# Patient Record
Sex: Female | Born: 1990 | Race: Black or African American | Hispanic: No | Marital: Married | Smoking: Never smoker
Health system: Southern US, Community
[De-identification: ages and names within clinical notes are randomized; demographics above are authoritative.]

## PROBLEM LIST (undated history)

## (undated) DIAGNOSIS — K5900 Constipation, unspecified: Secondary | ICD-10-CM

## (undated) DIAGNOSIS — G5603 Carpal tunnel syndrome, bilateral upper limbs: Secondary | ICD-10-CM

## (undated) DIAGNOSIS — G43019 Migraine without aura, intractable, without status migrainosus: Secondary | ICD-10-CM

## (undated) DIAGNOSIS — E559 Vitamin D deficiency, unspecified: Secondary | ICD-10-CM

## (undated) DIAGNOSIS — I10 Essential (primary) hypertension: Secondary | ICD-10-CM

## (undated) DIAGNOSIS — E282 Polycystic ovarian syndrome: Secondary | ICD-10-CM

## (undated) DIAGNOSIS — F419 Anxiety disorder, unspecified: Secondary | ICD-10-CM

## (undated) DIAGNOSIS — R079 Chest pain, unspecified: Secondary | ICD-10-CM

## (undated) DIAGNOSIS — R87629 Unspecified abnormal cytological findings in specimens from vagina: Secondary | ICD-10-CM

## (undated) DIAGNOSIS — K760 Fatty (change of) liver, not elsewhere classified: Secondary | ICD-10-CM

## (undated) DIAGNOSIS — R600 Localized edema: Secondary | ICD-10-CM

## (undated) DIAGNOSIS — F32A Depression, unspecified: Secondary | ICD-10-CM

## (undated) DIAGNOSIS — R7303 Prediabetes: Secondary | ICD-10-CM

## (undated) DIAGNOSIS — L309 Dermatitis, unspecified: Secondary | ICD-10-CM

## (undated) DIAGNOSIS — L509 Urticaria, unspecified: Secondary | ICD-10-CM

## (undated) DIAGNOSIS — M2550 Pain in unspecified joint: Secondary | ICD-10-CM

## (undated) DIAGNOSIS — K219 Gastro-esophageal reflux disease without esophagitis: Secondary | ICD-10-CM

## (undated) DIAGNOSIS — G4730 Sleep apnea, unspecified: Secondary | ICD-10-CM

## (undated) DIAGNOSIS — R002 Palpitations: Secondary | ICD-10-CM

## (undated) DIAGNOSIS — R0600 Dyspnea, unspecified: Secondary | ICD-10-CM

## (undated) DIAGNOSIS — F1010 Alcohol abuse, uncomplicated: Secondary | ICD-10-CM

## (undated) DIAGNOSIS — F329 Major depressive disorder, single episode, unspecified: Secondary | ICD-10-CM

## (undated) DIAGNOSIS — R632 Polyphagia: Secondary | ICD-10-CM

## (undated) DIAGNOSIS — J45909 Unspecified asthma, uncomplicated: Secondary | ICD-10-CM

## (undated) DIAGNOSIS — M549 Dorsalgia, unspecified: Secondary | ICD-10-CM

## (undated) HISTORY — DX: Polycystic ovarian syndrome: E28.2

## (undated) HISTORY — DX: Polyphagia: R63.2

## (undated) HISTORY — DX: Localized edema: R60.0

## (undated) HISTORY — DX: Alcohol abuse, uncomplicated: F10.10

## (undated) HISTORY — DX: Gastro-esophageal reflux disease without esophagitis: K21.9

## (undated) HISTORY — DX: Dyspnea, unspecified: R06.00

## (undated) HISTORY — DX: Dorsalgia, unspecified: M54.9

## (undated) HISTORY — DX: Constipation, unspecified: K59.00

## (undated) HISTORY — DX: Palpitations: R00.2

## (undated) HISTORY — DX: Sleep apnea, unspecified: G47.30

## (undated) HISTORY — DX: Vitamin D deficiency, unspecified: E55.9

## (undated) HISTORY — DX: Chest pain, unspecified: R07.9

## (undated) HISTORY — DX: Essential (primary) hypertension: I10

## (undated) HISTORY — DX: Pain in unspecified joint: M25.50

## (undated) HISTORY — DX: Unspecified asthma, uncomplicated: J45.909

## (undated) HISTORY — DX: Major depressive disorder, single episode, unspecified: F32.9

## (undated) HISTORY — DX: Migraine without aura, intractable, without status migrainosus: G43.019

## (undated) HISTORY — DX: Urticaria, unspecified: L50.9

## (undated) HISTORY — DX: Fatty (change of) liver, not elsewhere classified: K76.0

## (undated) HISTORY — DX: Prediabetes: R73.03

## (undated) HISTORY — DX: Morbid (severe) obesity due to excess calories: E66.01

## (undated) HISTORY — PX: ADENOIDECTOMY: SUR15

## (undated) HISTORY — DX: Unspecified abnormal cytological findings in specimens from vagina: R87.629

## (undated) HISTORY — DX: Carpal tunnel syndrome, bilateral upper limbs: G56.03

## (undated) HISTORY — DX: Dermatitis, unspecified: L30.9

## (undated) HISTORY — DX: Depression, unspecified: F32.A

## (undated) HISTORY — DX: Anxiety disorder, unspecified: F41.9

---
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
## 2012-11-25 ENCOUNTER — Ambulatory Visit (INDEPENDENT_AMBULATORY_CARE_PROVIDER_SITE_OTHER): Payer: BC Managed Care – PPO | Admitting: Physician Assistant

## 2012-11-25 VITALS — BP 102/80 | HR 90 | Temp 98.2°F | Resp 20 | Ht 65.0 in | Wt >= 6400 oz

## 2012-11-25 DIAGNOSIS — E282 Polycystic ovarian syndrome: Secondary | ICD-10-CM

## 2012-11-25 DIAGNOSIS — N912 Amenorrhea, unspecified: Secondary | ICD-10-CM

## 2012-11-25 DIAGNOSIS — R5383 Other fatigue: Secondary | ICD-10-CM

## 2012-11-25 DIAGNOSIS — G5603 Carpal tunnel syndrome, bilateral upper limbs: Secondary | ICD-10-CM | POA: Insufficient documentation

## 2012-11-25 DIAGNOSIS — M549 Dorsalgia, unspecified: Secondary | ICD-10-CM

## 2012-11-25 DIAGNOSIS — R5381 Other malaise: Secondary | ICD-10-CM

## 2012-11-25 LAB — POCT URINALYSIS DIPSTICK
Nitrite, UA: NEGATIVE
Protein, UA: NEGATIVE
Urobilinogen, UA: 0.2
pH, UA: 6.5

## 2012-11-25 LAB — POCT CBC
Granulocyte percent: 38.4 %G (ref 37–80)
MID (cbc): 0.5 (ref 0–0.9)
MPV: 8.7 fL (ref 0–99.8)
POC Granulocyte: 2.6 (ref 2–6.9)
POC LYMPH PERCENT: 53.6 %L — AB (ref 10–50)
Platelet Count, POC: 249 10*3/uL (ref 142–424)
RBC: 5.65 M/uL — AB (ref 4.04–5.48)
RDW, POC: 14.9 %

## 2012-11-25 LAB — POCT URINE PREGNANCY: Preg Test, Ur: NEGATIVE

## 2012-11-25 LAB — POCT UA - MICROSCOPIC ONLY
Casts, Ur, LPF, POC: NEGATIVE
Yeast, UA: NEGATIVE

## 2012-11-25 NOTE — Patient Instructions (Signed)
Please use ibuprofen 800 mg (you can take 4 of the 200 mg tablets to achieve this dose) up to 3 times daily.  Take them with food to prevent upset stomach.

## 2012-11-25 NOTE — Progress Notes (Signed)
Subjective:    Patient ID: Brandy Saunders, female    DOB: 05-17-1991, 22 y.o.   MRN: [id number]  HPI This 22 y.o. female presents for evaluation of back pain that began yesterday when she was unloading groceries.  The pain came on suddenly, causing her to cry out in pain.  Improved with rest, but then recurred while watching TV.  Took some ibuprofen, which "knocked me out."  Today it's just been achey, but she has also noticed extreme fatigue and SOB.  Mild HA. SOB noted "when I'm relaxing."  Awoke from a nap this afternoon and "felt my heart racing."  Some mild breast tenderness 3 weeks ago.  Intermittent nausea for several months, triggered by smells or tastes. None today.  Has increased her H2O intake and noticed an increase in urinary frequency.  Typically has irregular periods when not taking birth control pills. Previously took metformin for PCOS but didn't tolerate it.   Past Medical History  Diagnosis Date  . Morbidly obese   . Asthma   . Carpal tunnel syndrome, bilateral     Past Surgical History  Procedure Laterality Date  . Tonsilectomy, adenoidectomy, bilateral myringotomy and tubes  1994    Prior to Admission medications   Medication Sig Start Date End Date Taking? Authorizing Provider  ibuprofen (ADVIL,MOTRIN) 400 MG tablet Take 400 mg by mouth every 6 (six) hours as needed for pain.   Yes Historical Provider, MD    No Known Allergies  History   Social History  . Marital Status: Single    Spouse Name: n/a    Number of Children: 0  . Years of Education: 16   Occupational History  . sales    Social History Main Topics  . Smoking status: Former Games developer  . Smokeless tobacco: Never Used  . Alcohol Use: 1.8 oz/week    3 Glasses of wine per week  . Drug Use: Yes    Special: Marijuana     Comment: once every 6 months  . Sexually Active: Yes -- Female partner(s)    Birth Control/ Protection: Condom     Comment: inconsistent condom use   Other Topics  Concern  . Not on file   Social History Narrative   Lives with a roommate.  Parents live in [place], [place].    Family History  Problem Relation Age of Onset  . Hypertension Mother   . Hyperlipidemia Mother   . Stroke Father   . Diabetes Maternal Uncle   . Heart disease Maternal Grandmother       Review of Systems As above.      Objective:   Physical Exam Blood pressure 102/80, pulse 90, temperature 98.2 F (36.8 C), temperature source Oral, resp. rate 20, height 5\' 5"  (1.651 m), weight 401 lb 9.6 oz (182.165 kg), last menstrual period 08/29/2012, SpO2 100.00%. Body mass index is 66.83 kg/(m^2). Well-developed, well nourished morbidly obese BF who is awake, alert and oriented, in NAD. Very talkative, without dyspnea. HEENT: [place]/AT, PERRL, EOMI.  Sclera and conjunctiva are clear.  EAC are patent, TMs are normal in appearance. Nasal mucosa is pink and moist. OP is clear. Neck: supple, non-tender, no lymphadenopathy, thyromegaly. Heart: RRR, no murmur Lungs: normal effort, CTA Back: non-tender, FROM as allowable by body habitus. Extremities: no cyanosis, clubbing or edema. Skin: warm and dry without rash. Psychologic: good mood and appropriate affect, normal speech and behavior.   Results for orders placed in visit on 11/25/12  GLUCOSE, POCT (MANUAL RESULT  ENTRY)      Result Value Range   POC Glucose 69 (*) 70 - 99 mg/dl  POCT CBC      Result Value Range   WBC 6.8  4.6 - 10.2 K/uL   Lymph, poc 3.6 (*) 0.6 - 3.4   POC LYMPH PERCENT 53.6 (*) 10 - 50 %L   MID (cbc) 0.5  0 - 0.9   POC MID % 8.0  0 - 12 %M   POC Granulocyte 2.6  2 - 6.9   Granulocyte percent 38.4  37 - 80 %G   RBC 5.65 (*) 4.04 - 5.48 M/uL   Hemoglobin 144 (*) 12.2 - 16.2 g/dL   HCT, POC 45.4  09.8 - 47.9 %   MCV 82.2  80 - 97 fL   MCH, POC 25.5 (*) 27 - 31.2 pg   MCHC 31.0 (*) 31.8 - 35.4 g/dL   RDW, POC 11.9     Platelet Count, POC 249  142 - 424 K/uL   MPV 8.7  0 - 99.8 fL  POCT URINE PREGNANCY       Result Value Range   Preg Test, Ur Negative    POCT URINALYSIS DIPSTICK      Result Value Range   Color, UA yellow     Clarity, UA clear     Glucose, UA neg     Bilirubin, UA neg     Ketones, UA neg     Spec Grav, UA 1.020     Blood, UA neg     pH, UA 6.5     Protein, UA neg     Urobilinogen, UA 0.2     Nitrite, UA neg     Leukocytes, UA Trace    POCT UA - MICROSCOPIC ONLY      Result Value Range   WBC, Ur, HPF, POC 0-3     RBC, urine, microscopic neg     Bacteria, U Microscopic trace     Mucus, UA trace     Epithelial cells, urine per micros 1-4     Crystals, Ur, HPF, POC neg     Casts, Ur, LPF, POC neg     Yeast, UA neg     Venipuncture unsuccessful x 6.  Labs above obtained by finger stick.  Planned TSH and CMET cancelled due to inadequate sample.    Assessment & Plan:  Acute back pain - Plan: OTC ibuprofen, up to 800 mg TID with meals.  Call if this is inadequate.  Fatigue - Plan: POCT CBC, POCT urinalysis dipstick, POCT UA - Microscopic Only, CANCELED: TSH, CANCELED: Comprehensive metabolic panel; suspect an early viral illness that will progress to reveal additional symptoms over the next 2-3 days.  Amenorrhea/PCOS (polycystic ovarian syndrome) - Plan: POCT urine pregnancy, POCT glucose (manual entry).  Follow-up with GYN as planned.

## 2013-07-03 ENCOUNTER — Other Ambulatory Visit: Payer: Self-pay

## 2013-10-21 ENCOUNTER — Encounter (HOSPITAL_COMMUNITY): Payer: Self-pay | Admitting: Emergency Medicine

## 2013-10-21 ENCOUNTER — Emergency Department (HOSPITAL_COMMUNITY)
Admission: EM | Admit: 2013-10-21 | Discharge: 2013-10-21 | Disposition: A | Discharge status: Home / Self Care | Payer: BC Managed Care – PPO | Attending: Emergency Medicine | Admitting: Emergency Medicine

## 2013-10-21 DIAGNOSIS — Z79899 Other long term (current) drug therapy: Secondary | ICD-10-CM | POA: Insufficient documentation

## 2013-10-21 DIAGNOSIS — R110 Nausea: Secondary | ICD-10-CM | POA: Insufficient documentation

## 2013-10-21 DIAGNOSIS — R209 Unspecified disturbances of skin sensation: Secondary | ICD-10-CM | POA: Insufficient documentation

## 2013-10-21 DIAGNOSIS — R197 Diarrhea, unspecified: Secondary | ICD-10-CM | POA: Insufficient documentation

## 2013-10-21 DIAGNOSIS — K5900 Constipation, unspecified: Secondary | ICD-10-CM | POA: Insufficient documentation

## 2013-10-21 DIAGNOSIS — G5600 Carpal tunnel syndrome, unspecified upper limb: Secondary | ICD-10-CM

## 2013-10-21 DIAGNOSIS — J029 Acute pharyngitis, unspecified: Secondary | ICD-10-CM

## 2013-10-21 DIAGNOSIS — Z87891 Personal history of nicotine dependence: Secondary | ICD-10-CM | POA: Insufficient documentation

## 2013-10-21 DIAGNOSIS — J45909 Unspecified asthma, uncomplicated: Secondary | ICD-10-CM | POA: Insufficient documentation

## 2013-10-21 MED ORDER — HYDROCODONE-ACETAMINOPHEN 5-325 MG PO TABS: 1.0000 | ORAL_TABLET | ORAL | Status: DC | PRN

## 2013-10-21 MED ORDER — PENICILLIN G BENZATHINE 1200000 UNIT/2ML IM SUSP
1.2000 10*6.[IU] | Freq: Once | INTRAMUSCULAR | Status: AC
Start: 2013-10-21 — End: 2013-10-21
  Administered 2013-10-21: 1.2 10*6.[IU] via INTRAMUSCULAR
  Filled 2013-10-21: qty 2

## 2013-10-21 MED ORDER — PREDNISONE 20 MG PO TABS: 40.0000 mg | ORAL_TABLET | Freq: Every day | ORAL | Status: DC

## 2013-10-21 NOTE — ED Notes (Signed)
Pt c/o sore throat since yesterday. Pt states she also has R hand pain due to her carpal tunnel syndrome. Pt states she has also had nausea and dizziness.

## 2013-10-21 NOTE — ED Provider Notes (Signed)
CSN: [id number]     Arrival date & time 10/21/13  1827 History  This chart was scribed for non-physician practitioner Montine Circle PA-C working with Ephraim Hamburger, MD by Adriana Reams, ED Scribe. This patient was seen in room [facility] and the patient's care was started at [facility].  First MD Initiated Contact with Patient 10/21/13 1846     No chief complaint on file.   The history is provided by the patient. No language interpreter was used.   HPI Comments: Brandy Saunders is a 23 y.o. female who presents to the Emergency Department complaining of a few days of sudden onset, gradually worsening sore throat. She reports associated pain with swallowing, subjective fever, chills, nausea, constipation, HA, sinus pressure, and nasal congestion. She has tried Dayquil and Nyquil with little relief. She denies difficulty breathing, difficulty swallowing, coughing, vomiting or any other symptoms.  She also complains of chronic, diagnosed carpel tunnel syndrome in both of her hands that was exacerbated this morning. She states she has an appointment with her MD later this week. She reports numbness and pain shooting up her arm. She has previously tried a 5 day course of Prednisone (prescribed by her PCP) with mild relief. She has tried a sleep splint which she reports makes her pain worse.   She denies a hx of DM.   Past Medical History  Diagnosis Date  . Morbidly obese   . Asthma   . Carpal tunnel syndrome, bilateral    Past Surgical History  Procedure Laterality Date  . Tonsilectomy, adenoidectomy, bilateral myringotomy and tubes  1994   Family History  Problem Relation Age of Onset  . Hypertension Mother   . Hyperlipidemia Mother   . Stroke Father   . Diabetes Maternal Uncle   . Heart disease Maternal Grandmother    History  Substance Use Topics  . Smoking status: Former Research scientist (life sciences)  . Smokeless tobacco: Never Used  . Alcohol Use: 1.8 oz/week    3 Glasses of wine per week    OB History   Grav Para Term Preterm Abortions TAB SAB Ect Mult Living                 Review of Systems  Constitutional: Positive for chills.  HENT: Positive for congestion, sinus pressure and sore throat. Negative for trouble swallowing.   Respiratory: Negative for cough and shortness of breath.   Gastrointestinal: Positive for nausea and diarrhea. Negative for vomiting.  Neurological: Positive for numbness and headaches. Negative for weakness.    Allergies  Review of patient's allergies indicates no known allergies.  Home Medications   Current Outpatient Rx  Name  Route  Sig  Dispense  Refill  . ibuprofen (ADVIL,MOTRIN) 400 MG tablet   Oral   Take 400 mg by mouth every 6 (six) hours as needed for pain.         . Norethindrone Acetate-Ethinyl Estradiol (JUNEL 1.5/30) 1.5-30 MG-MCG tablet   Oral   Take 1 tablet by mouth daily.          There were no vitals taken for this visit.  Physical Exam  Nursing note and vitals reviewed. Constitutional: She is oriented to person, place, and time. She appears well-developed and well-nourished. No distress.  HENT:  Head: Normocephalic and atraumatic.  oropharynx mildly erythematous. No tonsillar exudates. No abscess. Uvula midline. Airway intact.   Eyes: Pupils are equal, round, and reactive to light.  Neck: Normal range of motion.  Cardiovascular: Normal rate, regular  rhythm, normal heart sounds and intact distal pulses.   Pulmonary/Chest: Effort normal and breath sounds normal. No respiratory distress. She has no wheezes. She has no rales. She exhibits no tenderness.  Abdominal: Normal appearance. She exhibits no distension.  Musculoskeletal: Normal range of motion.  Positive Phalen and Tinel signs characteristic of carpel tunnel. No bony abnormality or deformity. ROM, strength and sensation intact.   Neurological: She is alert and oriented to person, place, and time. No cranial nerve deficit.  Skin: Skin is warm and dry. No  rash noted.  Psychiatric: She has a normal mood and affect. Her behavior is normal.    ED Course  Procedures (including critical care time) COORDINATION OF CARE: 6:52 PM Discussed treatment plan which includes Prednisone and Penicillin with pt at bedside and pt agreed to plan. Advised pt to try ice massage. Advised pt to follow up with hand specialist, referral given. Will discharge home with Vicodin.    Labs Review Labs Reviewed - No data to display Imaging Review No results found.  EKG Interpretation   None       MDM   Final diagnoses:  Sore throat  Carpal tunnel syndrome   Patient with sore throat. Possibly strep. Treated in ED. No abscess. Discharged home.  I personally performed the services described in this documentation, which was scribed in my presence. The recorded information has been reviewed and is accurate.     Montine Circle, PA-C 10/21/13 [phone]

## 2013-10-21 NOTE — Discharge Instructions (Signed)
Carpal Tunnel Syndrome The carpal tunnel is a narrow area located on the palm side of your wrist. The tunnel is formed by the wrist bones and ligaments. Nerves, blood vessels, and tendons pass through the carpal tunnel. Repeated wrist motion or certain diseases may cause swelling within the tunnel. This swelling pinches the main nerve in the wrist (median nerve) and causes the painful hand and arm condition called carpal tunnel syndrome. CAUSES   Repeated wrist motions.  Wrist injuries.  Certain diseases like arthritis, diabetes, alcoholism, hyperthyroidism, and kidney failure.  Obesity.  Pregnancy. SYMPTOMS   A "pins and needles" feeling in your fingers or hand.  Tingling or numbness in your fingers or hand.  An aching feeling in your entire arm.  Wrist pain that goes up your arm to your shoulder.  Pain that goes down into your palm or fingers.  A weak feeling in your hands. DIAGNOSIS  Your caregiver will take your history and perform a physical exam. An electromyography test may be needed. This test measures electrical signals sent out by the muscles. The electrical signals are usually slowed by carpal tunnel syndrome. You may also need X-rays. TREATMENT  Carpal tunnel syndrome may clear up by itself. Your caregiver may recommend a wrist splint or medicine such as a nonsteroidal anti-inflammatory medicine. Cortisone injections may help. Sometimes, surgery may be needed to free the pinched nerve.  HOME CARE INSTRUCTIONS   Take all medicine as directed by your caregiver. Only take over-the-counter or prescription medicines for pain, discomfort, or fever as directed by your caregiver.  If you were given a splint to keep your wrist from bending, wear it as directed. It is important to wear the splint at night. Wear the splint for as long as you have pain or numbness in your hand, arm, or wrist. This may take 1 to 2 months.  Rest your wrist from any activity that may be causing your  pain. If your symptoms are work-related, you may need to talk to your employer about changing to a job that does not require using your wrist.  Put ice on your wrist after long periods of wrist activity.  Put ice in a plastic bag.  Place a towel between your skin and the bag.  Leave the ice on for 15-20 minutes, 03-04 times a day.  Keep all follow-up visits as directed by your caregiver. This includes any orthopedic referrals, physical therapy, and rehabilitation. Any delay in getting necessary care could result in a delay or failure of your condition to heal. SEEK IMMEDIATE MEDICAL CARE IF:   You have new, unexplained symptoms.  Your symptoms get worse and are not helped or controlled with medicines. MAKE SURE YOU:   Understand these instructions.  Will watch your condition.  Will get help right away if you are not doing well or get worse. Document Released: 08/11/2000 Document Revised: 11/06/2011 Document Reviewed: 06/30/2011 [facility] Patient Information 2014 [place], [place]. Sore Throat A sore throat is pain, burning, irritation, or scratchiness of the throat. There is often pain or tenderness when swallowing or talking. A sore throat may be accompanied by other symptoms, such as coughing, sneezing, fever, and swollen neck glands. A sore throat is often the first sign of another sickness, such as a cold, flu, strep throat, or mononucleosis (commonly known as mono). Most sore throats go away without medical treatment. CAUSES  The most common causes of a sore throat include:  A viral infection, such as a cold, flu, or mono.  A bacterial infection, such as strep throat, tonsillitis, or whooping cough.  Seasonal allergies.  Dryness in the air.  Irritants, such as smoke or pollution.  Gastroesophageal reflux disease (GERD). HOME CARE INSTRUCTIONS   Only take over-the-counter medicines as directed by your caregiver.  Drink enough fluids to keep your urine clear or pale  yellow.  Rest as needed.  Try using throat sprays, lozenges, or sucking on hard candy to ease any pain (if older than 4 years or as directed).  Sip warm liquids, such as broth, herbal tea, or warm water with honey to relieve pain temporarily. You may also eat or drink cold or frozen liquids such as frozen ice pops.  Gargle with salt water (mix 1 tsp salt with 8 oz of water).  Do not smoke and avoid secondhand smoke.  Put a cool-mist humidifier in your bedroom at night to moisten the air. You can also turn on a hot shower and sit in the bathroom with the door closed for 5 10 minutes. SEEK IMMEDIATE MEDICAL CARE IF:  You have difficulty breathing.  You are unable to swallow fluids, soft foods, or your saliva.  You have increased swelling in the throat.  Your sore throat does not get better in 7 days.  You have nausea and vomiting.  You have a fever or persistent symptoms for more than 2 3 days.  You have a fever and your symptoms suddenly get worse. MAKE SURE YOU:   Understand these instructions.  Will watch your condition.  Will get help right away if you are not doing well or get worse. Document Released: 09/21/2004 Document Revised: 07/31/2012 Document Reviewed: 04/21/2012 [facility] Patient Information 2014 [place], [place].

## 2013-10-22 NOTE — ED Provider Notes (Signed)
Medical screening examination/treatment/procedure(s) were performed by non-physician practitioner and as supervising physician I was immediately available for consultation/collaboration.  EKG Interpretation   None         Xzander Gilham T Magaret Justo, MD 10/22/13 0004 

## 2014-07-30 ENCOUNTER — Ambulatory Visit (INDEPENDENT_AMBULATORY_CARE_PROVIDER_SITE_OTHER): Payer: BC Managed Care – PPO | Admitting: Medical

## 2014-07-30 ENCOUNTER — Encounter: Payer: Self-pay | Admitting: Medical

## 2014-07-30 VITALS — BP 150/95 | HR 102 | Temp 98.1°F | Ht 65.5 in | Wt >= 6400 oz

## 2014-07-30 DIAGNOSIS — R197 Diarrhea, unspecified: Secondary | ICD-10-CM

## 2014-07-30 DIAGNOSIS — R1084 Generalized abdominal pain: Secondary | ICD-10-CM

## 2014-07-30 DIAGNOSIS — R030 Elevated blood-pressure reading, without diagnosis of hypertension: Secondary | ICD-10-CM

## 2014-07-30 DIAGNOSIS — N910 Primary amenorrhea: Secondary | ICD-10-CM

## 2014-07-30 DIAGNOSIS — N926 Irregular menstruation, unspecified: Secondary | ICD-10-CM

## 2014-07-30 DIAGNOSIS — K589 Irritable bowel syndrome without diarrhea: Secondary | ICD-10-CM

## 2014-07-30 DIAGNOSIS — IMO0001 Reserved for inherently not codable concepts without codable children: Secondary | ICD-10-CM

## 2014-07-30 DIAGNOSIS — J45909 Unspecified asthma, uncomplicated: Secondary | ICD-10-CM

## 2014-07-30 LAB — CBC WITH DIFFERENTIAL/PLATELET
BASOS ABS: 0 10*3/uL (ref 0.0–0.1)
Basophils Relative: 0.4 % (ref 0.0–3.0)
EOS ABS: 0.1 10*3/uL (ref 0.0–0.7)
Eosinophils Relative: 1.7 % (ref 0.0–5.0)
HCT: 38.5 % (ref 36.0–46.0)
Hemoglobin: 12.5 g/dL (ref 12.0–15.0)
LYMPHS ABS: 2.2 10*3/uL (ref 0.7–4.0)
LYMPHS PCT: 45.1 % (ref 12.0–46.0)
MCHC: 32.4 g/dL (ref 30.0–36.0)
MCV: 74.9 fl — ABNORMAL LOW (ref 78.0–100.0)
Monocytes Absolute: 0.2 10*3/uL (ref 0.1–1.0)
Monocytes Relative: 5 % (ref 3.0–12.0)
NEUTROS PCT: 47.8 % (ref 43.0–77.0)
Neutro Abs: 2.3 10*3/uL (ref 1.4–7.7)
Platelets: 276 10*3/uL (ref 150.0–400.0)
RBC: 5.14 Mil/uL — ABNORMAL HIGH (ref 3.87–5.11)
RDW: 15.4 % (ref 11.5–15.5)
WBC: 4.9 10*3/uL (ref 4.0–10.5)

## 2014-07-30 LAB — H. PYLORI ANTIBODY, IGG: H PYLORI IGG: NEGATIVE

## 2014-07-30 LAB — POCT URINE PREGNANCY: PREG TEST UR: NEGATIVE

## 2014-07-30 NOTE — Patient Instructions (Signed)
Get a wrist cuff otc and check bp daily. Give [place] call in 1 wk with those values. If any cardiac or neuro signs/symptoms then ED eval.  For your abdomen pain and varying loose/constipated stools will advise. Metamucil 1 round tablespoon in 8 oz water 3 times a day.  Get labs today.  Follow up in 2 wks bp check and bring in your wrist monitor.

## 2014-07-30 NOTE — Assessment & Plan Note (Signed)
Pt arm is so large that the large cuff did not fit around her arm. I checked her bp with large cuff around forearm. I got 150/90. Difficult to check. Get a wrist cuff otc and check bp daily. Give [place] call in 1 wk with those values. If any cardiac or neuro signs/symptoms then ED eval.  Follow up in 2 wks bp check and bring in your wrist monitor.

## 2014-07-30 NOTE — Assessment & Plan Note (Signed)
1st visit here Document today work up and consider plan weight loss in future.

## 2014-07-30 NOTE — Progress Notes (Signed)
Pre visit review using our clinic review tool, if applicable. No additional management support is needed unless otherwise documented below in the visit note. 

## 2014-07-30 NOTE — Assessment & Plan Note (Signed)
Stable for years.

## 2014-07-30 NOTE — Progress Notes (Signed)
Subjective:    Patient ID: Brandy Saunders, female    DOB: 1991/03/13, 23 y.o.   MRN: [id number]  HPI   I have reviewed pt PMH, PSH, FH, Social History and Surgical History  Customer care call center, cup coffee a day. NO exercise, single. Bachelors of science.  Pt here for first time. Pt states her bp are controlled with other providers. Pt has some occasional HA and thinks this is related to her computer work. No chest pain or other neurologic type symptoms other than rare occasional faint ha.  Asthma controlled last problem with that was since childhood.  Pt just describes mild abdominal cramps on and off. Loose stools about 1/2 hour after eating.(thentother times  mild constipated) This has been going on since September. Hx of mild lactose intolerance. Some nausea with this occasionally  but usually never vomited. Did vomit one time last week. Occasional heartburn. None presently. LMP- June. Always irregular. Her pregnancy test was negative.  Past Medical History  Diagnosis Date  . Morbidly obese   . Asthma   . Carpal tunnel syndrome, bilateral     History   Social History  . Marital Status: Single    Spouse Name: n/a    Number of Children: 0  . Years of Education: 16   Occupational History  . sales    Social History Main Topics  . Smoking status: Former Research scientist (life sciences)  . Smokeless tobacco: Never Used  . Alcohol Use: 1.8 oz/week    3 Glasses of wine per week  . Drug Use: Yes    Special: Marijuana     Comment: once every 6 months  . Sexual Activity:    Partners: Male    Birth Control/ Protection: Condom     Comment: inconsistent condom use   Other Topics Concern  . Not on file   Social History Narrative   Lives with a roommate.  Parents live in [place], [place].    Past Surgical History  Procedure Laterality Date  . Tonsilectomy, adenoidectomy, bilateral myringotomy and tubes  1994    Family History  Problem Relation Age of Onset  . Hypertension Mother   .  Hyperlipidemia Mother   . Stroke Father   . Diabetes Maternal Uncle   . Heart disease Maternal Grandmother     No Known Allergies  Current Outpatient Prescriptions on File Prior to Visit  Medication Sig Dispense Refill  . ibuprofen (ADVIL,MOTRIN) 400 MG tablet Take 400 mg by mouth every 6 (six) hours as needed for pain.     No current facility-administered medications on file prior to visit.    BP 150/95 mmHg  Pulse 102  Temp(Src) 98.1 F (36.7 C) (Oral)  Ht 5' 5.5" (1.664 m)  Wt 439 lb (199.129 kg)  BMI 71.92 kg/m2  SpO2 100%  LMP 01/28/2014        Review of Systems See hipi    Objective:   Physical Exam   General Appearance- Not in acute distress.  HEENT Eyes- Scleraeral/Conjuntiva-bilat- Not Yellow. Mouth & Throat- Normal.  Chest and Lung Exam Auscultation: Breath sounds:-Normal. Adventitious sounds:- No Adventitious sounds.  Cardiovascular Auscultation:Rythm - Regular. Heart Sounds -Normal heart sounds.  Abdomen Inspection:-Inspection Normal.  Palpation/Perucssion: Palpation and Percussion of the abdomen reveal- Non Tender, No Rebound tenderness, No rigidity(Guarding) and No Palpable abdominal masses.  Liver:-Normal.  Spleen:- Normal.    Neurologic Cranial Nerve exam:- CN III-XII intact(No nystagmus), symmetric smile. Drift Test:- No drift. Romberg Exam:- Negative.  Finger to Nose:-  Normal/Intact Strength:- 5/5 equal and symmetric strength both upper and lower extremities.         Assessment & Plan:

## 2014-07-30 NOTE — Assessment & Plan Note (Signed)
For your abdomen pain and varying loose/constipated stools will advise. Metamucil 1 round tablespoon in 8 oz water 3 times a day.  Get labs today.  Expand work up  If labs negative and pt symptoms persist.

## 2014-07-30 NOTE — Assessment & Plan Note (Signed)
Pregnancy neg. Likely pcos related.

## 2014-08-01 LAB — COMPREHENSIVE METABOLIC PANEL
ALT: 52 U/L — AB (ref 0–35)
AST: 38 U/L — ABNORMAL HIGH (ref 0–37)
Albumin: 3.9 g/dL (ref 3.5–5.2)
Alkaline Phosphatase: 60 U/L (ref 39–117)
BILIRUBIN TOTAL: 0.6 mg/dL (ref 0.2–1.2)
BUN: 10 mg/dL (ref 6–23)
CO2: 25 mEq/L (ref 19–32)
Calcium: 8.5 mg/dL (ref 8.4–10.5)
Chloride: 103 mEq/L (ref 96–112)
Creatinine, Ser: 0.6 mg/dL (ref 0.4–1.2)
GFR: 148.89 mL/min (ref >60.00)
Glucose, Bld: 93 mg/dL (ref 70–99)
Potassium: 4.3 mEq/L (ref 3.5–5.1)
Sodium: 139 mEq/L (ref 135–145)
TOTAL PROTEIN: 7.3 g/dL (ref 6.0–8.3)

## 2014-08-13 ENCOUNTER — Ambulatory Visit: Payer: BC Managed Care – PPO | Admitting: Medical

## 2014-08-14 LAB — POC HEMOCCULT BLD/STL (HOME/3-CARD/SCREEN)
FECAL OCCULT BLD: NEGATIVE
FECAL OCCULT BLD: NEGATIVE
Fecal Occult Blood, POC: NEGATIVE

## 2014-08-14 NOTE — Addendum Note (Signed)
Addended by: Peggyann Shoals on: 08/14/2014 03:11 PM   Modules accepted: Orders

## 2014-08-17 LAB — OVA AND PARASITE EXAMINATION: OP: NONE SEEN

## 2014-08-25 LAB — CLOSTRIDIUM DIFFICILE CULTURE-FECAL

## 2014-08-27 ENCOUNTER — Telehealth: Payer: Self-pay | Admitting: Medical

## 2014-08-27 ENCOUNTER — Encounter: Payer: Self-pay | Admitting: Physician Assistant

## 2014-08-27 DIAGNOSIS — R195 Other fecal abnormalities: Secondary | ICD-10-CM

## 2014-08-27 DIAGNOSIS — R109 Unspecified abdominal pain: Secondary | ICD-10-CM

## 2014-08-27 NOTE — Telephone Encounter (Signed)
error:[id number] ° °

## 2014-08-27 NOTE — Telephone Encounter (Signed)
Referal to GI for abdominal cramps. Loose stools and yeast on stool studies.

## 2014-09-04 ENCOUNTER — Encounter: Payer: Self-pay | Admitting: Physician Assistant

## 2014-09-04 ENCOUNTER — Ambulatory Visit (INDEPENDENT_AMBULATORY_CARE_PROVIDER_SITE_OTHER): Payer: BLUE CROSS/BLUE SHIELD | Admitting: Physician Assistant

## 2014-09-04 ENCOUNTER — Other Ambulatory Visit (INDEPENDENT_AMBULATORY_CARE_PROVIDER_SITE_OTHER): Payer: BLUE CROSS/BLUE SHIELD

## 2014-09-04 VITALS — BP 130/70 | HR 80 | Ht 65.5 in | Wt >= 6400 oz

## 2014-09-04 DIAGNOSIS — R945 Abnormal results of liver function studies: Secondary | ICD-10-CM

## 2014-09-04 DIAGNOSIS — K219 Gastro-esophageal reflux disease without esophagitis: Secondary | ICD-10-CM

## 2014-09-04 DIAGNOSIS — K589 Irritable bowel syndrome without diarrhea: Secondary | ICD-10-CM

## 2014-09-04 DIAGNOSIS — R197 Diarrhea, unspecified: Secondary | ICD-10-CM

## 2014-09-04 DIAGNOSIS — R7989 Other specified abnormal findings of blood chemistry: Secondary | ICD-10-CM

## 2014-09-04 LAB — CBC WITH DIFFERENTIAL/PLATELET
Basophils Absolute: 0 K/uL (ref 0.0–0.1)
Basophils Relative: 0.8 % (ref 0.0–3.0)
Eosinophils Absolute: 0.3 K/uL (ref 0.0–0.7)
Eosinophils Relative: 6.4 % — ABNORMAL HIGH (ref 0.0–5.0)
HCT: 39.3 % (ref 36.0–46.0)
Hemoglobin: 12.9 g/dL (ref 12.0–15.0)
Lymphocytes Relative: 48.9 % — ABNORMAL HIGH (ref 12.0–46.0)
Lymphs Abs: 2.3 K/uL (ref 0.7–4.0)
MCHC: 32.8 g/dL (ref 30.0–36.0)
MCV: 75 fl — ABNORMAL LOW (ref 78.0–100.0)
Monocytes Absolute: 0.2 K/uL (ref 0.1–1.0)
Monocytes Relative: 4.8 % (ref 3.0–12.0)
Neutro Abs: 1.9 K/uL (ref 1.4–7.7)
Neutrophils Relative %: 39.1 % — ABNORMAL LOW (ref 43.0–77.0)
Platelets: 271 K/uL (ref 150.0–400.0)
RBC: 5.24 Mil/uL — ABNORMAL HIGH (ref 3.87–5.11)
RDW: 15.8 % — ABNORMAL HIGH (ref 11.5–15.5)
WBC: 4.8 K/uL (ref 4.0–10.5)

## 2014-09-04 LAB — COMPREHENSIVE METABOLIC PANEL WITH GFR
ALT: 50 U/L — ABNORMAL HIGH (ref 0–35)
AST: 34 U/L (ref 0–37)
Albumin: 3.9 g/dL (ref 3.5–5.2)
Alkaline Phosphatase: 61 U/L (ref 39–117)
BUN: 12 mg/dL (ref 6–23)
CO2: 29 meq/L (ref 19–32)
Calcium: 8.7 mg/dL (ref 8.4–10.5)
Chloride: 105 meq/L (ref 96–112)
Creatinine, Ser: 0.7 mg/dL (ref 0.4–1.2)
GFR: 136.22 mL/min
Glucose, Bld: 95 mg/dL (ref 70–99)
Potassium: 4.5 meq/L (ref 3.5–5.1)
Sodium: 139 meq/L (ref 135–145)
Total Bilirubin: 0.4 mg/dL (ref 0.2–1.2)
Total Protein: 7.7 g/dL (ref 6.0–8.3)

## 2014-09-04 LAB — AMYLASE: AMYLASE: 25 U/L — AB (ref 27–131)

## 2014-09-04 LAB — LIPASE: Lipase: 34 U/L (ref 11.0–59.0)

## 2014-09-04 MED ORDER — PANTOPRAZOLE SODIUM 40 MG PO TBEC: DELAYED_RELEASE_TABLET | ORAL | Status: DC

## 2014-09-04 MED ORDER — DICYCLOMINE HCL 20 MG PO TABS: 20.0000 mg | ORAL_TABLET | Freq: Three times a day (TID) | ORAL | Status: DC

## 2014-09-04 NOTE — Progress Notes (Signed)
Patient ID: [name], female   DOB: July 17, 1991, 24 y.o.   MRN: [id number]    HPI:    Brandy Saunders is a 24 year old female referred for evaluation by Brandy Pai, PA-C due to diarrhea and GERD.  Patient states that since September 2015 she has been having postprandial cramping and the need to have a bowel movement. She reports that she will eat, developed lower abdominal cramping, and have a bowel movement, sometimes formed and sometimes loose. She is unable to identify any specific foods that intensify her symptoms and she has not been able to identify anything that alleviates her symptoms. Her lower abdominal pain is described as crampy in nature and about a 5 on a scale of 1-10 in intensity. Her pain is better when she has a bowel movement. She typically has days of formed stools alternating with days of loose stools. She states she is puzzled by her new symptoms because she has not changed her eating habits. She reports that on Thanksgiving she had her regular Thanksgiving dinner and later that night he had pizza and wine and subsequently developed cramping and diarrhea. She reports that her stools have been oily. She has had no bright red blood per rectum or melena. Her appetite is good and there is been no weight loss. There is no known family history of colon cancer, colon polyps, or inflammatory bowel disease.  She also reports that for the past several months she has been getting heartburn for 5 times per day. Her heartburn seems to be worse if she has tomatoes, tomato sauce, pizza, or citrus products. She has been waking up at night because she feels mouthfuls of regurgitation coming up and feels like she is choking. She has a bitter taste in her mouth every morning. She has tried using baking soda and water with little relief of her symptoms. She has also tried toms with little relief. She admits that she has an Affinity for chocolate, fried food, and ice cream and acknowledges  that these all make her heartburn worse. Dietary review reveals that the patient typically has something from [place] for breakfast, chicken tenders and fries for lunch, and her suppe's vary from day to day. She has no dysphagia, epigastric pain, nausea, or vomiting. She was recently evaluated at her primary care physician's office and noted to have elevated transaminases as well.   Past Medical History  Diagnosis Date  . Morbidly obese   . Asthma   . Carpal tunnel syndrome, bilateral     Past Surgical History  Procedure Laterality Date  . Tonsilectomy, adenoidectomy, bilateral myringotomy and tubes  1994   Family History  Problem Relation Age of Onset  . Hypertension Mother   . Hyperlipidemia Mother   . Stroke Father   . Diabetes Maternal Uncle   . Heart disease Maternal Grandmother   . Colon cancer Neg Hx    History  Substance Use Topics  . Smoking status: Former Smoker    Types: Cigarettes  . Smokeless tobacco: Never Used  . Alcohol Use: 1.8 oz/week    3 Glasses of wine per week   Current Outpatient Prescriptions  Medication Sig Dispense Refill  . ibuprofen (ADVIL,MOTRIN) 400 MG tablet Take 400 mg by mouth every 6 (six) hours as needed for pain.    . Melatonin 3 MG CAPS Take 3 mg by mouth at bedtime as needed.    Marland Kitchen PE-DM-APAP & Doxylamin-DM-APAP (LIQUID) MISC Take by mouth. PRN currently for cold. Also doing  the Nyquil liquid.    Marland Kitchen dicyclomine (BENTYL) 20 MG tablet Take 1 tablet (20 mg total) by mouth 3 (three) times daily before meals. 90 tablet 1  . pantoprazole (PROTONIX) 40 MG tablet Take 1 tab 30 min before breakfast 30 tablet 3   No current facility-administered medications for this visit.   No Known Allergies   Review of Systems: Gen: Denies any fever, chills, sweats, anorexia, fatigue, weakness, malaise, weight loss, and sleep disorder CV: Denies chest pain, angina, palpitations, syncope, orthopnea, PND, peripheral edema, and claudication. Resp: Has some  dyspnea with exertion. GI: Denies vomiting blood, jaundice, and fecal incontinence.   Denies dysphagia or odynophagia. GU : Denies urinary burning, blood in urine, urinary frequency, urinary hesitancy, nocturnal urination, and urinary incontinence. MS: Denies joint pain, limitation of movement, and swelling, stiffness, low back pain, extremity pain. Denies muscle weakness, cramps, atrophy.  Derm: Denies rash, itching, dry skin, hives, moles, warts, or unhealing ulcers.  Psych: Denies depression, anxiety, memory loss, suicidal ideation, hallucinations, paranoia, and confusion. Heme: Denies bruising, bleeding, and enlarged lymph nodes. Neuro:  Denies any headaches, dizziness, paresthesias. Endo:  Denies any problems with DM, thyroid, adrenal function   LAB RESULTS: CBC on 07/30/2014 had a white blood cell count 4.9, hemoglobin 12.5, hematocrit 38.5, platelets 276,000. MCV 74.9. MS 3 profile on 07/30/2014 showed an alkaline phosphatase of 60, albumin 3.9, AST 38, ALT 52, total protein 7.3, total bili 0.6. Stool for fecal occult blood on 08/12/2014 was negative 3.    Physical Exam: BP 130/70 mmHg  Pulse 80  Ht 5' 5.5" (1.664 m)  Wt 433 lb 3.2 oz (196.498 kg)  BMI 70.97 kg/m2 Constitutional: Pleasant,well-developed, obese, AA female in no acute distress. HEENT: Normocephalic and atraumatic. Conjunctivae are normal. No scleral icterus. Neck supple. No thyrpmegaly Cardiovascular: Normal rate, regular rhythm.  Pulmonary/chest: Effort normal and breath sounds normal. No wheezing, rales or rhonchi. Abdominal: Soft, nondistended, nontender. Bowel sounds active throughout. There are no masses palpable. No hepatomegaly. Extremities: no edema Lymphadenopathy: No cervical adenopathy noted. Neurological: Alert and oriented to person place and time. Skin: Skin is warm and dry. No rashes noted. Psychiatric: Normal mood and affect. Behavior is normal.  ASSESSMENT AND PLAN: #1 GERD. An antireflux  regimen has been reviewed. She will be given a trial of pantoprazole 40 mg by mouth every morning 30 minutes prior to breakfast.  #2. IBS/diarrhea. Her symptoms of postprandial cramping followed by defecation with relief of her discomfort with defecation are suggestive of IBS. He spent a considerable amount of time reviewing a high-fiber low-fat diet. She will try Benefiber 1 heaping tablespoon daily along with Bentyl 20 mg 1 by mouth 3 times a day when necessary cramping. Repeat CBC, comprehensive metabolic panel, amylase, lipase, celiac panel, GI pathogen panel and fecal fat will be obtained. If her blood work is nonrevealing and she has no relief of her discomfort with dietary changes and use of antispasmodics, she will be considered for colonoscopy.  #3. Elevated AST and ALT. These are likely due to fatty liver. The patient does admit to having a glass of wine daily but states she can discontinue this practice at any time. An ultrasound of the abdomen will be obtained to evaluate for fatty liver.  Patient will follow up in 6 weeks, sooner if needed.    Sharilynn Cassity, Vita Barley PA-C 09/04/2014, 11:27 AM

## 2014-09-04 NOTE — Patient Instructions (Addendum)
Please go to the basement level to have your labs drawn and stool study. We sent prescriptions to [facility]. 1. Pantoprazole sodium 40 mg. 2. Bentyl ( Dicyclomine) 20 mg.  You have been scheduled for an abdominal ultrasound at [facility] (1st floor of hospital) on Tuesday 09-08-2014 at 8:00 . Please arrive at 7:45 am  prior to your appointment for registration. Make certain not to have anything to eat or drink 6 hours prior to your appointment. Should you need to reschedule your appointment, please contact radiology at [phone]. This test typically takes about 30 minutes to perform.  We have given  You information on Anti-reflux, High fiber diet, Low fat diet.  Get Benefiber at [facility] or [facility].  Take 1 heaping tablesppon in water or juice daily.  Call us end of January to see if Lori's schedule is in yet.  If it is make an appointment to see Cecille Rubin week of 2-15 or week of 2-22.

## 2014-09-04 NOTE — Progress Notes (Signed)
I agree with the above note, plan 

## 2014-09-08 ENCOUNTER — Other Ambulatory Visit: Payer: Self-pay | Admitting: Physician Assistant

## 2014-09-08 ENCOUNTER — Other Ambulatory Visit: Payer: Self-pay | Admitting: *Deleted

## 2014-09-08 ENCOUNTER — Other Ambulatory Visit: Payer: BLUE CROSS/BLUE SHIELD

## 2014-09-08 ENCOUNTER — Ambulatory Visit (HOSPITAL_COMMUNITY)
Admission: RE | Admit: 2014-09-08 | Discharge: 2014-09-08 | Disposition: A | Discharge status: Home / Self Care | Payer: BLUE CROSS/BLUE SHIELD | Source: Ambulatory Visit | Attending: Physician Assistant | Admitting: Physician Assistant

## 2014-09-08 DIAGNOSIS — R945 Abnormal results of liver function studies: Secondary | ICD-10-CM | POA: Insufficient documentation

## 2014-09-08 DIAGNOSIS — K760 Fatty (change of) liver, not elsewhere classified: Secondary | ICD-10-CM

## 2014-09-08 DIAGNOSIS — R7989 Other specified abnormal findings of blood chemistry: Secondary | ICD-10-CM

## 2014-09-08 DIAGNOSIS — R197 Diarrhea, unspecified: Secondary | ICD-10-CM

## 2014-09-08 DIAGNOSIS — K219 Gastro-esophageal reflux disease without esophagitis: Secondary | ICD-10-CM

## 2014-09-08 DIAGNOSIS — K589 Irritable bowel syndrome without diarrhea: Secondary | ICD-10-CM

## 2014-09-10 LAB — FECAL LACTOFERRIN, QUANT: Lactoferrin: NEGATIVE

## 2014-09-11 LAB — GASTROINTESTINAL PATHOGEN PANEL PCR
C. DIFFICILE TOX A/B, PCR: NEGATIVE
CRYPTOSPORIDIUM, PCR: NEGATIVE
Campylobacter, PCR: NEGATIVE
E COLI 0157, PCR: NEGATIVE
E coli (ETEC) LT/ST PCR: NEGATIVE
E coli (STEC) stx1/stx2, PCR: NEGATIVE
GIARDIA LAMBLIA, PCR: NEGATIVE
NOROVIRUS, PCR: NEGATIVE
Rotavirus A, PCR: NEGATIVE
Salmonella, PCR: NEGATIVE
Shigella, PCR: NEGATIVE

## 2014-09-17 ENCOUNTER — Encounter: Payer: BLUE CROSS/BLUE SHIELD | Attending: Physician Assistant | Admitting: Dietician

## 2014-09-17 ENCOUNTER — Encounter: Payer: Self-pay | Admitting: Dietician

## 2014-09-17 VITALS — Ht 66.0 in | Wt >= 6400 oz

## 2014-09-17 DIAGNOSIS — Z6841 Body Mass Index (BMI) 40.0 and over, adult: Secondary | ICD-10-CM | POA: Diagnosis not present

## 2014-09-17 DIAGNOSIS — E282 Polycystic ovarian syndrome: Secondary | ICD-10-CM

## 2014-09-17 DIAGNOSIS — K589 Irritable bowel syndrome without diarrhea: Secondary | ICD-10-CM | POA: Insufficient documentation

## 2014-09-17 DIAGNOSIS — K760 Fatty (change of) liver, not elsewhere classified: Secondary | ICD-10-CM | POA: Diagnosis not present

## 2014-09-17 LAB — FECAL FAT, QUANTITATIVE

## 2014-09-17 NOTE — Progress Notes (Signed)
Medical Nutrition Therapy:  Appt start time: 0900 end time:  1000.   Assessment:  Primary concerns today: Patient wants to see if dietary habits are contributing to her stomach problems.  Her focus is not weight control but health. Fatty liver per MD.  Morbidly obese and hx of PCOS.  Patient was put on Metformin for PCOS but did not like the side effects.  Weight was more steady until new job a year ago and sits a lot.  Patient has noted weight gain.  There is a gym on work site and also has a Building services engineer and also another Building services engineer but does not use.  Knows she should but motivation is difficult.  Boyfriend has diabetes and is somewhat supportive, is very accepting of her size but does not understand always the need for her to change her eating habits.  Patient states that she is an emotional eater.  There are always a lot of snacks at the office.  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  Patient is here alone.    Patient states that she is lactose intolerant.  Has changed to [place] but uses other dairy despite the side effects.  Her heartburn and diarrhea have improved and patient did not take the prescribed meds due to never making time to get to the pharmacy.  She has decreased the fat in the diet as well which has had a positive outcome on her GI symptoms.    Preferred Learning Style:   No preference indicated   Learning Readiness:   Contemplating  Ready  Change in progress   MEDICATIONS: see list   DIETARY INTAKE:  Eats a lot of dairy.  Increased dairy causes diarrhea, gas,-  Patient states that she is lactose intolerant.  Patient has changed to Almond milk and continues to eat other dairy items despite symptoms.  24-hr recall: up until 1 month ago B ( 10 or 11AM):  [place] or pasta or fried chicken at [employer] Snk ( AM):   L ( PM):  Snk ( PM): fruit or other things at work D ( PM): [place], or fast food or other  take out Snk ( PM): popcorn or chips or fruit or granola when she gets home. Beverages: soda, juice, wine (prior to the New Year and none since) Patient has been drinking more water  In the past month has started preparing more food at home and trying to eat more healthfully.  Usual physical activity: none  Estimated energy needs: 1600 calories 180 g carbohydrates 100 g protein 53 g fat  Progress Towards Goal(s):  In progress.   Nutritional Diagnosis:  NB-1.1 Food and nutrition-related knowledge deficit As related to balance of carbohydrates, protein, and fat.  As evidenced by diet hx.    Intervention:  Nutrition Counseling.  Discussed being more mindful of eating habits, effects of nutrition on liver, PCOS and health.  Suggested a more whole foods/low glycemic diet.  Continue the Almond milk.  -gave non dairy options for cooking.   Consider exercise.  Walking 15 minutes 3-4 x per week and go from there. Rethink your drinks.  Great job with the changes made! Think about how you feel when snacking. (am I hungry?)  Teaching Method Utilized:  Visual Auditory Hands on  Handouts given during visit include:  Yellow carbohydrate card  Label reading  Reference for Mindless Eating book  Barriers to learning/adherence to lifestyle change: motivation/support.  Demonstrated degree of understanding via:  Teach Back   Monitoring/Evaluation:  Dietary intake, exercise, label reading, and body weight in 1 month(s). \

## 2014-09-17 NOTE — Patient Instructions (Signed)
Continue the Almond milk.   Consider exercise.  Walking 15 minutes 3-4 x per week and go from there. Rethink your drinks.  Great job with the changes made! Think about how you feel when snacking. (am I hungry?)

## 2014-09-22 ENCOUNTER — Other Ambulatory Visit: Payer: Self-pay

## 2014-09-22 DIAGNOSIS — R197 Diarrhea, unspecified: Secondary | ICD-10-CM

## 2014-09-23 ENCOUNTER — Telehealth: Payer: Self-pay | Admitting: *Deleted

## 2014-09-23 ENCOUNTER — Encounter: Payer: Self-pay | Admitting: *Deleted

## 2014-09-23 NOTE — Telephone Encounter (Signed)
Pre- Visit Call:   Reviewed allergies, medications, and health history with patient and updated as needed.   Patient would like to discuss how to get her menstrual cycle more regular.   No other questions of concerns at this time.    Pap: would like tomorrow Flu- declined Td- 07/30/2008  Preferred Pharmacy: [id number] - [place], [place]

## 2014-09-24 ENCOUNTER — Encounter: Payer: Self-pay | Admitting: Family Medicine

## 2014-09-24 ENCOUNTER — Other Ambulatory Visit (HOSPITAL_COMMUNITY)
Admission: RE | Admit: 2014-09-24 | Discharge: 2014-09-24 | Disposition: A | Discharge status: Home / Self Care | Payer: BLUE CROSS/BLUE SHIELD | Source: Ambulatory Visit | Attending: Family Medicine | Admitting: Family Medicine

## 2014-09-24 ENCOUNTER — Ambulatory Visit (INDEPENDENT_AMBULATORY_CARE_PROVIDER_SITE_OTHER): Payer: BLUE CROSS/BLUE SHIELD | Admitting: Family Medicine

## 2014-09-24 VITALS — HR 91 | Temp 98.4°F | Ht 66.0 in | Wt >= 6400 oz

## 2014-09-24 DIAGNOSIS — Z01419 Encounter for gynecological examination (general) (routine) without abnormal findings: Secondary | ICD-10-CM | POA: Diagnosis not present

## 2014-09-24 DIAGNOSIS — Z1151 Encounter for screening for human papillomavirus (HPV): Secondary | ICD-10-CM | POA: Insufficient documentation

## 2014-09-24 DIAGNOSIS — E282 Polycystic ovarian syndrome: Secondary | ICD-10-CM

## 2014-09-24 DIAGNOSIS — Z0000 Encounter for general adult medical examination without abnormal findings: Secondary | ICD-10-CM

## 2014-09-24 DIAGNOSIS — Z124 Encounter for screening for malignant neoplasm of cervix: Secondary | ICD-10-CM

## 2014-09-24 MED ORDER — NORETHIN ACE-ETH ESTRAD-FE 1-20 MG-MCG PO TABS: 1.0000 | ORAL_TABLET | Freq: Every day | ORAL | Status: DC

## 2014-09-24 NOTE — Assessment & Plan Note (Signed)
Pt did not tolerate metformin Will restart bcp and refer back to endo

## 2014-09-24 NOTE — Patient Instructions (Addendum)
Preventive Care for Adults A healthy lifestyle and preventive care can promote health and wellness. Preventive health guidelines for women include the following key practices.  A routine yearly physical is a good way to check with your health care provider about your health and preventive screening. It is a chance to share any concerns and updates on your health and to receive a thorough exam.  Visit your dentist for a routine exam and preventive care every 6 months. Brush your teeth twice a day and floss once a day. Good oral hygiene prevents tooth decay and gum disease.  The frequency of eye exams is based on your age, health, family medical history, use of contact lenses, and other factors. Follow your health care provider's recommendations for frequency of eye exams.  Eat a healthy diet. Foods like vegetables, fruits, whole grains, low-fat dairy products, and lean protein foods contain the nutrients you need without too many calories. Decrease your intake of foods high in solid fats, added sugars, and salt. Eat the right amount of calories for you.Get information about a proper diet from your health care provider, if necessary.  Regular physical exercise is one of the most important things you can do for your health. Most adults should get at least 150 minutes of moderate-intensity exercise (any activity that increases your heart rate and causes you to sweat) each week. In addition, most adults need muscle-strengthening exercises on 2 or more days a week.  Maintain a healthy weight. The body mass index (BMI) is a screening tool to identify possible weight problems. It provides an estimate of body fat based on height and weight. Your health care provider can find your BMI and can help you achieve or maintain a healthy weight.For adults 20 years and older:  A BMI below 18.5 is considered underweight.  A BMI of 18.5 to 24.9 is normal.  A BMI of 25 to 29.9 is considered overweight.  A BMI of  30 and above is considered obese.  Maintain normal blood lipids and cholesterol levels by exercising and minimizing your intake of saturated fat. Eat a balanced diet with plenty of fruit and vegetables. Blood tests for lipids and cholesterol should begin at age 76 and be repeated every 5 years. If your lipid or cholesterol levels are high, you are over 50, or you are at high risk for heart disease, you may need your cholesterol levels checked more frequently.Ongoing high lipid and cholesterol levels should be treated with medicines if diet and exercise are not working.  If you smoke, find out from your health care provider how to quit. If you do not use tobacco, do not start.  Lung cancer screening is recommended for adults aged 22-80 years who are at high risk for developing lung cancer because of a history of smoking. A yearly low-dose CT scan of the lungs is recommended for people who have at least a 30-pack-year history of smoking and are a current smoker or have quit within the past 15 years. A pack year of smoking is smoking an average of 1 pack of cigarettes a day for 1 year (for example: 1 pack a day for 30 years or 2 packs a day for 15 years). Yearly screening should continue until the smoker has stopped smoking for at least 15 years. Yearly screening should be stopped for people who develop a health problem that would prevent them from having lung cancer treatment.  If you are pregnant, do not drink alcohol. If you are breastfeeding,  be very cautious about drinking alcohol. If you are not pregnant and choose to drink alcohol, do not have more than 1 drink per day. One drink is considered to be 12 ounces (355 mL) of beer, 5 ounces (148 mL) of wine, or 1.5 ounces (44 mL) of liquor.  Avoid use of street drugs. Do not share needles with anyone. Ask for help if you need support or instructions about stopping the use of drugs.  High blood pressure causes heart disease and increases the risk of  stroke. Your blood pressure should be checked at least every 1 to 2 years. Ongoing high blood pressure should be treated with medicines if weight loss and exercise do not work.  If you are 75-52 years old, ask your health care provider if you should take aspirin to prevent strokes.  Diabetes screening involves taking a blood sample to check your fasting blood sugar level. This should be done once every 3 years, after age 15, if you are within normal weight and without risk factors for diabetes. Testing should be considered at a younger age or be carried out more frequently if you are overweight and have at least 1 risk factor for diabetes.  Breast cancer screening is essential preventive care for women. You should practice "breast self-awareness." This means understanding the normal appearance and feel of your breasts and may include breast self-examination. Any changes detected, no matter how small, should be reported to a health care provider. Women in their 58s and 30s should have a clinical breast exam (CBE) by a health care provider as part of a regular health exam every 1 to 3 years. After age 16, women should have a CBE every year. Starting at age 53, women should consider having a mammogram (breast X-ray test) every year. Women who have a family history of breast cancer should talk to their health care provider about genetic screening. Women at a high risk of breast cancer should talk to their health care providers about having an MRI and a mammogram every year.  Breast cancer gene (BRCA)-related cancer risk assessment is recommended for women who have family members with BRCA-related cancers. BRCA-related cancers include breast, ovarian, tubal, and peritoneal cancers. Having family members with these cancers may be associated with an increased risk for harmful changes (mutations) in the breast cancer genes BRCA1 and BRCA2. Results of the assessment will determine the need for genetic counseling and  BRCA1 and BRCA2 testing.  Routine pelvic exams to screen for cancer are no longer recommended for nonpregnant women who are considered low risk for cancer of the pelvic organs (ovaries, uterus, and vagina) and who do not have symptoms. Ask your health care provider if a screening pelvic exam is right for you.  If you have had past treatment for cervical cancer or a condition that could lead to cancer, you need Pap tests and screening for cancer for at least 20 years after your treatment. If Pap tests have been discontinued, your risk factors (such as having a new sexual partner) need to be reassessed to determine if screening should be resumed. Some women have medical problems that increase the chance of getting cervical cancer. In these cases, your health care provider may recommend more frequent screening and Pap tests.  The HPV test is an additional test that may be used for cervical cancer screening. The HPV test looks for the virus that can cause the cell changes on the cervix. The cells collected during the Pap test can be  tested for HPV. The HPV test could be used to screen women aged 30 years and older, and should be used in women of any age who have unclear Pap test results. After the age of 30, women should have HPV testing at the same frequency as a Pap test.  Colorectal cancer can be detected and often prevented. Most routine colorectal cancer screening begins at the age of 50 years and continues through age 75 years. However, your health care provider may recommend screening at an earlier age if you have risk factors for colon cancer. On a yearly basis, your health care provider may provide home test kits to check for hidden blood in the stool. Use of a small camera at the end of a tube, to directly examine the colon (sigmoidoscopy or colonoscopy), can detect the earliest forms of colorectal cancer. Talk to your health care provider about this at age 50, when routine screening begins. Direct  exam of the colon should be repeated every 5-10 years through age 75 years, unless early forms of pre-cancerous polyps or small growths are found.  People who are at an increased risk for hepatitis B should be screened for this virus. You are considered at high risk for hepatitis B if:  You were born in a country where hepatitis B occurs often. Talk with your health care provider about which countries are considered high risk.  Your parents were born in a high-risk country and you have not received a shot to protect against hepatitis B (hepatitis B vaccine).  You have HIV or AIDS.  You use needles to inject street drugs.  You live with, or have sex with, someone who has hepatitis B.  You get hemodialysis treatment.  You take certain medicines for conditions like cancer, organ transplantation, and autoimmune conditions.  Hepatitis C blood testing is recommended for all people born from 1945 through 1965 and any individual with known risks for hepatitis C.  Practice safe sex. Use condoms and avoid high-risk sexual practices to reduce the spread of sexually transmitted infections (STIs). STIs include gonorrhea, chlamydia, syphilis, trichomonas, herpes, HPV, and human immunodeficiency virus (HIV). Herpes, HIV, and HPV are viral illnesses that have no cure. They can result in disability, cancer, and death.  You should be screened for sexually transmitted illnesses (STIs) including gonorrhea and chlamydia if:  You are sexually active and are younger than 24 years.  You are older than 24 years and your health care provider tells you that you are at risk for this type of infection.  Your sexual activity has changed since you were last screened and you are at an increased risk for chlamydia or gonorrhea. Ask your health care provider if you are at risk.  If you are at risk of being infected with HIV, it is recommended that you take a prescription medicine daily to prevent HIV infection. This is  called preexposure prophylaxis (PrEP). You are considered at risk if:  You are a heterosexual woman, are sexually active, and are at increased risk for HIV infection.  You take drugs by injection.  You are sexually active with a partner who has HIV.  Talk with your health care provider about whether you are at high risk of being infected with HIV. If you choose to begin PrEP, you should first be tested for HIV. You should then be tested every 3 months for as long as you are taking PrEP.  Osteoporosis is a disease in which the bones lose minerals and strength   with aging. This can result in serious bone fractures or breaks. The risk of osteoporosis can be identified using a bone density scan. Women ages 65 years and over and women at risk for fractures or osteoporosis should discuss screening with their health care providers. Ask your health care provider whether you should take a calcium supplement or vitamin D to reduce the rate of osteoporosis.  Menopause can be associated with physical symptoms and risks. Hormone replacement therapy is available to decrease symptoms and risks. You should talk to your health care provider about whether hormone replacement therapy is right for you.  Use sunscreen. Apply sunscreen liberally and repeatedly throughout the day. You should seek shade when your shadow is shorter than you. Protect yourself by wearing long sleeves, pants, a wide-brimmed hat, and sunglasses year round, whenever you are outdoors.  Once a month, do a whole body skin exam, using a mirror to look at the skin on your back. Tell your health care provider of new moles, moles that have irregular borders, moles that are larger than a pencil eraser, or moles that have changed in shape or color.  Stay current with required vaccines (immunizations).  Influenza vaccine. All adults should be immunized every year.  Tetanus, diphtheria, and acellular pertussis (Td, Tdap) vaccine. Pregnant women should  receive 1 dose of Tdap vaccine during each pregnancy. The dose should be obtained regardless of the length of time since the last dose. Immunization is preferred during the 27th-36th week of gestation. An adult who has not previously received Tdap or who does not know her vaccine status should receive 1 dose of Tdap. This initial dose should be followed by tetanus and diphtheria toxoids (Td) booster doses every 10 years. Adults with an unknown or incomplete history of completing a 3-dose immunization series with Td-containing vaccines should begin or complete a primary immunization series including a Tdap dose. Adults should receive a Td booster every 10 years.  Varicella vaccine. An adult without evidence of immunity to varicella should receive 2 doses or a second dose if she has previously received 1 dose. Pregnant females who do not have evidence of immunity should receive the first dose after pregnancy. This first dose should be obtained before leaving the health care facility. The second dose should be obtained 4-8 weeks after the first dose.  Human papillomavirus (HPV) vaccine. Females aged 13-26 years who have not received the vaccine previously should obtain the 3-dose series. The vaccine is not recommended for use in pregnant females. However, pregnancy testing is not needed before receiving a dose. If a female is found to be pregnant after receiving a dose, no treatment is needed. In that case, the remaining doses should be delayed until after the pregnancy. Immunization is recommended for any person with an immunocompromised condition through the age of 26 years if she did not get any or all doses earlier. During the 3-dose series, the second dose should be obtained 4-8 weeks after the first dose. The third dose should be obtained 24 weeks after the first dose and 16 weeks after the second dose.  Zoster vaccine. One dose is recommended for adults aged 60 years or older unless certain conditions are  present.  Measles, mumps, and rubella (MMR) vaccine. Adults born before 1957 generally are considered immune to measles and mumps. Adults born in 1957 or later should have 1 or more doses of MMR vaccine unless there is a contraindication to the vaccine or there is laboratory evidence of immunity to   each of the three diseases. A routine second dose of MMR vaccine should be obtained at least 28 days after the first dose for students attending postsecondary schools, health care workers, or international travelers. People who received inactivated measles vaccine or an unknown type of measles vaccine during 1963-1967 should receive 2 doses of MMR vaccine. People who received inactivated mumps vaccine or an unknown type of mumps vaccine before 1979 and are at high risk for mumps infection should consider immunization with 2 doses of MMR vaccine. For females of childbearing age, rubella immunity should be determined. If there is no evidence of immunity, females who are not pregnant should be vaccinated. If there is no evidence of immunity, females who are pregnant should delay immunization until after pregnancy. Unvaccinated health care workers born before 1957 who lack laboratory evidence of measles, mumps, or rubella immunity or laboratory confirmation of disease should consider measles and mumps immunization with 2 doses of MMR vaccine or rubella immunization with 1 dose of MMR vaccine.  Pneumococcal 13-valent conjugate (PCV13) vaccine. When indicated, a person who is uncertain of her immunization history and has no record of immunization should receive the PCV13 vaccine. An adult aged 19 years or older who has certain medical conditions and has not been previously immunized should receive 1 dose of PCV13 vaccine. This PCV13 should be followed with a dose of pneumococcal polysaccharide (PPSV23) vaccine. The PPSV23 vaccine dose should be obtained at least 8 weeks after the dose of PCV13 vaccine. An adult aged 19  years or older who has certain medical conditions and previously received 1 or more doses of PPSV23 vaccine should receive 1 dose of PCV13. The PCV13 vaccine dose should be obtained 1 or more years after the last PPSV23 vaccine dose.  Pneumococcal polysaccharide (PPSV23) vaccine. When PCV13 is also indicated, PCV13 should be obtained first. All adults aged 65 years and older should be immunized. An adult younger than age 65 years who has certain medical conditions should be immunized. Any person who resides in a nursing home or long-term care facility should be immunized. An adult smoker should be immunized. People with an immunocompromised condition and certain other conditions should receive both PCV13 and PPSV23 vaccines. People with human immunodeficiency virus (HIV) infection should be immunized as soon as possible after diagnosis. Immunization during chemotherapy or radiation therapy should be avoided. Routine use of PPSV23 vaccine is not recommended for American Indians, [place] Natives, or people younger than 65 years unless there are medical conditions that require PPSV23 vaccine. When indicated, people who have unknown immunization and have no record of immunization should receive PPSV23 vaccine. One-time revaccination 5 years after the first dose of PPSV23 is recommended for people aged 19-64 years who have chronic kidney failure, nephrotic syndrome, asplenia, or immunocompromised conditions. People who received 1-2 doses of PPSV23 before age 65 years should receive another dose of PPSV23 vaccine at age 65 years or later if at least 5 years have passed since the previous dose. Doses of PPSV23 are not needed for people immunized with PPSV23 at or after age 65 years.  Meningococcal vaccine. Adults with asplenia or persistent complement component deficiencies should receive 2 doses of quadrivalent meningococcal conjugate (MenACWY-D) vaccine. The doses should be obtained at least 2 months apart.  Microbiologists working with certain meningococcal bacteria, military recruits, people at risk during an outbreak, and people who travel to or live in countries with a high rate of meningitis should be immunized. A first-year college student up through age   21 years who is living in a residence hall should receive a dose if she did not receive a dose on or after her 16th birthday. Adults who have certain high-risk conditions should receive one or more doses of vaccine.  Hepatitis A vaccine. Adults who wish to be protected from this disease, have certain high-risk conditions, work with hepatitis A-infected animals, work in hepatitis A research labs, or travel to or work in countries with a high rate of hepatitis A should be immunized. Adults who were previously unvaccinated and who anticipate close contact with an international adoptee during the first 60 days after arrival in the [place] from a country with a high rate of hepatitis A should be immunized.  Hepatitis B vaccine. Adults who wish to be protected from this disease, have certain high-risk conditions, may be exposed to blood or other infectious body fluids, are household contacts or sex partners of hepatitis B positive people, are clients or workers in certain care facilities, or travel to or work in countries with a high rate of hepatitis B should be immunized.  Haemophilus influenzae type b (Hib) vaccine. A previously unvaccinated person with asplenia or sickle cell disease or having a scheduled splenectomy should receive 1 dose of Hib vaccine. Regardless of previous immunization, a recipient of a hematopoietic stem cell transplant should receive a 3-dose series 6-12 months after her successful transplant. Hib vaccine is not recommended for adults with HIV infection. Preventive Services / Frequency Ages 64 to 68 years  Blood pressure check.** / Every 1 to 2 years.  Lipid and cholesterol check.** / Every 5 years beginning at age  22.  Clinical breast exam.** / Every 3 years for women in their 88s and 53s.  BRCA-related cancer risk assessment.** / For women who have family members with a BRCA-related cancer (breast, ovarian, tubal, or peritoneal cancers).  Pap test.** / Every 2 years from ages 90 through 51. Every 3 years starting at age 21 through age 56 or 3 with a history of 3 consecutive normal Pap tests.  HPV screening.** / Every 3 years from ages 24 through ages 1 to 46 with a history of 3 consecutive normal Pap tests.  Hepatitis C blood test.** / For any individual with known risks for hepatitis C.  Skin self-exam. / Monthly.  Influenza vaccine. / Every year.  Tetanus, diphtheria, and acellular pertussis (Tdap, Td) vaccine.** / Consult your health care provider. Pregnant women should receive 1 dose of Tdap vaccine during each pregnancy. 1 dose of Td every 10 years.  Varicella vaccine.** / Consult your health care provider. Pregnant females who do not have evidence of immunity should receive the first dose after pregnancy.  HPV vaccine. / 3 doses over 6 months, if 72 and younger. The vaccine is not recommended for use in pregnant females. However, pregnancy testing is not needed before receiving a dose.  Measles, mumps, rubella (MMR) vaccine.** / You need at least 1 dose of MMR if you were born in 1957 or later. You may also need a 2nd dose. For females of childbearing age, rubella immunity should be determined. If there is no evidence of immunity, females who are not pregnant should be vaccinated. If there is no evidence of immunity, females who are pregnant should delay immunization until after pregnancy.  Pneumococcal 13-valent conjugate (PCV13) vaccine.** / Consult your health care provider.  Pneumococcal polysaccharide (PPSV23) vaccine.** / 1 to 2 doses if you smoke cigarettes or if you have certain conditions.  Meningococcal vaccine.** /  1 dose if you are age 19 to 21 years and a first-year college  student living in a residence hall, or have one of several medical conditions, you need to get vaccinated against meningococcal disease. You may also need additional booster doses.  Hepatitis A vaccine.** / Consult your health care provider.  Hepatitis B vaccine.** / Consult your health care provider.  Haemophilus influenzae type b (Hib) vaccine.** / Consult your health care provider. Ages 40 to 64 years  Blood pressure check.** / Every 1 to 2 years.  Lipid and cholesterol check.** / Every 5 years beginning at age 20 years.  Lung cancer screening. / Every year if you are aged 55-80 years and have a 30-pack-year history of smoking and currently smoke or have quit within the past 15 years. Yearly screening is stopped once you have quit smoking for at least 15 years or develop a health problem that would prevent you from having lung cancer treatment.  Clinical breast exam.** / Every year after age 40 years.  BRCA-related cancer risk assessment.** / For women who have family members with a BRCA-related cancer (breast, ovarian, tubal, or peritoneal cancers).  Mammogram.** / Every year beginning at age 40 years and continuing for as long as you are in good health. Consult with your health care provider.  Pap test.** / Every 3 years starting at age 30 years through age 65 or 70 years with a history of 3 consecutive normal Pap tests.  HPV screening.** / Every 3 years from ages 30 years through ages 65 to 70 years with a history of 3 consecutive normal Pap tests.  Fecal occult blood test (FOBT) of stool. / Every year beginning at age 50 years and continuing until age 75 years. You may not need to do this test if you get a colonoscopy every 10 years.  Flexible sigmoidoscopy or colonoscopy.** / Every 5 years for a flexible sigmoidoscopy or every 10 years for a colonoscopy beginning at age 50 years and continuing until age 75 years.  Hepatitis C blood test.** / For all people born from 1945 through  1965 and any individual with known risks for hepatitis C.  Skin self-exam. / Monthly.  Influenza vaccine. / Every year.  Tetanus, diphtheria, and acellular pertussis (Tdap/Td) vaccine.** / Consult your health care provider. Pregnant women should receive 1 dose of Tdap vaccine during each pregnancy. 1 dose of Td every 10 years.  Varicella vaccine.** / Consult your health care provider. Pregnant females who do not have evidence of immunity should receive the first dose after pregnancy.  Zoster vaccine.** / 1 dose for adults aged 60 years or older.  Measles, mumps, rubella (MMR) vaccine.** / You need at least 1 dose of MMR if you were born in 1957 or later. You may also need a 2nd dose. For females of childbearing age, rubella immunity should be determined. If there is no evidence of immunity, females who are not pregnant should be vaccinated. If there is no evidence of immunity, females who are pregnant should delay immunization until after pregnancy.  Pneumococcal 13-valent conjugate (PCV13) vaccine.** / Consult your health care provider.  Pneumococcal polysaccharide (PPSV23) vaccine.** / 1 to 2 doses if you smoke cigarettes or if you have certain conditions.  Meningococcal vaccine.** / Consult your health care provider.  Hepatitis A vaccine.** / Consult your health care provider.  Hepatitis B vaccine.** / Consult your health care provider.  Haemophilus influenzae type b (Hib) vaccine.** / Consult your health care provider. Ages 65   years and over  Blood pressure check.** / Every 1 to 2 years.  Lipid and cholesterol check.** / Every 5 years beginning at age 28 years.  Lung cancer screening. / Every year if you are aged 55-80 years and have a 30-pack-year history of smoking and currently smoke or have quit within the past 15 years. Yearly screening is stopped once you have quit smoking for at least 15 years or develop a health problem that would prevent you from having lung cancer  treatment.  Clinical breast exam.** / Every year after age 70 years.  BRCA-related cancer risk assessment.** / For women who have family members with a BRCA-related cancer (breast, ovarian, tubal, or peritoneal cancers).  Mammogram.** / Every year beginning at age 57 years and continuing for as long as you are in good health. Consult with your health care provider.  Pap test.** / Every 3 years starting at age 37 years through age 64 or 10 years with 3 consecutive normal Pap tests. Testing can be stopped between 65 and 70 years with 3 consecutive normal Pap tests and no abnormal Pap or HPV tests in the past 10 years.  HPV screening.** / Every 3 years from ages 33 years through ages 80 or 33 years with a history of 3 consecutive normal Pap tests. Testing can be stopped between 65 and 70 years with 3 consecutive normal Pap tests and no abnormal Pap or HPV tests in the past 10 years.  Fecal occult blood test (FOBT) of stool. / Every year beginning at age 70 years and continuing until age 5 years. You may not need to do this test if you get a colonoscopy every 10 years.  Flexible sigmoidoscopy or colonoscopy.** / Every 5 years for a flexible sigmoidoscopy or every 10 years for a colonoscopy beginning at age 86 years and continuing until age 54 years.  Hepatitis C blood test.** / For all people born from [birth year] through 1966 and any individual with known risks for hepatitis C.  Osteoporosis screening.** / A one-time screening for women ages 66 years and over and women at risk for fractures or osteoporosis.  Skin self-exam. / Monthly.  Influenza vaccine. / Every year.  Tetanus, diphtheria, and acellular pertussis (Tdap/Td) vaccine.** / 1 dose of Td every 10 years.  Varicella vaccine.** / Consult your health care provider.  Zoster vaccine.** / 1 dose for adults aged 41 years or older.  Pneumococcal 13-valent conjugate (PCV13) vaccine.** / Consult your health care provider.  Pneumococcal  polysaccharide (PPSV23) vaccine.** / 1 dose for all adults aged 19 years and older.  Meningococcal vaccine.** / Consult your health care provider.  Hepatitis A vaccine.** / Consult your health care provider.  Hepatitis B vaccine.** / Consult your health care provider.  Haemophilus influenzae type b (Hib) vaccine.** / Consult your health care provider. ** Family history and personal history of risk and conditions may change your health care provider's recommendations. Document Released: 10/10/2001 Document Revised: 12/29/2013 Document Reviewed: 01/09/2011 [facility] Patient Information 2015 [place], [place]. This information is not intended to replace advice given to you by your health care provider. Make sure you discuss any questions you have with your health care provider.   Polycystic Ovarian Syndrome Polycystic ovarian syndrome (PCOS) is a common hormonal disorder among women of reproductive age. Most women with PCOS grow many small cysts on their ovaries. PCOS can cause problems with your periods and make it difficult to get pregnant. It can also cause an increased risk of miscarriage with  pregnancy. If left untreated, PCOS can lead to serious health problems, such as diabetes and heart disease. CAUSES The cause of PCOS is not fully understood, but genetics may be a factor. SIGNS AND SYMPTOMS   Infrequent or no menstrual periods.   Inability to get pregnant (infertility) because of not ovulating.   Increased growth of hair on the face, chest, stomach, back, thumbs, thighs, or toes.   Acne, oily skin, or dandruff.   Pelvic pain.   Weight gain or obesity, usually carrying extra weight around the waist.   Type 2 diabetes.   High cholesterol.   High blood pressure.   Female-pattern baldness or thinning hair.   Patches of thickened and dark brown or black skin on the neck, arms, breasts, or thighs.   Tiny excess flaps of skin (skin tags) in the armpits or neck area.    Excessive snoring and having breathing stop at times while asleep (sleep apnea).   Deepening of the voice.   Gestational diabetes when pregnant.  DIAGNOSIS  There is no single test to diagnose PCOS.   Your health care provider will:   Take a medical history.   Perform a pelvic exam.   Have ultrasonography done.   Check your female and female hormone levels.   Measure glucose or sugar levels in the blood.   Do other blood tests.   If you are producing too many female hormones, your health care provider will make sure it is from PCOS. At the physical exam, your health care provider will want to evaluate the areas of increased hair growth. Try to allow natural hair growth for a few days before the visit.   During a pelvic exam, the ovaries may be enlarged or swollen because of the increased number of small cysts. This can be seen more easily by using vaginal ultrasonography or screening to examine the ovaries and lining of the uterus (endometrium) for cysts. The uterine lining may become thicker if you have not been having a regular period.  TREATMENT  Because there is no cure for PCOS, it needs to be managed to prevent problems. Treatments are based on your symptoms. Treatment is also based on whether you want to have a baby or whether you need contraception.  Treatment may include:   Progesterone hormone to start a menstrual period.   Birth control pills to make you have regular menstrual periods.   Medicines to make you ovulate, if you want to get pregnant.   Medicines to control your insulin.   Medicine to control your blood pressure.   Medicine and diet to control your high cholesterol and triglycerides in your blood.  Medicine to reduce excessive hair growth.  Surgery, making small holes in the ovary, to decrease the amount of female hormone production. This is done through a long, lighted tube (laparoscope) placed into the pelvis through a tiny incision  in the lower abdomen.  HOME CARE INSTRUCTIONS  Only take over-the-counter or prescription medicine as directed by your health care provider.  Pay attention to the foods you eat and your activity levels. This can help reduce the effects of PCOS.  Keep your weight under control.  Eat foods that are low in carbohydrate and high in fiber.  Exercise regularly. SEEK MEDICAL CARE IF:  Your symptoms do not get better with medicine.  You have new symptoms. Document Released: 12/08/2004 Document Revised: 06/04/2013 Document Reviewed: 01/30/2013 [facility] Patient Information 2015 [place], [place]. This information is not intended to replace advice  given to you by your health care provider. Make sure you discuss any questions you have with your health care provider.  """"""""""""""""""""""""

## 2014-09-24 NOTE — Progress Notes (Signed)
Pre visit review using our clinic review tool, if applicable. No additional management support is needed unless otherwise documented below in the visit note. 

## 2014-09-24 NOTE — Progress Notes (Signed)
Subjective:     Brandy Saunders is a 24 y.o. female and is here for a comprehensive physical exam. The patient reports problems - pcos, irregular periods and stomach issues ( she is seeing GI).  Marland Kitchen  History   Social History  . Marital Status: Single    Spouse Name: n/a    Number of Children: 0  . Years of Education: 16   Occupational History  . sales time Forensic scientist    Social History Main Topics  . Smoking status: Former Smoker    Types: Cigarettes  . Smokeless tobacco: Never Used  . Alcohol Use: 1.8 oz/week    3 Glasses of wine per week  . Drug Use: Yes    Special: Marijuana     Comment: once every 6 months  . Sexual Activity:    Partners: Male    Birth Control/ Protection: Condom     Comment: inconsistent condom use   Other Topics Concern  . Not on file   Social History Narrative   Lives with a roommate.  Parents live in [place], [place].   Health Maintenance  Topic Date Due  . PAP SMEAR  04/01/2008  . INFLUENZA VACCINE  05/13/2015 (Originally 03/28/2014)  . TETANUS/TDAP  07/30/2018    The following portions of the patient's history were reviewed and updated as appropriate:  She  has a past medical history of Morbidly obese; Asthma; Carpal tunnel syndrome, bilateral; and PCOS (polycystic ovarian syndrome). She  does not have any pertinent problems on file. She  has past surgical history that includes Tonsilectomy, adenoidectomy, bilateral myringotomy and tubes (1994). Her family history includes Diabetes in her maternal uncle; Heart disease in her maternal grandmother; Hyperlipidemia in her mother; Hypertension in her mother; Stroke in her father. There is no history of Colon cancer. She  reports that she has quit smoking. Her smoking use included Cigarettes. She has never used smokeless tobacco. She reports that she drinks about 1.8 oz of alcohol per week. She reports that she uses illicit drugs (Marijuana). She has a current medication list which includes the following  prescription(s): melatonin and norethindrone-ethinyl estradiol. Current Outpatient Prescriptions on File Prior to Visit  Medication Sig Dispense Refill  . Melatonin 3 MG CAPS Take 3 mg by mouth at bedtime as needed.     No current facility-administered medications on file prior to visit.   She is allergic to metformin and related..  Review of Systems Review of Systems  Constitutional: Negative for activity change, appetite change and fatigue.  HENT: Negative for hearing loss, congestion, tinnitus and ear discharge.  dentist q67m Eyes: Negative for visual disturbance (see optho q1y -- vision corrected to 20/20 with glasses).  Respiratory: Negative for cough, chest tightness and shortness of breath.   Cardiovascular: Negative for chest pain, palpitations and leg swelling.  Gastrointestinal: Negative for abdominal pain, diarrhea, constipation and abdominal distention.  Genitourinary: Negative for urgency, frequency, decreased urine volume and difficulty urinating.  Musculoskeletal: Negative for back pain, arthralgias and gait problem.  Skin: Negative for color change, pallor and rash.  Neurological: Negative for dizziness, light-headedness, numbness and headaches.  Hematological: Negative for adenopathy. Does not bruise/bleed easily.  Psychiatric/Behavioral: Negative for suicidal ideas, confusion, sleep disturbance, self-injury, dysphoric mood, decreased concentration and agitation.       Objective:    Pulse 91  Temp(Src) 98.4 F (36.9 C) (Oral)  Ht 5\' 6"  (1.676 m)  Wt 432 lb (195.954 kg)  BMI 69.76 kg/m2  SpO2 98%  LMP  General appearance: alert, cooperative, appears stated age and morbidly obese Head: Normocephalic, without obvious abnormality, atraumatic Eyes: conjunctivae/corneas clear. PERRL, EOM's intact. Fundi benign. Ears: normal TM's and external ear canals both ears Nose: Nares normal. Septum midline. Mucosa normal. No drainage or sinus tenderness. Throat: lips,  mucosa, and tongue normal; teeth and gums normal Neck: no adenopathy, no carotid bruit, no JVD, supple, symmetrical, trachea midline and thyroid not enlarged, symmetric, no tenderness/mass/nodules Back: symmetric, no curvature. ROM normal. No CVA tenderness. Lungs: clear to auscultation bilaterally Breasts: normal appearance, no masses or tenderness Heart: regular rate and rhythm, S1, S2 normal, no murmur, click, rub or gallop Abdomen: soft, non-tender; bowel sounds normal; no masses,  no organomegaly Pelvic: cervix normal in appearance, external genitalia normal, no adnexal masses or tenderness, no cervical motion tenderness, rectovaginal septum normal, uterus normal size, shape, and consistency, vagina normal without discharge and pap done Extremities: extremities normal, atraumatic, no cyanosis or edema Pulses: 2+ and symmetric Skin: Skin color, texture, turgor normal. No rashes or lesions Lymph nodes: Cervical, supraclavicular, and axillary nodes normal. Neurologic: Alert and oriented X 3, normal strength and tone. Normal symmetric reflexes. Normal coordination and gait Psych-- no depression, no anxiety        Assessment:    Healthy female exam.      Plan:     ghm utd Check labs See After Visit Summary for Counseling Recommendations    1. Preventative health care   - Basic metabolic panel - CBC with Differential/Platelet - Hepatic function panel - Lipid panel - POCT urinalysis dipstick - TSH  2. PCOS (polycystic ovarian syndrome)   - Ambulatory referral to Endocrinology - norethindrone-ethinyl estradiol (JUNEL FE 1/20) 1-20 MG-MCG tablet; Take 1 tablet by mouth daily.  Dispense: 1 Package; Refill: 11  3. Morbid obesity Pt seeing nutritionist

## 2014-09-28 LAB — COMPREHENSIVE METABOLIC PANEL
ALT: 51 U/L — ABNORMAL HIGH (ref 0–35)
AST: 37 U/L (ref 0–37)
Albumin: 3.8 g/dL (ref 3.5–5.2)
Alkaline Phosphatase: 59 U/L (ref 39–117)
BILIRUBIN TOTAL: 0.5 mg/dL (ref 0.2–1.2)
BUN: 10 mg/dL (ref 6–23)
CALCIUM: 9 mg/dL (ref 8.4–10.5)
CO2: 26 mEq/L (ref 19–32)
Chloride: 102 mEq/L (ref 96–112)
Creat: 0.67 mg/dL (ref 0.50–1.10)
GLUCOSE: 100 mg/dL — AB (ref 70–99)
POTASSIUM: 4.5 meq/L (ref 3.5–5.3)
Sodium: 135 mEq/L (ref 135–145)
TOTAL PROTEIN: 6.8 g/dL (ref 6.0–8.3)

## 2014-09-28 LAB — HEPATIC FUNCTION PANEL
ALT: 51 U/L — ABNORMAL HIGH (ref 0–35)
AST: 37 U/L (ref 0–37)
Albumin: 3.8 g/dL (ref 3.5–5.2)
Alkaline Phosphatase: 59 U/L (ref 39–117)
BILIRUBIN DIRECT: 0.1 mg/dL (ref 0.0–0.3)
BILIRUBIN TOTAL: 0.5 mg/dL (ref 0.2–1.2)
Indirect Bilirubin: 0.4 mg/dL (ref 0.2–1.2)
Total Protein: 6.8 g/dL (ref 6.0–8.3)

## 2014-09-28 LAB — CBC WITH DIFFERENTIAL/PLATELET
Basophils Absolute: 0 10*3/uL (ref 0.0–0.1)
Basophils Relative: 0 % (ref 0–1)
Eosinophils Absolute: 0.1 10*3/uL (ref 0.0–0.7)
Eosinophils Relative: 3 % (ref 0–5)
HEMATOCRIT: 39.5 % (ref 36.0–46.0)
Hemoglobin: 12.6 g/dL (ref 12.0–15.0)
LYMPHS ABS: 2.4 10*3/uL (ref 0.7–4.0)
LYMPHS PCT: 50 % — AB (ref 12–46)
MCH: 24.5 pg — ABNORMAL LOW (ref 26.0–34.0)
MCHC: 31.9 g/dL (ref 30.0–36.0)
MCV: 76.8 fL — AB (ref 78.0–100.0)
MPV: 9.5 fL (ref 8.6–12.4)
Monocytes Absolute: 0.2 10*3/uL (ref 0.1–1.0)
Monocytes Relative: 5 % (ref 3–12)
NEUTROS ABS: 2 10*3/uL (ref 1.7–7.7)
Neutrophils Relative %: 42 % — ABNORMAL LOW (ref 43–77)
Platelets: 259 10*3/uL (ref 150–400)
RBC: 5.14 MIL/uL — ABNORMAL HIGH (ref 3.87–5.11)
RDW: 15.1 % (ref 11.5–15.5)
WBC: 4.7 10*3/uL (ref 4.0–10.5)

## 2014-09-28 LAB — CYTOLOGY - PAP

## 2014-09-28 LAB — LIPID PANEL
CHOLESTEROL: 154 mg/dL (ref 0–200)
HDL: 49 mg/dL (ref >39)
LDL CALC: 80 mg/dL (ref 0–99)
Total CHOL/HDL Ratio: 3.1 Ratio
Triglycerides: 124 mg/dL (ref <150)
VLDL: 25 mg/dL (ref 0–40)

## 2014-09-28 LAB — TSH: TSH: 2.147 u[IU]/mL (ref 0.350–4.500)

## 2014-10-05 ENCOUNTER — Encounter: Payer: Self-pay | Admitting: Internal Medicine

## 2014-10-05 ENCOUNTER — Ambulatory Visit (INDEPENDENT_AMBULATORY_CARE_PROVIDER_SITE_OTHER): Payer: BLUE CROSS/BLUE SHIELD | Admitting: Internal Medicine

## 2014-10-05 VITALS — BP 118/68 | HR 95 | Temp 98.7°F | Resp 16 | Ht 66.0 in | Wt >= 6400 oz

## 2014-10-05 DIAGNOSIS — E282 Polycystic ovarian syndrome: Secondary | ICD-10-CM

## 2014-10-05 LAB — HEMOGLOBIN A1C: Hgb A1c MFr Bld: 6.3 % (ref 4.6–6.5)

## 2014-10-05 NOTE — Patient Instructions (Addendum)
Please start the birth control pill.  Please look at the following website: [email or web address]  Please stop at the lab.  Please come back for a follow-up appointment in 6 months.  Polycystic Ovarian Syndrome Polycystic ovarian syndrome (PCOS) is a common hormonal disorder among women of reproductive age. Most women with PCOS grow many small cysts on their ovaries. PCOS can cause problems with your periods and make it difficult to get pregnant. It can also cause an increased risk of miscarriage with pregnancy. If left untreated, PCOS can lead to serious health problems, such as diabetes and heart disease. CAUSES The cause of PCOS is not fully understood, but genetics may be a factor. SIGNS AND SYMPTOMS   Infrequent or no menstrual periods.   Inability to get pregnant (infertility) because of not ovulating.   Increased growth of hair on the face, chest, stomach, back, thumbs, thighs, or toes.   Acne, oily skin, or dandruff.   Pelvic pain.   Weight gain or obesity, usually carrying extra weight around the waist.   Type 2 diabetes.   High cholesterol.   High blood pressure.   Female-pattern baldness or thinning hair.   Patches of thickened and dark brown or black skin on the neck, arms, breasts, or thighs.   Tiny excess flaps of skin (skin tags) in the armpits or neck area.   Excessive snoring and having breathing stop at times while asleep (sleep apnea).   Deepening of the voice.   Gestational diabetes when pregnant.  DIAGNOSIS  There is no single test to diagnose PCOS.   Your health care provider will:   Take a medical history.   Perform a pelvic exam.   Have ultrasonography done.   Check your female and female hormone levels.   Measure glucose or sugar levels in the blood.   Do other blood tests.   If you are producing too many female hormones, your health care provider will make sure it is from PCOS. At the physical exam, your health care  provider will want to evaluate the areas of increased hair growth. Try to allow natural hair growth for a few days before the visit.   During a pelvic exam, the ovaries may be enlarged or swollen because of the increased number of small cysts. This can be seen more easily by using vaginal ultrasonography or screening to examine the ovaries and lining of the uterus (endometrium) for cysts. The uterine lining may become thicker if you have not been having a regular period.  TREATMENT  Because there is no cure for PCOS, it needs to be managed to prevent problems. Treatments are based on your symptoms. Treatment is also based on whether you want to have a baby or whether you need contraception.  Treatment may include:   Progesterone hormone to start a menstrual period.   Birth control pills to make you have regular menstrual periods.   Medicines to make you ovulate, if you want to get pregnant.   Medicines to control your insulin.   Medicine to control your blood pressure.   Medicine and diet to control your high cholesterol and triglycerides in your blood.  Medicine to reduce excessive hair growth.  Surgery, making small holes in the ovary, to decrease the amount of female hormone production. This is done through a long, lighted tube (laparoscope) placed into the pelvis through a tiny incision in the lower abdomen.  HOME CARE INSTRUCTIONS  Only take over-the-counter or prescription medicine as directed by your health care  provider.  Pay attention to the foods you eat and your activity levels. This can help reduce the effects of PCOS.  Keep your weight under control.  Eat foods that are low in carbohydrate and high in fiber.  Exercise regularly. SEEK MEDICAL CARE IF:  Your symptoms do not get better with medicine.  You have new symptoms. Document Released: 12/08/2004 Document Revised: 06/04/2013 Document Reviewed: 01/30/2013 [facility] Patient Information 2015 [place], [place].  This information is not intended to replace advice given to you by your health care provider. Make sure you discuss any questions you have with your health care provider.

## 2014-10-05 NOTE — Progress Notes (Signed)
Patient ID: Brandy Saunders, female   DOB: 08-11-1991, 24 y.o.   MRN: [id number]  HPI: Brandy Saunders is a 24 y.o. female, referred by Dr Harvie Heck, for management of PCOS.  Pt was dx with PCOS in 2012. She saw ObGyn and then Endocrinology. She was tried on metformin >>intolerance. She has been on Junel OCP in the past.  Fertility/Menstrual cycles: - + irregular menses - sometimes continues bleeding for 3 mo, last ~ 01/2014; usually 1-2x a year  - + h/o ovarian cysts - 2012 - children: no - miscarriages: no - contraception: nothing -unprotected intercourse  Acne: - no  Hirsutism: - not on face - on knuckles and toes - on breasts - not on back - on stomach - plucking and shaving  Weight gain: - 50-75 lbs this year - started to see Antonieta Iba (nutrition) - started to have a sit down job 08/2013 - FH of obesity in "everybody" - has binge eating disorder - + steroid use - for carpal tunnel: 3-4 tapers; on steroids as a child for asthma - no weight loss meds - Meals: - Breakfast: skips - Lunch: fast food - Dinner: fast food Drinks: cut down sodas, wine;  - Diets tried: cuts down fried - Exercise: no  Treatments tried: - she was started on OCPs (Junel Fe) >> did not start yet - tried Metformin >> diarrhea, feeling "bad" - did not try Spironolactone - did not try [place]  Other meds: - SSRIs: no  - Last thyroid tests: Lab Results  Component Value Date   TSH 2.147 09/28/2014   - Last set of lipids:    Component Value Date/Time   CHOL 154 09/28/2014 1146   TRIG 124 09/28/2014 1146   HDL 49 09/28/2014 1146   CHOLHDL 3.1 09/28/2014 1146   VLDL 25 09/28/2014 1146   Lozano 80 09/28/2014 1146   - No HbA1c results available for review.  She has FH of DM in maternal uncle - type 1.   ROS: Constitutional: + weight gain, + fatigue, no subjective hyperthermia/hypothermia, + poor sleep+ Eyes: no blurry vision, no xerophthalmia ENT: no sore throat, no nodules  palpated in throat, no dysphagia/odynophagia, no hoarseness Cardiovascular: no CP/SOB/palpitations/+ leg swelling Respiratory: no cough/SOB Gastrointestinal: no N/V/+ D/_+ C, + heartburn Musculoskeletal: no muscle/joint aches Skin: no acne, + some hair on face, more on chest and abd. Neurological: no tremors/numbness/tingling/dizziness, + HA Psychiatric: no depression/anxiety + irreg. Menses  Past Medical History  Diagnosis Date  . Morbidly obese   . Asthma   . Carpal tunnel syndrome, bilateral   . PCOS (polycystic ovarian syndrome)    Past Surgical History  Procedure Laterality Date  . Tonsilectomy, adenoidectomy, bilateral myringotomy and tubes  1994   History   Social History  . Marital Status: Single    Spouse Name: n/a    Number of Children: 0  . Years of Education: 16   Occupational History  . sales time Forensic scientist    Social History Main Topics  . Smoking status: Former Smoker    Types: Cigarettes  . Smokeless tobacco: Never Used  . Alcohol Use: 1.8 oz/week    3 Glasses of wine per week  . Drug Use: Yes    Special: Marijuana     Comment: once every 6 months  . Sexual Activity:    Partners: Male    Birth Control/ Protection: Condom     Comment: inconsistent condom use   Other Topics Concern  . Not on  file   Social History Narrative   Lives with a roommate.  Parents live in [place], [place].   Current Outpatient Prescriptions on File Prior to Visit  Medication Sig Dispense Refill  . Melatonin 3 MG CAPS Take 3 mg by mouth at bedtime as needed.    . norethindrone-ethinyl estradiol (JUNEL FE 1/20) 1-20 MG-MCG tablet Take 1 tablet by mouth daily. 1 Package 11   No current facility-administered medications on file prior to visit.   Allergies  Allergen Reactions  . Metformin And Related Diarrhea   Family History  Problem Relation Age of Onset  . Hypertension Mother   . Hyperlipidemia Mother   . Stroke Father   . Diabetes Maternal Uncle   . Heart disease  Maternal Grandmother   . Colon cancer Neg Hx    PE: BP 118/68 mmHg  Pulse 95  Temp(Src) 98.7 F (37.1 C) (Oral)  Resp 16  Ht 5\' 6"  (1.676 m)  Wt 436 lb (197.768 kg)  BMI 70.41 kg/m2  SpO2 97% Wt Readings from Last 3 Encounters:  10/05/14 436 lb (197.768 kg)  09/24/14 432 lb (195.954 kg)  09/17/14 431 lb 12.8 oz (195.863 kg)   Constitutional: obese, in NAD, + full supraclavicular fat pads Eyes: PERRLA, EOMI, no exophthalmos ENT: moist mucous membranes, no thyromegaly, no cervical lymphadenopathy Cardiovascular: tachycardia, RR, No MRG Respiratory: CTA B Gastrointestinal: abdomen soft, NT, ND, BS+ Musculoskeletal: no deformities, strength intact in all 4 Skin: moist, warm; no acne on face, no dark terminal hair on chin, + vellum on sideburns, no skin tags, + acanthosis nigricans, no purple, wide, stretch marks Neurological: no tremor with outstretched hands, DTR normal in all 4  ASSESSMENT: 1. PCOS  PLAN: 1.  I had a long discussion with the patient about the fact that the PCOS is a misnomer, a patient does not necessarily have to have polycystic ovaries to be diagnosed with the disorder. This is of sum of several conditions, including:  weight gain  insulin resistance (and therefore a higher risk of developing diabetes later in life)  acne  hirsutism  irregular menstrual cycles  decreased fertility. - We also discussed about the fact that the treatment is usually targeted to addressing the problem that concerns the patient the most: acne/hirsutism, weight gain, or fertility, but there is no single treatment for PCOS.  - The first-line therapy are oral contraceptives (I advised her to start). If she is concerned with her weight, we can use metformin (however, she could not tolerate this in the past); if she is concerned about acne/hirsutism, we can add spironolactone; and if she is concerned about fertility, I could refer her to reproductive endocrinology for possible use  of clomiphene. - I advised her to start OCPs for the health of her endometrial lining and her bones. She agrees to start. - We discussed about possibly starting Metformin XR but she is reticent to start for now. May start this in the future. - we will check: Orders Placed This Encounter  Procedures  . Testosterone, free, total  . Hemoglobin A1c  . 17-Hydroxyprogesterone  . Prolactin  - will see her in 6 mo - Pt instructions: Patient Instructions  Please start the birth control pill.  Please look at the following website: [email or web address]  Please stop at the lab.  Please come back for a follow-up appointment in 6 months.  - time spent with the patient: 1 hour, of which >50% was spent in obtaining information about her symptoms, reviewing her previous  labs, evaluations, and treatments, counseling her about her condition (please see the discussed topics above), and developing a plan to further investigate and treat it. She had a number of questions which I addressed.   Office Visit on 10/05/2014  Component Date Value Ref Range Status  . Testosterone 10/05/2014 37  10 - 70 ng/dL Final   Comment:           Tanner Stage       Female              Female               I              < 30 ng/dL        < 10 ng/dL               II             < 150 ng/dL       < 30 ng/dL               III            100-320 ng/dL     < 35 ng/dL               IV             200-970 ng/dL     15-40 ng/dL               V/Adult        300-890 ng/dL     10-70 ng/dL     . Sex Hormone Binding 10/05/2014 22  17 - 124 nmol/L Final  . Testosterone, Free 10/05/2014 8.2* 0.6 - 6.8 pg/mL Final   Comment:   The concentration of free testosterone is derived from a mathematical expression based on constants for the binding of testosterone to sex hormone-binding globulin and albumin.   . Testosterone-% Free 10/05/2014 2.2  0.4 - 2.4 % Final  . Hgb A1c MFr Bld 10/05/2014 6.3  4.6 - 6.5 % Final   Glycemic Control Guidelines  for People with Diabetes:Non Diabetic:  <6%Goal of Therapy: <7%Additional Action Suggested:  >8%   . 17-OH-Progesterone, LC/MS/MS 10/05/2014 15   Final   Comment:   Adult Female Reference Ranges for   17-Hydroxyprogesterone, LC/MS/MS:      Follicular Phase:       < or = 185 ng/dL    Luteal Phase:           < or = 285 ng/dL    Postmenopausal Phase:   < or = 45 ng/dL    Pregnancy:    First Trimester:  78-457 ng/dL    Second Trimester: 90-357 ng/dL    Third Trimester:  144-578 ng/dL   . Prolactin 10/05/2014 7.1   Final   Comment:      Reference Ranges:                  Female:                       2.1 -  17.1 ng/ml                  Female:   Pregnant          9.7 - 208.5 ng/mL  Non Pregnant      2.8 -  29.2 ng/mL                            Post Menopausal   1.8 -  20.3 ng/mL                       Prolactin and 17 HO progesterone normal. HbA1c in prediabetic range >> needs to try hard to lose weight! At next visit, if no weight loss >> need to try again to convince her to start Metformin XR. Testosterone high >> needs to start OCP.

## 2014-10-06 LAB — TESTOSTERONE, FREE, TOTAL, SHBG
SEX HORMONE BINDING: 22 nmol/L (ref 17–124)
TESTOSTERONE FREE: 8.2 pg/mL — AB (ref 0.6–6.8)
TESTOSTERONE: 37 ng/dL (ref 10–70)
Testosterone-% Free: 2.2 % (ref 0.4–2.4)

## 2014-10-06 LAB — PROLACTIN: PROLACTIN: 7.1 ng/mL

## 2014-10-08 ENCOUNTER — Encounter: Payer: Self-pay | Admitting: Family Medicine

## 2014-10-09 LAB — 17-HYDROXYPROGESTERONE: 17-OH-Progesterone, LC/MS/MS: 15 ng/dL

## 2014-10-19 ENCOUNTER — Encounter: Payer: BLUE CROSS/BLUE SHIELD | Admitting: Dietician

## 2014-10-29 ENCOUNTER — Telehealth: Payer: Self-pay | Admitting: *Deleted

## 2014-10-29 NOTE — Telephone Encounter (Signed)
Prior authorization for Brandy Saunders was completed and insurance states it does not need a prior authorization. Faxed pharmacy. [email or web address]//CMA

## 2014-11-02 ENCOUNTER — Telehealth: Payer: Self-pay | Admitting: Medical

## 2014-11-02 NOTE — Telephone Encounter (Signed)
Called patient mailbox full unable to leave message. Just need to let patient know that she will need to have pregnancy test before refill can be given.

## 2014-11-02 NOTE — Telephone Encounter (Signed)
junel please call patient about this refill

## 2014-11-03 NOTE — Telephone Encounter (Signed)
Chaya Jan, CMA at 10/29/2014 1:46 PM     Status: Signed       Expand All Collapse All   Prior authorization for Junel was completed and insurance states it does not need a prior authorization. Faxed pharmacy. JG//CMA        Spoke with patient and she stated her BCP needed PA, I made her aware of the above and she verbalized understanding. She said she will have them try again. I made her aware to call if she has any concerns and she agreed.      KP

## 2014-11-03 NOTE — Telephone Encounter (Signed)
We have a neg preg from dec--- did she miss pills? If no ok to refill until cpe

## 2014-11-19 ENCOUNTER — Encounter: Payer: BLUE CROSS/BLUE SHIELD | Attending: Physician Assistant | Admitting: Dietician

## 2014-11-19 DIAGNOSIS — E282 Polycystic ovarian syndrome: Secondary | ICD-10-CM | POA: Diagnosis not present

## 2014-11-19 DIAGNOSIS — K589 Irritable bowel syndrome without diarrhea: Secondary | ICD-10-CM | POA: Diagnosis not present

## 2014-11-19 DIAGNOSIS — K760 Fatty (change of) liver, not elsewhere classified: Secondary | ICD-10-CM | POA: Diagnosis not present

## 2014-11-19 DIAGNOSIS — Z6841 Body Mass Index (BMI) 40.0 and over, adult: Secondary | ICD-10-CM | POA: Diagnosis not present

## 2014-11-19 NOTE — Progress Notes (Signed)
Medical Nutrition Therapy:  Appt start time: 0900 end time:  1000.   Assessment:  09/17/14 Primary concerns today: Patient wants to see if dietary habits are contributing to her stomach problems.  Her focus is not weight control but health. Fatty liver per MD.  Morbidly obese and hx of PCOS.  Patient was put on Metformin for PCOS but did not like the side effects.  Weight was more steady until new job a year ago and sits a lot.  Patient has noted weight gain.  There is a gym on work site and also has a Building services engineer and also another Building services engineer but does not use.  Knows she should but motivation is difficult.  Boyfriend has diabetes and is somewhat supportive, is very accepting of her size but does not understand always the need for her to change her eating habits.  Patient states that she is an emotional eater.  There are always a lot of snacks at the office.  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  Patient is here alone.    Patient states that she is lactose intolerant.  Has changed to Mignon but uses other dairy despite the side effects.  Her heartburn and diarrhea have improved and patient did not take the prescribed meds due to never making time to get to the pharmacy.  She has decreased the fat in the diet as well which has had a positive outcome on her GI symptoms.   11/19/14:  Patient is here alone for follow up.  Weight was 423 lbs and has lost 13 lbs in the past 7 weeks.  States that she was following a very low carb diet at the time but then got period and increased craving and binging.  She had been making an increased attempt to eat healthfully but stated that she was thinking about meals and meal prep all of the time.  Patient states that her GI symptoms have improved.  Wt Readings from Last 3 Encounters:  10/05/14 436 lb (197.768 kg)  09/24/14 432 lb (195.954 kg)  09/17/14 431 lb 12.8 oz (195.863 kg)    Preferred Learning  Style:   No preference indicated   Learning Readiness:   Contemplating  Ready  Change in progress   MEDICATIONS: see list   Has been  preparing more food at home and trying to eat more healthfully.  Usual physical activity: none  Estimated energy needs: 1600 calories 180 g carbohydrates 100 g protein 53 g fat  Progress Towards Goal(s):  In progress.   Nutritional Diagnosis:  NB-1.1 Food and nutrition-related knowledge deficit As related to balance of carbohydrates, protein, and fat.  As evidenced by diet hx.    Intervention:  Nutrition Counseling.   Discussed importance of regular eating to avoid craving.  Increase fiber intake.  Consider adding Inositol (relative of B vitamin which helps PCOS and weight) and consider adding chia seeds or flax seeds for addition fiber and Omega 3.  Proper use and side effects discussed. Consider a counselor who specializes in binge eating.  Dominica Severin, PhD contact information e-mailed to patient.   Teaching Method Utilized:  Visual Auditory Hands on  Handouts given during visit include:  Yellow carbohydrate card  Label reading  Reference for Mindless Eating book  Barriers to learning/adherence to lifestyle change: motivation/support.  Demonstrated degree of understanding via:  Teach Back   Monitoring/Evaluation:  Dietary intake, exercise, label reading, and  body weight patient to call as needed. \

## 2015-01-05 ENCOUNTER — Telehealth: Payer: Self-pay | Admitting: *Deleted

## 2015-01-05 DIAGNOSIS — K760 Fatty (change of) liver, not elsewhere classified: Secondary | ICD-10-CM

## 2015-01-05 NOTE — Telephone Encounter (Signed)
Labs in EPIC. Left a message for patient to call back. 

## 2015-01-05 NOTE — Telephone Encounter (Signed)
-----   Message from Vita Barley Hvozdovic, PA-C sent at 01/05/2015  3:43 PM EDT ----- Brandy Saunders, pt was supposed to f/u with me re fatty liver and didn't. Ins company sent me a letter sayimng they rec hepatitis vaccines for pts with fatty lever. Can you have her get hep A bloodwork, hep b surface antibody, hep b surface antigen, and hep c antibody and follow up with me in about a month? We need the bloodwork results before we see her.

## 2015-01-06 NOTE — Telephone Encounter (Signed)
Unable to leave a message because mail box is full.

## 2015-01-11 NOTE — Telephone Encounter (Signed)
Left a message for patient to call back. 

## 2015-01-11 NOTE — Telephone Encounter (Signed)
Patient left a message for me to leave her a voice mail about what we need for her to do. Left a voice mail with instructions to come for labs then set up OV with Lori Hvozdovic, PA-C in one month.

## 2015-02-01 ENCOUNTER — Telehealth: Payer: Self-pay | Admitting: *Deleted

## 2015-02-01 NOTE — Telephone Encounter (Signed)
Spoke with patient and she will come for labs. Scheduled with Lori Hvozdovic, PA-C on 02/15/15 at 2:!5 PM.

## 2015-02-01 NOTE — Telephone Encounter (Signed)
-----   Message from Hulan Saas, RN sent at 01/11/2015  1:34 PM EDT ----- Did patient get labs and set up OV with lori

## 2015-02-02 ENCOUNTER — Ambulatory Visit (INDEPENDENT_AMBULATORY_CARE_PROVIDER_SITE_OTHER): Payer: BLUE CROSS/BLUE SHIELD | Admitting: Family Medicine

## 2015-02-02 ENCOUNTER — Encounter: Payer: Self-pay | Admitting: Family Medicine

## 2015-02-02 VITALS — BP 130/78 | HR 101 | Temp 99.5°F | Ht 66.0 in | Wt >= 6400 oz

## 2015-02-02 DIAGNOSIS — N6320 Unspecified lump in the left breast, unspecified quadrant: Secondary | ICD-10-CM

## 2015-02-02 DIAGNOSIS — N63 Unspecified lump in breast: Secondary | ICD-10-CM | POA: Diagnosis not present

## 2015-02-02 DIAGNOSIS — Z0000 Encounter for general adult medical examination without abnormal findings: Secondary | ICD-10-CM

## 2015-02-02 NOTE — Progress Notes (Signed)
Pre visit review using our clinic review tool, if applicable. No additional management support is needed unless otherwise documented below in the visit note. 

## 2015-02-02 NOTE — Progress Notes (Signed)
Subjective:    Patient ID: Brandy Saunders, female    DOB: 22-Oct-1990, 24 y.o.   MRN: [id number]  HPI  Patient here c/o lump L breast that she noticed a few days ago while walking.   It feels smaller now but still evident-- not tender.  Past Medical History  Diagnosis Date  . Morbidly obese   . Asthma   . Carpal tunnel syndrome, bilateral   . PCOS (polycystic ovarian syndrome)     Review of Systems  Constitutional: Negative for activity change, appetite change, fatigue and unexpected weight change.  Respiratory: Negative for cough and shortness of breath.   Cardiovascular: Negative for chest pain and palpitations.  Psychiatric/Behavioral: Negative for behavioral problems and dysphoric mood. The patient is not nervous/anxious.     Current Outpatient Prescriptions on File Prior to Visit  Medication Sig Dispense Refill  . Melatonin 3 MG CAPS Take 3 mg by mouth at bedtime as needed.    . norethindrone-ethinyl estradiol (JUNEL FE 1/20) 1-20 MG-MCG tablet Take 1 tablet by mouth daily. (Patient not taking: Reported on 02/02/2015) 1 Package 11   No current facility-administered medications on file prior to visit.       Objective:    Physical Exam  Constitutional: She is oriented to person, place, and time. She appears well-developed and well-nourished.  HENT:  Head: Normocephalic and atraumatic.  Eyes: Conjunctivae and EOM are normal.  Neck: Normal range of motion. Neck supple. No JVD present. Carotid bruit is not present. No thyromegaly present.  Cardiovascular: Normal rate, regular rhythm and normal heart sounds.   No murmur heard. Pulmonary/Chest: Effort normal and breath sounds normal. No respiratory distress. She has no wheezes. She has no rales. She exhibits no tenderness.  Genitourinary: No breast swelling, tenderness, discharge or bleeding.  + breast thickening L breast 6 o'clock  Musculoskeletal: She exhibits no edema.  Neurological: She is alert and oriented to  person, place, and time.  Psychiatric: She has a normal mood and affect. Her behavior is normal.    BP 130/78 mmHg  Pulse 101  Temp(Src) 99.5 F (37.5 C) (Oral)  Ht 5\' 6"  (1.676 m)  Wt 427 lb 12.8 oz (194.049 kg)  BMI 69.08 kg/m2  SpO2 97%  LMP 11/07/2014 Wt Readings from Last 3 Encounters:  02/02/15 427 lb 12.8 oz (194.049 kg)  10/05/14 436 lb (197.768 kg)  09/24/14 432 lb (195.954 kg)     Lab Results  Component Value Date   WBC 4.7 09/28/2014   HGB 12.6 09/28/2014   HCT 39.5 09/28/2014   PLT 259 09/28/2014   GLUCOSE 100* 09/28/2014   CHOL 154 09/28/2014   TRIG 124 09/28/2014   HDL 49 09/28/2014   LDLCALC 80 09/28/2014   ALT 51* 09/28/2014   ALT 51* 09/28/2014   AST 37 09/28/2014   AST 37 09/28/2014   NA 135 09/28/2014   K 4.5 09/28/2014   CL 102 09/28/2014   CREATININE 0.67 09/28/2014   BUN 10 09/28/2014   CO2 26 09/28/2014   TSH 2.147 09/28/2014   HGBA1C 6.3 10/05/2014       Assessment & Plan:   Problem List Items Addressed This Visit    None    Visit Diagnoses    Left breast lump    -  Primary    Relevant Orders    MM Digital Diagnostic Bilat    Preventative health care        Relevant Orders    HIV antibody  I am having Ms. Megan Salon maintain her Melatonin and norethindrone-ethinyl estradiol.  No orders of the defined types were placed in this encounter.     Garnet Koyanagi, DO

## 2015-02-02 NOTE — Patient Instructions (Signed)
A diagnostic mammogram will be scheduled-- if you do not hear anything by Friday please call us by lunch

## 2015-02-03 LAB — HIV ANTIBODY (ROUTINE TESTING W REFLEX): HIV 1&2 Ab, 4th Generation: NONREACTIVE

## 2015-02-12 ENCOUNTER — Other Ambulatory Visit: Payer: BLUE CROSS/BLUE SHIELD

## 2015-02-12 DIAGNOSIS — K760 Fatty (change of) liver, not elsewhere classified: Secondary | ICD-10-CM

## 2015-02-13 LAB — HEPATITIS C ANTIBODY: HCV Ab: NEGATIVE

## 2015-02-13 LAB — HEPATITIS B SURFACE ANTIBODY, QUANTITATIVE: HEPATITIS B-POST: 7.7 m[IU]/mL

## 2015-02-13 LAB — HEPATITIS A ANTIBODY, TOTAL: HEP A TOTAL AB: NONREACTIVE

## 2015-02-13 LAB — HEPATITIS B SURFACE ANTIGEN: HEP B S AG: NEGATIVE

## 2015-02-15 ENCOUNTER — Encounter: Payer: Self-pay | Admitting: Physician Assistant

## 2015-02-15 ENCOUNTER — Ambulatory Visit
Admission: RE | Admit: 2015-02-15 | Discharge: 2015-02-15 | Disposition: A | Discharge status: Home / Self Care | Payer: BLUE CROSS/BLUE SHIELD | Source: Ambulatory Visit | Attending: Family Medicine | Admitting: Family Medicine

## 2015-02-15 ENCOUNTER — Ambulatory Visit (INDEPENDENT_AMBULATORY_CARE_PROVIDER_SITE_OTHER): Payer: BLUE CROSS/BLUE SHIELD | Admitting: Physician Assistant

## 2015-02-15 VITALS — BP 128/78 | HR 80 | Ht 65.0 in | Wt >= 6400 oz

## 2015-02-15 DIAGNOSIS — K760 Fatty (change of) liver, not elsewhere classified: Secondary | ICD-10-CM

## 2015-02-15 DIAGNOSIS — K219 Gastro-esophageal reflux disease without esophagitis: Secondary | ICD-10-CM | POA: Diagnosis not present

## 2015-02-15 DIAGNOSIS — N6320 Unspecified lump in the left breast, unspecified quadrant: Secondary | ICD-10-CM

## 2015-02-15 MED ORDER — PANTOPRAZOLE SODIUM 40 MG PO TBEC: 40.0000 mg | DELAYED_RELEASE_TABLET | Freq: Every day | ORAL | Status: DC

## 2015-02-15 NOTE — Progress Notes (Signed)
Patient ID: Brandy Saunders, female   DOB: 01/07/1991, 24 y.o.   MRN: [id number]     History of Present Illness: Brandy Saunders is a delightful 24 year old female who was last seen here in January for diarrhea and GERD. At that time she was having frequent postprandial diarrhea. GI pathogen panel was obtained and was negative. Fecal lactoferrin was negative. Ova and parasites and stool for C. difficile were all negative. She was also evaluated for abnormal LFTs at that time and had an ultrasound that revealed fatty liver. She was also complaining of GERD and was given a trial of pantoprazole. She is here today for follow-up and states overall she is doing much better she has started a low carb diet and is losing weight. She is walking every morning before work and she is going to plan a fitness with several friends several days per week. She has eliminated high fat foods in her stools have regulated where she is having a formed bowel movement on a daily basis. Unfortunately she never picked up her prescription for pantoprazole and is still having some heartburn and some upper abdominal bloating after meals. She denies early satiety or postprandial nausea she belches after meals frequently. She had blood work that revealed that she is in need of a hepatitis B booster as well.   Past Medical History  Diagnosis Date  . Morbidly obese   . Asthma   . Carpal tunnel syndrome, bilateral   . PCOS (polycystic ovarian syndrome)     Past Surgical History  Procedure Laterality Date  . Tonsilectomy, adenoidectomy, bilateral myringotomy and tubes  1994   Family History  Problem Relation Age of Onset  . Hypertension Mother   . Hyperlipidemia Mother   . Stroke Father   . Diabetes Maternal Uncle   . Heart disease Maternal Grandmother   . Colon cancer Neg Hx    History  Substance Use Topics  . Smoking status: Former Smoker    Types: Cigarettes  . Smokeless tobacco: Never Used  . Alcohol Use: 1.8  oz/week    3 Glasses of wine per week   Current Outpatient Prescriptions  Medication Sig Dispense Refill  . Melatonin 3 MG CAPS Take 3 mg by mouth at bedtime as needed.    . norethindrone-ethinyl estradiol (JUNEL FE 1/20) 1-20 MG-MCG tablet Take 1 tablet by mouth daily. 1 Package 11  . pantoprazole (PROTONIX) 40 MG tablet Take 1 tablet (40 mg total) by mouth daily. Take 30 minutes prior to breakfast. 90 tablet 1   No current facility-administered medications for this visit.   Allergies  Allergen Reactions  . Metformin And Related Diarrhea      Review of Systems: Gen: Denies any fever, chills, sweats, anorexia, fatigue, weakness, malaise, weight loss, and sleep disorder CV: Denies chest pain, angina, palpitations, syncope, orthopnea, PND, peripheral edema, and claudication. Resp: Denies dyspnea at rest, dyspnea with exercise, cough, sputum, wheezing, coughing up blood, and pleurisy. GI: Denies vomiting blood, jaundice, and fecal incontinence.   Denies dysphagia or odynophagia. GU : Denies urinary burning, blood in urine, urinary frequency, urinary hesitancy, nocturnal urination, and urinary incontinence. MS: Denies joint pain, limitation of movement, and swelling, stiffness, low back pain, extremity pain. Denies muscle weakness, cramps, atrophy.  Derm: Denies rash, itching, dry skin, hives, moles, warts, or unhealing ulcers.  Psych: Denies depression, anxiety, memory loss, suicidal ideation, hallucinations, paranoia, and confusion. Heme: Denies bruising, bleeding, and enlarged lymph nodes. Neuro:  Denies any headaches, dizziness, paresthesia  Endo:  Denies any problems with DM, thyroid, adrenal  LAB RESULTS: Blood work 02/12/2015 showed hepatitis A antibody nonreactive, hepatitis B antibody 7.7, hepatitis B surface antigen negative, HCV antibody negative, on June 7 she had an HIV antibody that was nonreactive Hepatic function panel in 09/28/2014 total bili 0.5, direct bili 0.1,  indirect bili 0.4, alkaline phosphatase 59, AST 37, ALT 51.  Studies:  omen Complete   Status: Final result       PACS Images     Show images for US Abdomen Complete     Study Result     CLINICAL DATA: Elevated liver function tests  EXAM: ULTRASOUND ABDOMEN COMPLETE  COMPARISON: None.  FINDINGS: Gallbladder: The gallbladder is visualized and no gallstones are noted. There is no pain over the gallbladder with compression.  Common bile duct: Diameter: The common bile duct is not optimally seen due the patient's body habitus, but appears grossly normal measuring 5.7 mm in diameter.  Liver: The liver is very echogenic and inhomogeneous consistent with diffuse fatty infiltration. No focal hepatic abnormality is seen.  IVC: No abnormality visualized.  Pancreas: The pancreas is largely obscured by bowel gas and cannot be well evaluated.  Spleen: The spleen is normal measuring 8.7 cm.  Right Kidney: Length: 12.1 cm. No hydronephrosis is seen.  Left Kidney: Length: 13.1 cm. No hydronephrosis is noted.  Abdominal aorta: The abdominal aorta is not well visualized but no focal dilatation is seen.  Other findings: This study is compromised by the patient's morbid obesity and bowel gas.  IMPRESSION: 1. Echogenic inhomogeneous liver parenchyma consistent with fatty infiltration. No focal hepatic abnormality. 2. No gallstones. 3. The pancreas and abdominal aorta are largely obscured and cannot be assessed. 4. This study is limited by patient body habitus.   Electronically Signed  By: Ivar Drape M.D.  On: 09/08/2014 10:05       Physical Exam: General: Pleasant, obese, African-American female in no acute distress Head: Normocephalic and atraumatic Eyes:  sclerae anicteric, conjunctiva pink  Ears: Normal auditory acuity Lungs: Clear throughout to auscultation Heart: Regular rate and rhythm Abdomen: Soft, obese, non-tender. No masses, no  hepatomegaly. Normal bowel sounds Rectal: Deferred Musculoskeletal: Symmetrical with no gross deformities  Extremities: No edema  Neurological: Alert oriented x 4, grossly nonfocal Psychological:  Alert and cooperative. Normal mood and affect  Assessment and Recommendations:   #1. GERD and antireflux regimen has been reviewed. She will be given a trial of pantoprazole 40 mg by mouth every morning 30 minutes prior to breakfast. She will follow up in 2 months sooner as needed.  #2. Fatty liver. She has been instructed to continue to try to lose weight and to exercise daily. She is cognizant of the fact that she is in need of a hepatitis B vaccine booster but she says she doesn't like needles and will need to think about it. She will be put on the recall list for a hepatitis B booster in 2 months.     Ajamu Maxon, Vita Barley PA-C 02/15/2015,

## 2015-02-15 NOTE — Patient Instructions (Addendum)
We have sent medications to your pharmacy for you to pick up at your convenience.  You need to come back for a Hep B booster.  You have a follow up appointment with Dr Ardis Hughs 05/25/2015 at 2 pm.  Fat and Cholesterol Control Diet Fat and cholesterol levels in your blood and organs are influenced by your diet. High levels of fat and cholesterol may lead to diseases of the heart, small and large blood vessels, gallbladder, liver, and pancreas. CONTROLLING FAT AND CHOLESTEROL WITH DIET Although exercise and lifestyle factors are important, your diet is key. That is because certain foods are known to raise cholesterol and others to lower it. The goal is to balance foods for their effect on cholesterol and more importantly, to replace saturated and trans fat with other types of fat, such as monounsaturated fat, polyunsaturated fat, and omega-3 fatty acids. On average, a person should consume no more than 15 to 17 g of saturated fat daily. Saturated and trans fats are considered "bad" fats, and they will raise LDL cholesterol. Saturated fats are primarily found in animal products such as meats, butter, and cream. However, that does not mean you need to give up all your favorite foods. Today, there are good tasting, low-fat, low-cholesterol substitutes for most of the things you like to eat. Choose low-fat or nonfat alternatives. Choose round or loin cuts of red meat. These types of cuts are lowest in fat and cholesterol. Chicken (without the skin), fish, veal, and ground [place] breast are great choices. Eliminate fatty meats, such as hot dogs and salami. Even shellfish have little or no saturated fat. Have a 3 oz (85 g) portion when you eat lean meat, poultry, or fish. Trans fats are also called "partially hydrogenated oils." They are oils that have been scientifically manipulated so that they are solid at room temperature resulting in a longer shelf life and improved taste and texture of foods in which they are  added. Trans fats are found in stick margarine, some tub margarines, cookies, crackers, and baked goods.  When baking and cooking, oils are a great substitute for butter. The monounsaturated oils are especially beneficial since it is believed they lower LDL and raise HDL. The oils you should avoid entirely are saturated tropical oils, such as coconut and palm.  Remember to eat a lot from food groups that are naturally free of saturated and trans fat, including fish, fruit, vegetables, beans, grains (barley, rice, couscous, bulgur wheat), and pasta (without cream sauces).  IDENTIFYING FOODS THAT LOWER FAT AND CHOLESTEROL  Soluble fiber may lower your cholesterol. This type of fiber is found in fruits such as apples, vegetables such as broccoli, potatoes, and carrots, legumes such as beans, peas, and lentils, and grains such as barley. Foods fortified with plant sterols (phytosterol) may also lower cholesterol. You should eat at least 2 g per day of these foods for a cholesterol lowering effect.  Read package labels to identify low-saturated fats, trans fat free, and low-fat foods at the supermarket. Select cheeses that have only 2 to 3 g saturated fat per ounce. Use a heart-healthy tub margarine that is free of trans fats or partially hydrogenated oil. When buying baked goods (cookies, crackers), avoid partially hydrogenated oils. Breads and muffins should be made from whole grains (whole-wheat or whole oat flour, instead of "flour" or "enriched flour"). Buy non-creamy canned soups with reduced salt and no added fats.  FOOD PREPARATION TECHNIQUES  Never deep-fry. If you must fry, either stir-fry, which  uses very little fat, or use non-stick cooking sprays. When possible, broil, bake, or roast meats, and steam vegetables. Instead of putting butter or margarine on vegetables, use lemon and herbs, applesauce, and cinnamon (for squash and sweet potatoes). Use nonfat yogurt, salsa, and low-fat dressings for salads.   LOW-SATURATED FAT / LOW-FAT FOOD SUBSTITUTES Meats / Saturated Fat (g)  Avoid: Steak, marbled (3 oz/85 g) / 11 g  Choose: Steak, lean (3 oz/85 g) / 4 g  Avoid: Hamburger (3 oz/85 g) / 7 g  Choose: Hamburger, lean (3 oz/85 g) / 5 g  Avoid: Ham (3 oz/85 g) / 6 g  Choose: Ham, lean cut (3 oz/85 g) / 2.4 g  Avoid: Chicken, with skin, dark meat (3 oz/85 g) / 4 g  Choose: Chicken, skin removed, dark meat (3 oz/85 g) / 2 g  Avoid: Chicken, with skin, light meat (3 oz/85 g) / 2.5 g  Choose: Chicken, skin removed, light meat (3 oz/85 g) / 1 g Dairy / Saturated Fat (g)  Avoid: Whole milk (1 cup) / 5 g  Choose: Low-fat milk, 2% (1 cup) / 3 g  Choose: Low-fat milk, 1% (1 cup) / 1.5 g  Choose: Skim milk (1 cup) / 0.3 g  Avoid: Hard cheese (1 oz/28 g) / 6 g  Choose: Skim milk cheese (1 oz/28 g) / 2 to 3 g  Avoid: Cottage cheese, 4% fat (1 cup) / 6.5 g  Choose: Low-fat cottage cheese, 1% fat (1 cup) / 1.5 g  Avoid: Ice cream (1 cup) / 9 g  Choose: Sherbet (1 cup) / 2.5 g  Choose: Nonfat frozen yogurt (1 cup) / 0.3 g  Choose: Frozen fruit bar / trace  Avoid: Whipped cream (1 tbs) / 3.5 g  Choose: Nondairy whipped topping (1 tbs) / 1 g Condiments / Saturated Fat (g)  Avoid: Mayonnaise (1 tbs) / 2 g  Choose: Low-fat mayonnaise (1 tbs) / 1 g  Avoid: Butter (1 tbs) / 7 g  Choose: Extra light margarine (1 tbs) / 1 g  Avoid: Coconut oil (1 tbs) / 11.8 g  Choose: Olive oil (1 tbs) / 1.8 g  Choose: Corn oil (1 tbs) / 1.7 g  Choose: Safflower oil (1 tbs) / 1.2 g  Choose: Sunflower oil (1 tbs) / 1.4 g  Choose: Soybean oil (1 tbs) / 2.4 g  Choose: Canola oil (1 tbs) / 1 g Document Released: 08/14/2005 Document Revised: 12/09/2012 Document Reviewed: 11/12/2013 [employer] Patient Information 2015 [place], [place]. This information is not intended to replace advice given to you by your health care provider. Make sure you discuss any questions you have with your health care  provider.  Food Choices for Gastroesophageal Reflux Disease When you have gastroesophageal reflux disease (GERD), the foods you eat and your eating habits are very important. Choosing the right foods can help ease the discomfort of GERD. WHAT GENERAL GUIDELINES DO I NEED TO FOLLOW?  Choose fruits, vegetables, whole grains, low-fat dairy products, and low-fat meat, fish, and poultry.  Limit fats such as oils, salad dressings, butter, nuts, and avocado.  Keep a food diary to identify foods that cause symptoms.  Avoid foods that cause reflux. These may be different for different people.  Eat frequent small meals instead of three large meals each day.  Eat your meals slowly, in a relaxed setting.  Limit fried foods.  Cook foods using methods other than frying.  Avoid drinking alcohol.  Avoid drinking large amounts of  liquids with your meals.  Avoid bending over or lying down until 2-3 hours after eating. WHAT FOODS ARE NOT RECOMMENDED? The following are some foods and drinks that may worsen your symptoms: Vegetables Tomatoes. Tomato juice. Tomato and spaghetti sauce. Chili peppers. Onion and garlic. Horseradish. Fruits Oranges, grapefruit, and lemon (fruit and juice). Meats High-fat meats, fish, and poultry. This includes hot dogs, ribs, ham, sausage, salami, and bacon. Dairy Whole milk and chocolate milk. Sour cream. Cream. Butter. Ice cream. Cream cheese.  Beverages Coffee and tea, with or without caffeine. Carbonated beverages or energy drinks. Condiments Hot sauce. Barbecue sauce.  Sweets/Desserts Chocolate and cocoa. Donuts. Peppermint and spearmint. Fats and Oils High-fat foods, including [place] fries and potato chips. Other Vinegar. Strong spices, such as black pepper, white pepper, red pepper, cayenne, curry powder, cloves, ginger, and chili powder. The items listed above may not be a complete list of foods and beverages to avoid. Contact your dietitian for more  information. Document Released: 08/14/2005 Document Revised: 08/19/2013 Document Reviewed: 06/18/2013 [facility] Patient Information 2015 [place], [place]. This information is not intended to replace advice given to you by your health care provider. Make sure you discuss any questions you have with your health care provider.

## 2015-02-16 NOTE — Progress Notes (Signed)
i agree with the above note, plan 

## 2015-03-08 ENCOUNTER — Telehealth: Payer: Self-pay | Admitting: Physician Assistant

## 2015-03-08 NOTE — Telephone Encounter (Signed)
Called the pharmacy and there was a problem with Brandy Saunders's state license number. They stated they would call me back if there was still a problem with medication, never received the call. Patient informed that she should call the pharmacy and se if medication was filled.

## 2015-04-05 ENCOUNTER — Ambulatory Visit: Payer: BLUE CROSS/BLUE SHIELD | Admitting: Internal Medicine

## 2015-04-06 ENCOUNTER — Encounter: Payer: Self-pay | Admitting: Physician Assistant

## 2015-04-06 ENCOUNTER — Encounter: Payer: Self-pay | Admitting: Family Medicine

## 2015-04-07 ENCOUNTER — Other Ambulatory Visit: Payer: Self-pay

## 2015-04-07 DIAGNOSIS — E282 Polycystic ovarian syndrome: Secondary | ICD-10-CM

## 2015-04-07 MED ORDER — NORETHIN ACE-ETH ESTRAD-FE 1-20 MG-MCG PO TABS: 1.0000 | ORAL_TABLET | Freq: Every day | ORAL | Status: DC

## 2015-04-07 MED ORDER — PANTOPRAZOLE SODIUM 40 MG PO TBEC: 40.0000 mg | DELAYED_RELEASE_TABLET | Freq: Every day | ORAL | Status: DC

## 2015-05-17 ENCOUNTER — Telehealth: Payer: Self-pay | Admitting: Family Medicine

## 2015-05-17 NOTE — Telephone Encounter (Signed)
She needs an apt.     KP 

## 2015-05-17 NOTE — Telephone Encounter (Signed)
Pt is wanting to have STI testing (for sexual health). She is asking if she needs appt with Dr. Etter Sjogren or lab appt only. Please advise and I will f/u with pt for appt.  Ph# [phone]

## 2015-05-18 NOTE — Telephone Encounter (Signed)
Left msg for pt to call and schedule appt with Dr. Etter Sjogren for STI testing

## 2015-05-25 ENCOUNTER — Ambulatory Visit: Payer: BLUE CROSS/BLUE SHIELD | Admitting: Gastroenterology

## 2015-06-15 ENCOUNTER — Encounter: Payer: Self-pay | Admitting: Gastroenterology

## 2015-07-14 ENCOUNTER — Ambulatory Visit (INDEPENDENT_AMBULATORY_CARE_PROVIDER_SITE_OTHER): Payer: BLUE CROSS/BLUE SHIELD | Admitting: Family

## 2015-07-14 ENCOUNTER — Encounter: Payer: Self-pay | Admitting: Family

## 2015-07-14 VITALS — BP 122/82 | HR 91 | Temp 97.9°F | Resp 18 | Ht 65.0 in | Wt >= 6400 oz

## 2015-07-14 DIAGNOSIS — J069 Acute upper respiratory infection, unspecified: Secondary | ICD-10-CM | POA: Diagnosis not present

## 2015-07-14 MED ORDER — CEFUROXIME AXETIL 500 MG PO TABS: 500.0000 mg | ORAL_TABLET | Freq: Two times a day (BID) | ORAL | Status: DC

## 2015-07-14 MED ORDER — PREDNISONE 20 MG PO TABS: 20.0000 mg | ORAL_TABLET | Freq: Two times a day (BID) | ORAL | Status: DC

## 2015-07-14 NOTE — Patient Instructions (Signed)
Thank you for choosing  HealthCare.  Summary/Instructions:  Your prescription(s) have been submitted to your pharmacy or been printed and provided for you. Please take as directed and contact our office if you believe you are having problem(s) with the medication(s) or have any questions.  If your symptoms worsen or fail to improve, please contact our office for further instruction, or in case of emergency go directly to the emergency room at the closest medical facility.   General Recommendations:    Please drink plenty of fluids.  Get plenty of rest   Sleep in humidified air  Use saline nasal sprays  Netti pot   OTC Medications:  Decongestants - helps relieve congestion   Flonase (generic fluticasone) or Nasacort (generic triamcinolone) - please make sure to use the "cross-over" technique at a 45 degree angle towards the opposite eye as opposed to straight up the nasal passageway.   Sudafed (generic pseudoephedrine - Note this is the one that is available behind the pharmacy counter); Products with phenylephrine (-PE) may also be used but is often not as effective as pseudoephedrine.   If you have HIGH BLOOD PRESSURE - Coricidin HBP; AVOID any product that is -D as this contains pseudoephedrine which may increase your blood pressure.  Afrin (oxymetazoline) every 6-8 hours for up to 3 days.   Allergies - helps relieve runny nose, itchy eyes and sneezing   Claritin (generic loratidine), Allegra (fexofenidine), or Zyrtec (generic cyrterizine) for runny nose. These medications should not cause drowsiness.  Note - Benadryl (generic diphenhydramine) may be used however may cause drowsiness  Cough -   Delsym or Robitussin (generic dextromethorphan)  Expectorants - helps loosen mucus to ease removal   Mucinex (generic guaifenesin) as directed on the package.  Headaches / General Aches   Tylenol (generic acetaminophen) - DO NOT EXCEED 3 grams (3,000 mg) in a 24  hour time period  Advil/Motrin (generic ibuprofen)   Sore Throat -   Salt water gargle   Chloraseptic (generic benzocaine) spray or lozenges / Sucrets (generic dyclonine)      

## 2015-07-14 NOTE — Assessment & Plan Note (Signed)
Symptoms and exam consistent with acute upper respiratory infection. Start Ceftin. Start prednisone as needed for allergies. Continue over the counter medications as needed for symptom relief and supportive care. Follow up if symptoms worsen or fail to improve.

## 2015-07-14 NOTE — Progress Notes (Signed)
Pre visit review using our clinic review tool, if applicable. No additional management support is needed unless otherwise documented below in the visit note. 

## 2015-07-14 NOTE — Progress Notes (Signed)
   Subjective:    Patient ID: Brandy Saunders, female    DOB: 28-Jun-1991, 24 y.o.   MRN: [id number]  Chief Complaint  Patient presents with  . Nasal Congestion    x10 days, bad congestion, cough    HPI:  Brandy Saunders is a 24 y.o. female who  has a past medical history of Morbidly obese ([place]); Asthma; Carpal tunnel syndrome, bilateral; and PCOS (polycystic ovarian syndrome). and presents today for an acute office visit.  Associated symptoms of congestion and cough have been going on for approximately 10 days. Modifying factors include Nyquil and Dayquil which have not helped with her symptoms but allowed her to function at work. Severity of the cough is enough to disturb her sleep pattern. Denies any recent antibiotic.   Allergies  Allergen Reactions  . Metformin And Related Diarrhea     Current Outpatient Prescriptions on File Prior to Visit  Medication Sig Dispense Refill  . Melatonin 3 MG CAPS Take 3 mg by mouth at bedtime as needed.    . norethindrone-ethinyl estradiol (JUNEL FE 1/20) 1-20 MG-MCG tablet Take 1 tablet by mouth daily. 3 Package 4  . pantoprazole (PROTONIX) 40 MG tablet Take 1 tablet (40 mg total) by mouth daily. Take 30 minutes prior to breakfast. 90 tablet 0   No current facility-administered medications on file prior to visit.    Review of Systems  Constitutional: Negative for fever and chills.  HENT: Positive for congestion, rhinorrhea and sneezing. Negative for sinus pressure and sore throat.   Respiratory: Positive for cough. Negative for chest tightness and shortness of breath.   Neurological: Positive for headaches.      Objective:    BP 122/82 mmHg  Pulse 91  Temp(Src) 97.9 F (36.6 C) (Oral)  Resp 18  Ht 5\' 5"  (1.651 m)  Wt 426 lb (193.232 kg)  BMI 70.89 kg/m2  SpO2 98% Nursing note and vital signs reviewed.  Physical Exam  Constitutional: She is oriented to person, place, and time. She appears well-developed and well-nourished.  No distress.  HENT:  Right Ear: Hearing, tympanic membrane, external ear and ear canal normal.  Left Ear: Hearing, tympanic membrane, external ear and ear canal normal.  Nose: Nose normal. Right sinus exhibits no maxillary sinus tenderness and no frontal sinus tenderness. Left sinus exhibits no maxillary sinus tenderness and no frontal sinus tenderness.  Mouth/Throat: Uvula is midline, oropharynx is clear and moist and mucous membranes are normal.  Cardiovascular: Normal rate, regular rhythm, normal heart sounds and intact distal pulses.   Pulmonary/Chest: Effort normal and breath sounds normal.  Neurological: She is alert and oriented to person, place, and time.  Skin: Skin is warm and dry.  Psychiatric: She has a normal mood and affect. Her behavior is normal. Judgment and thought content normal.       Assessment & Plan:   Problem List Items Addressed This Visit      Respiratory   Acute upper respiratory infection - Primary    Symptoms and exam consistent with acute upper respiratory infection. Start Ceftin. Start prednisone as needed for allergies. Continue over the counter medications as needed for symptom relief and supportive care. Follow up if symptoms worsen or fail to improve.       Relevant Medications   cefUROXime (CEFTIN) 500 MG tablet   predniSONE (DELTASONE) 20 MG tablet

## 2015-07-28 ENCOUNTER — Other Ambulatory Visit (HOSPITAL_COMMUNITY)
Admission: RE | Admit: 2015-07-28 | Discharge: 2015-07-28 | Disposition: A | Discharge status: Home / Self Care | Payer: BLUE CROSS/BLUE SHIELD | Source: Ambulatory Visit | Attending: Medical | Admitting: Medical

## 2015-07-28 ENCOUNTER — Ambulatory Visit (INDEPENDENT_AMBULATORY_CARE_PROVIDER_SITE_OTHER): Payer: BLUE CROSS/BLUE SHIELD | Admitting: Medical

## 2015-07-28 ENCOUNTER — Encounter: Payer: Self-pay | Admitting: Medical

## 2015-07-28 VITALS — BP 122/84 | HR 67 | Temp 98.1°F | Ht 65.0 in | Wt >= 6400 oz

## 2015-07-28 DIAGNOSIS — R05 Cough: Secondary | ICD-10-CM | POA: Diagnosis not present

## 2015-07-28 DIAGNOSIS — Z113 Encounter for screening for infections with a predominantly sexual mode of transmission: Secondary | ICD-10-CM | POA: Insufficient documentation

## 2015-07-28 DIAGNOSIS — J0110 Acute frontal sinusitis, unspecified: Secondary | ICD-10-CM | POA: Diagnosis not present

## 2015-07-28 DIAGNOSIS — J302 Other seasonal allergic rhinitis: Secondary | ICD-10-CM

## 2015-07-28 DIAGNOSIS — N760 Acute vaginitis: Secondary | ICD-10-CM | POA: Diagnosis present

## 2015-07-28 DIAGNOSIS — R059 Cough, unspecified: Secondary | ICD-10-CM

## 2015-07-28 MED ORDER — FLUCONAZOLE 150 MG PO TABS: 150.0000 mg | ORAL_TABLET | Freq: Once | ORAL | Status: DC

## 2015-07-28 MED ORDER — AZITHROMYCIN 250 MG PO TABS: ORAL_TABLET | ORAL | Status: DC

## 2015-07-28 MED ORDER — BENZONATATE 100 MG PO CAPS: 100.0000 mg | ORAL_CAPSULE | Freq: Three times a day (TID) | ORAL | Status: DC | PRN

## 2015-07-28 MED ORDER — MONTELUKAST SODIUM 10 MG PO TABS: 10.0000 mg | ORAL_TABLET | Freq: Every day | ORAL | Status: DC

## 2015-07-28 MED ORDER — FLUTICASONE PROPIONATE 50 MCG/ACT NA SUSP: 2.0000 | Freq: Every day | NASAL | Status: DC

## 2015-07-28 NOTE — Progress Notes (Signed)
Subjective:    Patient ID: Brandy Saunders, female    DOB: Feb 26, 1991, 24 y.o.   MRN: [id number]  HPI  Pt in sick and reports since 7th of December. Early on runny nose, nasal congestion, dry cough and st. Occasional mucous production when she coughs.   At first when symptoms  started was on nyquil.  Pt states on November 16 th treated  for the above with Ceftin and prednisone.Pt felt some better but then got sick again this past wed with the above symptoms.  LMP- October. But not sexually active presently/since September(she states can't be pregnant). Even though not active she has been in past and wants std testing.(mentioned at end of interview concern for std. No overt symptoms.     Review of Systems  Constitutional: Positive for fever and chills. Negative for fatigue.  HENT: Positive for congestion, postnasal drip, sinus pressure and sneezing. Negative for dental problem and ear pain.   Respiratory: Positive for cough. Negative for apnea, chest tightness, shortness of breath and wheezing.   Cardiovascular: Negative for chest pain and palpitations.  Gastrointestinal: Negative for abdominal pain.  Musculoskeletal: Negative for myalgias and back pain.  Skin: Negative for rash.  Hematological: Negative for adenopathy. Does not bruise/bleed easily.    Past Medical History  Diagnosis Date  . Morbidly obese ([place])   . Asthma   . Carpal tunnel syndrome, bilateral   . PCOS (polycystic ovarian syndrome)     Social History   Social History  . Marital Status: Single    Spouse Name: n/a  . Number of Children: 0  . Years of Education: 16   Occupational History  . sales time Forensic scientist    Social History Main Topics  . Smoking status: Former Smoker    Types: Cigarettes  . Smokeless tobacco: Never Used  . Alcohol Use: 1.8 oz/week    3 Glasses of wine per week  . Drug Use: Yes    Special: Marijuana     Comment: once every 6 months  . Sexual Activity:    Partners: Male   Birth Control/ Protection: Condom     Comment: inconsistent condom use   Other Topics Concern  . Not on file   Social History Narrative   Lives with a roommate.  Parents live in [place], [place].    Past Surgical History  Procedure Laterality Date  . Tonsilectomy, adenoidectomy, bilateral myringotomy and tubes  1994    Family History  Problem Relation Age of Onset  . Hypertension Mother   . Hyperlipidemia Mother   . Stroke Father   . Diabetes Maternal Uncle   . Heart disease Maternal Grandmother   . Colon cancer Neg Hx     Allergies  Allergen Reactions  . Metformin And Related Diarrhea    Current Outpatient Prescriptions on File Prior to Visit  Medication Sig Dispense Refill  . Melatonin 3 MG CAPS Take 3 mg by mouth at bedtime as needed.    . norethindrone-ethinyl estradiol (JUNEL FE 1/20) 1-20 MG-MCG tablet Take 1 tablet by mouth daily. 3 Package 4  . pantoprazole (PROTONIX) 40 MG tablet Take 1 tablet (40 mg total) by mouth daily. Take 30 minutes prior to breakfast. 90 tablet 0   No current facility-administered medications on file prior to visit.    BP 122/84 mmHg  Pulse 67  Temp(Src) 98.1 F (36.7 C) (Oral)  Ht 5\' 5"  (1.651 m)  Wt 431 lb (195.5 kg)  BMI 71.72 kg/m2  SpO2 98%  LMP 06/13/2015 (Exact Date)       Objective:   Physical Exam  General  Mental Status - Alert. General Appearance - Well groomed. Not in acute distress.  Skin Rashes- No Rashes.  HEENT Head- Normal. Ear Auditory Canal - Left- Normal. Right - Normal.Tympanic Membrane- Left- Normal. Right- Normal. Eye Sclera/Conjunctiva- Left- Normal. Right- Normal. Nose & Sinuses Nasal Mucosa- Left-  Boggy and Congested. Right-  Boggy and  Congested.Bilateral maxillary and frontal sinus pressure. Mouth & Throat Lips: Upper Lip- Normal: no dryness, cracking, pallor, cyanosis, or vesicular eruption. Lower Lip-Normal: no dryness, cracking, pallor, cyanosis or vesicular eruption. Buccal Mucosa-  Bilateral- No Aphthous ulcers. Oropharynx- No Discharge or Erythema. +pnd Tonsils: Characteristics- Bilateral- No Erythema or Congestion. Size/Enlargement- Bilateral- No enlargement. Discharge- bilateral-None.  Neck Neck- Supple. No Masses.   Chest and Lung Exam Auscultation: Breath Sounds:-Clear even and unlabored.  Cardiovascular Auscultation:Rythm- Regular, rate and rhythm. Murmurs & Other Heart Sounds:Ausculatation of the heart reveal- No Murmurs.  Lymphatic Head & Neck General Head & Neck Lymphatics: Bilateral: Description- No Localized lymphadenopathy.       Assessment & Plan:  You appear to have started with allergies. With secondary sinusitis and possible bronchitis.  Will rx flonase and montelukast. Start claritin otc in 10-14 days.  Rx azithromycin antibiotic.  Benzonatate for cough.  Follow up in 7 days any persisting symptoms or as needed

## 2015-07-28 NOTE — Patient Instructions (Addendum)
You appear to have started with allergies. With secondary sinusitis and possible bronchitis.  Will rx flonase and montelukast. Start claritin otc in 10-14 days.  Rx azithromycin antibiotic.  Benzonatate for cough.  Follow up in 7 days any persisting sympttms or as needed

## 2015-07-28 NOTE — Progress Notes (Signed)
Pre visit review using our clinic review tool, if applicable. No additional management support is needed unless otherwise documented below in the visit note. 

## 2015-07-29 LAB — RPR

## 2015-07-29 LAB — URINE CYTOLOGY ANCILLARY ONLY
Chlamydia: NEGATIVE
Neisseria Gonorrhea: NEGATIVE
Trichomonas: NEGATIVE

## 2015-07-29 LAB — HIV ANTIBODY (ROUTINE TESTING W REFLEX): HIV: NONREACTIVE

## 2015-07-30 ENCOUNTER — Telehealth: Payer: Self-pay | Admitting: Medical

## 2015-07-30 LAB — URINE CYTOLOGY ANCILLARY ONLY: Bacterial vaginitis: POSITIVE — AB

## 2015-07-30 MED ORDER — METRONIDAZOLE 500 MG PO TABS: 500.0000 mg | ORAL_TABLET | Freq: Three times a day (TID) | ORAL | Status: DC

## 2015-07-30 NOTE — Telephone Encounter (Signed)
Called patient,no answer, unable to leave message for return call, mailbox full.

## 2015-07-30 NOTE — Telephone Encounter (Signed)
Brandy Saunders unable to contact pt by phone. Will you send letter advising her about the bv and flagyl that I sent in. Or try to call her again.

## 2015-07-30 NOTE — Telephone Encounter (Signed)
Pt was positive for bv. So will rx flagyl antibiotic. Please notify pt.

## 2015-08-01 NOTE — Telephone Encounter (Signed)
Did you see note on patient Brandy Saunders and treatment. Please notify pt.

## 2015-08-02 NOTE — Telephone Encounter (Signed)
Called patient again today able to leave message on vm. Asked for return call important.

## 2015-08-02 NOTE — Telephone Encounter (Signed)
MyChart message sent as well as a letter to patient.

## 2015-08-02 NOTE — Telephone Encounter (Signed)
Letter prepared for mailing

## 2015-08-12 ENCOUNTER — Encounter: Payer: Self-pay | Admitting: Medical

## 2015-10-01 ENCOUNTER — Ambulatory Visit (INDEPENDENT_AMBULATORY_CARE_PROVIDER_SITE_OTHER): Payer: Managed Care, Other (non HMO) | Admitting: Medical

## 2015-10-01 ENCOUNTER — Encounter: Payer: Self-pay | Admitting: Medical

## 2015-10-01 VITALS — BP 120/80 | HR 78 | Temp 98.0°F | Ht 65.0 in | Wt >= 6400 oz

## 2015-10-01 DIAGNOSIS — R059 Cough, unspecified: Secondary | ICD-10-CM

## 2015-10-01 DIAGNOSIS — J209 Acute bronchitis, unspecified: Secondary | ICD-10-CM | POA: Diagnosis not present

## 2015-10-01 DIAGNOSIS — J029 Acute pharyngitis, unspecified: Secondary | ICD-10-CM

## 2015-10-01 DIAGNOSIS — R05 Cough: Secondary | ICD-10-CM

## 2015-10-01 MED ORDER — FLUCONAZOLE 150 MG PO TABS: 150.0000 mg | ORAL_TABLET | Freq: Once | ORAL | Status: DC

## 2015-10-01 MED ORDER — AZITHROMYCIN 250 MG PO TABS: ORAL_TABLET | ORAL | Status: DC

## 2015-10-01 NOTE — Patient Instructions (Signed)
For pharyngitis will prescribe azithromycin.  You also have early bronchitis symptoms azithromycin should help with this as well.  For cough use your benzonatate.  Nasal congestion use your flonase.  Follow up in 7-10 days or as needed

## 2015-10-01 NOTE — Progress Notes (Signed)
Subjective:    Patient ID: Brandy Saunders, female    DOB: 09/19/1990, 25 y.o.   MRN: [id number]  HPI Pt has sore throat for 1 day. Moderate. Associated symptom.  Body aches-no Fever- yes Chills-yes HA-mild ha on tuesday Neck symptoms-no Lymph node enlargement- pt thought maybe Rash-no Painful swallowing-yes. Hurt to sleep.  Recent strep contact- Pt works with [employer]. Pt is nasal congested.     Review of Systems  Constitutional: Positive for fever, chills and fatigue.  HENT: Positive for congestion and sore throat. Negative for ear pain, postnasal drip, rhinorrhea and sneezing.   Respiratory: Positive for cough. Negative for chest tightness, shortness of breath and wheezing.        Some prodcuctive cough  Cardiovascular: Negative for chest pain and palpitations.  Musculoskeletal: Negative for myalgias and back pain.  Skin: Negative for rash.  Neurological: Negative for dizziness and headaches.  Hematological: Negative for adenopathy. Does not bruise/bleed easily.  Psychiatric/Behavioral: Negative for behavioral problems.    Past Medical History  Diagnosis Date  . Morbidly obese ([place])   . Asthma   . Carpal tunnel syndrome, bilateral   . PCOS (polycystic ovarian syndrome)     Social History   Social History  . Marital Status: Single    Spouse Name: n/a  . Number of Children: 0  . Years of Education: 16   Occupational History  . sales time Forensic scientist    Social History Main Topics  . Smoking status: Former Smoker    Types: Cigarettes  . Smokeless tobacco: Never Used  . Alcohol Use: 1.8 oz/week    3 Glasses of wine per week  . Drug Use: Yes    Special: Marijuana     Comment: once every 6 months  . Sexual Activity:    Partners: Male    Birth Control/ Protection: Condom     Comment: inconsistent condom use   Other Topics Concern  . Not on file   Social History Narrative   Lives with a roommate.  Parents live in [place], [place].    Past Surgical  History  Procedure Laterality Date  . Tonsilectomy, adenoidectomy, bilateral myringotomy and tubes  1994    Family History  Problem Relation Age of Onset  . Hypertension Mother   . Hyperlipidemia Mother   . Stroke Father   . Diabetes Maternal Uncle   . Heart disease Maternal Grandmother   . Colon cancer Neg Hx     Allergies  Allergen Reactions  . Metformin And Related Diarrhea    Current Outpatient Prescriptions on File Prior to Visit  Medication Sig Dispense Refill  . benzonatate (TESSALON) 100 MG capsule Take 1 capsule (100 mg total) by mouth 3 (three) times daily as needed. (Patient not taking: Reported on 10/01/2015) 21 capsule 0  . fluticasone (FLONASE) 50 MCG/ACT nasal spray Place 2 sprays into both nostrils daily. 16 g 1  . montelukast (SINGULAIR) 10 MG tablet Take 1 tablet (10 mg total) by mouth at bedtime. 30 tablet 3  . norethindrone-ethinyl estradiol (JUNEL FE 1/20) 1-20 MG-MCG tablet Take 1 tablet by mouth daily. 3 Package 4  . pantoprazole (PROTONIX) 40 MG tablet Take 1 tablet (40 mg total) by mouth daily. Take 30 minutes prior to breakfast. 90 tablet 0   No current facility-administered medications on file prior to visit.    BP 120/80 mmHg  Pulse 78  Temp(Src) 98 F (36.7 C) (Oral)  Ht 5\' 5"  (1.651 m)  Wt 438 lb (198.675 kg)  BMI 72.89 kg/m2  SpO2 98%       Objective:   Physical Exam  General  Mental Status - Alert. General Appearance - Well groomed. Not in acute distress.  Skin Rashes- No Rashes.  HEENT Head- Normal. Ear Auditory Canal - Left- Normal. Right - Normal.Tympanic Membrane- Left- Normal. Right- Normal. Eye Sclera/Conjunctiva- Left- Normal. Right- Normal. Nose & Sinuses Nasal Mucosa- Left-  Boggy and Congested. Right-  Boggy and  Congested.Bilateral no maxillary and no  frontal sinus pressure. Mouth & Throat Lips: Upper Lip- Normal: no dryness, cracking, pallor, cyanosis, or vesicular eruption. Lower Lip-Normal: no dryness, cracking,  pallor, cyanosis or vesicular eruption. Buccal Mucosa- Bilateral- No Aphthous ulcers. Oropharynx- no  Discharge but  Erythema. Tonsils: Characteristics- Bilateral- Erythema and  Congestion. Size/Enlargement- Bilateral- No enlargement. Discharge- bilateral-None.  Neck Neck- Supple. No Masses. mild enlarged and tender submandibular nodes.   Chest and Lung Exam Auscultation: Breath Sounds:-Clear even and unlabored.  Cardiovascular Auscultation:Rythm- Regular, rate and rhythm. Murmurs & Other Heart Sounds:Ausculatation of the heart reveal- No Murmurs.  Lymphatic Head & Neck General Head & Neck Lymphatics: Bilateral: Description- see neck exam.       Assessment & Plan:  For pharyngitis will prescribe azithromycin.  You also have early bronchitis symptoms azithromycin should help with this as well.  For cough use your benzonatate.  Nasal congestion use your flonase.  Follow up in 7-10 days or as needed

## 2015-10-01 NOTE — Progress Notes (Signed)
Pre visit review using our clinic review tool, if applicable. No additional management support is needed unless otherwise documented below in the visit note. 

## 2015-10-29 ENCOUNTER — Telehealth: Payer: Self-pay

## 2015-10-29 ENCOUNTER — Ambulatory Visit (INDEPENDENT_AMBULATORY_CARE_PROVIDER_SITE_OTHER): Payer: Managed Care, Other (non HMO) | Admitting: Internal Medicine

## 2015-10-29 ENCOUNTER — Ambulatory Visit (INDEPENDENT_AMBULATORY_CARE_PROVIDER_SITE_OTHER): Payer: Managed Care, Other (non HMO)

## 2015-10-29 VITALS — BP 128/74 | HR 86 | Temp 98.1°F | Resp 18 | Ht 65.0 in | Wt >= 6400 oz

## 2015-10-29 DIAGNOSIS — M25571 Pain in right ankle and joints of right foot: Secondary | ICD-10-CM

## 2015-10-29 MED ORDER — MELOXICAM 15 MG PO TABS: 15.0000 mg | ORAL_TABLET | Freq: Every day | ORAL | Status: DC

## 2015-10-29 NOTE — Patient Instructions (Signed)
Because you received an x-ray today, you will receive an invoice from [facility]. Please contact [facility] at [phone] with questions or concerns regarding your invoice. Our billing staff will not be able to assist you with those questions. °

## 2015-10-29 NOTE — Progress Notes (Signed)
Subjective:  By signing my name below, I, Brandy Saunders, attest that this documentation has been prepared under the direction and in the presence of Brandy Koyanagi, MD Electronically Signed: Ladene Saunders, ED Scribe 10/29/2015 at 7:02 PM.   Patient ID: Brandy Saunders, female    DOB: [birth year], 25 y.o.   MRN: [id number]  Chief Complaint  Patient presents with  . Foot Swelling    right, last night   HPI HPI Comments: Brandy Saunders is a 25 y.o. female who presents to the [facility] complaining of gradually worsening right foot pain onset last night. 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 Last night, she noticed right ankle pain that is worsened with movement and ambulating, improved with sitting. Pt reports associated swelling onset last night as well. She reports chronic left foot swelling but denies h/o swelling on the right. No treatments tried PTA.  Past Medical History  Diagnosis Date  . Morbidly obese ([place])   . Asthma   . Carpal tunnel syndrome, bilateral   . PCOS (polycystic ovarian syndrome)    Current Outpatient Prescriptions on File Prior to Visit  Medication Sig Dispense Refill  . norethindrone-ethinyl estradiol (JUNEL FE 1/20) 1-20 MG-MCG tablet Take 1 tablet by mouth daily. 3 Package 4  . fluconazole (DIFLUCAN) 150 MG tablet Take 1 tablet (150 mg total) by mouth once. (Patient not taking: Reported on 10/29/2015) 1 tablet 0  . fluticasone (FLONASE) 50 MCG/ACT nasal spray Place 2 sprays into both nostrils daily. (Patient not taking: Reported on 10/29/2015) 16 g 1  . montelukast (SINGULAIR) 10 MG tablet Take 1 tablet (10 mg total) by mouth at bedtime. (Patient not taking: Reported on 10/29/2015) 30 tablet 3  . pantoprazole (PROTONIX) 40 MG tablet Take 1 tablet (40 mg total) by mouth daily. Take 30 minutes prior to breakfast. (Patient not taking: Reported on 10/29/2015) 90 tablet 0   No current  facility-administered medications on file prior to visit.   Allergies  Allergen Reactions  . Metformin And Related Diarrhea   Review of Systems  Musculoskeletal: Positive for joint swelling and arthralgias.      Objective:   Physical Exam  Constitutional: She is oriented to person, place, and time. She appears well-developed and well-nourished. No distress.  HENT:  Head: Normocephalic and atraumatic.  Eyes: Conjunctivae and EOM are normal.  Neck: Neck supple.  Cardiovascular: Normal rate.   Pulmonary/Chest: Effort normal. No respiratory distress.  Musculoskeletal: Normal range of motion.  R foot is swollen dorsally without pitting. Achilles is tender to palpation but not swollen. Tender over the mid 3rd MT dorsally. Ankle mortise is intact without pain.   Neurological: She is alert and oriented to person, place, and time.  Skin: Skin is warm and dry.  Psychiatric: She has a normal mood and affect. Her behavior is normal.  Nursing note and vitals reviewed.    xray negative fx Assessment & Plan:  I have completed the patient encounter in its entirety as documented by the scribe, with editing by me where necessary. Brandy Saunders, M.D.  Pain in joint, ankle and foot, right - Plan: DG Foot Complete Right, Ambulatory referral to Podiatry  Likely 2nd to losing structural support and in need of weight loss and orthotics Meds ordered this encounter  Medications  . meloxicam (MOBIC) 15 MG tablet    Sig: Take 1 tablet (15 mg total) by mouth  daily.    Dispense:  30 tablet    Refill:  0

## 2015-11-10 ENCOUNTER — Ambulatory Visit (INDEPENDENT_AMBULATORY_CARE_PROVIDER_SITE_OTHER): Payer: Managed Care, Other (non HMO) | Admitting: Podiatry

## 2015-11-10 ENCOUNTER — Ambulatory Visit (INDEPENDENT_AMBULATORY_CARE_PROVIDER_SITE_OTHER): Payer: Managed Care, Other (non HMO)

## 2015-11-10 ENCOUNTER — Encounter: Payer: Self-pay | Admitting: Podiatry

## 2015-11-10 VITALS — BP 122/82 | HR 79 | Resp 16

## 2015-11-10 DIAGNOSIS — M779 Enthesopathy, unspecified: Secondary | ICD-10-CM

## 2015-11-10 MED ORDER — TRIAMCINOLONE ACETONIDE 10 MG/ML IJ SUSP
10.0000 mg | Freq: Once | INTRAMUSCULAR | Status: AC
  Administered 2015-11-10: 10 mg

## 2015-11-10 NOTE — Progress Notes (Signed)
Subjective:     Patient ID: Brandy Saunders, female   DOB: [birth year], 25 y.o.   MRN: [id number]  HPI obese female presents with quite a bit of discomfort in the right ankle and states that she has tried to exercise more but it's been hard to do because of the pain. Does not remember specific injury and it's been hurting for several weeks and mobic helped some but she knows it is still there   Review of Systems  All other systems reviewed and are negative.      Objective:   Physical Exam  Constitutional: She is oriented to person, place, and time.  Cardiovascular: Intact distal pulses.   Musculoskeletal: Normal range of motion.  Neurological: She is oriented to person, place, and time.  Skin: Skin is warm.  Nursing note and vitals reviewed.  neurovascular status intact muscle strength adequate range of motion within normal limits with patient noted to have quite a bit of discomfort in the right ankle sinus tarsi area. There is quite a bit of obesity related enlargement around the ankle and foot and it is quite tender but I did not note significant depression of the arch. Good digital perfusion well oriented 3     Assessment:     Inflammatory sinus tarsitis right with inflammation fluid noted    Plan:     H&P and x-rays reviewed with patient. We discussed obesity and it's relationship to her problems and she is trying to lose weight now which I think will be of great benefit to her long-term. I injected the sinus tarsi 3 mg Kenalog 5 mg Xylocaine and advised on reduced activity for the next few days and if symptoms persist she will be seen back.  X-ray report indicated quite a bit of edema within the foot and ankle secondary to obesity but no indications of collapsed longitudinal arch or arthritis

## 2015-12-28 ENCOUNTER — Encounter: Payer: Self-pay | Admitting: Family Medicine

## 2016-01-06 ENCOUNTER — Ambulatory Visit (INDEPENDENT_AMBULATORY_CARE_PROVIDER_SITE_OTHER): Payer: Managed Care, Other (non HMO) | Admitting: Family Medicine

## 2016-01-06 ENCOUNTER — Encounter: Payer: Self-pay | Admitting: Family Medicine

## 2016-01-06 ENCOUNTER — Other Ambulatory Visit: Payer: Self-pay | Admitting: Emergency Medicine

## 2016-01-06 VITALS — BP 118/62 | HR 79 | Temp 97.3°F | Ht 65.0 in | Wt >= 6400 oz

## 2016-01-06 DIAGNOSIS — R5382 Chronic fatigue, unspecified: Secondary | ICD-10-CM | POA: Insufficient documentation

## 2016-01-06 DIAGNOSIS — E282 Polycystic ovarian syndrome: Secondary | ICD-10-CM

## 2016-01-06 DIAGNOSIS — R5383 Other fatigue: Secondary | ICD-10-CM

## 2016-01-06 MED ORDER — NORETHIN ACE-ETH ESTRAD-FE 1-20 MG-MCG PO TABS
1.0000 | ORAL_TABLET | Freq: Every day | ORAL | Status: DC
Start: 2016-01-06 — End: 2017-03-09

## 2016-01-06 NOTE — Assessment & Plan Note (Signed)
Check labs mvi daily con't exercise

## 2016-01-06 NOTE — Progress Notes (Signed)
Patient ID: Brandy Saunders, female    DOB: 1991-06-26  Age: 25 y.o. MRN: [id number]    Subjective:  Subjective HPI SHANTIL VALLEJO presents for c/o extreme fatigue.   Pt is exercising and eating right.   She has lost 26 lbs since her last ov.    Review of Systems  Constitutional: Negative for diaphoresis, appetite change, fatigue and unexpected weight change.  Eyes: Negative for pain, redness and visual disturbance.  Respiratory: Negative for cough, chest tightness, shortness of breath and wheezing.   Cardiovascular: Negative for chest pain, palpitations and leg swelling.  Endocrine: Negative for cold intolerance, heat intolerance, polydipsia, polyphagia and polyuria.  Genitourinary: Negative for dysuria, frequency and difficulty urinating.  Neurological: Negative for dizziness, light-headedness, numbness and headaches.    History Past Medical History  Diagnosis Date  . Morbidly obese ([place])   . Asthma   . Carpal tunnel syndrome, bilateral   . PCOS (polycystic ovarian syndrome)     She has past surgical history that includes Tonsilectomy, adenoidectomy, bilateral myringotomy and tubes (1994).   Her family history includes Diabetes in her maternal uncle; Heart disease in her maternal grandmother; Hyperlipidemia in her mother; Hypertension in her mother; Stroke in her father. There is no history of Colon cancer.She reports that she has quit smoking. Her smoking use included Cigarettes. She has never used smokeless tobacco. She reports that she drinks about 1.8 oz of alcohol per week. She reports that she uses illicit drugs (Marijuana).  Current Outpatient Prescriptions on File Prior to Visit  Medication Sig Dispense Refill  . fluticasone (FLONASE) 50 MCG/ACT nasal spray Place 2 sprays into both nostrils daily. (Patient not taking: Reported on 01/06/2016) 16 g 1  . meloxicam (MOBIC) 15 MG tablet Take 1 tablet (15 mg total) by mouth daily. (Patient not taking: Reported on  01/06/2016) 30 tablet 0  . montelukast (SINGULAIR) 10 MG tablet Take 1 tablet (10 mg total) by mouth at bedtime. (Patient not taking: Reported on 01/06/2016) 30 tablet 3  . pantoprazole (PROTONIX) 40 MG tablet Take 1 tablet (40 mg total) by mouth daily. Take 30 minutes prior to breakfast. (Patient not taking: Reported on 01/06/2016) 90 tablet 0   No current facility-administered medications on file prior to visit.     Objective:  Objective Physical Exam  Constitutional: She is oriented to person, place, and time. She appears well-developed and well-nourished.  HENT:  Head: Normocephalic and atraumatic.  Eyes: Conjunctivae and EOM are normal.  Neck: Normal range of motion. Neck supple. No JVD present. Carotid bruit is not present. No thyromegaly present.  Cardiovascular: Normal rate, regular rhythm and normal heart sounds.   No murmur heard. Pulmonary/Chest: Effort normal and breath sounds normal. No respiratory distress. She has no wheezes. She has no rales. She exhibits no tenderness.  Musculoskeletal: She exhibits no edema.  Neurological: She is alert and oriented to person, place, and time.  Psychiatric: She has a normal mood and affect. Her behavior is normal. Judgment and thought content normal.  Nursing note and vitals reviewed.  BP 118/62 mmHg  Pulse 79  Temp(Src) 97.3 F (36.3 C) (Oral)  Ht 5' 5"  (1.651 m)  Wt 412 lb 3.2 oz (186.973 kg)  BMI 68.59 kg/m2  SpO2 98%  LMP 12/27/2015 Wt Readings from Last 3 Encounters:  01/06/16 412 lb 3.2 oz (186.973 kg)  10/29/15 440 lb (199.583 kg)  10/01/15 438 lb (198.675 kg)     Lab Results  Component Value Date   WBC  4.5 01/07/2016   HGB 13.0 01/07/2016   HCT 39.3 01/07/2016   PLT 249.0 01/07/2016   GLUCOSE 102* 01/07/2016   CHOL 126 01/07/2016   TRIG 73.0 01/07/2016   HDL 36.70* 01/07/2016   LDLCALC 75 01/07/2016   ALT 21 01/07/2016   AST 15 01/07/2016   NA 137 01/07/2016   K 4.0 01/07/2016   CL 105 01/07/2016    CREATININE 0.65 01/07/2016   BUN 11 01/07/2016   CO2 24 01/07/2016   TSH 2.147 09/28/2014   HGBA1C 5.8 01/07/2016    No results found.   Assessment & Plan:  Plan I have discontinued Ms. Campbell's fluconazole. I am also having her maintain her pantoprazole, fluticasone, montelukast, meloxicam, norethindrone-ethinyl estradiol, Multiple Vitamins-Calcium (ONE-A-DAY WOMENS PO), BIOTIN 5000 PO, and B-12.  Meds ordered this encounter  Medications  . norethindrone-ethinyl estradiol (JUNEL FE 1/20) 1-20 MG-MCG tablet    Sig: Take 1 tablet by mouth daily.    Dispense:  3 Package    Refill:  4  . Multiple Vitamins-Calcium (ONE-A-DAY WOMENS PO)    Sig: Take by mouth.  Marland Kitchen BIOTIN 5000 PO    Sig: Take 10,000 mg by mouth.  . Cyanocobalamin (B-12) 1000 MCG CAPS    Sig: Take by mouth.    Problem List Items Addressed This Visit      Unprioritized   PCOS (polycystic ovarian syndrome) (Chronic)   Relevant Medications   norethindrone-ethinyl estradiol (JUNEL FE 1/20) 1-20 MG-MCG tablet   Other Relevant Orders   CBC w/Diff (Completed)   Comp Met (CMET) (Completed)   Hemoglobin A1C (Completed)   Lipid panel (Completed)   Mononucleosis screen (Completed)   T3, free (Completed)   T4, free (Completed)   Vitamin B12 (Completed)   Vitamin D 1,25 dihydroxy   Epstein-Barr virus VCA antibody panel   Chronic fatigue - Primary    Check labs mvi daily con't exercise      Relevant Orders   CBC w/Diff (Completed)   Comp Met (CMET) (Completed)   Hemoglobin A1C (Completed)   Lipid panel (Completed)   Mononucleosis screen (Completed)   T3, free (Completed)   T4, free (Completed)   Vitamin B12 (Completed)   Vitamin D 1,25 dihydroxy   Epstein-Barr virus VCA antibody panel      Follow-up: Return if symptoms worsen or fail to improve.  Ann Held, DO

## 2016-01-06 NOTE — Patient Instructions (Signed)
Fatigue  Fatigue is feeling tired all of the time, a lack of energy, or a lack of motivation. Occasional or mild fatigue is often a normal response to activity or life in general. However, long-lasting (chronic) or extreme fatigue may indicate an underlying medical condition.  HOME CARE INSTRUCTIONS   Watch your fatigue for any changes. The following actions may help to lessen any discomfort you are feeling:  · Talk to your health care provider about how much sleep you need each night. Try to get the required amount every night.  · Take medicines only as directed by your health care provider.  · Eat a healthy and nutritious diet. Ask your health care provider if you need help changing your diet.  · Drink enough fluid to keep your urine clear or pale yellow.  · Practice ways of relaxing, such as yoga, meditation, massage therapy, or acupuncture.  · Exercise regularly.    · Change situations that cause you stress. Try to keep your work and personal routine reasonable.  · Do not abuse illegal drugs.  · Limit alcohol intake to no more than 1 drink per day for nonpregnant women and 2 drinks per day for men. One drink equals 12 ounces of beer, 5 ounces of wine, or 1½ ounces of hard liquor.  · Take a multivitamin, if directed by your health care provider.  SEEK MEDICAL CARE IF:   · Your fatigue does not get better.  · You have a fever.    · You have unintentional weight loss or gain.  · You have headaches.    · You have difficulty:      Falling asleep.    Sleeping throughout the night.  · You feel angry, guilty, anxious, or sad.     · You are unable to have a bowel movement (constipation).    · You skin is dry.     · Your legs or another part of your body is swollen.    SEEK IMMEDIATE MEDICAL CARE IF:   · You feel confused.    · Your vision is blurry.  · You feel faint or pass out.    · You have a severe headache.    · You have severe abdominal, pelvic, or back pain.    · You have chest pain, shortness of breath, or an  irregular or fast heartbeat.    · You are unable to urinate or you urinate less than normal.    · You develop abnormal bleeding, such as bleeding from the rectum, vagina, nose, lungs, or nipples.  · You vomit blood.     · You have thoughts about harming yourself or committing suicide.    · You are worried that you might harm someone else.       This information is not intended to replace advice given to you by your health care provider. Make sure you discuss any questions you have with your health care provider.     Document Released: 06/11/2007 Document Revised: 09/04/2014 Document Reviewed: 12/16/2013  Elsevier Interactive Patient Education ©2016 [employer]

## 2016-01-07 ENCOUNTER — Other Ambulatory Visit (INDEPENDENT_AMBULATORY_CARE_PROVIDER_SITE_OTHER): Payer: Managed Care, Other (non HMO)

## 2016-01-07 DIAGNOSIS — E282 Polycystic ovarian syndrome: Secondary | ICD-10-CM

## 2016-01-07 DIAGNOSIS — R5382 Chronic fatigue, unspecified: Secondary | ICD-10-CM

## 2016-01-07 LAB — COMPREHENSIVE METABOLIC PANEL
ALT: 21 U/L (ref 0–35)
AST: 15 U/L (ref 0–37)
Albumin: 4.1 g/dL (ref 3.5–5.2)
Alkaline Phosphatase: 46 U/L (ref 39–117)
BILIRUBIN TOTAL: 0.6 mg/dL (ref 0.2–1.2)
BUN: 11 mg/dL (ref 6–23)
CALCIUM: 9.2 mg/dL (ref 8.4–10.5)
CHLORIDE: 105 meq/L (ref 96–112)
CO2: 24 meq/L (ref 19–32)
Creatinine, Ser: 0.65 mg/dL (ref 0.40–1.20)
GFR: 141.96 mL/min (ref >60.00)
Glucose, Bld: 102 mg/dL — ABNORMAL HIGH (ref 70–99)
POTASSIUM: 4 meq/L (ref 3.5–5.1)
Sodium: 137 mEq/L (ref 135–145)
Total Protein: 6.9 g/dL (ref 6.0–8.3)

## 2016-01-07 LAB — CBC WITH DIFFERENTIAL/PLATELET
BASOS ABS: 0 10*3/uL (ref 0.0–0.1)
Basophils Relative: 0.4 % (ref 0.0–3.0)
Eosinophils Absolute: 0.1 10*3/uL (ref 0.0–0.7)
Eosinophils Relative: 2 % (ref 0.0–5.0)
HEMATOCRIT: 39.3 % (ref 36.0–46.0)
Hemoglobin: 13 g/dL (ref 12.0–15.0)
LYMPHS ABS: 2.2 10*3/uL (ref 0.7–4.0)
LYMPHS PCT: 50.1 % — AB (ref 12.0–46.0)
MCHC: 33.2 g/dL (ref 30.0–36.0)
MCV: 81.9 fl (ref 78.0–100.0)
MONOS PCT: 5.1 % (ref 3.0–12.0)
Monocytes Absolute: 0.2 10*3/uL (ref 0.1–1.0)
NEUTROS PCT: 42.4 % — AB (ref 43.0–77.0)
Neutro Abs: 1.9 10*3/uL (ref 1.4–7.7)
Platelets: 249 10*3/uL (ref 150.0–400.0)
RBC: 4.79 Mil/uL (ref 3.87–5.11)
RDW: 14.5 % (ref 11.5–15.5)
WBC: 4.5 10*3/uL (ref 4.0–10.5)

## 2016-01-07 LAB — LIPID PANEL
CHOL/HDL RATIO: 3
Cholesterol: 126 mg/dL (ref 0–200)
HDL: 36.7 mg/dL — ABNORMAL LOW (ref >39.00)
LDL Cholesterol: 75 mg/dL (ref 0–99)
NONHDL: 89.6
Triglycerides: 73 mg/dL (ref 0.0–149.0)
VLDL: 14.6 mg/dL (ref 0.0–40.0)

## 2016-01-07 LAB — MONONUCLEOSIS SCREEN: Mono Screen: NEGATIVE

## 2016-01-07 LAB — T4, FREE: FREE T4: 1.46 ng/dL (ref 0.60–1.60)

## 2016-01-07 LAB — VITAMIN B12: VITAMIN B 12: 862 pg/mL (ref 211–911)

## 2016-01-07 LAB — HEMOGLOBIN A1C: Hgb A1c MFr Bld: 5.8 % (ref 4.6–6.5)

## 2016-01-07 LAB — T3, FREE: T3, Free: 5.5 pg/mL — ABNORMAL HIGH (ref 2.3–4.2)

## 2016-01-10 LAB — EPSTEIN-BARR VIRUS VCA ANTIBODY PANEL
EBV NA IGG: 197 U/mL — AB (ref <18.0)
EBV VCA IGG: 213 U/mL — AB (ref <18.0)

## 2016-01-10 LAB — VITAMIN D 1,25 DIHYDROXY
Vitamin D 1, 25 (OH)2 Total: 47 pg/mL (ref 18–72)
Vitamin D2 1, 25 (OH)2: <8 pg/mL
Vitamin D3 1, 25 (OH)2: 47 pg/mL

## 2016-03-23 ENCOUNTER — Ambulatory Visit (INDEPENDENT_AMBULATORY_CARE_PROVIDER_SITE_OTHER): Payer: Managed Care, Other (non HMO) | Admitting: Family Medicine

## 2016-03-23 ENCOUNTER — Encounter: Payer: Self-pay | Admitting: Family Medicine

## 2016-03-23 VITALS — BP 134/73 | HR 55 | Temp 99.8°F | Wt 391.4 lb

## 2016-03-23 DIAGNOSIS — J069 Acute upper respiratory infection, unspecified: Secondary | ICD-10-CM | POA: Diagnosis not present

## 2016-03-23 DIAGNOSIS — J029 Acute pharyngitis, unspecified: Secondary | ICD-10-CM

## 2016-03-23 LAB — POCT RAPID STREP A (OFFICE): Rapid Strep A Screen: NEGATIVE

## 2016-03-23 MED ORDER — FLUTICASONE PROPIONATE 50 MCG/ACT NA SUSP: 2.0000 | Freq: Every day | NASAL | 6 refills | Status: DC

## 2016-03-23 MED ORDER — LEVOCETIRIZINE DIHYDROCHLORIDE 5 MG PO TABS
5.0000 mg | ORAL_TABLET | Freq: Every evening | ORAL | 5 refills | Status: DC
Start: 2016-03-23 — End: 2017-10-11

## 2016-03-23 MED ORDER — CEFUROXIME AXETIL 500 MG PO TABS: 500.0000 mg | ORAL_TABLET | Freq: Two times a day (BID) | ORAL | 0 refills | Status: DC

## 2016-03-23 NOTE — Patient Instructions (Signed)

## 2016-03-23 NOTE — Progress Notes (Signed)
Patient ID: Brandy Saunders, female    DOB: 1990-10-27  Age: 25 y.o. MRN: [id number]    Subjective:  Subjective  HPI Brandy Saunders presents for sore throat x > 1 week -- - + congestion  Pt was put on amoxicillin by [facility] 2 weeks ago.  She did get better but symptoms started back few days ago. Pt is on claritin.    Review of Systems  Constitutional: Positive for chills. Negative for fever.  HENT: Positive for congestion, postnasal drip, rhinorrhea and sore throat. Negative for sinus pressure and sneezing.   Respiratory: Negative for cough, chest tightness, shortness of breath and wheezing.   Cardiovascular: Negative for chest pain, palpitations and leg swelling.  Allergic/Immunologic: Negative for environmental allergies.    History Past Medical History:  Diagnosis Date  . Asthma   . Carpal tunnel syndrome, bilateral   . Morbidly obese ([place])   . PCOS (polycystic ovarian syndrome)     She has a past surgical history that includes Tonsilectomy, adenoidectomy, bilateral myringotomy and tubes (1994).   Her family history includes Diabetes in her maternal uncle; Heart disease in her maternal grandmother; Hyperlipidemia in her mother; Hypertension in her mother; Stroke in her father.She reports that she has quit smoking. Her smoking use included Cigarettes. She has never used smokeless tobacco. She reports that she drinks about 1.8 oz of alcohol per week . She reports that she uses drugs, including Marijuana.  Current Outpatient Prescriptions on File Prior to Visit  Medication Sig Dispense Refill  . BIOTIN 5000 PO Take 10,000 mg by mouth.    . Cyanocobalamin (B-12) 1000 MCG CAPS Take by mouth.    . Multiple Vitamins-Calcium (ONE-A-DAY WOMENS PO) Take by mouth.    . norethindrone-ethinyl estradiol (JUNEL FE 1/20) 1-20 MG-MCG tablet Take 1 tablet by mouth daily. 3 Package 4   No current facility-administered medications on file prior to visit.      Objective:  Objective  Physical  Exam  Constitutional: She is oriented to person, place, and time. She appears well-developed and well-nourished.  HENT:  Right Ear: External ear normal.  Left Ear: External ear normal.  + PND + errythema  Eyes: Conjunctivae are normal. Right eye exhibits no discharge. Left eye exhibits no discharge.  Cardiovascular: Normal rate, regular rhythm and normal heart sounds.   No murmur heard. Pulmonary/Chest: Effort normal and breath sounds normal. No respiratory distress. She has no wheezes. She has no rales. She exhibits no tenderness.  Musculoskeletal: She exhibits no edema.  Lymphadenopathy:    She has cervical adenopathy.  Neurological: She is alert and oriented to person, place, and time.  Psychiatric: She has a normal mood and affect. Her behavior is normal. Judgment and thought content normal.  Nursing note and vitals reviewed.  BP 134/73 (BP Location: Right Arm, Patient Position: Sitting, Cuff Size: Large)   Pulse (!) 55   Temp 99.8 F (37.7 C) (Oral)   Wt (!) 391 lb 6.4 oz (177.5 kg)   LMP 03/23/2016   SpO2 98%   BMI 65.13 kg/m  Wt Readings from Last 3 Encounters:  03/23/16 (!) 391 lb 6.4 oz (177.5 kg)  01/06/16 (!) 412 lb 3.2 oz (187 kg)  10/29/15 (!) 440 lb (199.6 kg)     Lab Results  Component Value Date   WBC 4.5 01/07/2016   HGB 13.0 01/07/2016   HCT 39.3 01/07/2016   PLT 249.0 01/07/2016   GLUCOSE 102 (H) 01/07/2016   CHOL 126 01/07/2016  TRIG 73.0 01/07/2016   HDL 36.70 (L) 01/07/2016   LDLCALC 75 01/07/2016   ALT 21 01/07/2016   AST 15 01/07/2016   NA 137 01/07/2016   K 4.0 01/07/2016   CL 105 01/07/2016   CREATININE 0.65 01/07/2016   BUN 11 01/07/2016   CO2 24 01/07/2016   TSH 2.147 09/28/2014   HGBA1C 5.8 01/07/2016    No results found.   Assessment & Plan:  Plan  I have discontinued Ms. Campbell's pantoprazole, fluticasone, montelukast, and meloxicam. I am also having her start on cefUROXime, levocetirizine, and fluticasone. Additionally, I  am having her maintain her norethindrone-ethinyl estradiol, Multiple Vitamins-Calcium (ONE-A-DAY WOMENS PO), BIOTIN 5000 PO, B-12, and cholecalciferol.  Meds ordered this encounter  Medications  . cholecalciferol (VITAMIN D) 1000 units tablet    Sig: Take 1,000 Units by mouth daily.  . cefUROXime (CEFTIN) 500 MG tablet    Sig: Take 1 tablet (500 mg total) by mouth 2 (two) times daily with a meal.    Dispense:  20 tablet    Refill:  0  . levocetirizine (XYZAL) 5 MG tablet    Sig: Take 1 tablet (5 mg total) by mouth every evening.    Dispense:  30 tablet    Refill:  5  . fluticasone (FLONASE) 50 MCG/ACT nasal spray    Sig: Place 2 sprays into both nostrils daily.    Dispense:  16 g    Refill:  6    Problem List Items Addressed This Visit      Unprioritized   Acute upper respiratory infection   Relevant Medications   cefUROXime (CEFTIN) 500 MG tablet   levocetirizine (XYZAL) 5 MG tablet   fluticasone (FLONASE) 50 MCG/ACT nasal spray    Other Visit Diagnoses    Sore throat    -  Primary   Relevant Medications   cefUROXime (CEFTIN) 500 MG tablet   Other Relevant Orders   POCT rapid strep A (Completed)   Comprehensive metabolic panel   CBC with Differential/Platelet   Epstein-Barr virus VCA antibody panel   Monospot      Follow-up: No Follow-up on file.  Ann Held, DO

## 2016-03-24 LAB — CBC WITH DIFFERENTIAL/PLATELET
BASOS ABS: 0 10*3/uL (ref 0.0–0.1)
BASOS PCT: 0.2 % (ref 0.0–3.0)
EOS PCT: 1.4 % (ref 0.0–5.0)
Eosinophils Absolute: 0.2 10*3/uL (ref 0.0–0.7)
HEMATOCRIT: 39.7 % (ref 36.0–46.0)
Hemoglobin: 13.3 g/dL (ref 12.0–15.0)
LYMPHS PCT: 21 % (ref 12.0–46.0)
Lymphs Abs: 2.4 10*3/uL (ref 0.7–4.0)
MCHC: 33.4 g/dL (ref 30.0–36.0)
MCV: 81.1 fl (ref 78.0–100.0)
MONOS PCT: 1.6 % — AB (ref 3.0–12.0)
Monocytes Absolute: 0.2 10*3/uL (ref 0.1–1.0)
NEUTROS ABS: 8.8 10*3/uL — AB (ref 1.4–7.7)
Neutrophils Relative %: 75.8 % (ref 43.0–77.0)
PLATELETS: 279 10*3/uL (ref 150.0–400.0)
RBC: 4.9 Mil/uL (ref 3.87–5.11)
RDW: 14.3 % (ref 11.5–15.5)
WBC: 11.6 10*3/uL — ABNORMAL HIGH (ref 4.0–10.5)

## 2016-03-24 LAB — COMPREHENSIVE METABOLIC PANEL
ALT: 32 U/L (ref 0–35)
AST: 30 U/L (ref 0–37)
Albumin: 4.2 g/dL (ref 3.5–5.2)
Alkaline Phosphatase: 46 U/L (ref 39–117)
BILIRUBIN TOTAL: 0.3 mg/dL (ref 0.2–1.2)
BUN: 21 mg/dL (ref 6–23)
CO2: 26 meq/L (ref 19–32)
Calcium: 9.5 mg/dL (ref 8.4–10.5)
Chloride: 105 mEq/L (ref 96–112)
Creatinine, Ser: 0.79 mg/dL (ref 0.40–1.20)
GFR: 113.16 mL/min (ref >60.00)
GLUCOSE: 85 mg/dL (ref 70–99)
Potassium: 4.1 mEq/L (ref 3.5–5.1)
Sodium: 140 mEq/L (ref 135–145)
Total Protein: 7.5 g/dL (ref 6.0–8.3)

## 2016-03-24 LAB — EPSTEIN-BARR VIRUS VCA ANTIBODY PANEL
EBV NA IGG: 229 U/mL — AB
EBV VCA IgG: 233 U/mL — ABNORMAL HIGH
EBV VCA IgM: <36 U/mL

## 2016-03-24 LAB — MONONUCLEOSIS SCREEN: Mono Screen: NEGATIVE

## 2016-03-27 ENCOUNTER — Encounter: Payer: Self-pay | Admitting: Family Medicine

## 2016-03-27 NOTE — Telephone Encounter (Signed)
Please review and advise     KP 

## 2016-04-11 ENCOUNTER — Encounter: Payer: Self-pay | Admitting: Family Medicine

## 2016-04-11 ENCOUNTER — Ambulatory Visit (INDEPENDENT_AMBULATORY_CARE_PROVIDER_SITE_OTHER): Payer: Managed Care, Other (non HMO) | Admitting: Family Medicine

## 2016-04-11 ENCOUNTER — Other Ambulatory Visit (HOSPITAL_COMMUNITY)
Admission: RE | Admit: 2016-04-11 | Discharge: 2016-04-11 | Disposition: A | Discharge status: Home / Self Care | Payer: Managed Care, Other (non HMO) | Source: Ambulatory Visit | Attending: Family Medicine | Admitting: Family Medicine

## 2016-04-11 VITALS — BP 126/76 | HR 82 | Temp 98.0°F | Wt >= 6400 oz

## 2016-04-11 DIAGNOSIS — Z202 Contact with and (suspected) exposure to infections with a predominantly sexual mode of transmission: Secondary | ICD-10-CM | POA: Diagnosis not present

## 2016-04-11 DIAGNOSIS — N760 Acute vaginitis: Secondary | ICD-10-CM | POA: Diagnosis present

## 2016-04-11 DIAGNOSIS — Z124 Encounter for screening for malignant neoplasm of cervix: Secondary | ICD-10-CM | POA: Diagnosis not present

## 2016-04-11 DIAGNOSIS — Z113 Encounter for screening for infections with a predominantly sexual mode of transmission: Secondary | ICD-10-CM | POA: Diagnosis present

## 2016-04-11 DIAGNOSIS — Z0000 Encounter for general adult medical examination without abnormal findings: Secondary | ICD-10-CM

## 2016-04-11 DIAGNOSIS — Z01419 Encounter for gynecological examination (general) (routine) without abnormal findings: Secondary | ICD-10-CM | POA: Insufficient documentation

## 2016-04-11 DIAGNOSIS — Z1151 Encounter for screening for human papillomavirus (HPV): Secondary | ICD-10-CM | POA: Diagnosis not present

## 2016-04-11 LAB — POCT URINALYSIS DIPSTICK
Blood, UA: NEGATIVE
Glucose, UA: NEGATIVE
KETONES UA: NEGATIVE
LEUKOCYTES UA: NEGATIVE
Nitrite, UA: NEGATIVE
Protein, UA: NEGATIVE
Spec Grav, UA: >=1.03
Urobilinogen, UA: 0.2
pH, UA: 6

## 2016-04-11 NOTE — Patient Instructions (Signed)
Preventive Care for Adults, Female A healthy lifestyle and preventive care can promote health and wellness. Preventive health guidelines for women include the following key practices.  A routine yearly physical is a good way to check with your health care provider about your health and preventive screening. It is a chance to share any concerns and updates on your health and to receive a thorough exam.  Visit your dentist for a routine exam and preventive care every 6 months. Brush your teeth twice a day and floss once a day. Good oral hygiene prevents tooth decay and gum disease.  The frequency of eye exams is based on your age, health, family medical history, use of contact lenses, and other factors. Follow your health care provider's recommendations for frequency of eye exams.  Eat a healthy diet. Foods like vegetables, fruits, whole grains, low-fat dairy products, and lean protein foods contain the nutrients you need without too many calories. Decrease your intake of foods high in solid fats, added sugars, and salt. Eat the right amount of calories for you.Get information about a proper diet from your health care provider, if necessary.  Regular physical exercise is one of the most important things you can do for your health. Most adults should get at least 150 minutes of moderate-intensity exercise (any activity that increases your heart rate and causes you to sweat) each week. In addition, most adults need muscle-strengthening exercises on 2 or more days a week.  Maintain a healthy weight. The body mass index (BMI) is a screening tool to identify possible weight problems. It provides an estimate of body fat based on height and weight. Your health care provider can find your BMI and can help you achieve or maintain a healthy weight.For adults 20 years and older:  A BMI below 18.5 is considered underweight.  A BMI of 18.5 to 24.9 is normal.  A BMI of 25 to 29.9 is considered overweight.  A  BMI of 30 and above is considered obese.  Maintain normal blood lipids and cholesterol levels by exercising and minimizing your intake of saturated fat. Eat a balanced diet with plenty of fruit and vegetables. Blood tests for lipids and cholesterol should begin at age 45 and be repeated every 5 years. If your lipid or cholesterol levels are high, you are over 50, or you are at high risk for heart disease, you may need your cholesterol levels checked more frequently.Ongoing high lipid and cholesterol levels should be treated with medicines if diet and exercise are not working.  If you smoke, find out from your health care provider how to quit. If you do not use tobacco, do not start.  Lung cancer screening is recommended for adults aged 45-80 years who are at high risk for developing lung cancer because of a history of smoking. A yearly low-dose CT scan of the lungs is recommended for people who have at least a 30-pack-year history of smoking and are a current smoker or have quit within the past 15 years. A pack year of smoking is smoking an average of 1 pack of cigarettes a day for 1 year (for example: 1 pack a day for 30 years or 2 packs a day for 15 years). Yearly screening should continue until the smoker has stopped smoking for at least 15 years. Yearly screening should be stopped for people who develop a health problem that would prevent them from having lung cancer treatment.  If you are pregnant, do not drink alcohol. If you are  breastfeeding, be very cautious about drinking alcohol. If you are not pregnant and choose to drink alcohol, do not have more than 1 drink per day. One drink is considered to be 12 ounces (355 mL) of beer, 5 ounces (148 mL) of wine, or 1.5 ounces (44 mL) of liquor.  Avoid use of street drugs. Do not share needles with anyone. Ask for help if you need support or instructions about stopping the use of drugs.  High blood pressure causes heart disease and increases the risk  of stroke. Your blood pressure should be checked at least every 1 to 2 years. Ongoing high blood pressure should be treated with medicines if weight loss and exercise do not work.  If you are 55-79 years old, ask your health care provider if you should take aspirin to prevent strokes.  Diabetes screening is done by taking a blood sample to check your blood glucose level after you have not eaten for a certain period of time (fasting). If you are not overweight and you do not have risk factors for diabetes, you should be screened once every 3 years starting at age 45. If you are overweight or obese and you are 40-70 years of age, you should be screened for diabetes every year as part of your cardiovascular risk assessment.  Breast cancer screening is essential preventive care for women. You should practice "breast self-awareness." This means understanding the normal appearance and feel of your breasts and may include breast self-examination. Any changes detected, no matter how small, should be reported to a health care provider. Women in their 20s and 30s should have a clinical breast exam (CBE) by a health care provider as part of a regular health exam every 1 to 3 years. After age 40, women should have a CBE every year. Starting at age 40, women should consider having a mammogram (breast X-ray test) every year. Women who have a family history of breast cancer should talk to their health care provider about genetic screening. Women at a high risk of breast cancer should talk to their health care providers about having an MRI and a mammogram every year.  Breast cancer gene (BRCA)-related cancer risk assessment is recommended for women who have family members with BRCA-related cancers. BRCA-related cancers include breast, ovarian, tubal, and peritoneal cancers. Having family members with these cancers may be associated with an increased risk for harmful changes (mutations) in the breast cancer genes BRCA1 and  BRCA2. Results of the assessment will determine the need for genetic counseling and BRCA1 and BRCA2 testing.  Your health care provider may recommend that you be screened regularly for cancer of the pelvic organs (ovaries, uterus, and vagina). This screening involves a pelvic examination, including checking for microscopic changes to the surface of your cervix (Pap test). You may be encouraged to have this screening done every 3 years, beginning at age 21.  For women ages 30-65, health care providers may recommend pelvic exams and Pap testing every 3 years, or they may recommend the Pap and pelvic exam, combined with testing for human papilloma virus (HPV), every 5 years. Some types of HPV increase your risk of cervical cancer. Testing for HPV may also be done on women of any age with unclear Pap test results.  Other health care providers may not recommend any screening for nonpregnant women who are considered low risk for pelvic cancer and who do not have symptoms. Ask your health care provider if a screening pelvic exam is right for   you.  If you have had past treatment for cervical cancer or a condition that could lead to cancer, you need Pap tests and screening for cancer for at least 20 years after your treatment. If Pap tests have been discontinued, your risk factors (such as having a new sexual partner) need to be reassessed to determine if screening should resume. Some women have medical problems that increase the chance of getting cervical cancer. In these cases, your health care provider may recommend more frequent screening and Pap tests.  Colorectal cancer can be detected and often prevented. Most routine colorectal cancer screening begins at the age of 50 years and continues through age 75 years. However, your health care provider may recommend screening at an earlier age if you have risk factors for colon cancer. On a yearly basis, your health care provider may provide home test kits to check  for hidden blood in the stool. Use of a small camera at the end of a tube, to directly examine the colon (sigmoidoscopy or colonoscopy), can detect the earliest forms of colorectal cancer. Talk to your health care provider about this at age 50, when routine screening begins. Direct exam of the colon should be repeated every 5-10 years through age 75 years, unless early forms of precancerous polyps or small growths are found.  People who are at an increased risk for hepatitis B should be screened for this virus. You are considered at high risk for hepatitis B if:  You were born in a country where hepatitis B occurs often. Talk with your health care provider about which countries are considered high risk.  Your parents were born in a high-risk country and you have not received a shot to protect against hepatitis B (hepatitis B vaccine).  You have HIV or AIDS.  You use needles to inject street drugs.  You live with, or have sex with, someone who has hepatitis B.  You get hemodialysis treatment.  You take certain medicines for conditions like cancer, organ transplantation, and autoimmune conditions.  Hepatitis C blood testing is recommended for all people born from 1945 through 1965 and any individual with known risks for hepatitis C.  Practice safe sex. Use condoms and avoid high-risk sexual practices to reduce the spread of sexually transmitted infections (STIs). STIs include gonorrhea, chlamydia, syphilis, trichomonas, herpes, HPV, and human immunodeficiency virus (HIV). Herpes, HIV, and HPV are viral illnesses that have no cure. They can result in disability, cancer, and death.  You should be screened for sexually transmitted illnesses (STIs) including gonorrhea and chlamydia if:  You are sexually active and are younger than 24 years.  You are older than 24 years and your health care provider tells you that you are at risk for this type of infection.  Your sexual activity has changed  since you were last screened and you are at an increased risk for chlamydia or gonorrhea. Ask your health care provider if you are at risk.  If you are at risk of being infected with HIV, it is recommended that you take a prescription medicine daily to prevent HIV infection. This is called preexposure prophylaxis (PrEP). You are considered at risk if:  You are sexually active and do not regularly use condoms or know the HIV status of your partner(s).  You take drugs by injection.  You are sexually active with a partner who has HIV.  Talk with your health care provider about whether you are at high risk of being infected with HIV. If   you choose to begin PrEP, you should first be tested for HIV. You should then be tested every 3 months for as long as you are taking PrEP.  Osteoporosis is a disease in which the bones lose minerals and strength with aging. This can result in serious bone fractures or breaks. The risk of osteoporosis can be identified using a bone density scan. Women ages 67 years and over and women at risk for fractures or osteoporosis should discuss screening with their health care providers. Ask your health care provider whether you should take a calcium supplement or vitamin D to reduce the rate of osteoporosis.  Menopause can be associated with physical symptoms and risks. Hormone replacement therapy is available to decrease symptoms and risks. You should talk to your health care provider about whether hormone replacement therapy is right for you.  Use sunscreen. Apply sunscreen liberally and repeatedly throughout the day. You should seek shade when your shadow is shorter than you. Protect yourself by wearing long sleeves, pants, a wide-brimmed hat, and sunglasses year round, whenever you are outdoors.  Once a month, do a whole body skin exam, using a mirror to look at the skin on your back. Tell your health care provider of new moles, moles that have irregular borders, moles that  are larger than a pencil eraser, or moles that have changed in shape or color.  Stay current with required vaccines (immunizations).  Influenza vaccine. All adults should be immunized every year.  Tetanus, diphtheria, and acellular pertussis (Td, Tdap) vaccine. Pregnant women should receive 1 dose of Tdap vaccine during each pregnancy. The dose should be obtained regardless of the length of time since the last dose. Immunization is preferred during the 27th-36th week of gestation. An adult who has not previously received Tdap or who does not know her vaccine status should receive 1 dose of Tdap. This initial dose should be followed by tetanus and diphtheria toxoids (Td) booster doses every 10 years. Adults with an unknown or incomplete history of completing a 3-dose immunization series with Td-containing vaccines should begin or complete a primary immunization series including a Tdap dose. Adults should receive a Td booster every 10 years.  Varicella vaccine. An adult without evidence of immunity to varicella should receive 2 doses or a second dose if she has previously received 1 dose. Pregnant females who do not have evidence of immunity should receive the first dose after pregnancy. This first dose should be obtained before leaving the health care facility. The second dose should be obtained 4-8 weeks after the first dose.  Human papillomavirus (HPV) vaccine. Females aged 13-26 years who have not received the vaccine previously should obtain the 3-dose series. The vaccine is not recommended for use in pregnant females. However, pregnancy testing is not needed before receiving a dose. If a female is found to be pregnant after receiving a dose, no treatment is needed. In that case, the remaining doses should be delayed until after the pregnancy. Immunization is recommended for any person with an immunocompromised condition through the age of 61 years if she did not get any or all doses earlier. During the  3-dose series, the second dose should be obtained 4-8 weeks after the first dose. The third dose should be obtained 24 weeks after the first dose and 16 weeks after the second dose.  Zoster vaccine. One dose is recommended for adults aged 30 years or older unless certain conditions are present.  Measles, mumps, and rubella (MMR) vaccine. Adults born  before 1957 generally are considered immune to measles and mumps. Adults born in 1957 or later should have 1 or more doses of MMR vaccine unless there is a contraindication to the vaccine or there is laboratory evidence of immunity to each of the three diseases. A routine second dose of MMR vaccine should be obtained at least 28 days after the first dose for students attending postsecondary schools, health care workers, or international travelers. People who received inactivated measles vaccine or an unknown type of measles vaccine during 1963-1967 should receive 2 doses of MMR vaccine. People who received inactivated mumps vaccine or an unknown type of mumps vaccine before 1979 and are at high risk for mumps infection should consider immunization with 2 doses of MMR vaccine. For females of childbearing age, rubella immunity should be determined. If there is no evidence of immunity, females who are not pregnant should be vaccinated. If there is no evidence of immunity, females who are pregnant should delay immunization until after pregnancy. Unvaccinated health care workers born before 1957 who lack laboratory evidence of measles, mumps, or rubella immunity or laboratory confirmation of disease should consider measles and mumps immunization with 2 doses of MMR vaccine or rubella immunization with 1 dose of MMR vaccine.  Pneumococcal 13-valent conjugate (PCV13) vaccine. When indicated, a person who is uncertain of his immunization history and has no record of immunization should receive the PCV13 vaccine. All adults 65 years of age and older should receive this  vaccine. An adult aged 19 years or older who has certain medical conditions and has not been previously immunized should receive 1 dose of PCV13 vaccine. This PCV13 should be followed with a dose of pneumococcal polysaccharide (PPSV23) vaccine. Adults who are at high risk for pneumococcal disease should obtain the PPSV23 vaccine at least 8 weeks after the dose of PCV13 vaccine. Adults older than 26 years of age who have normal immune system function should obtain the PPSV23 vaccine dose at least 1 year after the dose of PCV13 vaccine.  Pneumococcal polysaccharide (PPSV23) vaccine. When PCV13 is also indicated, PCV13 should be obtained first. All adults aged 65 years and older should be immunized. An adult younger than age 65 years who has certain medical conditions should be immunized. Any person who resides in a nursing home or long-term care facility should be immunized. An adult smoker should be immunized. People with an immunocompromised condition and certain other conditions should receive both PCV13 and PPSV23 vaccines. People with human immunodeficiency virus (HIV) infection should be immunized as soon as possible after diagnosis. Immunization during chemotherapy or radiation therapy should be avoided. Routine use of PPSV23 vaccine is not recommended for American Indians, [place] Natives, or people younger than 65 years unless there are medical conditions that require PPSV23 vaccine. When indicated, people who have unknown immunization and have no record of immunization should receive PPSV23 vaccine. One-time revaccination 5 years after the first dose of PPSV23 is recommended for people aged 19-64 years who have chronic kidney failure, nephrotic syndrome, asplenia, or immunocompromised conditions. People who received 1-2 doses of PPSV23 before age 65 years should receive another dose of PPSV23 vaccine at age 65 years or later if at least 5 years have passed since the previous dose. Doses of PPSV23 are not  needed for people immunized with PPSV23 at or after age 65 years.  Meningococcal vaccine. Adults with asplenia or persistent complement component deficiencies should receive 2 doses of quadrivalent meningococcal conjugate (MenACWY-D) vaccine. The doses should be obtained   at least 2 months apart. Microbiologists working with certain meningococcal bacteria, [place] recruits, people at risk during an outbreak, and people who travel to or live in countries with a high rate of meningitis should be immunized. A first-year college student up through age 34 years who is living in a residence hall should receive a dose if she did not receive a dose on or after her 16th birthday. Adults who have certain high-risk conditions should receive one or more doses of vaccine.  Hepatitis A vaccine. Adults who wish to be protected from this disease, have certain high-risk conditions, work with hepatitis A-infected animals, work in hepatitis A research labs, or travel to or work in countries with a high rate of hepatitis A should be immunized. Adults who were previously unvaccinated and who anticipate close contact with an international adoptee during the first 60 days after arrival in the [place] from a country with a high rate of hepatitis A should be immunized.  Hepatitis B vaccine. Adults who wish to be protected from this disease, have certain high-risk conditions, may be exposed to blood or other infectious body fluids, are household contacts or sex partners of hepatitis B positive people, are clients or workers in certain care facilities, or travel to or work in countries with a high rate of hepatitis B should be immunized.  Haemophilus influenzae type b (Hib) vaccine. A previously unvaccinated person with asplenia or sickle cell disease or having a scheduled splenectomy should receive 1 dose of Hib vaccine. Regardless of previous immunization, a recipient of a hematopoietic stem cell transplant should receive a  3-dose series 6-12 months after her successful transplant. Hib vaccine is not recommended for adults with HIV infection. Preventive Services / Frequency Ages 35 to 4 years  Blood pressure check.** / Every 3-5 years.  Lipid and cholesterol check.** / Every 5 years beginning at age 60.  Clinical breast exam.** / Every 3 years for women in their 71s and 10s.  BRCA-related cancer risk assessment.** / For women who have family members with a BRCA-related cancer (breast, ovarian, tubal, or peritoneal cancers).  Pap test.** / Every 2 years from ages 76 through 26. Every 3 years starting at age 61 through age 76 or 93 with a history of 3 consecutive normal Pap tests.  HPV screening.** / Every 3 years from ages 37 through ages 60 to 51 with a history of 3 consecutive normal Pap tests.  Hepatitis C blood test.** / For any individual with known risks for hepatitis C.  Skin self-exam. / Monthly.  Influenza vaccine. / Every year.  Tetanus, diphtheria, and acellular pertussis (Tdap, Td) vaccine.** / Consult your health care provider. Pregnant women should receive 1 dose of Tdap vaccine during each pregnancy. 1 dose of Td every 10 years.  Varicella vaccine.** / Consult your health care provider. Pregnant females who do not have evidence of immunity should receive the first dose after pregnancy.  HPV vaccine. / 3 doses over 6 months, if 93 and younger. The vaccine is not recommended for use in pregnant females. However, pregnancy testing is not needed before receiving a dose.  Measles, mumps, rubella (MMR) vaccine.** / You need at least 1 dose of MMR if you were born in 1957 or later. You may also need a 2nd dose. For females of childbearing age, rubella immunity should be determined. If there is no evidence of immunity, females who are not pregnant should be vaccinated. If there is no evidence of immunity, females who are  pregnant should delay immunization until after pregnancy.  Pneumococcal  13-valent conjugate (PCV13) vaccine.** / Consult your health care provider.  Pneumococcal polysaccharide (PPSV23) vaccine.** / 1 to 2 doses if you smoke cigarettes or if you have certain conditions.  Meningococcal vaccine.** / 1 dose if you are age 68 to 8 years and a Market researcher living in a residence hall, or have one of several medical conditions, you need to get vaccinated against meningococcal disease. You may also need additional booster doses.  Hepatitis A vaccine.** / Consult your health care provider.  Hepatitis B vaccine.** / Consult your health care provider.  Haemophilus influenzae type b (Hib) vaccine.** / Consult your health care provider. Ages 7 to 53 years  Blood pressure check.** / Every year.  Lipid and cholesterol check.** / Every 5 years beginning at age 25 years.  Lung cancer screening. / Every year if you are aged 11-80 years and have a 30-pack-year history of smoking and currently smoke or have quit within the past 15 years. Yearly screening is stopped once you have quit smoking for at least 15 years or develop a health problem that would prevent you from having lung cancer treatment.  Clinical breast exam.** / Every year after age 48 years.  BRCA-related cancer risk assessment.** / For women who have family members with a BRCA-related cancer (breast, ovarian, tubal, or peritoneal cancers).  Mammogram.** / Every year beginning at age 41 years and continuing for as long as you are in good health. Consult with your health care provider.  Pap test.** / Every 3 years starting at age 65 years through age 37 or 70 years with a history of 3 consecutive normal Pap tests.  HPV screening.** / Every 3 years from ages 72 years through ages 60 to 40 years with a history of 3 consecutive normal Pap tests.  Fecal occult blood test (FOBT) of stool. / Every year beginning at age 21 years and continuing until age 5 years. You may not need to do this test if you get  a colonoscopy every 10 years.  Flexible sigmoidoscopy or colonoscopy.** / Every 5 years for a flexible sigmoidoscopy or every 10 years for a colonoscopy beginning at age 35 years and continuing until age 48 years.  Hepatitis C blood test.** / For all people born from [birth year] through 1965 and any individual with known risks for hepatitis C.  Skin self-exam. / Monthly.  Influenza vaccine. / Every year.  Tetanus, diphtheria, and acellular pertussis (Tdap/Td) vaccine.** / Consult your health care provider. Pregnant women should receive 1 dose of Tdap vaccine during each pregnancy. 1 dose of Td every 10 years.  Varicella vaccine.** / Consult your health care provider. Pregnant females who do not have evidence of immunity should receive the first dose after pregnancy.  Zoster vaccine.** / 1 dose for adults aged 30 years or older.  Measles, mumps, rubella (MMR) vaccine.** / You need at least 1 dose of MMR if you were born in 1957 or later. You may also need a second dose. For females of childbearing age, rubella immunity should be determined. If there is no evidence of immunity, females who are not pregnant should be vaccinated. If there is no evidence of immunity, females who are pregnant should delay immunization until after pregnancy.  Pneumococcal 13-valent conjugate (PCV13) vaccine.** / Consult your health care provider.  Pneumococcal polysaccharide (PPSV23) vaccine.** / 1 to 2 doses if you smoke cigarettes or if you have certain conditions.  Meningococcal vaccine.** /  Consult your health care provider.  Hepatitis A vaccine.** / Consult your health care provider.  Hepatitis B vaccine.** / Consult your health care provider.  Haemophilus influenzae type b (Hib) vaccine.** / Consult your health care provider. Ages 64 years and over  Blood pressure check.** / Every year.  Lipid and cholesterol check.** / Every 5 years beginning at age 23 years.  Lung cancer screening. / Every year if you  are aged 16-80 years and have a 30-pack-year history of smoking and currently smoke or have quit within the past 15 years. Yearly screening is stopped once you have quit smoking for at least 15 years or develop a health problem that would prevent you from having lung cancer treatment.  Clinical breast exam.** / Every year after age 74 years.  BRCA-related cancer risk assessment.** / For women who have family members with a BRCA-related cancer (breast, ovarian, tubal, or peritoneal cancers).  Mammogram.** / Every year beginning at age 44 years and continuing for as long as you are in good health. Consult with your health care provider.  Pap test.** / Every 3 years starting at age 58 years through age 22 or 39 years with 3 consecutive normal Pap tests. Testing can be stopped between 65 and 70 years with 3 consecutive normal Pap tests and no abnormal Pap or HPV tests in the past 10 years.  HPV screening.** / Every 3 years from ages 64 years through ages 70 or 61 years with a history of 3 consecutive normal Pap tests. Testing can be stopped between 65 and 70 years with 3 consecutive normal Pap tests and no abnormal Pap or HPV tests in the past 10 years.  Fecal occult blood test (FOBT) of stool. / Every year beginning at age 40 years and continuing until age 27 years. You may not need to do this test if you get a colonoscopy every 10 years.  Flexible sigmoidoscopy or colonoscopy.** / Every 5 years for a flexible sigmoidoscopy or every 10 years for a colonoscopy beginning at age 7 years and continuing until age 32 years.  Hepatitis C blood test.** / For all people born from [birth year] through 1966 and any individual with known risks for hepatitis C.  Osteoporosis screening.** / A one-time screening for women ages 30 years and over and women at risk for fractures or osteoporosis.  Skin self-exam. / Monthly.  Influenza vaccine. / Every year.  Tetanus, diphtheria, and acellular pertussis (Tdap/Td)  vaccine.** / 1 dose of Td every 10 years.  Varicella vaccine.** / Consult your health care provider.  Zoster vaccine.** / 1 dose for adults aged 35 years or older.  Pneumococcal 13-valent conjugate (PCV13) vaccine.** / Consult your health care provider.  Pneumococcal polysaccharide (PPSV23) vaccine.** / 1 dose for all adults aged 46 years and older.  Meningococcal vaccine.** / Consult your health care provider.  Hepatitis A vaccine.** / Consult your health care provider.  Hepatitis B vaccine.** / Consult your health care provider.  Haemophilus influenzae type b (Hib) vaccine.** / Consult your health care provider. ** Family history and personal history of risk and conditions may change your health care provider's recommendations.   This information is not intended to replace advice given to you by your health care provider. Make sure you discuss any questions you have with your health care provider.   Document Released: 10/10/2001 Document Revised: 09/04/2014 Document Reviewed: 01/09/2011 Elsevier Interactive Patient Education [employer].

## 2016-04-11 NOTE — Progress Notes (Signed)
Subjective:     Brandy Saunders is a 25 y.o. female and is here for a comprehensive physical exam. The patient reports problems - still c/o ear and throat pain.  Social History   Social History  . Marital status: Single    Spouse name: n/a  . Number of children: 0  . Years of education: 17   Occupational History  . sales time Forensic scientist    Social History Main Topics  . Smoking status: Former Smoker    Types: Cigarettes  . Smokeless tobacco: Never Used  . Alcohol use 1.8 oz/week    3 Glasses of wine per week  . Drug use:     Types: Marijuana     Comment: once every 6 months  . Sexual activity: Yes    Partners: Male    Birth control/ protection: Condom     Comment: inconsistent condom use   Other Topics Concern  . Not on file   Social History Narrative   Lives with a roommate.  Parents live in [place], [place].   Health Maintenance  Topic Date Due  . INFLUENZA VACCINE  05/12/2016 (Originally 03/28/2016)  . PAP SMEAR  09/24/2017  . TETANUS/TDAP  07/30/2018  . HIV Screening  Completed    The following portions of the patient's history were reviewed and updated as appropriate: She  has a past medical history of Asthma; Carpal tunnel syndrome, bilateral; Morbidly obese ([place]); and PCOS (polycystic ovarian syndrome). She  does not have any pertinent problems on file. She  has a past surgical history that includes Tonsilectomy, adenoidectomy, bilateral myringotomy and tubes (1994). Her family history includes Diabetes in her maternal uncle; Heart disease in her maternal grandmother; Hyperlipidemia in her mother; Hypertension in her mother; Stroke in her father. She  reports that she has quit smoking. Her smoking use included Cigarettes. She has never used smokeless tobacco. She reports that she drinks about 1.8 oz of alcohol per week . She reports that she uses drugs, including Marijuana. She has a current medication list which includes the following prescription(s): biotin,  cefuroxime, cholecalciferol, b-12, fluticasone, multiple vitamins-calcium, norethindrone-ethinyl estradiol, and levocetirizine. Current Outpatient Prescriptions on File Prior to Visit  Medication Sig Dispense Refill  . BIOTIN 5000 PO Take 10,000 mg by mouth.    . cefUROXime (CEFTIN) 500 MG tablet Take 1 tablet (500 mg total) by mouth 2 (two) times daily with a meal. 20 tablet 0  . cholecalciferol (VITAMIN D) 1000 units tablet Take 1,000 Units by mouth daily.    . Cyanocobalamin (B-12) 1000 MCG CAPS Take by mouth.    . fluticasone (FLONASE) 50 MCG/ACT nasal spray Place 2 sprays into both nostrils daily. 16 g 6  . Multiple Vitamins-Calcium (ONE-A-DAY WOMENS PO) Take by mouth.    . norethindrone-ethinyl estradiol (JUNEL FE 1/20) 1-20 MG-MCG tablet Take 1 tablet by mouth daily. 3 Package 4  . levocetirizine (XYZAL) 5 MG tablet Take 1 tablet (5 mg total) by mouth every evening. (Patient not taking: Reported on 04/11/2016) 30 tablet 5   No current facility-administered medications on file prior to visit.    She is allergic to metformin and related..  Review of Systems Review of Systems  Constitutional: Negative for activity change, appetite change and fatigue.  HENT: Negative for hearing loss, congestion, tinnitus and ear discharge.  dentist q40m Eyes: Negative for visual disturbance (see optho q1y -- vision corrected to 20/20 with glasses).  Respiratory: Negative for cough, chest tightness and shortness of breath.  Cardiovascular: Negative for chest pain, palpitations and leg swelling.  Gastrointestinal: Negative for abdominal pain, diarrhea, constipation and abdominal distention.  Genitourinary: Negative for urgency, frequency, decreased urine volume and difficulty urinating.  Musculoskeletal: Negative for back pain, arthralgias and gait problem.  Skin: Negative for color change, pallor and rash.  Neurological: Negative for dizziness, light-headedness, numbness and headaches.  Hematological:  Negative for adenopathy. Does not bruise/bleed easily.  Psychiatric/Behavioral: Negative for suicidal ideas, confusion, sleep disturbance, self-injury, dysphoric mood, decreased concentration and agitation.       Objective:    BP 126/76 (BP Location: Right Arm, Patient Position: Sitting, Cuff Size: Large)   Pulse 82   Temp 98 F (36.7 C) (Oral)   Wt (!) 402 lb (182.3 kg)   LMP 03/21/2016   SpO2 99%   BMI 66.90 kg/m  General appearance: alert, cooperative, appears stated age and no distress Head: Normocephalic, without obvious abnormality, atraumatic Eyes: conjunctivae/corneas clear. PERRL, EOM's intact. Fundi benign. Ears: normal TM's and external ear canals both ears Nose: Nares normal. Septum midline. Mucosa normal. No drainage or sinus tenderness. Throat: lips, mucosa, and tongue normal; teeth and gums normal Neck: no adenopathy, no carotid bruit, no JVD, supple, symmetrical, trachea midline and thyroid not enlarged, symmetric, no tenderness/mass/nodules Back: symmetric, no curvature. ROM normal. No CVA tenderness. Lungs: clear to auscultation bilaterally Breasts: normal appearance, no masses or tenderness Heart: regular rate and rhythm, S1, S2 normal, no murmur, click, rub or gallop Abdomen: abnormal findings:  obese Pelvic: cervix normal in appearance, external genitalia normal, no adnexal masses or tenderness, no bladder tenderness, no cervical motion tenderness, rectovaginal septum normal, uterus normal size, shape, and consistency, vagina normal without discharge and pap done Extremities: extremities normal, atraumatic, no cyanosis or edema Pulses: 2+ and symmetric Skin: Skin color, texture, turgor normal. No rashes or lesions Lymph nodes: Cervical, supraclavicular, and axillary nodes normal. Neurologic: Alert and oriented X 3, normal strength and tone. Normal symmetric reflexes. Normal coordination and gait    Assessment:    Healthy female exam.      Plan:    ghm  utd  Check labs See After Visit Summary for Counseling Recommendations    1. Preventative health care See above - HIV antibody - CBC with Differential/Platelet - Lipid panel - POCT urinalysis dipstick - TSH - Comprehensive metabolic panel - HSV 2 antibody, IgG - RPR - Cytology - PAP  2. Screening for malignant neoplasm of cervix  - Cytology - PAP  3. STD exposure

## 2016-04-11 NOTE — Progress Notes (Signed)
Pre visit review using our clinic review tool, if applicable. No additional management support is needed unless otherwise documented below in the visit note. 

## 2016-04-12 LAB — CBC WITH DIFFERENTIAL/PLATELET
BASOS PCT: 0.3 % (ref 0.0–3.0)
Basophils Absolute: 0 10*3/uL (ref 0.0–0.1)
EOS PCT: 1.3 % (ref 0.0–5.0)
Eosinophils Absolute: 0.1 10*3/uL (ref 0.0–0.7)
HCT: 37.5 % (ref 36.0–46.0)
HEMOGLOBIN: 12.6 g/dL (ref 12.0–15.0)
Lymphocytes Relative: 42.5 % (ref 12.0–46.0)
Lymphs Abs: 2.5 10*3/uL (ref 0.7–4.0)
MCHC: 33.7 g/dL (ref 30.0–36.0)
MCV: 80.7 fl (ref 78.0–100.0)
MONO ABS: 0.2 10*3/uL (ref 0.1–1.0)
MONOS PCT: 3.3 % (ref 3.0–12.0)
Neutro Abs: 3.1 10*3/uL (ref 1.4–7.7)
Neutrophils Relative %: 52.6 % (ref 43.0–77.0)
Platelets: 278 10*3/uL (ref 150.0–400.0)
RBC: 4.64 Mil/uL (ref 3.87–5.11)
RDW: 14.4 % (ref 11.5–15.5)
WBC: 5.8 10*3/uL (ref 4.0–10.5)

## 2016-04-12 LAB — COMPREHENSIVE METABOLIC PANEL
ALK PHOS: 41 U/L (ref 39–117)
ALT: 15 U/L (ref 0–35)
AST: 15 U/L (ref 0–37)
Albumin: 4.1 g/dL (ref 3.5–5.2)
BUN: 19 mg/dL (ref 6–23)
CO2: 26 mEq/L (ref 19–32)
Calcium: 9.4 mg/dL (ref 8.4–10.5)
Chloride: 103 mEq/L (ref 96–112)
Creatinine, Ser: 0.72 mg/dL (ref 0.40–1.20)
GFR: 125.89 mL/min (ref >60.00)
GLUCOSE: 89 mg/dL (ref 70–99)
POTASSIUM: 4.5 meq/L (ref 3.5–5.1)
SODIUM: 136 meq/L (ref 135–145)
TOTAL PROTEIN: 7.3 g/dL (ref 6.0–8.3)
Total Bilirubin: 0.3 mg/dL (ref 0.2–1.2)

## 2016-04-12 LAB — RPR

## 2016-04-12 LAB — LIPID PANEL
CHOL/HDL RATIO: 3
CHOLESTEROL: 172 mg/dL (ref 0–200)
HDL: 60.2 mg/dL (ref >39.00)
LDL CALC: 94 mg/dL (ref 0–99)
NONHDL: 112.09
Triglycerides: 90 mg/dL (ref 0.0–149.0)
VLDL: 18 mg/dL (ref 0.0–40.0)

## 2016-04-12 LAB — HIV ANTIBODY (ROUTINE TESTING W REFLEX): HIV 1&2 Ab, 4th Generation: NONREACTIVE

## 2016-04-12 LAB — HSV 2 ANTIBODY, IGG: HSV 2 Glycoprotein G Ab, IgG: <0.9 Index (ref <0.90)

## 2016-04-12 LAB — TSH: TSH: 1.49 u[IU]/mL (ref 0.35–4.50)

## 2016-04-17 LAB — CYTOLOGY - PAP

## 2016-04-17 LAB — CERVICOVAGINAL ANCILLARY ONLY
BACTERIAL VAGINITIS: POSITIVE — AB
CANDIDA VAGINITIS: NEGATIVE

## 2016-04-18 ENCOUNTER — Other Ambulatory Visit: Payer: Self-pay

## 2016-04-18 MED ORDER — METRONIDAZOLE 500 MG PO TABS: 500.0000 mg | ORAL_TABLET | Freq: Two times a day (BID) | ORAL | 0 refills | Status: DC

## 2016-04-18 MED ORDER — FLUCONAZOLE 150 MG PO TABS: 150.0000 mg | ORAL_TABLET | Freq: Once | ORAL | 0 refills | Status: AC

## 2016-04-18 NOTE — Progress Notes (Signed)
[id number];ygy

## 2017-01-01 ENCOUNTER — Encounter: Payer: Self-pay | Admitting: Family Medicine

## 2017-01-01 ENCOUNTER — Ambulatory Visit (INDEPENDENT_AMBULATORY_CARE_PROVIDER_SITE_OTHER): Payer: Managed Care, Other (non HMO) | Admitting: Family Medicine

## 2017-01-01 ENCOUNTER — Other Ambulatory Visit (HOSPITAL_COMMUNITY)
Admission: RE | Admit: 2017-01-01 | Discharge: 2017-01-01 | Disposition: A | Discharge status: Home / Self Care | Payer: Managed Care, Other (non HMO) | Source: Ambulatory Visit | Attending: Family Medicine | Admitting: Family Medicine

## 2017-01-01 VITALS — BP 133/86 | HR 104 | Temp 98.3°F | Resp 16 | Ht 65.0 in | Wt >= 6400 oz

## 2017-01-01 DIAGNOSIS — R300 Dysuria: Secondary | ICD-10-CM | POA: Diagnosis not present

## 2017-01-01 DIAGNOSIS — I10 Essential (primary) hypertension: Secondary | ICD-10-CM

## 2017-01-01 DIAGNOSIS — N760 Acute vaginitis: Secondary | ICD-10-CM | POA: Insufficient documentation

## 2017-01-01 DIAGNOSIS — R350 Frequency of micturition: Secondary | ICD-10-CM

## 2017-01-01 DIAGNOSIS — B9689 Other specified bacterial agents as the cause of diseases classified elsewhere: Secondary | ICD-10-CM | POA: Diagnosis not present

## 2017-01-01 DIAGNOSIS — G43809 Other migraine, not intractable, without status migrainosus: Secondary | ICD-10-CM

## 2017-01-01 LAB — POC URINALSYSI DIPSTICK (AUTOMATED)
Bilirubin, UA: NEGATIVE
GLUCOSE UA: NEGATIVE
Ketones, UA: NEGATIVE
Nitrite, UA: NEGATIVE
Spec Grav, UA: 1.025 (ref 1.010–1.025)
UROBILINOGEN UA: 0.2 U/dL
pH, UA: 6 (ref 5.0–8.0)

## 2017-01-01 MED ORDER — METOPROLOL SUCCINATE ER 50 MG PO TB24: 50.0000 mg | ORAL_TABLET | Freq: Every day | ORAL | 3 refills | Status: DC

## 2017-01-01 MED ORDER — CIPROFLOXACIN HCL 250 MG PO TABS: ORAL_TABLET | ORAL | 0 refills | Status: DC

## 2017-01-01 NOTE — Progress Notes (Addendum)
Patient ID: Brandy Saunders, female   DOB: [birth year], 26 y.o.   MRN: [id number]     Subjective:    Patient ID: Brandy Saunders, female    DOB: 08/09/1991, 26 y.o.   MRN: [id number]  Chief Complaint  Patient presents with  . Dysuria  . Urinary Frequency    HPI Patient is in today for UTI symptoms.  She has had dysuria and urinary frequency since last Monday.  She has been having migraines for years-- they have just become more intensity and frequency.   Pt also c/o more frequent migraines -- she denies any congestion -- headaches are becoming more often -- weekly to every few days and last all day.  + blurry vision-- and has not see eye dr.  +photo/phono phobia ------pt taking otc migraine relief with little relief Past Medical History:  Diagnosis Date  . Asthma   . Carpal tunnel syndrome, bilateral   . Morbidly obese ([place])   . PCOS (polycystic ovarian syndrome)     Past Surgical History:  Procedure Laterality Date  . TONSILECTOMY, ADENOIDECTOMY, BILATERAL MYRINGOTOMY AND TUBES  1994    Family History  Problem Relation Age of Onset  . Hypertension Mother   . Hyperlipidemia Mother   . Stroke Father   . Diabetes Maternal Uncle   . Heart disease Maternal Grandmother   . Colon cancer Neg Hx     Social History   Social History  . Marital status: Single    Spouse name: n/a  . Number of children: 0  . Years of education: 58   Occupational History  . sales time Forensic scientist    Social History Main Topics  . Smoking status: Former Smoker    Types: Cigarettes  . Smokeless tobacco: Never Used  . Alcohol use 1.8 oz/week    3 Glasses of wine per week  . Drug use: Yes    Types: Marijuana     Comment: once every 6 months  . Sexual activity: Yes    Partners: Male    Birth control/ protection: Condom     Comment: inconsistent condom use   Other Topics Concern  . Not on file   Social History Narrative   Lives with a roommate.  Parents live in [place], [place].     Outpatient Medications Prior to Visit  Medication Sig Dispense Refill  . BIOTIN 5000 PO Take 10,000 mg by mouth.    . cholecalciferol (VITAMIN D) 1000 units tablet Take 1,000 Units by mouth daily.    . Cyanocobalamin (B-12) 1000 MCG CAPS Take by mouth.    . fluticasone (FLONASE) 50 MCG/ACT nasal spray Place 2 sprays into both nostrils daily. 16 g 6  . levocetirizine (XYZAL) 5 MG tablet Take 1 tablet (5 mg total) by mouth every evening. 30 tablet 5  . Multiple Vitamins-Calcium (ONE-A-DAY WOMENS PO) Take by mouth.    . norethindrone-ethinyl estradiol (JUNEL FE 1/20) 1-20 MG-MCG tablet Take 1 tablet by mouth daily. 3 Package 4  . cefUROXime (CEFTIN) 500 MG tablet Take 1 tablet (500 mg total) by mouth 2 (two) times daily with a meal. 20 tablet 0  . metroNIDAZOLE (FLAGYL) 500 MG tablet Take 1 tablet (500 mg total) by mouth 2 (two) times daily. 14 tablet 0   No facility-administered medications prior to visit.     Allergies  Allergen Reactions  . Metformin And Related Diarrhea    Review of Systems  Constitutional: Negative for fever and malaise/fatigue.  HENT: Negative for congestion.  Eyes: Negative for blurred vision.  Respiratory: Negative for cough and shortness of breath.   Cardiovascular: Negative for chest pain, palpitations and leg swelling.  Gastrointestinal: Negative for vomiting.  Genitourinary: Positive for dysuria, frequency and urgency.  Musculoskeletal: Negative for back pain.  Skin: Negative for rash.  Neurological: Negative for loss of consciousness and headaches.       Objective:    Physical Exam  Constitutional: She is oriented to person, place, and time. She appears well-developed and well-nourished. No distress.  HENT:  Head: Normocephalic and atraumatic.  Eyes: Conjunctivae are normal.  Neck: Normal range of motion. No thyromegaly present.  Cardiovascular: Normal rate and regular rhythm.   Pulmonary/Chest: Effort normal and breath sounds normal. She  has no wheezes.  Abdominal: Soft. Bowel sounds are normal. There is no tenderness.  Musculoskeletal: Normal range of motion. She exhibits no edema or deformity.  Neurological: She is alert and oriented to person, place, and time.  Skin: Skin is warm and dry. She is not diaphoretic.  Psychiatric: She has a normal mood and affect.    BP 133/86 (BP Location: Left Wrist, Cuff Size: Normal)   Pulse (!) 104   Temp 98.3 F (36.8 C) (Oral)   Resp 16   Ht 5\' 5"  (1.651 m)   Wt (!) 437 lb (198.2 kg)   LMP 12/18/2016   SpO2 98%   BMI 72.72 kg/m  Wt Readings from Last 3 Encounters:  01/01/17 (!) 437 lb (198.2 kg)  04/11/16 (!) 402 lb (182.3 kg)  03/23/16 (!) 391 lb 6.4 oz (177.5 kg)     Lab Results  Component Value Date   WBC 5.8 04/11/2016   HGB 12.6 04/11/2016   HCT 37.5 04/11/2016   PLT 278.0 04/11/2016   GLUCOSE 89 04/11/2016   CHOL 172 04/11/2016   TRIG 90.0 04/11/2016   HDL 60.20 04/11/2016   LDLCALC 94 04/11/2016   ALT 15 04/11/2016   AST 15 04/11/2016   NA 136 04/11/2016   K 4.5 04/11/2016   CL 103 04/11/2016   CREATININE 0.72 04/11/2016   BUN 19 04/11/2016   CO2 26 04/11/2016   TSH 1.49 04/11/2016   HGBA1C 5.8 01/07/2016    Lab Results  Component Value Date   TSH 1.49 04/11/2016   Lab Results  Component Value Date   WBC 5.8 04/11/2016   HGB 12.6 04/11/2016   HCT 37.5 04/11/2016   MCV 80.7 04/11/2016   PLT 278.0 04/11/2016   Lab Results  Component Value Date   NA 136 04/11/2016   K 4.5 04/11/2016   CO2 26 04/11/2016   GLUCOSE 89 04/11/2016   BUN 19 04/11/2016   CREATININE 0.72 04/11/2016   BILITOT 0.3 04/11/2016   ALKPHOS 41 04/11/2016   AST 15 04/11/2016   ALT 15 04/11/2016   PROT 7.3 04/11/2016   ALBUMIN 4.1 04/11/2016   CALCIUM 9.4 04/11/2016   GFR 125.89 04/11/2016   Lab Results  Component Value Date   CHOL 172 04/11/2016   Lab Results  Component Value Date   HDL 60.20 04/11/2016   Lab Results  Component Value Date   LDLCALC 94  04/11/2016   Lab Results  Component Value Date   TRIG 90.0 04/11/2016   Lab Results  Component Value Date   CHOLHDL 3 04/11/2016   Lab Results  Component Value Date   HGBA1C 5.8 01/07/2016       Assessment & Plan:   Problem List Items Addressed This Visit  None    Visit Diagnoses    Frequency of urination    -  Primary   Relevant Medications   ciprofloxacin (CIPRO) 250 MG tablet   Other Relevant Orders   POCT Urinalysis Dipstick (Automated) (Completed)   Urine culture   Urine cytology ancillary only   Dysuria       Relevant Medications   ciprofloxacin (CIPRO) 250 MG tablet   Other Relevant Orders   POCT Urinalysis Dipstick (Automated) (Completed)   Urine culture   Urine cytology ancillary only   Other migraine without status migrainosus, not intractable       Relevant Medications   metoprolol succinate (TOPROL-XL) 50 MG 24 hr tablet   Other Relevant Orders   MR Brain Wo Contrast   Essential hypertension       Relevant Medications   metoprolol succinate (TOPROL-XL) 50 MG 24 hr tablet      I have discontinued Ms. Campbell's cefUROXime and metroNIDAZOLE. I am also having her start on ciprofloxacin and metoprolol succinate. Additionally, I am having her maintain her norethindrone-ethinyl estradiol, Multiple Vitamins-Calcium (ONE-A-DAY WOMENS PO), BIOTIN 5000 PO, B-12, cholecalciferol, levocetirizine, fluticasone, and vitamin C.  Meds ordered this encounter  Medications  . Ascorbic Acid (VITAMIN C) 1000 MG tablet    Sig: Take 1,000 mg by mouth daily.  . ciprofloxacin (CIPRO) 250 MG tablet    Sig: 1 po bid x 2    Dispense:  10 tablet    Refill:  0  . metoprolol succinate (TOPROL-XL) 50 MG 24 hr tablet    Sig: Take 1 tablet (50 mg total) by mouth daily. Take with or immediately following a meal.    Dispense:  30 tablet    Refill:  3     Ann Held, DO

## 2017-01-01 NOTE — Progress Notes (Signed)
Pre visit review using our clinic review tool, if applicable. No additional management support is needed unless otherwise documented below in the visit note. 

## 2017-01-01 NOTE — Patient Instructions (Signed)

## 2017-01-02 LAB — URINE CYTOLOGY ANCILLARY ONLY
CHLAMYDIA, DNA PROBE: NEGATIVE
Neisseria Gonorrhea: NEGATIVE
Trichomonas: NEGATIVE

## 2017-01-04 LAB — URINE CULTURE

## 2017-01-05 LAB — URINE CYTOLOGY ANCILLARY ONLY
Bacterial vaginitis: POSITIVE — AB
CANDIDA VAGINITIS: NEGATIVE

## 2017-01-07 ENCOUNTER — Other Ambulatory Visit: Payer: Self-pay | Admitting: Family Medicine

## 2017-01-07 DIAGNOSIS — B9689 Other specified bacterial agents as the cause of diseases classified elsewhere: Secondary | ICD-10-CM

## 2017-01-07 DIAGNOSIS — N760 Acute vaginitis: Principal | ICD-10-CM

## 2017-01-07 MED ORDER — METRONIDAZOLE 500 MG PO TABS: 500.0000 mg | ORAL_TABLET | Freq: Two times a day (BID) | ORAL | 0 refills | Status: DC

## 2017-01-09 ENCOUNTER — Telehealth: Payer: Self-pay | Admitting: *Deleted

## 2017-01-09 ENCOUNTER — Encounter: Payer: Self-pay | Admitting: Family Medicine

## 2017-01-09 ENCOUNTER — Ambulatory Visit (INDEPENDENT_AMBULATORY_CARE_PROVIDER_SITE_OTHER): Payer: Managed Care, Other (non HMO) | Admitting: Family Medicine

## 2017-01-09 VITALS — BP 126/82 | HR 98 | Temp 98.8°F | Resp 16 | Ht 65.0 in | Wt >= 6400 oz

## 2017-01-09 DIAGNOSIS — G43909 Migraine, unspecified, not intractable, without status migrainosus: Secondary | ICD-10-CM

## 2017-01-09 DIAGNOSIS — I10 Essential (primary) hypertension: Secondary | ICD-10-CM

## 2017-01-09 DIAGNOSIS — N760 Acute vaginitis: Secondary | ICD-10-CM

## 2017-01-09 DIAGNOSIS — B9689 Other specified bacterial agents as the cause of diseases classified elsewhere: Secondary | ICD-10-CM | POA: Diagnosis not present

## 2017-01-09 NOTE — Telephone Encounter (Signed)
Let pt know her ins wont pay for mri-- we can refer to neuro if headache does not subside    ----- Message -----  From: Margot Ables  Sent: 01/03/2017  3:16 PM  To: Ann Held, DO, Robin B Ewing, CMA, *    MRI Brain w/o Contrast was denied by [employer] stating not medically necessary based on information provided. For peer-to-peer [phone], Nita Sells [place], ID# [id number], Ref Code: [id number].    Patient notified an referral placed for neuro.

## 2017-01-09 NOTE — Progress Notes (Addendum)
Patient ID: Brandy Saunders, female   DOB: 1991-01-29, 26 y.o.   MRN: [id number]     Subjective:  I acted as a Education administrator for Dr. Carollee Herter.  Brandy Saunders, [place]   Patient ID: Brandy Saunders, female    DOB: 07-22-1991, 26 y.o.   MRN: [id number]  Chief Complaint  Patient presents with  . Hypertension    follow up blood pressure  pt c/o congestion today-- started yesterday No fevers Headaches are worsening --- she was dx as a child and headaches are inc in intensity and frequency.          Patient is in today for follow up blood pressure.  Patient Care Team: Carollee Herter, Alferd Apa, DO as PCP - General (Family Medicine)   Past Medical History:  Diagnosis Date  . Asthma   . Carpal tunnel syndrome, bilateral   . Morbidly obese ([place])   . PCOS (polycystic ovarian syndrome)     Past Surgical History:  Procedure Laterality Date  . TONSILECTOMY, ADENOIDECTOMY, BILATERAL MYRINGOTOMY AND TUBES  1994    Family History  Problem Relation Age of Onset  . Hypertension Mother   . Hyperlipidemia Mother   . Stroke Father   . Diabetes Maternal Uncle   . Heart disease Maternal Grandmother   . Colon cancer Neg Hx     Social History   Social History  . Marital status: Single    Spouse name: n/a  . Number of children: 0  . Years of education: 64   Occupational History  . sales time Forensic scientist    Social History Main Topics  . Smoking status: Former Smoker    Types: Cigarettes  . Smokeless tobacco: Never Used  . Alcohol use 1.8 oz/week    3 Glasses of wine per week  . Drug use: Yes    Types: Marijuana     Comment: once every 6 months  . Sexual activity: Yes    Partners: Male    Birth control/ protection: Condom     Comment: inconsistent condom use   Other Topics Concern  . Not on file   Social History Narrative   Lives with a roommate.  Parents live in [place], [place].    Outpatient Medications Prior to Visit  Medication Sig Dispense Refill  . Ascorbic Acid (VITAMIN C)  1000 MG tablet Take 1,000 mg by mouth daily.    Marland Kitchen BIOTIN 5000 PO Take 10,000 mg by mouth.    . cholecalciferol (VITAMIN D) 1000 units tablet Take 1,000 Units by mouth daily.    . Cyanocobalamin (B-12) 1000 MCG CAPS Take by mouth.    . fluticasone (FLONASE) 50 MCG/ACT nasal spray Place 2 sprays into both nostrils daily. 16 g 6  . levocetirizine (XYZAL) 5 MG tablet Take 1 tablet (5 mg total) by mouth every evening. 30 tablet 5  . metoprolol succinate (TOPROL-XL) 50 MG 24 hr tablet Take 1 tablet (50 mg total) by mouth daily. Take with or immediately following a meal. 30 tablet 3  . metroNIDAZOLE (FLAGYL) 500 MG tablet Take 1 tablet (500 mg total) by mouth 2 (two) times daily. 14 tablet 0  . Multiple Vitamins-Calcium (ONE-A-DAY WOMENS PO) Take by mouth.    . norethindrone-ethinyl estradiol (JUNEL FE 1/20) 1-20 MG-MCG tablet Take 1 tablet by mouth daily. 3 Package 4  . ciprofloxacin (CIPRO) 250 MG tablet 1 po bid x 2 10 tablet 0   No facility-administered medications prior to visit.     Allergies  Allergen Reactions  .  Metformin And Related Diarrhea    Review of Systems  Constitutional: Negative for fever and malaise/fatigue.  HENT: Positive for congestion, sinus pain and sore throat.   Eyes: Negative for blurred vision.  Respiratory: Positive for cough. Negative for shortness of breath.   Cardiovascular: Negative for chest pain, palpitations and leg swelling.  Gastrointestinal: Negative for vomiting.  Musculoskeletal: Negative for back pain.  Skin: Negative for rash.  Neurological: Positive for headaches. Negative for loss of consciousness.       Objective:    Physical Exam  Constitutional: She is oriented to person, place, and time. She appears well-developed and well-nourished. No distress.  HENT:  Head: Normocephalic and atraumatic.  Eyes: Conjunctivae are normal.  Neck: Normal range of motion. No thyromegaly present.  Cardiovascular: Normal rate and regular rhythm.     Pulmonary/Chest: Effort normal and breath sounds normal. She has no wheezes.  Abdominal: Soft. Bowel sounds are normal. There is no tenderness.  Musculoskeletal: Normal range of motion. She exhibits no edema or deformity.  Neurological: She is alert and oriented to person, place, and time.  Skin: Skin is warm and dry. She is not diaphoretic.  Psychiatric: She has a normal mood and affect.  Nursing note and vitals reviewed.   BP 126/82 (BP Location: Left Wrist, Cuff Size: Normal)   Pulse 98   Temp 98.8 F (37.1 C) (Oral)   Resp 16   Ht 5\' 5"  (1.651 m)   Wt (!) 439 lb 3.2 oz (199.2 kg)   LMP 12/18/2016   SpO2 98%   BMI 73.09 kg/m  Wt Readings from Last 3 Encounters:  01/09/17 (!) 439 lb 3.2 oz (199.2 kg)  01/01/17 (!) 437 lb (198.2 kg)  04/11/16 (!) 402 lb (182.3 kg)   BP Readings from Last 3 Encounters:  01/09/17 126/82  01/01/17 133/86  04/11/16 126/76     Immunization History  Administered Date(s) Administered  . Td 07/30/2008    Health Maintenance  Topic Date Due  . INFLUENZA VACCINE  03/28/2017  . TETANUS/TDAP  07/30/2018  . PAP SMEAR  04/12/2019  . HIV Screening  Completed    Lab Results  Component Value Date   WBC 5.8 04/11/2016   HGB 12.6 04/11/2016   HCT 37.5 04/11/2016   PLT 278.0 04/11/2016   GLUCOSE 89 04/11/2016   CHOL 172 04/11/2016   TRIG 90.0 04/11/2016   HDL 60.20 04/11/2016   LDLCALC 94 04/11/2016   ALT 15 04/11/2016   AST 15 04/11/2016   NA 136 04/11/2016   K 4.5 04/11/2016   CL 103 04/11/2016   CREATININE 0.72 04/11/2016   BUN 19 04/11/2016   CO2 26 04/11/2016   TSH 1.49 04/11/2016   HGBA1C 5.8 01/07/2016    Lab Results  Component Value Date   TSH 1.49 04/11/2016   Lab Results  Component Value Date   WBC 5.8 04/11/2016   HGB 12.6 04/11/2016   HCT 37.5 04/11/2016   MCV 80.7 04/11/2016   PLT 278.0 04/11/2016   Lab Results  Component Value Date   NA 136 04/11/2016   K 4.5 04/11/2016   CO2 26 04/11/2016   GLUCOSE 89  04/11/2016   BUN 19 04/11/2016   CREATININE 0.72 04/11/2016   BILITOT 0.3 04/11/2016   ALKPHOS 41 04/11/2016   AST 15 04/11/2016   ALT 15 04/11/2016   PROT 7.3 04/11/2016   ALBUMIN 4.1 04/11/2016   CALCIUM 9.4 04/11/2016   GFR 125.89 04/11/2016   Lab Results  Component  Value Date   CHOL 172 04/11/2016   Lab Results  Component Value Date   HDL 60.20 04/11/2016   Lab Results  Component Value Date   LDLCALC 94 04/11/2016   Lab Results  Component Value Date   TRIG 90.0 04/11/2016   Lab Results  Component Value Date   CHOLHDL 3 04/11/2016   Lab Results  Component Value Date   HGBA1C 5.8 01/07/2016         Assessment & Plan:   Problem List Items Addressed This Visit    None    Visit Diagnoses    Essential hypertension    -  Primary   BV (bacterial vaginosis)       Relevant Orders   Urine cytology ancillary only      I have discontinued Ms. Campbell's ciprofloxacin. I am also having her maintain her norethindrone-ethinyl estradiol, Multiple Vitamins-Calcium (ONE-A-DAY WOMENS PO), BIOTIN 5000 PO, B-12, cholecalciferol, levocetirizine, fluticasone, vitamin C, metoprolol succinate, and metroNIDAZOLE.  No orders of the defined types were placed in this encounter.   CMA served as Education administrator during this visit. History, Physical and Plan performed by medical provider. Documentation and orders reviewed and attested to.  Ann Held, DO

## 2017-01-09 NOTE — Patient Instructions (Signed)
DASH Eating Plan DASH stands for "Dietary Approaches to Stop Hypertension." The DASH eating plan is a healthy eating plan that has been shown to reduce high blood pressure (hypertension). It may also reduce your risk for type 2 diabetes, heart disease, and stroke. The DASH eating plan may also help with weight loss. What are tips for following this plan? General guidelines  Avoid eating more than 2,300 mg (milligrams) of salt (sodium) a day. If you have hypertension, you may need to reduce your sodium intake to 1,500 mg a day.  Limit alcohol intake to no more than 1 drink a day for nonpregnant women and 2 drinks a day for men. One drink equals 12 oz of beer, 5 oz of wine, or 1 oz of hard liquor.  Work with your health care provider to maintain a healthy body weight or to lose weight. Ask what an ideal weight is for you.  Get at least 30 minutes of exercise that causes your heart to beat faster (aerobic exercise) most days of the week. Activities may include walking, swimming, or biking.  Work with your health care provider or diet and nutrition specialist (dietitian) to adjust your eating plan to your individual calorie needs. Reading food labels  Check food labels for the amount of sodium per serving. Choose foods with less than 5 percent of the Daily Value of sodium. Generally, foods with less than 300 mg of sodium per serving fit into this eating plan.  To find whole grains, look for the word "whole" as the first word in the ingredient list. Shopping  Buy products labeled as "low-sodium" or "no salt added."  Buy fresh foods. Avoid canned foods and premade or frozen meals. Cooking  Avoid adding salt when cooking. Use salt-free seasonings or herbs instead of table salt or sea salt. Check with your health care provider or pharmacist before using salt substitutes.  Do not fry foods. Cook foods using healthy methods such as baking, boiling, grilling, and broiling instead.  Cook with  heart-healthy oils, such as olive, canola, soybean, or sunflower oil. Meal planning   Eat a balanced diet that includes: ? 5 or more servings of fruits and vegetables each day. At each meal, try to fill half of your plate with fruits and vegetables. ? Up to 6-8 servings of whole grains each day. ? Less than 6 oz of lean meat, poultry, or fish each day. A 3-oz serving of meat is about the same size as a deck of cards. One egg equals 1 oz. ? 2 servings of low-fat dairy each day. ? A serving of nuts, seeds, or beans 5 times each week. ? Heart-healthy fats. Healthy fats called Omega-3 fatty acids are found in foods such as flaxseeds and coldwater fish, like sardines, salmon, and mackerel.  Limit how much you eat of the following: ? Canned or prepackaged foods. ? Food that is high in trans fat, such as fried foods. ? Food that is high in saturated fat, such as fatty meat. ? Sweets, desserts, sugary drinks, and other foods with added sugar. ? Full-fat dairy products.  Do not salt foods before eating.  Try to eat at least 2 vegetarian meals each week.  Eat more home-cooked food and less restaurant, buffet, and fast food.  When eating at a restaurant, ask that your food be prepared with less salt or no salt, if possible. What foods are recommended? The items listed may not be a complete list. Talk with your dietitian about what   dietary choices are best for you. Grains Whole-grain or whole-wheat bread. Whole-grain or whole-wheat pasta. Brown rice. Oatmeal. Quinoa. Bulgur. Whole-grain and low-sodium cereals. Pita bread. Low-fat, low-sodium crackers. Whole-wheat flour tortillas. Vegetables Fresh or frozen vegetables (raw, steamed, roasted, or grilled). Low-sodium or reduced-sodium tomato and vegetable juice. Low-sodium or reduced-sodium tomato sauce and tomato paste. Low-sodium or reduced-sodium canned vegetables. Fruits All fresh, dried, or frozen fruit. Canned fruit in natural juice (without  added sugar). Meat and other protein foods Skinless chicken or turkey. Ground chicken or turkey. Pork with fat trimmed off. Fish and seafood. Egg whites. Dried beans, peas, or lentils. Unsalted nuts, nut butters, and seeds. Unsalted canned beans. Lean cuts of beef with fat trimmed off. Low-sodium, lean deli meat. Dairy Low-fat (1%) or fat-free (skim) milk. Fat-free, low-fat, or reduced-fat cheeses. Nonfat, low-sodium ricotta or cottage cheese. Low-fat or nonfat yogurt. Low-fat, low-sodium cheese. Fats and oils Soft margarine without trans fats. Vegetable oil. Low-fat, reduced-fat, or light mayonnaise and salad dressings (reduced-sodium). Canola, safflower, olive, soybean, and sunflower oils. Avocado. Seasoning and other foods Herbs. Spices. Seasoning mixes without salt. Unsalted popcorn and pretzels. Fat-free sweets. What foods are not recommended? The items listed may not be a complete list. Talk with your dietitian about what dietary choices are best for you. Grains Baked goods made with fat, such as croissants, muffins, or some breads. Dry pasta or rice meal packs. Vegetables Creamed or fried vegetables. Vegetables in a cheese sauce. Regular canned vegetables (not low-sodium or reduced-sodium). Regular canned tomato sauce and paste (not low-sodium or reduced-sodium). Regular tomato and vegetable juice (not low-sodium or reduced-sodium). Pickles. Olives. Fruits Canned fruit in a light or heavy syrup. Fried fruit. Fruit in cream or butter sauce. Meat and other protein foods Fatty cuts of meat. Ribs. Fried meat. Bacon. Sausage. Bologna and other processed lunch meats. Salami. Fatback. Hotdogs. Bratwurst. Salted nuts and seeds. Canned beans with added salt. Canned or smoked fish. Whole eggs or egg yolks. Chicken or turkey with skin. Dairy Whole or 2% milk, cream, and half-and-half. Whole or full-fat cream cheese. Whole-fat or sweetened yogurt. Full-fat cheese. Nondairy creamers. Whipped toppings.  Processed cheese and cheese spreads. Fats and oils Butter. Stick margarine. Lard. Shortening. Ghee. Bacon fat. Tropical oils, such as coconut, palm kernel, or palm oil. Seasoning and other foods Salted popcorn and pretzels. Onion salt, garlic salt, seasoned salt, table salt, and sea salt. Worcestershire sauce. Tartar sauce. Barbecue sauce. Teriyaki sauce. Soy sauce, including reduced-sodium. Steak sauce. Canned and packaged gravies. Fish sauce. Oyster sauce. Cocktail sauce. Horseradish that you find on the shelf. Ketchup. Mustard. Meat flavorings and tenderizers. Bouillon cubes. Hot sauce and Tabasco sauce. Premade or packaged marinades. Premade or packaged taco seasonings. Relishes. Regular salad dressings. Where to find more information:  National Heart, Lung, and Blood Institute: www.nhlbi.nih.gov  American Heart Association: www.heart.org Summary  The DASH eating plan is a healthy eating plan that has been shown to reduce high blood pressure (hypertension). It may also reduce your risk for type 2 diabetes, heart disease, and stroke.  With the DASH eating plan, you should limit salt (sodium) intake to 2,300 mg a day. If you have hypertension, you may need to reduce your sodium intake to 1,500 mg a day.  When on the DASH eating plan, aim to eat more fresh fruits and vegetables, whole grains, lean proteins, low-fat dairy, and heart-healthy fats.  Work with your health care provider or diet and nutrition specialist (dietitian) to adjust your eating plan to your individual   calorie needs. This information is not intended to replace advice given to you by your health care provider. Make sure you discuss any questions you have with your health care provider. Document Released: 08/03/2011 Document Revised: 08/07/2016 Document Reviewed: 08/07/2016 Elsevier Interactive Patient Education  2017 Elsevier Inc.  

## 2017-01-09 NOTE — Addendum Note (Signed)
Addended by: Roma Schanz R on: 01/09/2017 02:58 PM   Modules accepted: Orders

## 2017-01-15 ENCOUNTER — Encounter: Payer: Self-pay | Admitting: Family Medicine

## 2017-01-19 ENCOUNTER — Other Ambulatory Visit (INDEPENDENT_AMBULATORY_CARE_PROVIDER_SITE_OTHER): Payer: Managed Care, Other (non HMO)

## 2017-01-19 ENCOUNTER — Other Ambulatory Visit (HOSPITAL_COMMUNITY)
Admission: RE | Admit: 2017-01-19 | Discharge: 2017-01-19 | Disposition: A | Discharge status: Home / Self Care | Payer: Managed Care, Other (non HMO) | Source: Ambulatory Visit | Attending: Family Medicine | Admitting: Family Medicine

## 2017-01-19 DIAGNOSIS — N760 Acute vaginitis: Secondary | ICD-10-CM

## 2017-01-19 DIAGNOSIS — B9689 Other specified bacterial agents as the cause of diseases classified elsewhere: Secondary | ICD-10-CM

## 2017-01-24 LAB — URINE CYTOLOGY ANCILLARY ONLY: BACTERIAL VAGINITIS: POSITIVE — AB

## 2017-01-25 ENCOUNTER — Other Ambulatory Visit: Payer: Self-pay | Admitting: Family Medicine

## 2017-01-25 MED ORDER — METRONIDAZOLE 0.75 % VA GEL: VAGINAL | 3 refills | Status: DC

## 2017-01-25 MED ORDER — FLUCONAZOLE 150 MG PO TABS: ORAL_TABLET | ORAL | 0 refills | Status: DC

## 2017-02-02 ENCOUNTER — Encounter: Payer: Self-pay | Admitting: Neurology

## 2017-02-02 ENCOUNTER — Ambulatory Visit (INDEPENDENT_AMBULATORY_CARE_PROVIDER_SITE_OTHER): Payer: Managed Care, Other (non HMO) | Admitting: Neurology

## 2017-02-02 DIAGNOSIS — G43019 Migraine without aura, intractable, without status migrainosus: Secondary | ICD-10-CM

## 2017-02-02 HISTORY — DX: Migraine without aura, intractable, without status migrainosus: G43.019

## 2017-02-02 MED ORDER — SUMATRIPTAN SUCCINATE 100 MG PO TABS: 100.0000 mg | ORAL_TABLET | Freq: Two times a day (BID) | ORAL | 3 refills | Status: DC | PRN

## 2017-02-02 NOTE — Progress Notes (Signed)
Reason for visit: Migraine headache  Referring physician: Dr. Jerilee Hoh is a 26 y.o. female  History of present illness:  [name] is a 26 year old right-handed black female with a history of morbid obesity and migraine headaches. The patient has had migraine for about 10 years, the headaches are almost always on the right side of the head, very rarely she may have a left-sided headache. The patient indicates that the headaches usually occur during a "let down period", usually on a Friday or Saturday after a week of work. The headaches may last 24 hours. The patient indicates that she gets some sparkling lights in the vision at times, she may have some blurring of vision and she may have photophobia and phonophobia. She will have nausea without vomiting. The patient does have some neck stiffness and scalp soreness with the headache. The patient denies any family history of migraine, she does not know of any other activating factors for headache other than what was noted above. The patient does not take in much caffeine during the week, she may have occasional cups of coffee. The patient takes Tylenol or Excedrin Migraine for the headache with some benefit. The patient reports some more persistent numbness of the hands, she denies any numbness or weakness elsewhere. She has never been on any prescription medications for her migraine. She will have about 3-4 headaches a month. Because the headaches occur on the weekend, she is not missing work.  Past Medical History:  Diagnosis Date  . Asthma   . Carpal tunnel syndrome, bilateral   . Morbidly obese ([place])   . PCOS (polycystic ovarian syndrome)     Past Surgical History:  Procedure Laterality Date  . TONSILECTOMY, ADENOIDECTOMY, BILATERAL MYRINGOTOMY AND TUBES  1994    Family History  Problem Relation Age of Onset  . Hypertension Mother   . Hyperlipidemia Mother   . Stroke Father   . Diabetes Maternal Uncle   .  Heart disease Maternal Grandmother   . Colon cancer Neg Hx     Social history:  reports that she has quit smoking. Her smoking use included Cigarettes. She has never used smokeless tobacco. She reports that she drinks about 1.8 oz of alcohol per week . She reports that she uses drugs, including Marijuana.  Medications:  Prior to Admission medications   Medication Sig Start Date End Date Taking? Authorizing Provider  Ascorbic Acid (VITAMIN C) 1000 MG tablet Take 1,000 mg by mouth daily.   Yes [provider]  BIOTIN 5000 PO Take 10,000 mg by mouth.   Yes [provider]  cholecalciferol (VITAMIN D) 1000 units tablet Take 1,000 Units by mouth daily.   Yes [provider]  Cyanocobalamin (B-12) 1000 MCG CAPS Take by mouth.   Yes [provider]  fluconazole (DIFLUCAN) 150 MG tablet Take 1 tablet by mouth once, may repeat in 3 days. 01/25/17  Yes Roma Schanz R, DO  fluticasone (FLONASE) 50 MCG/ACT nasal spray Place 2 sprays into both nostrils daily. 03/23/16  Yes Ann Held, DO  levocetirizine (XYZAL) 5 MG tablet Take 1 tablet (5 mg total) by mouth every evening. 03/23/16  Yes Roma Schanz R, DO  metoprolol succinate (TOPROL-XL) 50 MG 24 hr tablet Take 1 tablet (50 mg total) by mouth daily. Take with or immediately following a meal. 01/01/17  Yes Roma Schanz R, DO  metroNIDAZOLE (METROGEL) 0.75 % vaginal gel 1 application for 5  nights, then 1 application twice weekly for 4 to 6 months 01/25/17  Yes Ann Held, DO  Multiple Vitamins-Calcium (ONE-A-DAY WOMENS PO) Take by mouth.   Yes [provider]  norethindrone-ethinyl estradiol (JUNEL FE 1/20) 1-20 MG-MCG tablet Take 1 tablet by mouth daily. 01/06/16  Yes Roma Schanz R, DO      Allergies  Allergen Reactions  . Metformin And Related Diarrhea    ROS:  Out of a complete 14 system review of symptoms, the patient complains only of the following symptoms,  and all other reviewed systems are negative.  Weight gain, fatigue Ringing in the ears Blurred vision Shortness of breath Diarrhea, constipation Urination problems Easy bruising Feeling hot, increased thirst Frequent infections Memory loss, confusion, headache, numbness, dizziness Anxiety, too much sleep, decreased energy, change in appetite, racing thoughts Insomnia, snoring  Blood pressure 133/88, pulse 82, height 5\' 6"  (1.676 m), weight (!) 438 lb (198.7 kg).  Physical Exam  General: The patient is alert and cooperative at the time of the examination. The patient is morbidly obese.  Eyes: Pupils are equal, round, and reactive to light. Discs are flat bilaterally. Venous pulsations are seen.  Neck: The neck is supple, no carotid bruits are noted.  Respiratory: The respiratory examination is clear.  Cardiovascular: The cardiovascular examination reveals a regular rate and rhythm, no obvious murmurs or rubs are noted.  Neuromuscular: Range of movement of the cervical spine is full, no crepitus is noted in the temporomandibular joints.  Skin: Extremities are with 2+ edema below the knees bilaterally.  Neurologic Exam  Mental status: The patient is alert and oriented x 3 at the time of the examination. The patient has apparent normal recent and remote memory, with an apparently normal attention span and concentration ability.  Cranial nerves: Facial symmetry is present. There is good sensation of the face to pinprick and soft touch bilaterally. The strength of the facial muscles and the muscles to head turning and shoulder shrug are normal bilaterally. Speech is well enunciated, no aphasia or dysarthria is noted. Extraocular movements are full. Visual fields are full. The tongue is midline, and the patient has symmetric elevation of the soft palate. No obvious hearing deficits are noted.  Motor: The motor testing reveals 5 over 5 strength of all 4 extremities. Good symmetric  motor tone is noted throughout.  Sensory: Sensory testing is intact to pinprick, soft touch, vibration sensation, and position sense on all 4 extremities. No evidence of extinction is noted.  Coordination: Cerebellar testing reveals good finger-nose-finger and heel-to-shin bilaterally.  Gait and station: Gait is normal. Tandem gait is normal. Romberg is negative. No drift is seen.  Reflexes: Deep tendon reflexes are symmetric, but are depressed bilaterally. Toes are downgoing bilaterally.   Assessment/Plan:  1. Morbid obesity  2. Migraine headache  3. Hand numbness, possible carpal tunnel syndrome  The patient is having 3-4 headaches a month, this is at the borderline of what would be considered for a daily prophylactic medication. We will start with taking Imitrex only for the headaches when they do occur. The headaches are not well controlled, we may consider a daily medication in the future. The patient will follow-up in 3 months.  Jill Alexanders MD 02/02/2017 8:32 AM  [facility] [address] [place], [place] [address]  Phone [phone] Fax [phone]

## 2017-02-22 ENCOUNTER — Emergency Department (HOSPITAL_COMMUNITY): Payer: Managed Care, Other (non HMO)

## 2017-02-22 ENCOUNTER — Encounter (HOSPITAL_COMMUNITY): Payer: Self-pay

## 2017-02-22 ENCOUNTER — Telehealth: Payer: Self-pay | Admitting: Family Medicine

## 2017-02-22 ENCOUNTER — Emergency Department (HOSPITAL_COMMUNITY)
Admission: EM | Admit: 2017-02-22 | Discharge: 2017-02-22 | Disposition: A | Discharge status: Home / Self Care | Payer: Managed Care, Other (non HMO) | Attending: Emergency Medicine | Admitting: Emergency Medicine

## 2017-02-22 DIAGNOSIS — J45909 Unspecified asthma, uncomplicated: Secondary | ICD-10-CM | POA: Diagnosis not present

## 2017-02-22 DIAGNOSIS — Z79899 Other long term (current) drug therapy: Secondary | ICD-10-CM | POA: Insufficient documentation

## 2017-02-22 DIAGNOSIS — R51 Headache: Secondary | ICD-10-CM | POA: Diagnosis present

## 2017-02-22 DIAGNOSIS — R202 Paresthesia of skin: Secondary | ICD-10-CM

## 2017-02-22 DIAGNOSIS — R519 Headache, unspecified: Secondary | ICD-10-CM

## 2017-02-22 DIAGNOSIS — Z87891 Personal history of nicotine dependence: Secondary | ICD-10-CM | POA: Diagnosis not present

## 2017-02-22 LAB — COMPREHENSIVE METABOLIC PANEL
ALT: 26 U/L (ref 14–54)
AST: 22 U/L (ref 15–41)
Albumin: 4.3 g/dL (ref 3.5–5.0)
Alkaline Phosphatase: 51 U/L (ref 38–126)
Anion gap: 9 (ref 5–15)
BILIRUBIN TOTAL: 0.4 mg/dL (ref 0.3–1.2)
BUN: 16 mg/dL (ref 6–20)
CO2: 23 mmol/L (ref 22–32)
Calcium: 9.4 mg/dL (ref 8.9–10.3)
Chloride: 105 mmol/L (ref 101–111)
Creatinine, Ser: 0.68 mg/dL (ref 0.44–1.00)
Glucose, Bld: 89 mg/dL (ref 65–99)
POTASSIUM: 4 mmol/L (ref 3.5–5.1)
Sodium: 137 mmol/L (ref 135–145)
TOTAL PROTEIN: 8.1 g/dL (ref 6.5–8.1)

## 2017-02-22 LAB — I-STAT BETA HCG BLOOD, ED (MC, WL, AP ONLY)

## 2017-02-22 MED ORDER — PROMETHAZINE HCL 25 MG/ML IJ SOLN
25.0000 mg | Freq: Once | INTRAMUSCULAR | Status: DC
  Filled 2017-02-22: qty 1

## 2017-02-22 MED ORDER — DIPHENHYDRAMINE HCL 50 MG/ML IJ SOLN
25.0000 mg | Freq: Once | INTRAMUSCULAR | Status: DC
  Filled 2017-02-22: qty 1

## 2017-02-22 MED ORDER — METOCLOPRAMIDE HCL 5 MG/ML IJ SOLN
10.0000 mg | Freq: Once | INTRAMUSCULAR | Status: DC
  Filled 2017-02-22: qty 2

## 2017-02-22 MED ORDER — KETOROLAC TROMETHAMINE 60 MG/2ML IM SOLN
60.0000 mg | Freq: Once | INTRAMUSCULAR | Status: AC
  Administered 2017-02-22: 60 mg via INTRAMUSCULAR
  Filled 2017-02-22: qty 2

## 2017-02-22 NOTE — ED Provider Notes (Signed)
[facility] DEPT Provider Note   CSN: [id number] Arrival date & time: 02/22/17  1545     History   Chief Complaint Chief Complaint  Patient presents with  . Headache  . Numbness    HPI Brandy Saunders is a 26 y.o. female.  The history is provided by the patient. No language interpreter was used.  Headache      Brandy Saunders is a 26 y.o. female who presents to the Emergency Department complaining of HA.  She has a history of migraine headaches reports 2 weeks of dull headache. 2 days ago she developed headache that is typical for her migraines with a right-sided periorbital headache. Today her headache is worse and spread to the left side as well. Her headache is worse with movement and activity. She also endorses bilateral lower extremity weakness with shortness of breath and malaise today. She has intermittent blurred vision, none currently. She also endorses tingling to her right second and third digit for the last week. No fevers, vomiting, abdominal pain. She has been trying sumatriptan at home with no improvement in her symptoms.  Past Medical History:  Diagnosis Date  . Asthma   . Carpal tunnel syndrome, bilateral   . Common migraine with intractable migraine 02/02/2017  . Morbidly obese ([place])   . PCOS (polycystic ovarian syndrome)     Patient Active Problem List   Diagnosis Date Noted  . Common migraine with intractable migraine 02/02/2017  . Chronic fatigue 01/06/2016  . Acute upper respiratory infection 07/14/2015  . Fatty liver 02/15/2015  . Esophageal reflux 02/15/2015  . Late menses 07/30/2014  . Morbid obesity ([place]) 07/30/2014  . Asthma without acute exacerbation 07/30/2014  . IBS (irritable bowel syndrome) 07/30/2014  . Elevated BP 07/30/2014  . PCOS (polycystic ovarian syndrome) 11/25/2012  . Carpal tunnel syndrome, bilateral     Past Surgical History:  Procedure Laterality Date  . TONSILECTOMY, ADENOIDECTOMY, BILATERAL MYRINGOTOMY AND  TUBES  1994    OB History    No data available       Home Medications    Prior to Admission medications   Medication Sig Start Date End Date Taking? Authorizing Provider  Ascorbic Acid (VITAMIN C) 1000 MG tablet Take 1,000 mg by mouth daily.   Yes [provider]  BIOTIN 5000 PO Take 10,000 mg by mouth daily.    Yes [provider]  fluticasone (FLONASE) 50 MCG/ACT nasal spray Place 2 sprays into both nostrils daily. Patient taking differently: Place 2 sprays into both nostrils daily as needed for allergies.  03/23/16  Yes Roma Schanz R, DO  levocetirizine (XYZAL) 5 MG tablet Take 1 tablet (5 mg total) by mouth every evening. Patient taking differently: Take 5 mg by mouth at bedtime as needed for allergies.  03/23/16  Yes Roma Schanz R, DO  metoprolol succinate (TOPROL-XL) 50 MG 24 hr tablet Take 1 tablet (50 mg total) by mouth daily. Take with or immediately following a meal. 01/01/17  Yes Roma Schanz R, DO  Multiple Vitamins-Calcium (ONE-A-DAY WOMENS PO) Take 1 tablet by mouth daily.    Yes [provider]  norethindrone-ethinyl estradiol (JUNEL FE 1/20) 1-20 MG-MCG tablet Take 1 tablet by mouth daily. 01/06/16  Yes Roma Schanz R, DO  SUMAtriptan (IMITREX) 100 MG tablet Take 1 tablet (100 mg total) by mouth 2 (two) times daily as needed for migraine. 02/02/17  Yes Kathrynn Ducking, MD  fluconazole (DIFLUCAN) 150 MG tablet Take 1 tablet  by mouth once, may repeat in 3 days. Patient not taking: Reported on 02/22/2017 01/25/17   Carollee Herter, Alferd Apa, DO  metroNIDAZOLE (METROGEL) 0.75 % vaginal gel 1 application for 5 nights, then 1 application twice weekly for 4 to 6 months 01/25/17   Ann Held, DO    Family History Family History  Problem Relation Age of Onset  . Hypertension Mother   . Hyperlipidemia Mother   . Stroke Father   . Diabetes Maternal Uncle   . Heart disease Maternal Grandmother   . Colon cancer Neg Hx      Social History Social History  Substance Use Topics  . Smoking status: Former Smoker    Types: Cigarettes  . Smokeless tobacco: Never Used  . Alcohol use 1.8 oz/week    3 Glasses of wine per week     Allergies   Metformin and related   Review of Systems Review of Systems  Neurological: Positive for headaches.  All other systems reviewed and are negative.    Physical Exam Updated Vital Signs BP 129/74   Pulse 92   Temp 98.4 F (36.9 C) (Oral)   Resp 18   Ht 5\' 6"  (1.676 m)   Wt (!) 198.7 kg (438 lb)   LMP 02/15/2017   SpO2 100%   BMI 70.70 kg/m   Physical Exam  Constitutional: She is oriented to person, place, and time. She appears well-developed and well-nourished.  Obese  HENT:  Head: Normocephalic and atraumatic.  Right Ear: External ear normal.  Left Ear: External ear normal.  Eyes: EOM are normal. Pupils are equal, round, and reactive to light.  Neck: Neck supple.  Cardiovascular: Normal rate and regular rhythm.   No murmur heard. Pulmonary/Chest: Effort normal and breath sounds normal. No respiratory distress.  Abdominal: Soft. There is no tenderness. There is no rebound and no guarding.  Musculoskeletal: She exhibits no edema or tenderness.  Neurological: She is alert and oriented to person, place, and time. No cranial nerve deficit.  5 out of 5 strength in all 4 extremities with sensation to light touch intact in all 4 extremities.  Skin: Skin is warm and dry.  Psychiatric: She has a normal mood and affect. Her behavior is normal.  Nursing note and vitals reviewed.    ED Treatments / Results  Labs (all labs ordered are listed, but only abnormal results are displayed) Labs Reviewed  COMPREHENSIVE METABOLIC PANEL  CBC WITH DIFFERENTIAL/PLATELET  CBC WITH DIFFERENTIAL/PLATELET  I-STAT BETA HCG BLOOD, ED ([facility], [facility], [facility] ONLY)    EKG  EKG Interpretation None       Radiology Dg Chest 2 View  Result Date: 02/22/2017 CLINICAL DATA:   Migraine and weakness EXAM: CHEST  2 VIEW COMPARISON:  None. FINDINGS: The heart size and mediastinal contours are within normal limits. Both lungs are clear. The visualized skeletal structures are unremarkable. IMPRESSION: No active cardiopulmonary disease. Electronically Signed   By: Ulyses Jarred M.D.   On: 02/22/2017 20:08   Ct Head Wo Contrast  Result Date: 02/22/2017 CLINICAL DATA:  Initial evaluation for acute headache, numbness in hands. EXAM: CT HEAD WITHOUT CONTRAST TECHNIQUE: Contiguous axial images were obtained from the base of the skull through the vertex without intravenous contrast. COMPARISON:  None. FINDINGS: Brain: Cerebral volume normal for age. No evidence for acute intracranial hemorrhage. No findings to suggest acute large vessel territory infarct. There are a few patchy subcentimeter hypodensities involving the periventricular and subcortical white matter, most prominent posteriorly  at the left periatrial white matter (series 2, image 17). Findings are nonspecific. No mass lesion or midline shift. No hydrocephalus. No extra-axial fluid collection. Vascular: No hyperdense vessel. Skull: Scalp soft tissues and calvarium within normal limits. Sinuses/Orbits: Globes orbital soft tissues within normal limits. Paranasal sinuses and mastoid air cells are clear. IMPRESSION: 1. Few scattered subcentimeter hypodensities involving the periventricular and subcortical white matter of both cerebral hemispheres. Findings are nonspecific, with differential considerations including accelerated small vessel ischemic changes for patient age, possible demyelinating disease, or changes related to migrainous disorder. Findings could be further assessed with dedicated brain MRI as clinically desired. 2. No other acute intracranial process. Electronically Signed   By: Jeannine Boga M.D.   On: 02/22/2017 20:32    Procedures Procedures (including critical care time)  Medications Ordered in  ED Medications  metoCLOPramide (REGLAN) injection 10 mg (0 mg Intravenous Hold 02/22/17 2248)  diphenhydrAMINE (BENADRYL) injection 25 mg (0 mg Intravenous Hold 02/22/17 2248)  promethazine (PHENERGAN) injection 25 mg (25 mg Intramuscular Refused 02/22/17 2249)  ketorolac (TORADOL) injection 60 mg (60 mg Intramuscular Given 02/22/17 2115)     Initial Impression / Assessment and Plan / ED Course  I have reviewed the triage vital signs and the nursing notes.  Pertinent labs & imaging results that were available during my care of the patient were reviewed by me and considered in my medical decision making (see chart for details).     Patient with history of migraine headache here with progressive headache. She describes paresthesias to her right second and third digit but she is neurologically intact on examination. Patient's headache improved following treatments in the emergency department. Presentation is not consistent with subarachnoid hemorrhage, ruptured aneurysm, dural sinus thrombosis, meningitis. Discussed with patient findings of CT scan and importance of outpatient follow-up. Due to patient's habitus she is not a candidate for inpatient MRI. Discussed importance of outpatient follow-up and return precautions.  Final Clinical Impressions(s) / ED Diagnoses   Final diagnoses:  Bad headache  Paresthesia    New Prescriptions Discharge Medication List as of 02/22/2017 10:48 PM       Quintella Reichert, MD 02/22/17 2333

## 2017-02-22 NOTE — Telephone Encounter (Signed)
Caller name: Sameeha Rockefeller Relationship to patient: self Can be reached: [phone]  Reason for call: Pt called in stating migraine for 2 days. She has taken meds but it is not helping. She is feeling weakness x 2 days. She slept a lot yesterday but does not feel better. She feels disoriented, just not like herself. She is having tingling in 2 fingers on her right hand. Transferred to [facility] with [facility].

## 2017-02-22 NOTE — Discharge Instructions (Signed)
Please follow up with Dr. Jannifer Franklin with Neurology regarding your headache and CT scan of your brain.  You will need more imaging with MRI of your brain.

## 2017-02-22 NOTE — Telephone Encounter (Signed)
[facility] - Client [facility] Patient Name: URANIA PEARLMAN DOB: 1990/09/19 Initial Comment Caller is having weakness, confusion , tingling in fingers on right hand. Nurse Assessment Nurse: Cox, RN, [facility] Date/Time (Eastern Time): 02/22/2017 3:16:06 PM Confirm and document reason for call. If symptomatic, describe symptoms. ---Caller states she has weakness, confusion, tingling in right hand. Tingling started 1 week ago. weakness started yesterday. Does the patient have any new or worsening symptoms? ---Yes Will a triage be completed? ---Yes Related visit to physician within the last 2 weeks? ---No Does the PT have any chronic conditions? (i.e. diabetes, asthma, etc.) ---Yes List chronic conditions. ---migraines- saw neurologist Is the patient pregnant or possibly pregnant? (Ask all females between the ages of 65-55) ---No Is this a behavioral health or substance abuse call? ---No Guidelines Guideline TitleAffirmed Question Affirmed Notes Dizziness - Lightheadedness Difficulty breathing Final Disposition User Go to ED Now Cox, RN, [facility] Referrals GO TO FACILITY OTHER - SPECIFY Disagree/Comply: Comply Call Id: [id number]

## 2017-02-22 NOTE — ED Triage Notes (Signed)
Pt with headache since yesterday.  Worse with movement.  Pt states hx of migraines.  Pt also c/o numbness in both hands.  Carpal tunnel.  Worse in right.

## 2017-02-23 ENCOUNTER — Telehealth: Payer: Self-pay | Admitting: Neurology

## 2017-02-23 DIAGNOSIS — G43611 Persistent migraine aura with cerebral infarction, intractable, with status migrainosus: Secondary | ICD-10-CM

## 2017-02-23 DIAGNOSIS — I639 Cerebral infarction, unspecified: Secondary | ICD-10-CM

## 2017-02-23 NOTE — Telephone Encounter (Signed)
Patient called office in reference to going to ED last night due to bad headache upon discharge they advised patient contact our office to see if Dr. Jannifer Franklin will order a MRI of the brain w/wout contrast.  Please call

## 2017-02-26 NOTE — Addendum Note (Signed)
Addended by: Kathrynn Ducking on: 02/26/2017 05:28 PM   Modules accepted: Orders

## 2017-02-26 NOTE — Telephone Encounter (Signed)
Pt was in  ED for migraine.  CT head done.  Asking about doing MRI. Please advise.

## 2017-02-26 NOTE — Telephone Encounter (Signed)
I called patient. CT of the brain done recently does show some white matter changes, given this patient's age, I will go ahead and get MRI of the brain.  CT head 02/22/17:  IMPRESSION: 1. Few scattered subcentimeter hypodensities involving the periventricular and subcortical white matter of both cerebral hemispheres. Findings are nonspecific, with differential considerations including accelerated small vessel ischemic changes for patient age, possible demyelinating disease, or changes related to migrainous disorder. Findings could be further assessed with dedicated brain MRI as clinically desired. 2. No other acute intracranial process.

## 2017-03-09 ENCOUNTER — Other Ambulatory Visit: Payer: Self-pay | Admitting: Family Medicine

## 2017-03-09 DIAGNOSIS — E282 Polycystic ovarian syndrome: Secondary | ICD-10-CM

## 2017-03-15 ENCOUNTER — Ambulatory Visit
Admission: RE | Admit: 2017-03-15 | Discharge: 2017-03-15 | Disposition: A | Discharge status: Home / Self Care | Payer: Managed Care, Other (non HMO) | Source: Ambulatory Visit | Attending: Neurology | Admitting: Neurology

## 2017-03-15 DIAGNOSIS — G43611 Persistent migraine aura with cerebral infarction, intractable, with status migrainosus: Secondary | ICD-10-CM | POA: Diagnosis not present

## 2017-03-15 DIAGNOSIS — I639 Cerebral infarction, unspecified: Principal | ICD-10-CM

## 2017-03-18 ENCOUNTER — Telehealth: Payer: Self-pay | Admitting: Neurology

## 2017-03-18 NOTE — Telephone Encounter (Signed)
I called the patient. The MRI shows minimal WM changes, may be C/W individuals with a history of migraine.  May follow the MRI in 12 to 18 months looking for changes.   MRI brain 03/16/17:  IMPRESSION:  Abnormal MRI scan of the brain showing scattered tiny periventricular and subcortical nonspecific white matter hyperintensities with the differential discussed above.no enhancing lesions are noted.

## 2017-03-21 ENCOUNTER — Encounter: Payer: Self-pay | Admitting: Neurology

## 2017-03-29 ENCOUNTER — Encounter: Payer: Managed Care, Other (non HMO) | Admitting: Family Medicine

## 2017-05-09 ENCOUNTER — Other Ambulatory Visit: Payer: Self-pay | Admitting: Family Medicine

## 2017-05-09 ENCOUNTER — Ambulatory Visit (INDEPENDENT_AMBULATORY_CARE_PROVIDER_SITE_OTHER): Payer: Managed Care, Other (non HMO) | Admitting: Family Medicine

## 2017-05-09 ENCOUNTER — Encounter: Payer: Self-pay | Admitting: Family Medicine

## 2017-05-09 VITALS — BP 136/61 | HR 95 | Temp 99.3°F | Ht 66.0 in | Wt >= 6400 oz

## 2017-05-09 DIAGNOSIS — I10 Essential (primary) hypertension: Secondary | ICD-10-CM

## 2017-05-09 DIAGNOSIS — M546 Pain in thoracic spine: Secondary | ICD-10-CM | POA: Diagnosis not present

## 2017-05-09 DIAGNOSIS — G43809 Other migraine, not intractable, without status migrainosus: Secondary | ICD-10-CM

## 2017-05-09 DIAGNOSIS — G43909 Migraine, unspecified, not intractable, without status migrainosus: Secondary | ICD-10-CM | POA: Insufficient documentation

## 2017-05-09 MED ORDER — METOPROLOL SUCCINATE ER 50 MG PO TB24: 50.0000 mg | ORAL_TABLET | Freq: Every day | ORAL | 1 refills | Status: DC

## 2017-05-09 MED ORDER — MELOXICAM 15 MG PO TABS: 15.0000 mg | ORAL_TABLET | Freq: Every day | ORAL | 0 refills | Status: DC

## 2017-05-09 MED ORDER — CYCLOBENZAPRINE HCL 5 MG PO TABS: 5.0000 mg | ORAL_TABLET | Freq: Three times a day (TID) | ORAL | 1 refills | Status: DC | PRN

## 2017-05-09 MED ORDER — METHOCARBAMOL 500 MG PO TABS: 500.0000 mg | ORAL_TABLET | Freq: Three times a day (TID) | ORAL | 0 refills | Status: DC | PRN

## 2017-05-09 NOTE — Progress Notes (Signed)
Robaxin is on back order. Will send flexeril.   Rosemarie Ax, MD [facility] 05/09/2017, 2:09 PM

## 2017-05-09 NOTE — Assessment & Plan Note (Signed)
This appears to be related to muscular strain in nature. She denies any injury. - Mobic Flexeril provided - Prednisone exercises for neck and scapular stabilization - If no improvement could consider formal physical therapy versus trigger point injection

## 2017-05-09 NOTE — Progress Notes (Addendum)
BRANDILEE PIES - 26 y.o. female MRN [id number]  Date of birth: Mar 07, 1991  SUBJECTIVE:  Including CC & ROS.  Chief Complaint  Patient presents with  . Back Pain    Patient is here today C/O back pain that started on 9.8.18. Pain starts at the base of neck down through shoulder blades.  She climbed a lot of stairs at a concert and was pulling a lot with her left arm which is sore as well.  Could not sleep due to the pain and is at a 7 out of 10.  Has Tx with Tylenol and does not help.  Cannot take Ibuprofen due to SE of diarrhea.    Ms. Megan Salon is a 26 year old female is presenting with upper back pain. She reports this pain started over the weekend. The pain is severe and can reach a 10 out of 10. She has not tried any medications. She has tried a warm shower and a heating pad. These are not helpful she denies any previous symptoms similar to this. She denies any injury or previous surgery. She denies any radicular type symptoms. She does report she has a history of carpal tunnel disease. She reports the pain kept her up last night and she was unable to sleep. She reports that the pain is sharp in nature.   She needs a refill of her metoprolol. She reports that she has a follow-up with her neurologist next month. She has a history of migraines and that is why she is seeing them.   Review of Systems  Constitutional: Negative for fever.  Musculoskeletal: Positive for back pain. Negative for gait problem and neck pain.  Skin: Negative for color change.  Neurological: Negative for weakness.    HISTORY: Past Medical, Surgical, Social, and Family History Reviewed & Updated per EMR.   Pertinent Historical Findings include:  Past Medical History:  Diagnosis Date  . Asthma   . Carpal tunnel syndrome, bilateral   . Common migraine with intractable migraine 02/02/2017  . Morbidly obese ([place])   . PCOS (polycystic ovarian syndrome)     Past Surgical History:  Procedure Laterality Date  .  TONSILECTOMY, ADENOIDECTOMY, BILATERAL MYRINGOTOMY AND TUBES  1994    Allergies  Allergen Reactions  . Ibuprofen Diarrhea  . Metformin And Related Diarrhea    Family History  Problem Relation Age of Onset  . Hypertension Mother   . Hyperlipidemia Mother   . Stroke Father   . Diabetes Maternal Uncle   . Heart disease Maternal Grandmother   . Colon cancer Neg Hx      Social History   Social History  . Marital status: Single    Spouse name: n/a  . Number of children: 0  . Years of education: 31   Occupational History  . sales time Forensic scientist    Social History Main Topics  . Smoking status: Former Smoker    Types: Cigarettes  . Smokeless tobacco: Never Used  . Alcohol use 1.8 oz/week    3 Glasses of wine per week  . Drug use: Yes    Types: Marijuana     Comment: once every 6 months  . Sexual activity: Yes    Partners: Male    Birth control/ protection: Condom     Comment: inconsistent condom use   Other Topics Concern  . Not on file   Social History Narrative   Lives with a roommate.     Parents live in [place], [place].   Right handed  Caffeine use: coffee weekly        PHYSICAL EXAM:  VS: BP 136/61 (BP Location: Right Arm, Patient Position: Sitting, Cuff Size: Large)   Pulse 95   Temp 99.3 F (37.4 C) (Oral)   Ht 5\' 6"  (1.676 m)   Wt (!) 441 lb 6.4 oz (200.2 kg)   LMP 04/30/2017   SpO2 98%   BMI 71.24 kg/m  Physical Exam Gen: NAD, alert, cooperative with exam, well-appearing, morbidly obese ENT: normal lips, normal nasal mucosa,  Eye: normal EOM, normal conjunctiva and lids CV:  no edema, +2 pedal pulses   Resp: no accessory muscle use, non-labored,  Skin: no rashes, no areas of induration  Neuro: normal tone, normal sensation to touch Psych:  normal insight, alert and oriented MSK:  Neck/Back: Some tenderness to palpation of the trapezius on the left. No tenderness to palpation of the rhomboids. No signs of atrophy, swelling, or  ecchymosis Normal neck range of motion. Exacerbation of her pain with extension and lateral bending to the right Normal strength with shrug. No wheezing of the scapula. Normal range of motion of the shoulders. Negative Spurling's test. Normal strength in upper extremities to resistance. Normal grip strength. Normal pincer grasp. Neurovascularly intact    ASSESSMENT & PLAN:   Acute left-sided thoracic back pain This appears to be related to muscular strain in nature. She denies any injury. - Mobic Flexeril provided - Prednisone exercises for neck and scapular stabilization - If no improvement could consider formal physical therapy versus trigger point injection  Migraine Has follow-up with the neurologist in a few weeks.   Essential hypertension Metoprolol was refilled today.

## 2017-05-09 NOTE — Patient Instructions (Signed)
Thank you for coming in,   Please try the exercises   Please to the meloxciam 10 days straight and then as needed.   Please follow up with me if you don't have any improvement.    Please feel free to call with any questions or concerns at any time, at [phone]. --Dr. Raeford Razor

## 2017-05-09 NOTE — Assessment & Plan Note (Signed)
Metoprolol was refilled today...

## 2017-05-09 NOTE — Assessment & Plan Note (Signed)
Has follow-up with the neurologist in a few weeks.

## 2017-05-16 ENCOUNTER — Encounter: Payer: Self-pay | Admitting: Family Medicine

## 2017-05-16 NOTE — Telephone Encounter (Signed)
This was routed to me from the clinical pool.  Patient would like to know what else she can take for pain.

## 2017-05-23 ENCOUNTER — Encounter: Payer: Self-pay | Admitting: Family Medicine

## 2017-05-23 ENCOUNTER — Ambulatory Visit (INDEPENDENT_AMBULATORY_CARE_PROVIDER_SITE_OTHER): Payer: Managed Care, Other (non HMO) | Admitting: Family Medicine

## 2017-05-23 ENCOUNTER — Ambulatory Visit: Payer: Managed Care, Other (non HMO) | Admitting: Adult Health

## 2017-05-23 VITALS — BP 137/83 | HR 91 | Temp 97.9°F | Ht 66.0 in | Wt >= 6400 oz

## 2017-05-23 DIAGNOSIS — G8929 Other chronic pain: Secondary | ICD-10-CM | POA: Diagnosis not present

## 2017-05-23 DIAGNOSIS — M546 Pain in thoracic spine: Secondary | ICD-10-CM | POA: Diagnosis not present

## 2017-05-23 MED ORDER — CYCLOBENZAPRINE HCL 10 MG PO TABS: 10.0000 mg | ORAL_TABLET | Freq: Three times a day (TID) | ORAL | 0 refills | Status: DC | PRN

## 2017-05-23 NOTE — Assessment & Plan Note (Signed)
Info for healthy weight and wellness given to pt

## 2017-05-23 NOTE — Assessment & Plan Note (Signed)
Inc flexeril Refer to pt Will refer to Dr Tamala Julian-- I think she may benefit from OMT but will let him make that decision  Discussed diet and exercise as well

## 2017-05-23 NOTE — Progress Notes (Signed)
Patient ID: Brandy Saunders, female    DOB: 1991-01-12  Age: 26 y.o. MRN: [id number]    Subjective:  Subjective  HPI Brandy Saunders presents for con't back pain.  No better. See last ov with dr schmitz   Review of Systems  Constitutional: Negative for activity change, appetite change, fatigue and unexpected weight change.  Respiratory: Negative for cough and shortness of breath.   Cardiovascular: Negative for chest pain and palpitations.  Musculoskeletal: Positive for back pain.  Psychiatric/Behavioral: Negative for behavioral problems and dysphoric mood. The patient is not nervous/anxious.     History Past Medical History:  Diagnosis Date  . Asthma   . Carpal tunnel syndrome, bilateral   . Common migraine with intractable migraine 02/02/2017  . Morbidly obese ([place])   . PCOS (polycystic ovarian syndrome)     She has a past surgical history that includes Tonsilectomy, adenoidectomy, bilateral myringotomy and tubes (1994).   Her family history includes Diabetes in her maternal uncle; Heart disease in her maternal grandmother; Hyperlipidemia in her mother; Hypertension in her mother; Stroke in her father.She reports that she has quit smoking. Her smoking use included Cigarettes. She has never used smokeless tobacco. She reports that she drinks about 1.8 oz of alcohol per week . She reports that she uses drugs, including Marijuana.  Current Outpatient Prescriptions on File Prior to Visit  Medication Sig Dispense Refill  . Ascorbic Acid (VITAMIN C) 1000 MG tablet Take 1,000 mg by mouth daily.    Marland Kitchen BIOTIN 5000 PO Take 10,000 mg by mouth daily.     Marland Kitchen BLISOVI FE 1/20 1-20 MG-MCG tablet TAKE 1 TABLET DAILY 84 tablet 4  . fluticasone (FLONASE) 50 MCG/ACT nasal spray Place 2 sprays into both nostrils daily. (Patient taking differently: Place 2 sprays into both nostrils daily as needed for allergies. ) 16 g 6  . levocetirizine (XYZAL) 5 MG tablet Take 1 tablet (5 mg total) by mouth every  evening. (Patient taking differently: Take 5 mg by mouth at bedtime as needed for allergies. ) 30 tablet 5  . metoprolol succinate (TOPROL-XL) 50 MG 24 hr tablet Take 1 tablet (50 mg total) by mouth daily. Take with or immediately following a meal. 30 tablet 1  . metroNIDAZOLE (METROGEL) 0.75 % vaginal gel 1 application for 5 nights, then 1 application twice weekly for 4 to 6 months 70 g 3  . Multiple Vitamins-Calcium (ONE-A-DAY WOMENS PO) Take 1 tablet by mouth daily.     . SUMAtriptan (IMITREX) 100 MG tablet Take 1 tablet (100 mg total) by mouth 2 (two) times daily as needed for migraine. 10 tablet 3   No current facility-administered medications on file prior to visit.      Objective:  Objective  Physical Exam  Constitutional: She is oriented to person, place, and time. She appears well-developed and well-nourished.  HENT:  Head: Normocephalic and atraumatic.  Eyes: Conjunctivae and EOM are normal.  Neck: Normal range of motion. Neck supple. No JVD present. Carotid bruit is not present. No thyromegaly present.  Cardiovascular: Normal rate, regular rhythm and normal heart sounds.   No murmur heard. Pulmonary/Chest: Effort normal and breath sounds normal. No respiratory distress. She has no wheezes. She has no rales. She exhibits no tenderness.  Musculoskeletal: She exhibits tenderness. She exhibits no edema.       Right shoulder: She exhibits pain and spasm.       Arms: Neurological: She is alert and oriented to person, place, and time.  Psychiatric: She has a normal mood and affect.  Nursing note and vitals reviewed.  BP 137/83   Pulse 91   Temp 97.9 F (36.6 C) (Oral)   Ht 5\' 6"  (1.676 m)   Wt (!) 448 lb (203.2 kg)   LMP 04/30/2017   SpO2 98%   BMI 72.31 kg/m  Wt Readings from Last 3 Encounters:  05/23/17 (!) 448 lb (203.2 kg)  05/09/17 (!) 441 lb 6.4 oz (200.2 kg)  02/22/17 (!) 438 lb (198.7 kg)     Lab Results  Component Value Date   WBC 5.8 04/11/2016   HGB 12.6  04/11/2016   HCT 37.5 04/11/2016   PLT 278.0 04/11/2016   GLUCOSE 89 02/22/2017   CHOL 172 04/11/2016   TRIG 90.0 04/11/2016   HDL 60.20 04/11/2016   LDLCALC 94 04/11/2016   ALT 26 02/22/2017   AST 22 02/22/2017   NA 137 02/22/2017   K 4.0 02/22/2017   CL 105 02/22/2017   CREATININE 0.68 02/22/2017   BUN 16 02/22/2017   CO2 23 02/22/2017   TSH 1.49 04/11/2016   HGBA1C 5.8 01/07/2016    Mr Brain W Wo Contrast  Result Date: 03/16/2017  [facility] [address], [place], [place] [address] [phone] NEUROIMAGING REPORT STUDY DATE: 03/15/17 PATIENT NAME: Brandy Saunders DOB: [birth year] MRN: [id number] ORDERING CLINICIAN: Dr Brandy Saunders CLINICAL HISTORY:  50 year lady with refractory headache COMPARISON FILMS: CT Head 02/22/17 EXAM: MRI Brain w/wo TECHNIQUE: MRI of the brain with and without contrast was obtained utilizing 5 mm axial slices with T1, T2, T2 flair, T2 star gradient echo and diffusion weighted views.  T1 sagittal, T2 coronal and postcontrast views in the axial and coronal plane were obtained. CONTRAST:20 mL IV multihance IMAGING SITE: [facility] FINDINGS: The Brain parenchyma shows a few scattered tiny periventricular and subcortical nonspecific white matter hyperintensities which may be seen in a variety of conditions including demyelinating disease, small vessel disease, vasculitis,migraine, autoimmune, infectious or intermittently conditions. The largest lesion is 5 mm in the left periatrial and is cystic with mild gliosis surrounding.These lesions do not involve the brain stem or corpus callosum. No enhancing lesions are noted. No other structural lesion,   tumor or infarcts are noted.No abnormal lesions are seen on diffusion-weighted views to suggest acute ischemia. The cortical sulci, fissures and cisterns are normal in size and appearance. Lateral, third and fourth ventricle are normal in size and appearance. No extra-axial fluid collections are  seen. No evidence of mass effect or midline shift.  No abnormal lesions are seen on post contrast views.  On sagittal views the posterior fossa, pituitary gland and corpus callosum are unremarkable. No evidence of intracranial hemorrhage on gradient-echo views. The orbits and their contents, paranasal sinuses and calvarium are unremarkable.  Intracranial flow voids are present.    Abnormal MRI scan of the brain showing scattered tiny periventricular and subcortical nonspecific white matter hyperintensities with the differential discussed above.no enhancing lesions are noted. INTERPRETING PHYSICIAN: Antony Contras, MD Certified in  Neuroimaging by [employer] and [employer]     [place]:  Plan  I have discontinued Brandy Saunders's meloxicam, methocarbamol, and cyclobenzaprine. I am also having her start on cyclobenzaprine. Additionally, I am having her maintain her Multiple Vitamins-Calcium (ONE-A-DAY WOMENS PO), BIOTIN 5000 PO, levocetirizine, fluticasone, vitamin C, metroNIDAZOLE, SUMAtriptan, BLISOVI FE 1/20, and metoprolol succinate.  Meds ordered this encounter  Medications  . cyclobenzaprine (FLEXERIL) 10  MG tablet    Sig: Take 1 tablet (10 mg total) by mouth 3 (three) times daily as needed for muscle spasms.    Dispense:  30 tablet    Refill:  0    Problem List Items Addressed This Visit      Unprioritized   Chronic left-sided thoracic back pain - [place] flexeril Refer to pt Will refer to Dr Tamala Julian-- I think she may benefit from OMT but will let him make that decision  Discussed diet and exercise as well      Relevant Medications   cyclobenzaprine (FLEXERIL) 10 MG tablet   Other Relevant Orders   Ambulatory referral to Sports Medicine   Ambulatory referral to Physical Therapy   Morbid obesity ([place])    Info for healthy weight and wellness given to pt         Follow-up: Return if symptoms worsen or fail  to improve.  Ann Held, DO

## 2017-05-23 NOTE — Patient Instructions (Signed)

## 2017-05-30 ENCOUNTER — Encounter: Payer: Self-pay | Admitting: Adult Health

## 2017-05-30 ENCOUNTER — Ambulatory Visit: Payer: Managed Care, Other (non HMO) | Admitting: Physical Therapy

## 2017-05-30 ENCOUNTER — Ambulatory Visit (INDEPENDENT_AMBULATORY_CARE_PROVIDER_SITE_OTHER): Payer: Managed Care, Other (non HMO) | Admitting: Adult Health

## 2017-05-30 VITALS — BP 141/81 | HR 92 | Ht 66.0 in | Wt >= 6400 oz

## 2017-05-30 DIAGNOSIS — G43009 Migraine without aura, not intractable, without status migrainosus: Secondary | ICD-10-CM | POA: Diagnosis not present

## 2017-05-30 MED ORDER — SUMATRIPTAN SUCCINATE 100 MG PO TABS: 100.0000 mg | ORAL_TABLET | Freq: Two times a day (BID) | ORAL | 11 refills | Status: DC | PRN

## 2017-05-30 NOTE — Patient Instructions (Signed)
Your Plan:  Continue Imitrex as needed for migraine headaches If your symptoms worsen or you develop new symptoms please let us know.   Thank you for coming to see [place] at [name]. I hope we have been able to provide you high quality care today.  You may receive a patient satisfaction survey over the next few weeks. We would appreciate your feedback and comments so that we may continue to improve ourselves and the health of our patients.

## 2017-05-30 NOTE — Progress Notes (Signed)
PATIENT: Brandy Saunders DOB: 1991-07-17  REASON FOR VISIT: follow up- migraines HISTORY FROM: patient  HISTORY OF PRESENT ILLNESS: Today 05/30/17 [name] is a 26 year old female with a history of migraine headaches. She returns today for follow-up. She states in the last month her headache frequency has decreased significantly. She states in the month of September she had only one headache. Her headache tends to occur on the right side although occasionally will switch over to the left side. She does have photophobia but denies phonophobia. She also has nausea but typically not vomiting. She denies any visual changes. She returns today for an evaluation.  HISTORY 02/02/17: [name] is a 26 year old right-handed black female with a history of morbid obesity and migraine headaches. The patient has had migraine for about 10 years, the headaches are almost always on the right side of the head, very rarely she may have a left-sided headache. The patient indicates that the headaches usually occur during a "let down period", usually on a Friday or Saturday after a week of work. The headaches may last 24 hours. The patient indicates that she gets some sparkling lights in the vision at times, she may have some blurring of vision and she may have photophobia and phonophobia. She will have nausea without vomiting. The patient does have some neck stiffness and scalp soreness with the headache. The patient denies any family history of migraine, she does not know of any other activating factors for headache other than what was noted above. The patient does not take in much caffeine during the week, she may have occasional cups of coffee. The patient takes Tylenol or Excedrin Migraine for the headache with some benefit. The patient reports some more persistent numbness of the hands, she denies any numbness or weakness elsewhere. She has never been on any prescription medications for her migraine. She will  have about 3-4 headaches a month. Because the headaches occur on the weekend, she is not missing work.   REVIEW OF SYSTEMS: Out of a complete 14 system review of symptoms, the patient complains only of the following symptoms, and all other reviewed systems are negative.  Shortness of breath, excessive thirst, joint pain, back pain, neck pain, neck stiffness  ALLERGIES: Allergies  Allergen Reactions  . Ibuprofen Diarrhea  . Metformin And Related Diarrhea    HOME MEDICATIONS: Outpatient Medications Prior to Visit  Medication Sig Dispense Refill  . Ascorbic Acid (VITAMIN C) 1000 MG tablet Take 1,000 mg by mouth daily.    Marland Kitchen BIOTIN 5000 PO Take 10,000 mg by mouth daily.     Marland Kitchen BLISOVI FE 1/20 1-20 MG-MCG tablet TAKE 1 TABLET DAILY 84 tablet 4  . cyclobenzaprine (FLEXERIL) 10 MG tablet Take 1 tablet (10 mg total) by mouth 3 (three) times daily as needed for muscle spasms. 30 tablet 0  . fluticasone (FLONASE) 50 MCG/ACT nasal spray Place 2 sprays into both nostrils daily. (Patient taking differently: Place 2 sprays into both nostrils daily as needed for allergies. ) 16 g 6  . levocetirizine (XYZAL) 5 MG tablet Take 1 tablet (5 mg total) by mouth every evening. (Patient taking differently: Take 5 mg by mouth at bedtime as needed for allergies. ) 30 tablet 5  . metoprolol succinate (TOPROL-XL) 50 MG 24 hr tablet Take 1 tablet (50 mg total) by mouth daily. Take with or immediately following a meal. 30 tablet 1  . metroNIDAZOLE (METROGEL) 0.75 % vaginal gel 1 application for 5 nights,  then 1 application twice weekly for 4 to 6 months 70 g 3  . Multiple Vitamins-Calcium (ONE-A-DAY WOMENS PO) Take 1 tablet by mouth daily.     . SUMAtriptan (IMITREX) 100 MG tablet Take 1 tablet (100 mg total) by mouth 2 (two) times daily as needed for migraine. 10 tablet 3   No facility-administered medications prior to visit.     PAST MEDICAL HISTORY: Past Medical History:  Diagnosis Date  . Asthma   . Carpal  tunnel syndrome, bilateral   . Common migraine with intractable migraine 02/02/2017  . Morbidly obese ([place])   . PCOS (polycystic ovarian syndrome)     PAST SURGICAL HISTORY: Past Surgical History:  Procedure Laterality Date  . TONSILECTOMY, ADENOIDECTOMY, BILATERAL MYRINGOTOMY AND TUBES  1994    FAMILY HISTORY: Family History  Problem Relation Age of Onset  . Hypertension Mother   . Hyperlipidemia Mother   . Stroke Father   . Diabetes Maternal Uncle   . Heart disease Maternal Grandmother   . Colon cancer Neg Hx     SOCIAL HISTORY: Social History   Social History  . Marital status: Single    Spouse name: n/a  . Number of children: 0  . Years of education: 63   Occupational History  . sales time Forensic scientist    Social History Main Topics  . Smoking status: Former Smoker    Types: Cigarettes  . Smokeless tobacco: Never Used  . Alcohol use 1.8 oz/week    3 Glasses of wine per week  . Drug use: Yes    Types: Marijuana     Comment: once every 6 months  . Sexual activity: Yes    Partners: Male    Birth control/ protection: Condom     Comment: inconsistent condom use   Other Topics Concern  . Not on file   Social History Narrative   Lives with a roommate.     Parents live in [place], [place].   Right handed   Caffeine use: coffee weekly         PHYSICAL EXAM  Vitals:   05/30/17 1326  BP: (!) 141/81  Pulse: 92  Weight: (!) 441 lb 6.4 oz (200.2 kg)  Height: 5\' 6"  (1.676 m)   Body mass index is 71.24 kg/m.  Generalized: Well developed, in no acute distress , Obese  Neurological examination  Mentation: Alert oriented to time, place, history taking. Follows all commands speech and language fluent Cranial nerve II-XII: Pupils were equal round reactive to light. Extraocular movements were full, visual field were full on confrontational test. Facial sensation and strength were normal. Uvula tongue midline. Head turning and shoulder shrug  were normal and  symmetric. Motor: The motor testing reveals 5 over 5 strength of all 4 extremities. Good symmetric motor tone is noted throughout.  Sensory: Sensory testing is intact to soft touch on all 4 extremities. No evidence of extinction is noted.  Coordination: Cerebellar testing reveals good finger-nose-finger and heel-to-shin bilaterally.  Gait and station: Gait is normal. Reflexes: Deep tendon reflexes are symmetric and normal bilaterally.   DIAGNOSTIC DATA (LABS, IMAGING, TESTING) - I reviewed patient records, labs, notes, testing and imaging myself where available.  Lab Results  Component Value Date   WBC 5.8 04/11/2016   HGB 12.6 04/11/2016   HCT 37.5 04/11/2016   MCV 80.7 04/11/2016   PLT 278.0 04/11/2016      Component Value Date/Time   NA 137 02/22/2017 2044   K 4.0 02/22/2017 2044  CL 105 02/22/2017 2044   CO2 23 02/22/2017 2044   GLUCOSE 89 02/22/2017 2044   BUN 16 02/22/2017 2044   CREATININE 0.68 02/22/2017 2044   CREATININE 0.67 09/28/2014 1146   CALCIUM 9.4 02/22/2017 2044   PROT 8.1 02/22/2017 2044   ALBUMIN 4.3 02/22/2017 2044   AST 22 02/22/2017 2044   ALT 26 02/22/2017 2044   ALKPHOS 51 02/22/2017 2044   BILITOT 0.4 02/22/2017 2044   GFRNONAA >60 02/22/2017 2044   GFRAA >60 02/22/2017 2044   Lab Results  Component Value Date   CHOL 172 04/11/2016   HDL 60.20 04/11/2016   LDLCALC 94 04/11/2016   TRIG 90.0 04/11/2016   CHOLHDL 3 04/11/2016   Lab Results  Component Value Date   HGBA1C 5.8 01/07/2016   Lab Results  Component Value Date   VITAMINB12 862 01/07/2016   Lab Results  Component Value Date   TSH 1.49 04/11/2016      ASSESSMENT AND PLAN 26 y.o. year old female  has a past medical history of Asthma; Carpal tunnel syndrome, bilateral; Common migraine with intractable migraine (02/02/2017); Morbidly obese ([place]); and PCOS (polycystic ovarian syndrome). here with:  1. Migraine headache  Overall the patient's headaches are under relatively  good control. I've advised that she should continue using Imitrex as needed for acute headache therapy. She is advised that if her symptoms worsen or she develops new symptoms she should let us know. She will follow-up in 6 months or sooner if needed.  I spent 15 minutes with the patient. 50% of this time was spent discussing her headache therapy  Ward Givens, MSN, NP-C 05/30/2017, 1:44 PM [facility] [address], [place], [place] [address] [phone]

## 2017-05-30 NOTE — Progress Notes (Signed)
I have read the note, and I agree with the clinical assessment and plan.  Gerhart Ruggieri KEITH   

## 2017-06-06 ENCOUNTER — Telehealth: Payer: Self-pay | Admitting: *Deleted

## 2017-06-06 MED ORDER — MELOXICAM 15 MG PO TABS: 15.0000 mg | ORAL_TABLET | Freq: Every day | ORAL | 0 refills | Status: DC

## 2017-06-06 NOTE — Telephone Encounter (Signed)
Sent in [place].   Rosemarie Ax, MD [facility] 06/06/2017, 12:53 PM

## 2017-06-06 NOTE — Telephone Encounter (Signed)
[facility] sent request for refill Meloxicam 15mg  #30. Do you approve? Please Brandy Saunders

## 2017-06-11 ENCOUNTER — Encounter: Payer: Self-pay | Admitting: Family Medicine

## 2017-06-11 ENCOUNTER — Other Ambulatory Visit (HOSPITAL_COMMUNITY)
Admission: RE | Admit: 2017-06-11 | Discharge: 2017-06-11 | Disposition: A | Discharge status: Home / Self Care | Payer: Managed Care, Other (non HMO) | Source: Ambulatory Visit | Attending: Family Medicine | Admitting: Family Medicine

## 2017-06-11 ENCOUNTER — Ambulatory Visit (INDEPENDENT_AMBULATORY_CARE_PROVIDER_SITE_OTHER): Payer: Managed Care, Other (non HMO) | Admitting: Family Medicine

## 2017-06-11 VITALS — BP 134/90 | HR 89 | Temp 97.9°F | Ht 66.0 in | Wt >= 6400 oz

## 2017-06-11 DIAGNOSIS — N760 Acute vaginitis: Secondary | ICD-10-CM | POA: Diagnosis not present

## 2017-06-11 DIAGNOSIS — B9689 Other specified bacterial agents as the cause of diseases classified elsewhere: Secondary | ICD-10-CM | POA: Diagnosis not present

## 2017-06-11 DIAGNOSIS — Z0000 Encounter for general adult medical examination without abnormal findings: Secondary | ICD-10-CM

## 2017-06-11 DIAGNOSIS — E282 Polycystic ovarian syndrome: Secondary | ICD-10-CM | POA: Diagnosis not present

## 2017-06-11 DIAGNOSIS — Z202 Contact with and (suspected) exposure to infections with a predominantly sexual mode of transmission: Secondary | ICD-10-CM | POA: Insufficient documentation

## 2017-06-11 DIAGNOSIS — Z0001 Encounter for general adult medical examination with abnormal findings: Secondary | ICD-10-CM | POA: Diagnosis not present

## 2017-06-11 DIAGNOSIS — G43809 Other migraine, not intractable, without status migrainosus: Secondary | ICD-10-CM

## 2017-06-11 DIAGNOSIS — I10 Essential (primary) hypertension: Secondary | ICD-10-CM | POA: Diagnosis not present

## 2017-06-11 MED ORDER — METOPROLOL SUCCINATE ER 100 MG PO TB24: 100.0000 mg | ORAL_TABLET | Freq: Every day | ORAL | 3 refills | Status: DC

## 2017-06-11 NOTE — Assessment & Plan Note (Signed)
Poorly controlled will alter medications, encouraged DASH diet, minimize caffeine and obtain adequate sleep. Report concerning symptoms and follow up as directed and as needed 

## 2017-06-11 NOTE — Patient Instructions (Signed)
Preventive Care 18-39 Years, Female Preventive care refers to lifestyle choices and visits with your health care provider that can promote health and wellness. What does preventive care include?  A yearly physical exam. This is also called an annual well check.  Dental exams once or twice a year.  Routine eye exams. Ask your health care provider how often you should have your eyes checked.  Personal lifestyle choices, including: ? Daily care of your teeth and gums. ? Regular physical activity. ? Eating a healthy diet. ? Avoiding tobacco and drug use. ? Limiting alcohol use. ? Practicing safe sex. ? Taking vitamin and mineral supplements as recommended by your health care provider. What happens during an annual well check? The services and screenings done by your health care provider during your annual well check will depend on your age, overall health, lifestyle risk factors, and family history of disease. Counseling Your health care provider may ask you questions about your:  Alcohol use.  Tobacco use.  Drug use.  Emotional well-being.  Home and relationship well-being.  Sexual activity.  Eating habits.  Work and work Statistician.  Method of birth control.  Menstrual cycle.  Pregnancy history.  Screening You may have the following tests or measurements:  Height, weight, and BMI.  Diabetes screening. This is done by checking your blood sugar (glucose) after you have not eaten for a while (fasting).  Blood pressure.  Lipid and cholesterol levels. These may be checked every 5 years starting at age 66.  Skin check.  Hepatitis C blood test.  Hepatitis B blood test.  Sexually transmitted disease (STD) testing.  BRCA-related cancer screening. This may be done if you have a family history of breast, ovarian, tubal, or peritoneal cancers.  Pelvic exam and Pap test. This may be done every 3 years starting at age 40. Starting at age 59, this may be done every 5  years if you have a Pap test in combination with an HPV test.  Discuss your test results, treatment options, and if necessary, the need for more tests with your health care provider. Vaccines Your health care provider may recommend certain vaccines, such as:  Influenza vaccine. This is recommended every year.  Tetanus, diphtheria, and acellular pertussis (Tdap, Td) vaccine. You may need a Td booster every 10 years.  Varicella vaccine. You may need this if you have not been vaccinated.  HPV vaccine. If you are 69 or younger, you may need three doses over 6 months.  Measles, mumps, and rubella (MMR) vaccine. You may need at least one dose of MMR. You may also need a second dose.  Pneumococcal 13-valent conjugate (PCV13) vaccine. You may need this if you have certain conditions and were not previously vaccinated.  Pneumococcal polysaccharide (PPSV23) vaccine. You may need one or two doses if you smoke cigarettes or if you have certain conditions.  Meningococcal vaccine. One dose is recommended if you are age 27-21 years and a first-year college student living in a residence hall, or if you have one of several medical conditions. You may also need additional booster doses.  Hepatitis A vaccine. You may need this if you have certain conditions or if you travel or work in places where you may be exposed to hepatitis A.  Hepatitis B vaccine. You may need this if you have certain conditions or if you travel or work in places where you may be exposed to hepatitis B.  Haemophilus influenzae type b (Hib) vaccine. You may need this if  you have certain risk factors.  Talk to your health care provider about which screenings and vaccines you need and how often you need them. This information is not intended to replace advice given to you by your health care provider. Make sure you discuss any questions you have with your health care provider. Document Released: 10/10/2001 Document Revised: 05/03/2016  Document Reviewed: 06/15/2015 Elsevier Interactive Patient Education  2017 [employer].

## 2017-06-11 NOTE — Assessment & Plan Note (Signed)
Discussed diet and exercise Pt has the infor for healthy weight and wellness She will also call her ins to ask about meds covered

## 2017-06-11 NOTE — Progress Notes (Signed)
Subjective:     Brandy Saunders is a 26 y.o. female and is here for a comprehensive physical exam. The patient reports no new problems.  Social History   Social History  . Marital status: Single    Spouse name: n/a  . Number of children: 0  . Years of education: 63   Occupational History  . sales time Forensic scientist    Social History Main Topics  . Smoking status: Former Smoker    Types: Cigarettes  . Smokeless tobacco: Never Used  . Alcohol use 1.8 oz/week    3 Glasses of wine per week  . Drug use: Yes    Types: Marijuana     Comment: once every 6 months  . Sexual activity: Yes    Partners: Male    Birth control/ protection: Condom     Comment: inconsistent condom use   Other Topics Concern  . Not on file   Social History Narrative   Lives with a roommate.     Parents live in [place], [place].   Right handed   Caffeine use: coffee weekly      Health Maintenance  Topic Date Due  . INFLUENZA VACCINE  05/09/2018 (Originally 03/28/2017)  . TETANUS/TDAP  07/30/2018  . PAP SMEAR  04/12/2019  . HIV Screening  Completed    The following portions of the patient's history were reviewed and updated as appropriate:  She  has a past medical history of Asthma; Carpal tunnel syndrome, bilateral; Common migraine with intractable migraine (02/02/2017); Morbidly obese ([place]); and PCOS (polycystic ovarian syndrome). She  does not have any pertinent problems on file. She  has a past surgical history that includes Tonsilectomy, adenoidectomy, bilateral myringotomy and tubes (1994). Her family history includes Diabetes in her maternal uncle; Heart disease in her maternal grandmother; Hyperlipidemia in her mother; Hypertension in her mother; Stroke in her father. She  reports that she has quit smoking. Her smoking use included Cigarettes. She has never used smokeless tobacco. She reports that she drinks about 1.8 oz of alcohol per week . She reports that she uses drugs, including Marijuana. She  has a current medication list which includes the following prescription(s): vitamin c, biotin, blisovi fe 1/20, cyclobenzaprine, fluticasone, levocetirizine, meloxicam, metoprolol succinate, metronidazole, multiple vitamins-calcium, sumatriptan, and metoprolol succinate. Current Outpatient Prescriptions on File Prior to Visit  Medication Sig Dispense Refill  . Ascorbic Acid (VITAMIN C) 1000 MG tablet Take 1,000 mg by mouth daily.    Marland Kitchen BIOTIN 5000 PO Take 10,000 mg by mouth daily.     Marland Kitchen BLISOVI FE 1/20 1-20 MG-MCG tablet TAKE 1 TABLET DAILY 84 tablet 4  . cyclobenzaprine (FLEXERIL) 10 MG tablet Take 1 tablet (10 mg total) by mouth 3 (three) times daily as needed for muscle spasms. 30 tablet 0  . fluticasone (FLONASE) 50 MCG/ACT nasal spray Place 2 sprays into both nostrils daily. (Patient taking differently: Place 2 sprays into both nostrils daily as needed for allergies. ) 16 g 6  . levocetirizine (XYZAL) 5 MG tablet Take 1 tablet (5 mg total) by mouth every evening. (Patient taking differently: Take 5 mg by mouth at bedtime as needed for allergies. ) 30 tablet 5  . meloxicam (MOBIC) 15 MG tablet Take 1 tablet (15 mg total) by mouth daily. 30 tablet 0  . metoprolol succinate (TOPROL-XL) 50 MG 24 hr tablet Take 1 tablet (50 mg total) by mouth daily. Take with or immediately following a meal. 30 tablet 1  . metroNIDAZOLE (METROGEL) 0.75 %  vaginal gel 1 application for 5 nights, then 1 application twice weekly for 4 to 6 months 70 g 3  . Multiple Vitamins-Calcium (ONE-A-DAY WOMENS PO) Take 1 tablet by mouth daily.     . SUMAtriptan (IMITREX) 100 MG tablet Take 1 tablet (100 mg total) by mouth 2 (two) times daily as needed for migraine. 10 tablet 11   No current facility-administered medications on file prior to visit.    She is allergic to ibuprofen and metformin and related..  Review of Systems Review of Systems  Constitutional: Negative for activity change, appetite change and fatigue.  HENT:  Negative for hearing loss, congestion, tinnitus and ear discharge.  dentist q61m Eyes: Negative for visual disturbance (see optho q1y -- vision corrected to 20/20 with glasses).  Respiratory: Negative for cough, chest tightness and shortness of breath.   Cardiovascular: Negative for chest pain, palpitations and leg swelling.  Gastrointestinal: Negative for abdominal pain, diarrhea, constipation and abdominal distention.  Genitourinary: Negative for urgency, frequency, decreased urine volume and difficulty urinating.  Musculoskeletal: Negative for back pain, arthralgias and gait problem.  Skin: Negative for color change, pallor and rash.  Neurological: Negative for dizziness, light-headedness, numbness and headaches.  Hematological: Negative for adenopathy. Does not bruise/bleed easily.  Psychiatric/Behavioral: Negative for suicidal ideas, confusion, sleep disturbance, self-injury, dysphoric mood, decreased concentration and agitation.       Objective:    BP 134/90   Pulse 89   Temp 97.9 F (36.6 C) (Oral)   Ht 5\' 6"  (1.676 m)   Wt (!) 440 lb (199.6 kg)   LMP 05/30/2017   SpO2 90%   BMI 71.02 kg/m  General appearance: alert, cooperative, appears stated age and no distress Head: Normocephalic, without obvious abnormality, atraumatic Eyes: conjunctivae/corneas clear. PERRL, EOM's intact. Fundi benign. Ears: normal TM's and external ear canals both ears Nose: Nares normal. Septum midline. Mucosa normal. No drainage or sinus tenderness. Throat: lips, mucosa, and tongue normal; teeth and gums normal Neck: no adenopathy, no carotid bruit, no JVD, supple, symmetrical, trachea midline and thyroid not enlarged, symmetric, no tenderness/mass/nodules Back: symmetric, no curvature. ROM normal. No CVA tenderness. Lungs: clear to auscultation bilaterally Breasts: gyn Heart: regular rate and rhythm, S1, S2 normal, no murmur, click, rub or gallop Abdomen: soft, non-tender; bowel sounds normal;  no masses,  no organomegaly Pelvic: deferred--gyn Extremities: extremities normal, atraumatic, no cyanosis or edema Pulses: 2+ and symmetric Skin: Skin color, texture, turgor normal. No rashes or lesions Lymph nodes: Cervical, supraclavicular, and axillary nodes normal. Neurologic: Alert and oriented X 3, normal strength and tone. Normal symmetric reflexes. Normal coordination and gait    Assessment:    Healthy female exam.      Plan:    ghm utd Check labs Flu shot refused See After Visit Summary for Counseling Recommendations    1. Morbid obesity ([place]) D/w pt diet and exercise Pt has info for healthy weight and wellness And she will check  - metoprolol succinate (TOPROL-XL) 100 MG 24 hr tablet; Take 1 tablet (100 mg total) by mouth daily. Take with or immediately following a meal.  Dispense: 90 tablet; Refill: 3  2. PCOS (polycystic ovarian syndrome)  - Ambulatory referral to Obstetrics / Gynecology  3. Bacterial vaginal infection Pt requesting gyn referral due to  - Ambulatory referral to Obstetrics / Gynecology - Urine cytology ancillary only  4. Possible exposure to STD  - HIV antibody - RPR - Urine cytology ancillary only  5. Preventative health care See above -  CBC with Differential/Platelet - HIV antibody - Lipid panel - Comprehensive metabolic panel - Hemoglobin A1c - TSH - RPR  6. Other migraine without status migrainosus, not intractable Stable   7. Essential hypertension Poorly controlled will alter medications, encouraged DASH diet, minimize caffeine and obtain adequate sleep. Report concerning symptoms and follow up as directed and as needed

## 2017-06-12 ENCOUNTER — Other Ambulatory Visit (INDEPENDENT_AMBULATORY_CARE_PROVIDER_SITE_OTHER): Payer: Managed Care, Other (non HMO)

## 2017-06-12 DIAGNOSIS — Z0000 Encounter for general adult medical examination without abnormal findings: Secondary | ICD-10-CM | POA: Diagnosis not present

## 2017-06-12 DIAGNOSIS — Z202 Contact with and (suspected) exposure to infections with a predominantly sexual mode of transmission: Secondary | ICD-10-CM

## 2017-06-12 LAB — CBC WITH DIFFERENTIAL/PLATELET
Basophils Absolute: 0 10*3/uL (ref 0.0–0.1)
Basophils Relative: 0.9 % (ref 0.0–3.0)
EOS PCT: 1.7 % (ref 0.0–5.0)
Eosinophils Absolute: 0.1 10*3/uL (ref 0.0–0.7)
HCT: 41.7 % (ref 36.0–46.0)
Hemoglobin: 13.6 g/dL (ref 12.0–15.0)
LYMPHS ABS: 2.1 10*3/uL (ref 0.7–4.0)
Lymphocytes Relative: 42.7 % (ref 12.0–46.0)
MCHC: 32.5 g/dL (ref 30.0–36.0)
MCV: 83.7 fl (ref 78.0–100.0)
MONO ABS: 0.2 10*3/uL (ref 0.1–1.0)
Monocytes Relative: 3.8 % (ref 3.0–12.0)
NEUTROS ABS: 2.5 10*3/uL (ref 1.4–7.7)
NEUTROS PCT: 50.9 % (ref 43.0–77.0)
PLATELETS: 263 10*3/uL (ref 150.0–400.0)
RBC: 4.97 Mil/uL (ref 3.87–5.11)
RDW: 13.8 % (ref 11.5–15.5)
WBC: 4.9 10*3/uL (ref 4.0–10.5)

## 2017-06-12 LAB — COMPREHENSIVE METABOLIC PANEL
ALK PHOS: 44 U/L (ref 39–117)
ALT: 18 U/L (ref 0–35)
AST: 13 U/L (ref 0–37)
Albumin: 3.9 g/dL (ref 3.5–5.2)
BILIRUBIN TOTAL: 0.4 mg/dL (ref 0.2–1.2)
BUN: 10 mg/dL (ref 6–23)
CO2: 26 mEq/L (ref 19–32)
Calcium: 9.2 mg/dL (ref 8.4–10.5)
Chloride: 102 mEq/L (ref 96–112)
Creatinine, Ser: 0.67 mg/dL (ref 0.40–1.20)
GFR: 135.58 mL/min (ref >60.00)
Glucose, Bld: 102 mg/dL — ABNORMAL HIGH (ref 70–99)
POTASSIUM: 4.6 meq/L (ref 3.5–5.1)
SODIUM: 137 meq/L (ref 135–145)
TOTAL PROTEIN: 6.9 g/dL (ref 6.0–8.3)

## 2017-06-12 LAB — LIPID PANEL
CHOLESTEROL: 136 mg/dL (ref 0–200)
HDL: 48 mg/dL (ref >39.00)
LDL Cholesterol: 69 mg/dL (ref 0–99)
NonHDL: 88.25
TRIGLYCERIDES: 98 mg/dL (ref 0.0–149.0)
Total CHOL/HDL Ratio: 3
VLDL: 19.6 mg/dL (ref 0.0–40.0)

## 2017-06-12 LAB — HEMOGLOBIN A1C: Hgb A1c MFr Bld: 5.9 % (ref 4.6–6.5)

## 2017-06-12 LAB — TSH: TSH: 1.98 u[IU]/mL (ref 0.35–4.50)

## 2017-06-13 ENCOUNTER — Telehealth: Payer: Self-pay | Admitting: Family Medicine

## 2017-06-13 LAB — RPR: RPR: NONREACTIVE

## 2017-06-13 LAB — URINE CYTOLOGY ANCILLARY ONLY
CHLAMYDIA, DNA PROBE: NEGATIVE
NEISSERIA GONORRHEA: NEGATIVE
TRICH (WINDOWPATH): NEGATIVE

## 2017-06-13 LAB — HIV ANTIBODY (ROUTINE TESTING W REFLEX): HIV 1&2 Ab, 4th Generation: NONREACTIVE

## 2017-06-13 NOTE — Telephone Encounter (Signed)
Brandy Saunders Self 10-25-1990  Patient called to say that her work is requiring her to have a physiological eval. And she would like for you to put in a referral for this.

## 2017-06-14 ENCOUNTER — Telehealth: Payer: Self-pay | Admitting: *Deleted

## 2017-06-15 ENCOUNTER — Telehealth: Payer: Self-pay

## 2017-06-15 ENCOUNTER — Encounter: Payer: Self-pay | Admitting: Family Medicine

## 2017-06-15 ENCOUNTER — Other Ambulatory Visit: Payer: Self-pay | Admitting: Family Medicine

## 2017-06-15 LAB — URINE CYTOLOGY ANCILLARY ONLY
BACTERIAL VAGINITIS: POSITIVE — AB
Candida vaginitis: NEGATIVE

## 2017-06-15 MED ORDER — METRONIDAZOLE 500 MG PO TABS: 500.0000 mg | ORAL_TABLET | Freq: Two times a day (BID) | ORAL | 0 refills | Status: DC

## 2017-06-15 NOTE — Telephone Encounter (Signed)
-----   Message from Ann Held, DO sent at 06/15/2017  1:13 PM EDT ----- + BV   Flagyl 500 mg bid x 7 days

## 2017-06-15 NOTE — Telephone Encounter (Signed)
Called to inform Pt of lab results. Phone went to VM, message left for PT to call back. Pt pos for BV and flagyl was called into the pharmacy.

## 2017-06-15 NOTE — Telephone Encounter (Signed)
Received FMLA/STD paperwork from [facility] for Concurrent Disability and leave Incapacity for ""Mental Illness" and/or "Behavioral Health"; there is no documentation in pt's chart that PCP has treated and/or referred this patient to either of these requested. Forwarded to provider with these notes & highlights; she will need to complete and/or inform me to forwarded to another provider/SLS 10/19

## 2017-06-19 ENCOUNTER — Other Ambulatory Visit: Payer: Self-pay | Admitting: Family Medicine

## 2017-06-19 DIAGNOSIS — F419 Anxiety disorder, unspecified: Secondary | ICD-10-CM

## 2017-06-22 NOTE — Telephone Encounter (Signed)
Called to inform Pt her she needs to follow up with psych. No answer, left message to call back.

## 2017-06-25 ENCOUNTER — Encounter: Payer: Self-pay | Admitting: Family Medicine

## 2017-06-25 ENCOUNTER — Telehealth: Payer: Self-pay | Admitting: Family Medicine

## 2017-06-25 NOTE — Telephone Encounter (Signed)
Pt stated that received a message from our office to return call, Pt stated received the message on Friday but does not know what for, please call pt at [phone] and ok to leave a complete message since pt will be at work. Please advise. (Did not see any message from Friday)

## 2017-06-25 NOTE — Telephone Encounter (Signed)
Pt states she no longer needs help with FMLA paperwork and is aware of BV. Returned call this morning and

## 2017-06-29 ENCOUNTER — Ambulatory Visit (INDEPENDENT_AMBULATORY_CARE_PROVIDER_SITE_OTHER): Payer: 59 | Admitting: Psychology

## 2017-06-29 DIAGNOSIS — F4323 Adjustment disorder with mixed anxiety and depressed mood: Secondary | ICD-10-CM | POA: Diagnosis not present

## 2017-07-23 ENCOUNTER — Ambulatory Visit (INDEPENDENT_AMBULATORY_CARE_PROVIDER_SITE_OTHER): Payer: Managed Care, Other (non HMO) | Admitting: Obstetrics & Gynecology

## 2017-07-23 ENCOUNTER — Ambulatory Visit (INDEPENDENT_AMBULATORY_CARE_PROVIDER_SITE_OTHER): Payer: 59 | Admitting: Psychology

## 2017-07-23 ENCOUNTER — Encounter: Payer: Self-pay | Admitting: Obstetrics & Gynecology

## 2017-07-23 VITALS — BP 128/57 | HR 76 | Ht 66.0 in | Wt >= 6400 oz

## 2017-07-23 DIAGNOSIS — F4323 Adjustment disorder with mixed anxiety and depressed mood: Secondary | ICD-10-CM | POA: Diagnosis not present

## 2017-07-23 DIAGNOSIS — E282 Polycystic ovarian syndrome: Secondary | ICD-10-CM

## 2017-07-23 DIAGNOSIS — R7303 Prediabetes: Secondary | ICD-10-CM

## 2017-07-23 DIAGNOSIS — Z01419 Encounter for gynecological examination (general) (routine) without abnormal findings: Secondary | ICD-10-CM | POA: Diagnosis not present

## 2017-07-23 NOTE — Progress Notes (Signed)
Subjective:     Brandy Saunders is a 26 y.o. female here for a routine exam. G0  LMP 4 weeks prev. Pt has normal cycles on OCPs. Pt reports irreg cycles when not on OCPs her cycles are every 3-6 months. Pt dx'd with PCOS in 2014- irreg cycles and hirsutism.  Pt is sexually active. No condoms.     Gynecologic History Patient's last menstrual period was 06/25/2017. Contraception: OCP (estrogen/progesterone) x 4 years. EES 1/20 Last Pap: 03/2016. Results were: normal cx: neg Last mammogram: n/a.   Obstetric History OB History  Gravida Para Term Preterm AB Living  0 0 0 0 0 0  SAB TAB Ectopic Multiple Live Births  0 0 0 0 0       The following portions of the patient's history were reviewed and updated as appropriate: allergies, current medications, past family history, past medical history, past social history, past surgical history and problem list.  Review of Systems Pertinent items are noted in HPI.    Objective:  BP (!) 128/57   Pulse 76   Ht 5\' 6"  (1.676 m)   Wt (!) 441 lb (200 kg)   LMP 06/25/2017   BMI 71.18 kg/m   General Appearance:    Alert, cooperative, no distress, appears stated age; obese  Head:    Normocephalic, without obvious abnormality, atraumatic  Eyes:    conjunctiva/corneas clear, EOM's intact, both eyes  Ears:    Normal external ear canals, both ears  Nose:   Nares normal, septum midline, mucosa normal, no drainage    or sinus tenderness  Throat:   Lips, mucosa, and tongue normal; teeth and gums normal  Neck:   Supple, symmetrical, trachea midline, no adenopathy;    thyroid:  no enlargement/tenderness/nodules  Back:     Symmetric, no curvature, ROM normal, no CVA tenderness  Lungs:     Clear to auscultation bilaterally, respirations unlabored  Chest Wall:    No tenderness or deformity   Heart:    Regular rate and rhythm, S1 and S2 normal, no murmur, rub   or gallop  Breast Exam:    No tenderness, masses, or nipple abnormality  Abdomen:     Soft,  non-tender, bowel sounds active all four quadrants,    no masses, no organomegaly  Genitalia:    Normal female without lesion, discharge or tenderness     Extremities:   Extremities normal, atraumatic, no cyanosis or edema  Pulses:   2+ and symmetric all extremities  Skin:   Skin color, texture, turgor normal, no rashes or lesions- there is Acanthosis on her neck and breasts     Assessment:    Healthy female exam.  No PAP indicated today PCOS- preDM Obesity    Plan:    Follow up in: 3 months.    Counseling on Obseity and preDM   rec walking 6x/week. pt encouraged to begin with 64min daily and increase to 1 hour per day  Keep OCPs Reviewed long term risks of PCOS  Kristyna Bradstreet L. Harraway-Smith, M.D., [name]

## 2017-07-23 NOTE — Patient Instructions (Signed)
GO WHITE: Soap: UNSCENTED Dove (white box light green writing) Laundry detergent (underwear)- Dreft or [employer] unscented WHITE 100% cotton panties (NOT just cotton crouch) Sanitary napkin/panty liners: UNSCENTED.  If it doesn't SAY unscented it can have a scent/perfume    NO PERFUMES OR LOTIONS OR POTIONS in the vulvar area (may use regular KY) Condoms: hypoallergenic only. Non dyed (no color) Toilet papers: white only Wash clothes: use a separate wash cloth. WHITE.  Washed in Dreft.  

## 2017-07-25 LAB — CERVICOVAGINAL ANCILLARY ONLY
BACTERIAL VAGINITIS: POSITIVE — AB
Candida vaginitis: NEGATIVE

## 2017-07-26 ENCOUNTER — Other Ambulatory Visit: Payer: Self-pay | Admitting: Obstetrics & Gynecology

## 2017-07-26 MED ORDER — METRONIDAZOLE 0.75 % VA GEL: 1.0000 | Freq: Every day | VAGINAL | 5 refills | Status: DC

## 2017-07-26 MED ORDER — METRONIDAZOLE 500 MG PO TABS: 500.0000 mg | ORAL_TABLET | Freq: Two times a day (BID) | ORAL | 0 refills | Status: DC

## 2017-07-27 ENCOUNTER — Telehealth: Payer: Self-pay

## 2017-07-27 NOTE — Telephone Encounter (Signed)
Patient called made aware that she has bacterial vaginosis and made aware that Dr. Ihor Dow has called in flagyl for her to take and then she would like her to do the metrogel regimen for six month. Kathrene Alu RN

## 2017-08-06 ENCOUNTER — Ambulatory Visit: Payer: 59 | Admitting: Psychology

## 2017-09-03 ENCOUNTER — Ambulatory Visit: Payer: Self-pay | Admitting: Psychology

## 2017-09-11 ENCOUNTER — Ambulatory Visit: Payer: Managed Care, Other (non HMO) | Admitting: Family Medicine

## 2017-09-13 ENCOUNTER — Encounter: Payer: Self-pay | Admitting: Family Medicine

## 2017-09-13 ENCOUNTER — Ambulatory Visit (INDEPENDENT_AMBULATORY_CARE_PROVIDER_SITE_OTHER): Payer: Managed Care, Other (non HMO) | Admitting: Family Medicine

## 2017-09-13 VITALS — BP 134/90 | HR 86 | Temp 97.8°F | Ht 66.0 in | Wt >= 6400 oz

## 2017-09-13 DIAGNOSIS — R739 Hyperglycemia, unspecified: Secondary | ICD-10-CM

## 2017-09-13 DIAGNOSIS — I10 Essential (primary) hypertension: Secondary | ICD-10-CM | POA: Diagnosis not present

## 2017-09-13 LAB — LIPID PANEL
CHOLESTEROL: 127 mg/dL (ref 0–200)
HDL: 45.8 mg/dL (ref >39.00)
LDL CALC: 64 mg/dL (ref 0–99)
NonHDL: 80.7
Total CHOL/HDL Ratio: 3
Triglycerides: 82 mg/dL (ref 0.0–149.0)
VLDL: 16.4 mg/dL (ref 0.0–40.0)

## 2017-09-13 LAB — COMPREHENSIVE METABOLIC PANEL
ALBUMIN: 4.1 g/dL (ref 3.5–5.2)
ALK PHOS: 47 U/L (ref 39–117)
ALT: 15 U/L (ref 0–35)
AST: 14 U/L (ref 0–37)
BUN: 13 mg/dL (ref 6–23)
CHLORIDE: 104 meq/L (ref 96–112)
CO2: 27 mEq/L (ref 19–32)
Calcium: 8.9 mg/dL (ref 8.4–10.5)
Creatinine, Ser: 0.7 mg/dL (ref 0.40–1.20)
GFR: 128.66 mL/min (ref >60.00)
GLUCOSE: 114 mg/dL — AB (ref 70–99)
POTASSIUM: 4.7 meq/L (ref 3.5–5.1)
SODIUM: 138 meq/L (ref 135–145)
TOTAL PROTEIN: 6.9 g/dL (ref 6.0–8.3)
Total Bilirubin: 0.4 mg/dL (ref 0.2–1.2)

## 2017-09-13 LAB — HEMOGLOBIN A1C: HEMOGLOBIN A1C: 6.1 % (ref 4.6–6.5)

## 2017-09-13 MED ORDER — METOPROLOL SUCCINATE ER 50 MG PO TB24: 50.0000 mg | ORAL_TABLET | Freq: Every day | ORAL | 2 refills | Status: DC

## 2017-09-13 MED ORDER — METOPROLOL SUCCINATE ER 50 MG PO TB24: ORAL_TABLET | ORAL | 2 refills | Status: DC

## 2017-09-13 NOTE — Progress Notes (Signed)
Subjective:  I acted as a Education administrator for [employer], [place]   Patient ID: Brandy Saunders, female    DOB: 18-Aug-1991, 27 y.o.   MRN: [id number]  Chief Complaint  Patient presents with  . Follow-up    Pt states that she's followed up with GYN and is here today for BP check    HPI  Patient is in today for f/u bp.  No complaints.   Patient Care Team: Carollee Herter, Alferd Apa, DO as PCP - General (Family Medicine)   Past Medical History:  Diagnosis Date  . Asthma   . Carpal tunnel syndrome, bilateral   . Common migraine with intractable migraine 02/02/2017  . Morbidly obese ([place])   . PCOS (polycystic ovarian syndrome)     Past Surgical History:  Procedure Laterality Date  . TONSILECTOMY, ADENOIDECTOMY, BILATERAL MYRINGOTOMY AND TUBES  1994    Family History  Problem Relation Age of Onset  . Hypertension Mother   . Hyperlipidemia Mother   . Stroke Father   . Diabetes Maternal Uncle   . Heart disease Maternal Grandmother   . Colon cancer Neg Hx     Social History   Socioeconomic History  . Marital status: Single    Spouse name: n/a  . Number of children: 0  . Years of education: 79  . Highest education level: Not on file  Social Needs  . Financial resource strain: Not on file  . Food insecurity - worry: Not on file  . Food insecurity - inability: Not on file  . Transportation needs - medical: Not on file  . Transportation needs - non-medical: Not on file  Occupational History  . Occupation: Public relations account executive  Tobacco Use  . Smoking status: Former Smoker    Types: Cigarettes  . Smokeless tobacco: Never Used  Substance and Sexual Activity  . Alcohol use: Yes    Alcohol/week: 1.8 oz    Types: 3 Glasses of wine per week  . Drug use: No    Comment: once every 6 months, taking CBD  . Sexual activity: Yes    Partners: Male    Birth control/protection: Condom, Pill    Comment: inconsistent condom use  Other Topics Concern  . Not on file  Social History  Narrative   Lives with a roommate.     Parents live in [place], [place].   Right handed   Caffeine use: coffee weekly    Outpatient Medications Prior to Visit  Medication Sig Dispense Refill  . Ascorbic Acid (VITAMIN C) 1000 MG tablet Take 1,000 mg by mouth daily.    Marland Kitchen BIOTIN 5000 PO Take 10,000 mg by mouth daily.     Marland Kitchen BLISOVI FE 1/20 1-20 MG-MCG tablet TAKE 1 TABLET DAILY 84 tablet 4  . cyclobenzaprine (FLEXERIL) 10 MG tablet Take 1 tablet (10 mg total) by mouth 3 (three) times daily as needed for muscle spasms. 30 tablet 0  . fluticasone (FLONASE) 50 MCG/ACT nasal spray Place 2 sprays into both nostrils daily. (Patient taking differently: Place 2 sprays into both nostrils daily as needed for allergies. ) 16 g 6  . levocetirizine (XYZAL) 5 MG tablet Take 1 tablet (5 mg total) by mouth every evening. (Patient taking differently: Take 5 mg by mouth at bedtime as needed for allergies. ) 30 tablet 5  . meloxicam (MOBIC) 15 MG tablet Take 1 tablet (15 mg total) by mouth daily. 30 tablet 0  . metroNIDAZOLE (FLAGYL) 500 MG tablet Take 1 tablet (  500 mg total) by mouth 2 (two) times daily. 14 tablet 0  . metroNIDAZOLE (METROGEL) 0.75 % vaginal gel Place 1 Applicatorful vaginally at bedtime. Apply one applicatorful to vagina at bedtime for 10 days, then twice a week for 6 months. 70 g 5  . Multiple Vitamins-Calcium (ONE-A-DAY WOMENS PO) Take 1 tablet by mouth daily.     . SUMAtriptan (IMITREX) 100 MG tablet Take 1 tablet (100 mg total) by mouth 2 (two) times daily as needed for migraine. 10 tablet 11  . metoprolol succinate (TOPROL-XL) 100 MG 24 hr tablet Take 1 tablet (100 mg total) by mouth daily. Take with or immediately following a meal. 90 tablet 3   No facility-administered medications prior to visit.     Allergies  Allergen Reactions  . Ibuprofen Diarrhea  . Metformin And Related Diarrhea    Review of Systems  Constitutional: Negative for chills, fever and malaise/fatigue.  HENT:  Negative for congestion and hearing loss.   Eyes: Negative for blurred vision and discharge.  Respiratory: Negative for cough, sputum production and shortness of breath.   Cardiovascular: Negative for chest pain, palpitations and leg swelling.  Gastrointestinal: Negative for abdominal pain, blood in stool, constipation, diarrhea, heartburn, nausea and vomiting.  Genitourinary: Negative for dysuria, frequency, hematuria and urgency.  Musculoskeletal: Negative for back pain, falls and myalgias.  Skin: Negative for rash.  Neurological: Negative for dizziness, sensory change, loss of consciousness, weakness and headaches.  Endo/Heme/Allergies: Negative for environmental allergies. Does not bruise/bleed easily.  Psychiatric/Behavioral: Negative for depression and suicidal ideas. The patient is not nervous/anxious and does not have insomnia.        Objective:    Physical Exam  Constitutional: She is oriented to person, place, and time. She appears well-developed and well-nourished.  HENT:  Head: Normocephalic and atraumatic.  Eyes: Conjunctivae and EOM are normal.  Neck: Normal range of motion. Neck supple. No JVD present. Carotid bruit is not present. No thyromegaly present.  Cardiovascular: Normal rate, regular rhythm and normal heart sounds.  No murmur heard. Pulmonary/Chest: Effort normal and breath sounds normal. No respiratory distress. She has no wheezes. She has no rales. She exhibits no tenderness.  Musculoskeletal: She exhibits no edema.  Neurological: She is alert and oriented to person, place, and time.  Psychiatric: She has a normal mood and affect.  Nursing note and vitals reviewed.   BP 134/90   Pulse 86   Temp 97.8 F (36.6 C) (Oral)   Ht 5\' 6"  (1.676 m)   Wt (!) 453 lb (205.5 kg)   SpO2 96%   BMI 73.12 kg/m  Wt Readings from Last 3 Encounters:  09/13/17 (!) 453 lb (205.5 kg)  07/23/17 (!) 441 lb (200 kg)  06/11/17 (!) 440 lb (199.6 kg)   BP Readings from Last 3  Encounters:  09/13/17 134/90  07/23/17 (!) 128/57  06/11/17 134/90     Immunization History  Administered Date(s) Administered  . Td 07/30/2008    Health Maintenance  Topic Date Due  . INFLUENZA VACCINE  05/09/2018 (Originally 03/28/2017)  . TETANUS/TDAP  07/30/2018  . PAP SMEAR  04/12/2019  . HIV Screening  Completed    Lab Results  Component Value Date   WBC 4.9 06/12/2017   HGB 13.6 06/12/2017   HCT 41.7 06/12/2017   PLT 263.0 06/12/2017   GLUCOSE 102 (H) 06/12/2017   CHOL 136 06/12/2017   TRIG 98.0 06/12/2017   HDL 48.00 06/12/2017   LDLCALC 69 06/12/2017   ALT 18  06/12/2017   AST 13 06/12/2017   NA 137 06/12/2017   K 4.6 06/12/2017   CL 102 06/12/2017   CREATININE 0.67 06/12/2017   BUN 10 06/12/2017   CO2 26 06/12/2017   TSH 1.98 06/12/2017   HGBA1C 5.9 06/12/2017    Lab Results  Component Value Date   TSH 1.98 06/12/2017   Lab Results  Component Value Date   WBC 4.9 06/12/2017   HGB 13.6 06/12/2017   HCT 41.7 06/12/2017   MCV 83.7 06/12/2017   PLT 263.0 06/12/2017   Lab Results  Component Value Date   NA 137 06/12/2017   K 4.6 06/12/2017   CO2 26 06/12/2017   GLUCOSE 102 (H) 06/12/2017   BUN 10 06/12/2017   CREATININE 0.67 06/12/2017   BILITOT 0.4 06/12/2017   ALKPHOS 44 06/12/2017   AST 13 06/12/2017   ALT 18 06/12/2017   PROT 6.9 06/12/2017   ALBUMIN 3.9 06/12/2017   CALCIUM 9.2 06/12/2017   ANIONGAP 9 02/22/2017   GFR 135.58 06/12/2017   Lab Results  Component Value Date   CHOL 136 06/12/2017   Lab Results  Component Value Date   HDL 48.00 06/12/2017   Lab Results  Component Value Date   LDLCALC 69 06/12/2017   Lab Results  Component Value Date   TRIG 98.0 06/12/2017   Lab Results  Component Value Date   CHOLHDL 3 06/12/2017   Lab Results  Component Value Date   HGBA1C 5.9 06/12/2017         Assessment & Plan:   Problem List Items Addressed This Visit      Unprioritized   Essential hypertension - Primary     Poorly controlled will alter medications, encouraged DASH diet, minimize caffeine and obtain adequate sleep. Report concerning symptoms and follow up as directed and as needed      Relevant Medications   metoprolol succinate (TOPROL XL) 50 MG 24 hr tablet   Other Relevant Orders   Lipid panel   Hemoglobin A1c   Comprehensive metabolic panel   Morbid obesity ([facility])    Discussed diet and exercise Pt has gained more weight since Nov Pt given number for healthy weight and wellness        Other Visit Diagnoses    Hyperglycemia       Relevant Orders   Hemoglobin A1c   Comprehensive metabolic panel      I have discontinued Shawnta L. Campbell's metoprolol succinate. I have also changed her metoprolol succinate. Additionally, I am having her maintain her Multiple Vitamins-Calcium (ONE-A-DAY WOMENS PO), BIOTIN 5000 PO, levocetirizine, fluticasone, vitamin C, BLISOVI FE 1/20, cyclobenzaprine, SUMAtriptan, meloxicam, metroNIDAZOLE, and metroNIDAZOLE.  Meds ordered this encounter  Medications  . DISCONTD: metoprolol succinate (TOPROL XL) 50 MG 24 hr tablet    Sig: Take 1 tablet (50 mg total) by mouth daily. Take with or immediately following a meal.    Dispense:  270 tablet    Refill:  2  . metoprolol succinate (TOPROL XL) 50 MG 24 hr tablet    Sig: Take 3 tab po qd  with or immediately following a meal.    Dispense:  270 tablet    Refill:  2    CMA served as scribe during this visit. History, Physical and Plan performed by medical provider. Documentation and orders reviewed and attested to.  Ann Held, DO

## 2017-09-13 NOTE — Patient Instructions (Signed)
DASH Eating Plan DASH stands for "Dietary Approaches to Stop Hypertension." The DASH eating plan is a healthy eating plan that has been shown to reduce high blood pressure (hypertension). It may also reduce your risk for type 2 diabetes, heart disease, and stroke. The DASH eating plan may also help with weight loss. What are tips for following this plan? General guidelines  Avoid eating more than 2,300 mg (milligrams) of salt (sodium) a day. If you have hypertension, you may need to reduce your sodium intake to 1,500 mg a day.  Limit alcohol intake to no more than 1 drink a day for nonpregnant women and 2 drinks a day for men. One drink equals 12 oz of beer, 5 oz of wine, or 1 oz of hard liquor.  Work with your health care provider to maintain a healthy body weight or to lose weight. Ask what an ideal weight is for you.  Get at least 30 minutes of exercise that causes your heart to beat faster (aerobic exercise) most days of the week. Activities may include walking, swimming, or biking.  Work with your health care provider or diet and nutrition specialist (dietitian) to adjust your eating plan to your individual calorie needs. Reading food labels  Check food labels for the amount of sodium per serving. Choose foods with less than 5 percent of the Daily Value of sodium. Generally, foods with less than 300 mg of sodium per serving fit into this eating plan.  To find whole grains, look for the word "whole" as the first word in the ingredient list. Shopping  Buy products labeled as "low-sodium" or "no salt added."  Buy fresh foods. Avoid canned foods and premade or frozen meals. Cooking  Avoid adding salt when cooking. Use salt-free seasonings or herbs instead of table salt or sea salt. Check with your health care provider or pharmacist before using salt substitutes.  Do not fry foods. Cook foods using healthy methods such as baking, boiling, grilling, and broiling instead.  Cook with  heart-healthy oils, such as olive, canola, soybean, or sunflower oil. Meal planning   Eat a balanced diet that includes: ? 5 or more servings of fruits and vegetables each day. At each meal, try to fill half of your plate with fruits and vegetables. ? Up to 6-8 servings of whole grains each day. ? Less than 6 oz of lean meat, poultry, or fish each day. A 3-oz serving of meat is about the same size as a deck of cards. One egg equals 1 oz. ? 2 servings of low-fat dairy each day. ? A serving of nuts, seeds, or beans 5 times each week. ? Heart-healthy fats. Healthy fats called Omega-3 fatty acids are found in foods such as flaxseeds and coldwater fish, like sardines, salmon, and mackerel.  Limit how much you eat of the following: ? Canned or prepackaged foods. ? Food that is high in trans fat, such as fried foods. ? Food that is high in saturated fat, such as fatty meat. ? Sweets, desserts, sugary drinks, and other foods with added sugar. ? Full-fat dairy products.  Do not salt foods before eating.  Try to eat at least 2 vegetarian meals each week.  Eat more home-cooked food and less restaurant, buffet, and fast food.  When eating at a restaurant, ask that your food be prepared with less salt or no salt, if possible. What foods are recommended? The items listed may not be a complete list. Talk with your dietitian about what   dietary choices are best for you. Grains Whole-grain or whole-wheat bread. Whole-grain or whole-wheat pasta. Brown rice. Oatmeal. Quinoa. Bulgur. Whole-grain and low-sodium cereals. Pita bread. Low-fat, low-sodium crackers. Whole-wheat flour tortillas. Vegetables Fresh or frozen vegetables (raw, steamed, roasted, or grilled). Low-sodium or reduced-sodium tomato and vegetable juice. Low-sodium or reduced-sodium tomato sauce and tomato paste. Low-sodium or reduced-sodium canned vegetables. Fruits All fresh, dried, or frozen fruit. Canned fruit in natural juice (without  added sugar). Meat and other protein foods Skinless chicken or turkey. Ground chicken or turkey. Pork with fat trimmed off. Fish and seafood. Egg whites. Dried beans, peas, or lentils. Unsalted nuts, nut butters, and seeds. Unsalted canned beans. Lean cuts of beef with fat trimmed off. Low-sodium, lean deli meat. Dairy Low-fat (1%) or fat-free (skim) milk. Fat-free, low-fat, or reduced-fat cheeses. Nonfat, low-sodium ricotta or cottage cheese. Low-fat or nonfat yogurt. Low-fat, low-sodium cheese. Fats and oils Soft margarine without trans fats. Vegetable oil. Low-fat, reduced-fat, or light mayonnaise and salad dressings (reduced-sodium). Canola, safflower, olive, soybean, and sunflower oils. Avocado. Seasoning and other foods Herbs. Spices. Seasoning mixes without salt. Unsalted popcorn and pretzels. Fat-free sweets. What foods are not recommended? The items listed may not be a complete list. Talk with your dietitian about what dietary choices are best for you. Grains Baked goods made with fat, such as croissants, muffins, or some breads. Dry pasta or rice meal packs. Vegetables Creamed or fried vegetables. Vegetables in a cheese sauce. Regular canned vegetables (not low-sodium or reduced-sodium). Regular canned tomato sauce and paste (not low-sodium or reduced-sodium). Regular tomato and vegetable juice (not low-sodium or reduced-sodium). Pickles. Olives. Fruits Canned fruit in a light or heavy syrup. Fried fruit. Fruit in cream or butter sauce. Meat and other protein foods Fatty cuts of meat. Ribs. Fried meat. Bacon. Sausage. Bologna and other processed lunch meats. Salami. Fatback. Hotdogs. Bratwurst. Salted nuts and seeds. Canned beans with added salt. Canned or smoked fish. Whole eggs or egg yolks. Chicken or turkey with skin. Dairy Whole or 2% milk, cream, and half-and-half. Whole or full-fat cream cheese. Whole-fat or sweetened yogurt. Full-fat cheese. Nondairy creamers. Whipped toppings.  Processed cheese and cheese spreads. Fats and oils Butter. Stick margarine. Lard. Shortening. Ghee. Bacon fat. Tropical oils, such as coconut, palm kernel, or palm oil. Seasoning and other foods Salted popcorn and pretzels. Onion salt, garlic salt, seasoned salt, table salt, and sea salt. Worcestershire sauce. Tartar sauce. Barbecue sauce. Teriyaki sauce. Soy sauce, including reduced-sodium. Steak sauce. Canned and packaged gravies. Fish sauce. Oyster sauce. Cocktail sauce. Horseradish that you find on the shelf. Ketchup. Mustard. Meat flavorings and tenderizers. Bouillon cubes. Hot sauce and Tabasco sauce. Premade or packaged marinades. Premade or packaged taco seasonings. Relishes. Regular salad dressings. Where to find more information:  National Heart, Lung, and Blood Institute: www.nhlbi.nih.gov  American Heart Association: www.heart.org Summary  The DASH eating plan is a healthy eating plan that has been shown to reduce high blood pressure (hypertension). It may also reduce your risk for type 2 diabetes, heart disease, and stroke.  With the DASH eating plan, you should limit salt (sodium) intake to 2,300 mg a day. If you have hypertension, you may need to reduce your sodium intake to 1,500 mg a day.  When on the DASH eating plan, aim to eat more fresh fruits and vegetables, whole grains, lean proteins, low-fat dairy, and heart-healthy fats.  Work with your health care provider or diet and nutrition specialist (dietitian) to adjust your eating plan to your individual   calorie needs. This information is not intended to replace advice given to you by your health care provider. Make sure you discuss any questions you have with your health care provider. Document Released: 08/03/2011 Document Revised: 08/07/2016 Document Reviewed: 08/07/2016 Elsevier Interactive Patient Education  2018 [employer]  

## 2017-09-13 NOTE — Assessment & Plan Note (Signed)
Poorly controlled will alter medications, encouraged DASH diet, minimize caffeine and obtain adequate sleep. Report concerning symptoms and follow up as directed and as needed 

## 2017-09-13 NOTE — Assessment & Plan Note (Signed)
Discussed diet and exercise Pt has gained more weight since Nov Pt given number for healthy weight and wellness

## 2017-09-19 ENCOUNTER — Telehealth: Payer: Self-pay

## 2017-09-19 ENCOUNTER — Ambulatory Visit (INDEPENDENT_AMBULATORY_CARE_PROVIDER_SITE_OTHER): Payer: Managed Care, Other (non HMO) | Admitting: Internal Medicine

## 2017-09-19 ENCOUNTER — Encounter: Payer: Self-pay | Admitting: Internal Medicine

## 2017-09-19 VITALS — BP 124/80 | HR 79 | Temp 97.7°F | Resp 14 | Ht 66.0 in | Wt >= 6400 oz

## 2017-09-19 DIAGNOSIS — H02846 Edema of left eye, unspecified eyelid: Secondary | ICD-10-CM | POA: Diagnosis not present

## 2017-09-19 MED ORDER — DOXYCYCLINE HYCLATE 100 MG PO TABS: 100.0000 mg | ORAL_TABLET | Freq: Two times a day (BID) | ORAL | 0 refills | Status: DC

## 2017-09-19 MED ORDER — GENTAMICIN SULFATE 0.3 % OP OINT: TOPICAL_OINTMENT | Freq: Two times a day (BID) | OPHTHALMIC | 0 refills | Status: DC

## 2017-09-19 NOTE — Telephone Encounter (Signed)
Talked to patient, this was about her lab results.

## 2017-09-19 NOTE — Progress Notes (Signed)
Pre visit review using our clinic review tool, if applicable. No additional management support is needed unless otherwise documented below in the visit note. 

## 2017-09-19 NOTE — Patient Instructions (Signed)
Warm compresses  Doxycycline by mouth times 5 days  Garamycin ointment to the left eye twice a day  Call immediately if you get much worse or if you are not improving in the next 2-3 days

## 2017-09-19 NOTE — Progress Notes (Signed)
Subjective:    Patient ID: Brandy Saunders, female    DOB: 08-22-1991, 27 y.o.   MRN: [id number]  DOS:  09/19/2017 Type of visit - description : acute Interval history: Noted her left upper eyelid to be slightly painful 3 days ago, 2 days ago started to notice swelling. She called a telemedicine doctor yesterday, prescribed cephalexin po , took 2 doses, give her a headache.  Not taking that anymore   Review of Systems Denies photophobia. Has not seen any eye discharge No eye redness Moderate blurred vision left eye?  Past Medical History:  Diagnosis Date  . Asthma   . Carpal tunnel syndrome, bilateral   . Common migraine with intractable migraine 02/02/2017  . Morbidly obese ([place])   . PCOS (polycystic ovarian syndrome)     Past Surgical History:  Procedure Laterality Date  . TONSILECTOMY, ADENOIDECTOMY, BILATERAL MYRINGOTOMY AND TUBES  1994    Social History   Socioeconomic History  . Marital status: Single    Spouse name: n/a  . Number of children: 0  . Years of education: 71  . Highest education level: Not on file  Social Needs  . Financial resource strain: Not on file  . Food insecurity - worry: Not on file  . Food insecurity - inability: Not on file  . Transportation needs - medical: Not on file  . Transportation needs - non-medical: Not on file  Occupational History  . Occupation: Public relations account executive  Tobacco Use  . Smoking status: Former Smoker    Types: Cigarettes  . Smokeless tobacco: Never Used  Substance and Sexual Activity  . Alcohol use: Yes    Alcohol/week: 1.8 oz    Types: 3 Glasses of wine per week  . Drug use: No    Comment: once every 6 months, taking CBD  . Sexual activity: Yes    Partners: Male    Birth control/protection: Condom, Pill    Comment: inconsistent condom use  Other Topics Concern  . Not on file  Social History Narrative   Lives with a roommate.     Parents live in [place], [place].   Right handed   Caffeine use: coffee  weekly      Allergies as of 09/19/2017      Reactions   Ibuprofen Diarrhea   Metformin And Related Diarrhea      Medication List        Accurate as of 09/19/17 11:59 PM. Always use your most recent med list.          BIOTIN 5000 PO Take 10,000 mg by mouth daily.   BLISOVI FE 1/20 1-20 MG-MCG tablet Generic drug:  norethindrone-ethinyl estradiol TAKE 1 TABLET DAILY   cyclobenzaprine 10 MG tablet Commonly known as:  FLEXERIL Take 1 tablet (10 mg total) by mouth 3 (three) times daily as needed for muscle spasms.   doxycycline 100 MG tablet Commonly known as:  VIBRA-TABS Take 1 tablet (100 mg total) by mouth 2 (two) times daily.   fluticasone 50 MCG/ACT nasal spray Commonly known as:  FLONASE Place 2 sprays into both nostrils daily.   gentamicin 0.3 % ophthalmic ointment Commonly known as:  GARAMYCIN Place into the left eye 2 (two) times daily.   levocetirizine 5 MG tablet Commonly known as:  XYZAL Take 1 tablet (5 mg total) by mouth every evening.   meloxicam 15 MG tablet Commonly known as:  MOBIC Take 1 tablet (15 mg total) by mouth daily.   metoprolol succinate 50  MG 24 hr tablet Commonly known as:  TOPROL XL Take 3 tab po qd  with or immediately following a meal.   metroNIDAZOLE 0.75 % vaginal gel Commonly known as:  METROGEL Place 1 Applicatorful vaginally at bedtime. Apply one applicatorful to vagina at bedtime for 10 days, then twice a week for 6 months.   ONE-A-DAY WOMENS PO Take 1 tablet by mouth daily.   SUMAtriptan 100 MG tablet Commonly known as:  IMITREX Take 1 tablet (100 mg total) by mouth 2 (two) times daily as needed for migraine.   vitamin C 1000 MG tablet Take 1,000 mg by mouth daily.          Objective:   Physical Exam  Eyes:     BP 124/80 (BP Location: Right Wrist, Patient Position: Sitting, Cuff Size: Small)   Pulse 79   Temp 97.7 F (36.5 C) (Oral)   Resp 14   Ht 5\' 6"  (1.676 m)   Wt (!) 443 lb (200.9 kg)   LMP  08/20/2017 (Approximate)   SpO2 96%   BMI 71.50 kg/m  General:   Well developed, well nourished . NAD.  HEENT:  Normocephalic . Face symmetric, atraumatic. EOMI, pupils equal and reactive, conjunctiva is normal without redness.  Anterior chambers normal. Skin: Not pale. Not jaundice Neurologic:  alert & oriented X3.  Speech normal, gait appropriate for age and unassisted Psych--  Cognition and judgment appear intact.  Cooperative with normal attention span and concentration.  Behavior appropriate. No anxious or depressed appearing.      Assessment & Plan:   27 year old lady with multiple problems including fatty liver, HTN, migraine headaches, morbid obesity, PCO S presents w/:  Left upper eyelid inflammation: The patient reports moderate blurred vision, eye exam is however normal except for the left upper eyelid swelling. Has possibly early bacterial infection. Plan: Stop cephalexin is causing her a headache.  Start doxycycline times 5 days and Garamycin topical.  Quickly report if she is not improving.

## 2017-09-21 MED ORDER — LORCASERIN HCL ER 20 MG PO TB24: 20.0000 mg | ORAL_TABLET | ORAL | 0 refills | Status: AC

## 2017-09-21 NOTE — Addendum Note (Signed)
Addended by: Jiles Prows on: 09/21/2017 09:33 AM   Modules accepted: Orders

## 2017-10-04 ENCOUNTER — Encounter (INDEPENDENT_AMBULATORY_CARE_PROVIDER_SITE_OTHER): Payer: Managed Care, Other (non HMO)

## 2017-10-09 ENCOUNTER — Ambulatory Visit (INDEPENDENT_AMBULATORY_CARE_PROVIDER_SITE_OTHER): Payer: Managed Care, Other (non HMO) | Admitting: Family Medicine

## 2017-10-11 ENCOUNTER — Ambulatory Visit (INDEPENDENT_AMBULATORY_CARE_PROVIDER_SITE_OTHER): Payer: Managed Care, Other (non HMO) | Admitting: Family Medicine

## 2017-10-11 ENCOUNTER — Encounter (INDEPENDENT_AMBULATORY_CARE_PROVIDER_SITE_OTHER): Payer: Self-pay | Admitting: Family Medicine

## 2017-10-11 VITALS — BP 139/78 | HR 83 | Temp 98.3°F | Ht 66.0 in | Wt >= 6400 oz

## 2017-10-11 DIAGNOSIS — R0602 Shortness of breath: Secondary | ICD-10-CM | POA: Diagnosis not present

## 2017-10-11 DIAGNOSIS — I10 Essential (primary) hypertension: Secondary | ICD-10-CM | POA: Diagnosis not present

## 2017-10-11 DIAGNOSIS — R7303 Prediabetes: Secondary | ICD-10-CM | POA: Insufficient documentation

## 2017-10-11 DIAGNOSIS — Z0289 Encounter for other administrative examinations: Secondary | ICD-10-CM

## 2017-10-11 DIAGNOSIS — R5383 Other fatigue: Secondary | ICD-10-CM | POA: Diagnosis not present

## 2017-10-11 DIAGNOSIS — Z9189 Other specified personal risk factors, not elsewhere classified: Secondary | ICD-10-CM | POA: Diagnosis not present

## 2017-10-11 DIAGNOSIS — Z6841 Body Mass Index (BMI) 40.0 and over, adult: Secondary | ICD-10-CM | POA: Diagnosis not present

## 2017-10-11 DIAGNOSIS — Z1331 Encounter for screening for depression: Secondary | ICD-10-CM | POA: Diagnosis not present

## 2017-10-11 NOTE — Progress Notes (Signed)
Office: [phone]  /  Fax: [phone]   Dear Dr. Carollee Herter,   Thank you for telling Brandy Saunders about our clinic. The following note includes my evaluation and treatment recommendations.  HPI:   Chief Complaint: OBESITY    Brandy Saunders was informed about clinic by Ann Held, DO for consultation regarding her obesity and obesity related comorbidities.    Brandy Saunders (MR# [id number]) is a 27 y.o. female who presents on 10/11/2017 for obesity evaluation and treatment. Current BMI is Body mass index is 72.15 kg/m.Brandy Saunders has been struggling with her weight for many years and has been unsuccessful in either losing weight, maintaining weight loss, or reaching her healthy weight goal.     Brandy Saunders attended our information session and states she is currently in the action stage of change and ready to dedicate time achieving and maintaining a healthier weight. Brandy Saunders is interested in becoming our patient and working on intensive lifestyle modifications including (but not limited to) diet, exercise and weight loss.    Brandy Saunders states she struggles with family and or coworkers weight loss sabotage her desired weight loss is 200 lbs she has been heavy most of  her life she started gaining weight at birth her heaviest weight ever was 450 lbs. she has significant food cravings issues  she snacks frequently in the evenings she skips meals frequently she is frequently drinking liquids with calories she frequently makes poor food choices she frequently eats larger portions than normal  she has binge eating behaviors she struggles with emotional eating    Brandy Saunders feels her energy is lower than it should be. This has worsened with weight gain and has not worsened recently. Brandy Saunders admits to daytime somnolence and  admits to waking up still tired. Patient is at risk for obstructive sleep apnea. Patent has a history of symptoms of daytime Brandy,  morning Brandy, morning headache and hypertension. Patient generally gets 7 to 9 hours of sleep per night, and states they generally have restful sleep. Snoring is present. Apneic episodes are present. Epworth Sleepiness Score is 13 EKG is within normal limits.  Dyspnea on exertion Brandy Saunders notes increasing shortness of breath with exercising and seems to be worsening over time with weight gain. She notes getting out of breath sooner with activity than she used to. This has not gotten worse recently. EKG is within normal limits.  Pre-Diabetes Brandy Saunders has a diagnosis of pre-diabetes based on her elevated Hgb A1c at 6.1 and was informed this puts her at greater risk of developing diabetes. She did not tolerate metformin in the past. She is attempting to work on diet and exercise to decrease risk of diabetes. She denies polyuria, polydipsia or hypoglycemia.  At risk for diabetes Brandy Saunders is at higher than average risk for developing diabetes due to her obesity and pre-diabetes. She currently denies polyuria or polydipsia.  Hypertension Brandy Saunders is a 27 y.o. female with hypertension. Brandy Saunders denies chest pain, chest pressure or headache. She is working weight loss to help control her blood pressure with the goal of decreasing her risk of heart attack and stroke. Brandy Saunders blood pressure is well controlled today.  Depression Screen Brandy Saunders's Food and Mood (modified PHQ-9) score was  Depression screen PHQ 2/9 10/11/2017  Decreased Interest 3  Down, Depressed, Hopeless 3  PHQ - 2 Score 6  Altered sleeping 2  Tired, decreased energy 3  Change in appetite 2  Feeling bad or failure about yourself  3  Trouble concentrating 3  Moving slowly or fidgety/restless 2  Suicidal thoughts 2  PHQ-9 Score 23  Difficult doing work/chores Extremely dIfficult    ALLERGIES: Allergies  Allergen Reactions  . Ibuprofen Diarrhea  . Metformin And Related Diarrhea     MEDICATIONS: Current Outpatient Medications on File Prior to Visit  Medication Sig Dispense Refill  . acetaminophen (TYLENOL) 500 MG tablet Take 500 mg by mouth every 6 (six) hours as needed.    . Ascorbic Acid (VITAMIN C) 1000 MG tablet Take 1,000 mg by mouth daily.    Marland Kitchen BIOTIN 5000 PO Take 10,000 mg by mouth daily.     Marland Kitchen BLISOVI FE 1/20 1-20 MG-MCG tablet TAKE 1 TABLET DAILY 84 tablet 4  . cyanocobalamin 1000 MCG tablet Take 2,500 mcg by mouth daily.    . meloxicam (MOBIC) 15 MG tablet Take 1 tablet (15 mg total) by mouth daily. 30 tablet 0  . metoprolol succinate (TOPROL XL) 50 MG 24 hr tablet Take 3 tab po qd  with or immediately following a meal. 270 tablet 2  . Multiple Vitamins-Calcium (ONE-A-DAY WOMENS PO) Take 1 tablet by mouth daily.     . SUMAtriptan (IMITREX) 100 MG tablet Take 1 tablet (100 mg total) by mouth 2 (two) times daily as needed for migraine. 10 tablet 11   No current facility-administered medications on file prior to visit.     PAST MEDICAL HISTORY: Past Medical History:  Diagnosis Date  . Anxiety   . Asthma   . Back pain   . Binge eating   . Carpal tunnel syndrome, bilateral   . Chest pain   . Common migraine with intractable migraine 02/02/2017  . Constipation   . Depression   . Dyspnea   . Fatty liver   . GERD (gastroesophageal reflux disease)   . Hypertension   . Joint pain   . Leg edema   . Morbidly obese ([place])   . Palpitations   . PCOS (polycystic ovarian syndrome)   . Prediabetes   . Vitamin D deficiency     PAST SURGICAL HISTORY: Past Surgical History:  Procedure Laterality Date  . TONSILECTOMY, ADENOIDECTOMY, BILATERAL MYRINGOTOMY AND TUBES  1994    SOCIAL HISTORY: Social History   Tobacco Use  . Smoking status: Former Smoker    Types: Cigarettes  . Smokeless tobacco: Never Used  Substance Use Topics  . Alcohol use: Yes    Alcohol/week: 1.8 oz    Types: 3 Glasses of wine per week  . Drug use: No    Comment: once every 6  months, taking CBD    FAMILY HISTORY: Family History  Problem Relation Age of Onset  . Hypertension Mother   . Hyperlipidemia Mother   . Obesity Mother   . Stroke Father   . Obesity Father   . Alcoholism Father   . Drug abuse Father   . Diabetes Maternal Uncle   . Heart disease Maternal Grandmother   . Colon cancer Neg Hx     ROS: Review of Systems  Constitutional: Positive for malaise/Brandy.       Fever/Chills  HENT: Positive for tinnitus.        Dry Mouth  Eyes:       Vision Changes Blurry or Double Vision  Respiratory: Positive for shortness of breath (on exertion).        Painful or Difficulty Breathing  Cardiovascular: Positive for orthopnea. Negative for chest pain.       Sudden Awakening  from Sleep with Shortness of Breath Negative for chest pressure  Gastrointestinal: Positive for constipation, diarrhea, heartburn and nausea.  Genitourinary: Negative for frequency.  Musculoskeletal:       Neck Stiffness  Skin: Positive for itching.       Dryness   Neurological: Positive for weakness and headaches.  Endo/Heme/Allergies: Negative for polydipsia.       Negative for hypoglycemia  Psychiatric/Behavioral: Positive for depression. The patient is nervous/anxious (nervousness) and has insomnia.        Stress    PHYSICAL EXAM: Blood pressure 139/78, pulse 83, temperature 98.3 F (36.8 C), temperature source Oral, height 5\' 6"  (1.676 m), weight (!) 447 lb (202.8 kg), last menstrual period 09/16/2017, SpO2 99 %. Body mass index is 72.15 kg/m. Physical Exam  Constitutional: She is oriented to person, place, and time. She appears well-developed and well-nourished.  HENT:  Head: Normocephalic and atraumatic.  Nose: Nose normal.  Eyes: EOM are normal. No scleral icterus.  Neck: Normal range of motion. Neck supple. No thyromegaly present.  Cardiovascular: Normal rate and regular rhythm.  Pulmonary/Chest: Effort normal. No respiratory distress.  Abdominal: Soft.  There is no tenderness.  + obesity  Musculoskeletal: Normal range of motion. She exhibits edema (3+ bilateral lower extremities).  Range of Motion normal in all 4 extremities  Neurological: She is alert and oriented to person, place, and time. Coordination normal.  Skin: Skin is warm and dry.  Positive for acanthosis nigricans  Psychiatric: She has a normal mood and affect. Her behavior is normal.  Vitals reviewed.   RECENT LABS AND TESTS: BMET    Component Value Date/Time   NA 138 09/13/2017 0929   K 4.7 09/13/2017 0929   CL 104 09/13/2017 0929   CO2 27 09/13/2017 0929   GLUCOSE 114 (H) 09/13/2017 0929   BUN 13 09/13/2017 0929   CREATININE 0.70 09/13/2017 0929   CREATININE 0.67 09/28/2014 1146   CALCIUM 8.9 09/13/2017 0929   GFRNONAA >60 02/22/2017 2044   GFRAA >60 02/22/2017 2044   Lab Results  Component Value Date   HGBA1C 6.1 09/13/2017   No results found for: INSULIN CBC    Component Value Date/Time   WBC 4.9 06/12/2017 1344   RBC 4.97 06/12/2017 1344   HGB 13.6 06/12/2017 1344   HCT 41.7 06/12/2017 1344   PLT 263.0 06/12/2017 1344   MCV 83.7 06/12/2017 1344   MCV 82.2 11/25/2012 2107   MCH 24.5 (L) 09/28/2014 1146   MCHC 32.5 06/12/2017 1344   RDW 13.8 06/12/2017 1344   LYMPHSABS 2.1 06/12/2017 1344   MONOABS 0.2 06/12/2017 1344   EOSABS 0.1 06/12/2017 1344   BASOSABS 0.0 06/12/2017 1344   Iron/TIBC/Ferritin/ %Sat No results found for: IRON, TIBC, FERRITIN, IRONPCTSAT Lipid Panel     Component Value Date/Time   CHOL 127 09/13/2017 0929   TRIG 82.0 09/13/2017 0929   HDL 45.80 09/13/2017 0929   CHOLHDL 3 09/13/2017 0929   VLDL 16.4 09/13/2017 0929   LDLCALC 64 09/13/2017 0929   Hepatic Function Panel     Component Value Date/Time   PROT 6.9 09/13/2017 0929   ALBUMIN 4.1 09/13/2017 0929   AST 14 09/13/2017 0929   ALT 15 09/13/2017 0929   ALKPHOS 47 09/13/2017 0929   BILITOT 0.4 09/13/2017 0929   BILIDIR 0.1 09/28/2014 1146   IBILI 0.4  09/28/2014 1146      Component Value Date/Time   TSH 1.98 06/12/2017 1344   TSH 1.49 04/11/2016 1627   TSH  2.147 09/28/2014 1146    ECG  shows NSR with a rate of 84 BPM INDIRECT CALORIMETER done today shows a VO2 of 298 and a REE of 2072.  Her calculated basal metabolic rate is 9833 thus her basal metabolic rate is worse than expected.    ASSESSMENT AND PLAN: Other Brandy - Plan: EKG 12-Lead, CBC With Differential, Vitamin B12, Folate, Lipid Panel With LDL/HDL Ratio, T3, T4, free, TSH, VITAMIN D 25 Hydroxy (Vit-D Deficiency, Fractures)  Shortness of breath on exertion  Prediabetes - Plan: Comprehensive metabolic panel, Hemoglobin A1c, Insulin, random  Essential hypertension  Depression screening  At risk for diabetes mellitus  Class 3 severe obesity with serious comorbidity and body mass index (BMI) greater than or equal to 70 in adult, unspecified obesity type (HCC)  PLAN: Brandy Brandy Saunders was informed that her Brandy may be related to obesity, depression or many other causes. Labs will be ordered, and in the meanwhile Brandy Saunders has agreed to work on diet, exercise and weight loss to help with Brandy. Proper sleep hygiene was discussed including the need for 7-8 hours of quality sleep each night. A sleep study was not ordered based on symptoms and Epworth score. We will order EKG and indirect calorimetry and Brandy Saunders agrees to follow up as directed.  Dyspnea on exertion Brandy Saunders's shortness of breath appears to be obesity related and exercise induced. She has agreed to work on weight loss and gradually increase exercise to treat her exercise induced shortness of breath. If Brandy Saunders follows our instructions and loses weight without improvement of her shortness of breath, we will plan to refer to pulmonology. We will order labs, EKG and indirect calorimetry. We will monitor this condition regularly. Nya agrees to this plan.  Pre-Diabetes Brandy Saunders will continue to work on  weight loss, exercise, and decreasing simple carbohydrates in her diet to help decrease the risk of diabetes. She was informed that eating too many simple carbohydrates or too many calories at one sitting increases the likelihood of GI side effects. We will check insulin level today and Brandy Saunders agreed to follow up with [place] as directed to monitor her progress.  Diabetes risk counseling Ikia was given extended (15 minutes) diabetes prevention counseling today. She is 27 y.o. female and has risk factors for diabetes including obesity and pre-diabetes. We discussed intensive lifestyle modifications today with an emphasis on weight loss as well as increasing exercise and decreasing simple carbohydrates in her diet.  Hypertension We discussed sodium restriction, working on healthy weight loss, and a regular exercise program as the means to achieve improved blood pressure control. Shaquoya agreed with this plan and agreed to follow up as directed. We will continue to monitor her blood pressure as well as her progress with the above lifestyle modifications. She will continue metoprolol as prescribed and will watch for signs of hypotension as she continues her lifestyle modifications.  Depression Screen Kaydee had a strongly positive depression screening. Depression is commonly associated with obesity and often results in emotional eating behaviors. We will monitor this closely and work on CBT to help improve the non-hunger eating patterns. Referral to Psychology may be required if no improvement is seen as she continues in our clinic.  Obesity Anthonette is currently in the action stage of change and her goal is to continue with weight loss efforts. I recommend Archer begin the structured treatment plan as follows:  She has agreed to follow the Category 3 plan  +100 calories Jernee has been instructed to eventually work  up to a goal of 150 minutes of combined cardio and strengthening exercise per week  for weight loss and overall health benefits. We discussed the following Behavioral Modification Strategies today: increasing lean protein intake, decreasing simple carbohydrates , work on meal planning and easy cooking plans and decrease liquid calories   She was informed of the importance of frequent follow up visits to maximize her success with intensive lifestyle modifications for her multiple health conditions. She was informed we would discuss her lab results at her next visit unless there is a critical issue that needs to be addressed sooner. Abisola agreed to keep her next visit at the agreed upon time to discuss these results.    OBESITY BEHAVIORAL INTERVENTION VISIT  Today's visit was # 1 out of 22.  Starting weight: 447 lbs Starting date: 10/11/17 Today's weight : 447 lbs Today's date: 10/11/2017 Total lbs lost to date: 0 (Patients must lose 7 lbs in the first 6 months to continue with counseling)   ASK: We discussed the diagnosis of obesity with Brandy Saunders today and Maleigha agreed to give [place] permission to discuss obesity behavioral modification therapy today.  ASSESS: Martita has the diagnosis of obesity and her BMI today is 72.18 Luvada is in the action stage of change   ADVISE: Cicley was educated on the multiple health risks of obesity as well as the benefit of weight loss to improve her health. She was advised of the need for long term treatment and the importance of lifestyle modifications.  AGREE: Multiple dietary modification options and treatment options were discussed and  Sharnise agreed to the above obesity treatment plan.   I, Doreene Nest, am acting as transcriptionist for  Eber Jones, MD   I have reviewed the above documentation for accuracy and completeness, and I agree with the above. - Ilene Qua, MD

## 2017-10-12 LAB — CBC WITH DIFFERENTIAL
BASOS ABS: 0 10*3/uL (ref 0.0–0.2)
BASOS: 0 %
EOS (ABSOLUTE): 0.1 10*3/uL (ref 0.0–0.4)
Eos: 2 %
HEMOGLOBIN: 13 g/dL (ref 11.1–15.9)
Hematocrit: 39.9 % (ref 34.0–46.6)
IMMATURE GRANS (ABS): 0 10*3/uL (ref 0.0–0.1)
Immature Granulocytes: 0 %
LYMPHS: 36 %
Lymphocytes Absolute: 1.7 10*3/uL (ref 0.7–3.1)
MCH: 27.1 pg (ref 26.6–33.0)
MCHC: 32.6 g/dL (ref 31.5–35.7)
MCV: 83 fL (ref 79–97)
Monocytes Absolute: 0.2 10*3/uL (ref 0.1–0.9)
Monocytes: 3 %
NEUTROS ABS: 2.8 10*3/uL (ref 1.4–7.0)
NEUTROS PCT: 59 %
RBC: 4.79 x10E6/uL (ref 3.77–5.28)
RDW: 13.8 % (ref 12.3–15.4)
WBC: 4.7 10*3/uL (ref 3.4–10.8)

## 2017-10-12 LAB — FOLATE: FOLATE: 13.3 ng/mL (ref >3.0)

## 2017-10-12 LAB — COMPREHENSIVE METABOLIC PANEL
A/G RATIO: 1.6 (ref 1.2–2.2)
ALK PHOS: 52 IU/L (ref 39–117)
ALT: 15 IU/L (ref 0–32)
AST: 11 IU/L (ref 0–40)
Albumin: 4.1 g/dL (ref 3.5–5.5)
BILIRUBIN TOTAL: 0.3 mg/dL (ref 0.0–1.2)
BUN/Creatinine Ratio: 14 (ref 9–23)
BUN: 10 mg/dL (ref 6–20)
CHLORIDE: 101 mmol/L (ref 96–106)
CO2: 20 mmol/L (ref 20–29)
Calcium: 9 mg/dL (ref 8.7–10.2)
Creatinine, Ser: 0.72 mg/dL (ref 0.57–1.00)
GFR calc Af Amer: 133 mL/min/{1.73_m2} (ref >59)
GFR calc non Af Amer: 115 mL/min/{1.73_m2} (ref >59)
Globulin, Total: 2.6 g/dL (ref 1.5–4.5)
Glucose: 106 mg/dL — ABNORMAL HIGH (ref 65–99)
POTASSIUM: 4.9 mmol/L (ref 3.5–5.2)
Sodium: 137 mmol/L (ref 134–144)
Total Protein: 6.7 g/dL (ref 6.0–8.5)

## 2017-10-12 LAB — VITAMIN B12: VITAMIN B 12: 1152 pg/mL (ref 232–1245)

## 2017-10-12 LAB — INSULIN, RANDOM: INSULIN: 73.6 u[IU]/mL — ABNORMAL HIGH (ref 2.6–24.9)

## 2017-10-12 LAB — LIPID PANEL WITH LDL/HDL RATIO
Cholesterol, Total: 143 mg/dL (ref 100–199)
HDL: 53 mg/dL (ref >39)
LDL Calculated: 74 mg/dL (ref 0–99)
LDL/HDL RATIO: 1.4 ratio (ref 0.0–3.2)
Triglycerides: 78 mg/dL (ref 0–149)
VLDL Cholesterol Cal: 16 mg/dL (ref 5–40)

## 2017-10-12 LAB — T3: T3 TOTAL: 176 ng/dL (ref 71–180)

## 2017-10-12 LAB — HEMOGLOBIN A1C
ESTIMATED AVERAGE GLUCOSE: 126 mg/dL
Hgb A1c MFr Bld: 6 % — ABNORMAL HIGH (ref 4.8–5.6)

## 2017-10-12 LAB — T4, FREE: FREE T4: 1.19 ng/dL (ref 0.82–1.77)

## 2017-10-12 LAB — TSH: TSH: 1.88 u[IU]/mL (ref 0.450–4.500)

## 2017-10-12 LAB — VITAMIN D 25 HYDROXY (VIT D DEFICIENCY, FRACTURES): VIT D 25 HYDROXY: 30.5 ng/mL (ref 30.0–100.0)

## 2017-10-16 ENCOUNTER — Ambulatory Visit (INDEPENDENT_AMBULATORY_CARE_PROVIDER_SITE_OTHER): Payer: Managed Care, Other (non HMO) | Admitting: Family Medicine

## 2017-10-16 ENCOUNTER — Encounter: Payer: Self-pay | Admitting: Advanced Practice Midwife

## 2017-10-16 ENCOUNTER — Encounter (INDEPENDENT_AMBULATORY_CARE_PROVIDER_SITE_OTHER): Payer: Self-pay | Admitting: Family Medicine

## 2017-10-16 ENCOUNTER — Encounter: Payer: Self-pay | Admitting: Family Medicine

## 2017-10-16 ENCOUNTER — Ambulatory Visit (INDEPENDENT_AMBULATORY_CARE_PROVIDER_SITE_OTHER): Payer: Managed Care, Other (non HMO) | Admitting: Advanced Practice Midwife

## 2017-10-16 VITALS — BP 134/71 | HR 86 | Temp 98.2°F | Resp 16 | Ht 66.0 in | Wt >= 6400 oz

## 2017-10-16 VITALS — BP 134/71 | HR 86 | Ht 66.0 in | Wt >= 6400 oz

## 2017-10-16 DIAGNOSIS — N9089 Other specified noninflammatory disorders of vulva and perineum: Secondary | ICD-10-CM | POA: Diagnosis not present

## 2017-10-16 NOTE — Progress Notes (Signed)
Patient ID: Brandy Saunders, female   DOB: [birth year], 27 y.o.   MRN: [id number]    Subjective:  I acted as a Education administrator for Dr. Carollee Herter.  Guerry Bruin, [place]   Patient ID: Brandy Saunders, female    DOB: [birth year], 27 y.o.   MRN: [id number]  Chief Complaint  Patient presents with  . genital bumps    HPI  Patient is in today for genital bumps.  She just noticed while shaving ----they are not tender but they burn when she urinates  She recently had a pap and std testing in Nov per pt --- with gyn  Patient Care Team: Carollee Herter, Alferd Apa, DO as PCP - General (Family Medicine)   Past Medical History:  Diagnosis Date  . Anxiety   . Asthma   . Back pain   . Binge eating   . Carpal tunnel syndrome, bilateral   . Chest pain   . Common migraine with intractable migraine 02/02/2017  . Constipation   . Depression   . Dyspnea   . Fatty liver   . GERD (gastroesophageal reflux disease)   . Hypertension   . Joint pain   . Leg edema   . Morbidly obese ([place])   . Palpitations   . PCOS (polycystic ovarian syndrome)   . Prediabetes   . Vitamin D deficiency     Past Surgical History:  Procedure Laterality Date  . TONSILECTOMY, ADENOIDECTOMY, BILATERAL MYRINGOTOMY AND TUBES  1994    Family History  Problem Relation Age of Onset  . Hypertension Mother   . Hyperlipidemia Mother   . Obesity Mother   . Stroke Father   . Obesity Father   . Alcoholism Father   . Drug abuse Father   . Diabetes Maternal Uncle   . Heart disease Maternal Grandmother   . Colon cancer Neg Hx     Social History   Socioeconomic History  . Marital status: Single    Spouse name: n/a  . Number of children: 0  . Years of education: 74  . Highest education level: Not on file  Social Needs  . Financial resource strain: Not on file  . Food insecurity - worry: Not on file  . Food insecurity - inability: Not on file  . Transportation needs - medical: Not on file  . Transportation needs - non-medical: Not  on file  Occupational History  . Occupation: Public relations account executive  Tobacco Use  . Smoking status: Former Smoker    Types: Cigarettes  . Smokeless tobacco: Never Used  Substance and Sexual Activity  . Alcohol use: Yes    Alcohol/week: 1.8 oz    Types: 3 Glasses of wine per week  . Drug use: No    Comment: once every 6 months, taking CBD  . Sexual activity: Yes    Partners: Male    Birth control/protection: Condom, Pill    Comment: inconsistent condom use  Other Topics Concern  . Not on file  Social History Narrative   Lives with a roommate.     Parents live in [place], [place].   Right handed   Caffeine use: coffee weekly    Outpatient Medications Prior to Visit  Medication Sig Dispense Refill  . acetaminophen (TYLENOL) 500 MG tablet Take 500 mg by mouth every 6 (six) hours as needed.    . Ascorbic Acid (VITAMIN C) 1000 MG tablet Take 1,000 mg by mouth daily.    Marland Kitchen BIOTIN 5000 PO Take 10,000 mg by mouth daily.     Marland Kitchen  BLISOVI FE 1/20 1-20 MG-MCG tablet TAKE 1 TABLET DAILY 84 tablet 4  . cyanocobalamin 1000 MCG tablet Take 2,500 mcg by mouth daily.    . meloxicam (MOBIC) 15 MG tablet Take 1 tablet (15 mg total) by mouth daily. 30 tablet 0  . metoprolol succinate (TOPROL XL) 50 MG 24 hr tablet Take 3 tab po qd  with or immediately following a meal. 270 tablet 2  . Multiple Vitamins-Calcium (ONE-A-DAY WOMENS PO) Take 1 tablet by mouth daily.     . SUMAtriptan (IMITREX) 100 MG tablet Take 1 tablet (100 mg total) by mouth 2 (two) times daily as needed for migraine. 10 tablet 11   No facility-administered medications prior to visit.     Allergies  Allergen Reactions  . Ibuprofen Diarrhea  . Metformin And Related Diarrhea    Review of Systems  Constitutional: Negative for fever and malaise/fatigue.  HENT: Negative for congestion.   Eyes: Negative for blurred vision.  Respiratory: Negative for cough and shortness of breath.   Cardiovascular: Negative for chest pain, palpitations and  leg swelling.  Gastrointestinal: Negative for vomiting.  Musculoskeletal: Negative for back pain.  Skin: Negative for rash.  Neurological: Negative for loss of consciousness and headaches.       Objective:    Physical Exam  Genitourinary: Vagina normal.    There is lesion on the right labia. There is no tenderness on the right labia. There is lesion on the left labia. There is no tenderness on the left labia.  Nursing note and vitals reviewed.   BP 134/71 (BP Location: Right Wrist, Cuff Size: Normal)   Pulse 86   Temp 98.2 F (36.8 C) (Oral)   Resp 16   Ht 5\' 6"  (1.676 m)   Wt (!) 449 lb 12.8 oz (204 kg)   LMP 09/16/2017   SpO2 98%   BMI 72.60 kg/m  Wt Readings from Last 3 Encounters:  10/16/17 (!) 449 lb 12.8 oz (204 kg)  10/11/17 (!) 447 lb (202.8 kg)  09/19/17 (!) 443 lb (200.9 kg)   BP Readings from Last 3 Encounters:  10/16/17 134/71  10/11/17 139/78  09/19/17 124/80     Immunization History  Administered Date(s) Administered  . Td 07/30/2008    Health Maintenance  Topic Date Due  . INFLUENZA VACCINE  05/09/2018 (Originally 03/28/2017)  . TETANUS/TDAP  07/30/2018  . PAP SMEAR  04/12/2019  . HIV Screening  Completed    Lab Results  Component Value Date   WBC 4.7 10/11/2017   HGB 13.0 10/11/2017   HCT 39.9 10/11/2017   PLT 263.0 06/12/2017   GLUCOSE 106 (H) 10/11/2017   CHOL 143 10/11/2017   TRIG 78 10/11/2017   HDL 53 10/11/2017   LDLCALC 74 10/11/2017   ALT 15 10/11/2017   AST 11 10/11/2017   NA 137 10/11/2017   K 4.9 10/11/2017   CL 101 10/11/2017   CREATININE 0.72 10/11/2017   BUN 10 10/11/2017   CO2 20 10/11/2017   TSH 1.880 10/11/2017   HGBA1C 6.0 (H) 10/11/2017    Lab Results  Component Value Date   TSH 1.880 10/11/2017   Lab Results  Component Value Date   WBC 4.7 10/11/2017   HGB 13.0 10/11/2017   HCT 39.9 10/11/2017   MCV 83 10/11/2017   PLT 263.0 06/12/2017   Lab Results  Component Value Date   NA 137 10/11/2017   K  4.9 10/11/2017   CO2 20 10/11/2017   GLUCOSE 106 (H)  10/11/2017   BUN 10 10/11/2017   CREATININE 0.72 10/11/2017   BILITOT 0.3 10/11/2017   ALKPHOS 52 10/11/2017   AST 11 10/11/2017   ALT 15 10/11/2017   PROT 6.7 10/11/2017   ALBUMIN 4.1 10/11/2017   CALCIUM 9.0 10/11/2017   ANIONGAP 9 02/22/2017   GFR 128.66 09/13/2017   Lab Results  Component Value Date   CHOL 143 10/11/2017   Lab Results  Component Value Date   HDL 53 10/11/2017   Lab Results  Component Value Date   LDLCALC 74 10/11/2017   Lab Results  Component Value Date   TRIG 78 10/11/2017   Lab Results  Component Value Date   CHOLHDL 3 09/13/2017   Lab Results  Component Value Date   HGBA1C 6.0 (H) 10/11/2017         Assessment & Plan:   Problem List Items Addressed This Visit    None    Visit Diagnoses    Labial lesion    -  Primary   Relevant Orders   Ambulatory referral to Gynecology      I am having Newman. Megan Salon maintain her Multiple Vitamins-Calcium (ONE-A-DAY WOMENS PO), BIOTIN 5000 PO, vitamin C, BLISOVI FE 1/20, SUMAtriptan, meloxicam, metoprolol succinate, cyanocobalamin, and acetaminophen.  No orders of the defined types were placed in this encounter.   CMA served as Education administrator during this visit. History, Physical and Plan performed by medical provider. Documentation and orders reviewed and attested to.  Ann Held, DO

## 2017-10-16 NOTE — Progress Notes (Signed)
Subjective:     Patient ID: Brandy Saunders, female   DOB: Aug 13, 1991, 27 y.o.   MRN: [id number]  HPI: Sent from PCPs office for further eval of vulvar lesion that p[t noticed a few days ago while shaving. No Hx of vulvar lesion. Denies having any pain or itching besides slight irritation after urination. Did notice a lesion on her right inner thigh August 2018 that went away on it's own, but does know if these are similar. Doesn't know how long she has had these lesions because she usually doesn't shave and can't see the area on her own 2/2 to body habitus.   She states she has been in a mutually monogamous relationship x 2 years and gets regular STD testing. No Hx HVP, HSV. States partner does not have Hx of these either.   Review of Systems  Constitutional: Negative for chills and fever.  Genitourinary: Positive for genital sores. Negative for vaginal bleeding, vaginal discharge and vaginal pain.       Neg for pain, itching or drainage from lesions.    Objective:BP 134/71   Pulse 86   Ht 5\' 6"  (1.676 m)   Wt (!) 449 lb (203.7 kg)   LMP 09/16/2017   BMI 72.47 kg/m    Physical Exam  Constitutional: She appears well-developed and well-nourished. No distress.  Cardiovascular: Normal rate.  Pulmonary/Chest: Effort normal.  Genitourinary:    No labial fusion. There is no rash, tenderness or injury on the right labia. There is no rash, tenderness or injury on the left labia. No erythema or bleeding in the vagina. No vaginal discharge found.  Genitourinary Comments: #5 2-5 mm NT, slightly raised, firm, pink, non-fluctuant, non-vesicular lesions w/out erythema, drainage or bleeding. 4 lesions have a smooth surface, but smallest lesion has rough surface. No dimpling of lesions.   Skin: Skin is warm and dry.  Nursing note and vitals reviewed.     Assessment:     1. Vulvar lesion--Unsure etiology. DD includes:  - Genital warts: possible although most lesions have smooth surface  inconsistent w/ Dx  - HSV: Unlikely due no Hx pain. HVS culture obtained, but if they were HVS lesions they look too healed over to obtain useful culture. Discussed pros and cons of serology. Can review culture results and reconsider at F/U visit. HSV 2 Glycoprotein neg 2017.  - Molluscum Contagiosum: possible, but larger than expected and lacking central depression of lesion   - LSIL/HSIL of vulva: Consider Bx if no improvement   - Skin tags: Possible, but less likely w/ smooth surface  - F/U in 1-2 weeks w/ Dr. Ihor Dow. See if lesions have resolved. Consider Bx.  - Herpes simplex virus culture    Tamala Julian [place], [place] 10/16/2017 11:58 AM

## 2017-10-19 LAB — HERPES SIMPLEX VIRUS CULTURE

## 2017-10-30 ENCOUNTER — Ambulatory Visit (INDEPENDENT_AMBULATORY_CARE_PROVIDER_SITE_OTHER): Payer: Managed Care, Other (non HMO) | Admitting: Family Medicine

## 2017-10-30 VITALS — BP 115/72 | HR 75 | Temp 97.9°F | Ht 66.0 in | Wt >= 6400 oz

## 2017-10-30 DIAGNOSIS — Z9189 Other specified personal risk factors, not elsewhere classified: Secondary | ICD-10-CM | POA: Diagnosis not present

## 2017-10-30 DIAGNOSIS — E8881 Metabolic syndrome: Secondary | ICD-10-CM | POA: Diagnosis not present

## 2017-10-30 DIAGNOSIS — R7303 Prediabetes: Secondary | ICD-10-CM

## 2017-10-30 DIAGNOSIS — Z6841 Body Mass Index (BMI) 40.0 and over, adult: Secondary | ICD-10-CM

## 2017-10-30 DIAGNOSIS — E559 Vitamin D deficiency, unspecified: Secondary | ICD-10-CM

## 2017-10-30 MED ORDER — METFORMIN HCL 500 MG PO TABS
500.0000 mg | ORAL_TABLET | Freq: Every day | ORAL | 0 refills | Status: DC
Start: 2017-10-30 — End: 2017-11-29

## 2017-10-30 MED ORDER — VITAMIN D (ERGOCALCIFEROL) 1.25 MG (50000 UNIT) PO CAPS: 50000.0000 [IU] | ORAL_CAPSULE | ORAL | 0 refills | Status: DC

## 2017-10-30 NOTE — Progress Notes (Signed)
Office: [phone]  /  Fax: [phone]   HPI:   Chief Complaint: OBESITY Brandy Saunders is here to discuss her progress with her obesity treatment plan. She is on the Category 3 plan +100 calories and is following her eating plan approximately 90 % of the time. She states she is exercising 0 minutes 0 times per week. Cande found breakfast to be easier. She ate microwave meals at lunch and is getting in mostly 10 ounces of protein at dinner. Her weight is (!) 431 lb (195.5 kg) today and has had a weight loss of 16 pounds over a period of 2 to 3 weeks since her last visit. She has lost 16 lbs since starting treatment with [place].  Vitamin D deficiency Brandy Saunders has a diagnosis of vitamin D deficiency. She is not currently taking OTC vit D and denies nausea, vomiting or muscle weakness.   Ref. Range 10/11/2017 10:22  Vitamin D, 25-Hydroxy Latest Ref Range: 30.0 - 100.0 ng/mL 30.5   Pre-Diabetes Insulin Resistance Brandy Saunders has a diagnosis of pre-diabetes based on her elevated Hgb A1c and was informed this puts her at greater risk of developing diabetes. She is not taking metformin currently and continues to work on diet and exercise to decrease risk of diabetes. She admits to hypoglycemic events and cravings for sweets. She denies nausea    At risk for diabetes Brandy Saunders is at higher than average risk for developing diabetes due to her obesity and pre-diabetes insulin resistance. She currently denies polyuria or polydipsia.  ALLERGIES: Allergies  Allergen Reactions  . Ibuprofen Diarrhea  . Metformin And Related Diarrhea    MEDICATIONS: Current Outpatient Medications on File Prior to Visit  Medication Sig Dispense Refill  . acetaminophen (TYLENOL) 500 MG tablet Take 500 mg by mouth every 6 (six) hours as needed.    . Ascorbic Acid (VITAMIN C) 1000 MG tablet Take 1,000 mg by mouth daily.    Marland Kitchen BIOTIN 5000 PO Take 10,000 mg by mouth daily.     Marland Kitchen BLISOVI FE 1/20 1-20 MG-MCG tablet TAKE 1  TABLET DAILY 84 tablet 4  . cyanocobalamin 1000 MCG tablet Take 2,500 mcg by mouth daily.    . meloxicam (MOBIC) 15 MG tablet Take 1 tablet (15 mg total) by mouth daily. 30 tablet 0  . metoprolol succinate (TOPROL XL) 50 MG 24 hr tablet Take 3 tab po qd  with or immediately following a meal. 270 tablet 2  . Multiple Vitamins-Calcium (ONE-A-DAY WOMENS PO) Take 1 tablet by mouth daily.     . SUMAtriptan (IMITREX) 100 MG tablet Take 1 tablet (100 mg total) by mouth 2 (two) times daily as needed for migraine. 10 tablet 11   No current facility-administered medications on file prior to visit.     PAST MEDICAL HISTORY: Past Medical History:  Diagnosis Date  . Anxiety   . Asthma   . Back pain   . Binge eating   . Carpal tunnel syndrome, bilateral   . Chest pain   . Common migraine with intractable migraine 02/02/2017  . Constipation   . Depression   . Dyspnea   . Fatty liver   . GERD (gastroesophageal reflux disease)   . Hypertension   . Joint pain   . Leg edema   . Morbidly obese ([place])   . Palpitations   . PCOS (polycystic ovarian syndrome)   . Prediabetes   . Vitamin D deficiency     PAST SURGICAL HISTORY: Past Surgical History:  Procedure Laterality Date  .  TONSILECTOMY, ADENOIDECTOMY, BILATERAL MYRINGOTOMY AND TUBES  1994    SOCIAL HISTORY: Social History   Tobacco Use  . Smoking status: Former Smoker    Types: Cigarettes  . Smokeless tobacco: Never Used  Substance Use Topics  . Alcohol use: Yes    Alcohol/week: 1.8 oz    Types: 3 Glasses of wine per week  . Drug use: No    Comment: once every 6 months, taking CBD    FAMILY HISTORY: Family History  Problem Relation Age of Onset  . Hypertension Mother   . Hyperlipidemia Mother   . Obesity Mother   . Stroke Father   . Obesity Father   . Alcoholism Father   . Drug abuse Father   . Diabetes Maternal Uncle   . Heart disease Maternal Grandmother   . Colon cancer Neg Hx     ROS: Review of Systems    Constitutional: Positive for weight loss.  Gastrointestinal: Negative for nausea and vomiting.  Genitourinary: Negative for frequency.  Musculoskeletal:       Negative for muscle weakness  Endo/Heme/Allergies: Negative for polydipsia.       Positive for hypoglycemia    PHYSICAL EXAM: Blood pressure 115/72, pulse 75, temperature 97.9 F (36.6 C), temperature source Oral, height 5\' 6"  (1.676 m), weight (!) 431 lb (195.5 kg), SpO2 98 %. Body mass index is 69.57 kg/m. Physical Exam  Constitutional: She is oriented to person, place, and time. She appears well-developed and well-nourished.  Cardiovascular: Normal rate.  Pulmonary/Chest: Effort normal.  Musculoskeletal: Normal range of motion.  Neurological: She is oriented to person, place, and time.  Skin: Skin is warm and dry.  Psychiatric: She has a normal mood and affect. Her behavior is normal.  Vitals reviewed.   RECENT LABS AND TESTS: BMET    Component Value Date/Time   NA 137 10/11/2017 1022   K 4.9 10/11/2017 1022   CL 101 10/11/2017 1022   CO2 20 10/11/2017 1022   GLUCOSE 106 (H) 10/11/2017 1022   GLUCOSE 114 (H) 09/13/2017 0929   BUN 10 10/11/2017 1022   CREATININE 0.72 10/11/2017 1022   CREATININE 0.67 09/28/2014 1146   CALCIUM 9.0 10/11/2017 1022   GFRNONAA 115 10/11/2017 1022   GFRAA 133 10/11/2017 1022   Lab Results  Component Value Date   HGBA1C 6.0 (H) 10/11/2017   HGBA1C 6.1 09/13/2017   HGBA1C 5.9 06/12/2017   HGBA1C 5.8 01/07/2016   HGBA1C 6.3 10/05/2014   Lab Results  Component Value Date   INSULIN 73.6 (H) 10/11/2017   CBC    Component Value Date/Time   WBC 4.7 10/11/2017 1022   WBC 4.9 06/12/2017 1344   RBC 4.79 10/11/2017 1022   RBC 4.97 06/12/2017 1344   HGB 13.0 10/11/2017 1022   HCT 39.9 10/11/2017 1022   PLT 263.0 06/12/2017 1344   MCV 83 10/11/2017 1022   MCH 27.1 10/11/2017 1022   MCH 24.5 (L) 09/28/2014 1146   MCHC 32.6 10/11/2017 1022   MCHC 32.5 06/12/2017 1344   RDW  13.8 10/11/2017 1022   LYMPHSABS 1.7 10/11/2017 1022   MONOABS 0.2 06/12/2017 1344   EOSABS 0.1 10/11/2017 1022   BASOSABS 0.0 10/11/2017 1022   Iron/TIBC/Ferritin/ %Sat No results found for: IRON, TIBC, FERRITIN, IRONPCTSAT Lipid Panel     Component Value Date/Time   CHOL 143 10/11/2017 1022   TRIG 78 10/11/2017 1022   HDL 53 10/11/2017 1022   CHOLHDL 3 09/13/2017 0929   VLDL 16.4 09/13/2017 0929  LDLCALC 74 10/11/2017 1022   Hepatic Function Panel     Component Value Date/Time   PROT 6.7 10/11/2017 1022   ALBUMIN 4.1 10/11/2017 1022   AST 11 10/11/2017 1022   ALT 15 10/11/2017 1022   ALKPHOS 52 10/11/2017 1022   BILITOT 0.3 10/11/2017 1022   BILIDIR 0.1 09/28/2014 1146   IBILI 0.4 09/28/2014 1146      Component Value Date/Time   TSH 1.880 10/11/2017 1022   TSH 1.98 06/12/2017 1344   TSH 1.49 04/11/2016 1627    Ref. Range 10/11/2017 10:22  Vitamin D, 25-Hydroxy Latest Ref Range: 30.0 - 100.0 ng/mL 30.5   ASSESSMENT AND PLAN: Vitamin D deficiency - Plan: Vitamin D, Ergocalciferol, (DRISDOL) 50000 units CAPS capsule  Pre-diabetes - Plan: metFORMIN (GLUCOPHAGE) 500 MG tablet  Insulin resistance  At risk for diabetes mellitus  Class 3 severe obesity with serious comorbidity and body mass index (BMI) of 60.0 to 69.9 in adult, unspecified obesity type ([place])  PLAN:  Vitamin D Deficiency Brandy Saunders was informed that low vitamin D levels contributes to fatigue and are associated with obesity, breast, and colon cancer. She agrees to start to take prescription Vit D @50 ,000 IU every week #4 with no refills and will follow up for routine testing of vitamin D, at least 2-3 times per year. She was informed of the risk of over-replacement of vitamin D and agrees to not increase her dose unless she discusses this with [place] first. Brandy Saunders agrees to follow up with our clinic in 2 weeks.  Pre-Diabetes Insulin Resistance Brandy Saunders will continue to work on weight loss, exercise, and  decreasing simple carbohydrates in her diet to help decrease the risk of diabetes. We dicussed metformin including benefits and risks. We discussed the potential for increased fertility on metformin and the need to take birth control consistently. She was informed that eating too many simple carbohydrates or too many calories at one sitting increases the likelihood of GI side effects. Brandy Saunders agrees to start metformin 500 mg qam #30 with no refills  Brandy Saunders agreed to follow up with [place] as directed to monitor her progress.  Diabetes risk counseling Brandy Saunders was given extended (30 minutes) diabetes prevention counseling today. She is 27 y.o. female and has risk factors for diabetes including obesity and pre-diabetes insulin resistance. We discussed intensive lifestyle modifications today with an emphasis on weight loss as well as increasing exercise and decreasing simple carbohydrates in her diet.  Obesity Brandy Saunders is currently in the action stage of change. As such, her goal is to continue with weight loss efforts She has agreed to follow the Category 3 plan +100 calories Brandy Saunders has been instructed to work up to a goal of 150 minutes of combined cardio and strengthening exercise per week for weight loss and overall health benefits. We discussed the following Behavioral Modification Strategies today: increase H2O intake, planning for success and work on meal planning and easy cooking plans  Brandy Saunders has agreed to follow up with our clinic in 2 weeks. She was informed of the importance of frequent follow up visits to maximize her success with intensive lifestyle modifications for her multiple health conditions.   OBESITY BEHAVIORAL INTERVENTION VISIT  Today's visit was # 2 out of 22.  Starting weight: 447 lbs Starting date: 10/11/17 Today's weight : 431 lbs Today's date: 10/30/2017 Total lbs lost to date: 16 (Patients must lose 7 lbs in the first 6 months to continue with  counseling)   ASK: We discussed the diagnosis of  obesity with Brandy Saunders today and Brandy Saunders agreed to give [place] permission to discuss obesity behavioral modification therapy today.  ASSESS: Brandy Saunders has the diagnosis of obesity and her BMI today is 69.6 Brandy Saunders is in the action stage of change   ADVISE: Brandy Saunders was educated on the multiple health risks of obesity as well as the benefit of weight loss to improve her health. She was advised of the need for long term treatment and the importance of lifestyle modifications.  AGREE: Multiple dietary modification options and treatment options were discussed and  Brandy Saunders agreed to the above obesity treatment plan.  I, Doreene Nest, am acting as transcriptionist for Eber Jones, MD  I have reviewed the above documentation for accuracy and completeness, and I agree with the above. - Ilene Qua, MD

## 2017-11-05 ENCOUNTER — Ambulatory Visit (INDEPENDENT_AMBULATORY_CARE_PROVIDER_SITE_OTHER): Payer: Managed Care, Other (non HMO) | Admitting: Obstetrics & Gynecology

## 2017-11-05 ENCOUNTER — Encounter: Payer: Self-pay | Admitting: Obstetrics & Gynecology

## 2017-11-05 VITALS — BP 130/55 | HR 69 | Ht 66.0 in | Wt >= 6400 oz

## 2017-11-05 DIAGNOSIS — N9089 Other specified noninflammatory disorders of vulva and perineum: Secondary | ICD-10-CM | POA: Diagnosis not present

## 2017-11-05 NOTE — Progress Notes (Signed)
History:  27 y.o. G0P0000 here today for f/u of a vulvar lesion that she noted on Feb 14th after she shaved. The lesions were not painful. She was seen and evaluated and referred here for further eval but, the lesions have resolved completely. The pt still wants to be eval for reassurance.  She is sexually active with 1 partner.      The following portions of the patient's history were reviewed and updated as appropriate: allergies, current medications, past family history, past medical history, past social history, past surgical history and problem list.  Review of Systems:  Pertinent items are noted in HPI.   Objective:  Physical Exam Blood pressure (!) 130/55, pulse 69, height 5\' 6"  (1.676 m), weight (!) 432 lb (196 kg), last menstrual period 10/16/2017.  CONSTITUTIONAL: Well-developed, well-nourished female in no acute distress.  HENT:  Normocephalic, atraumatic EYES: Conjunctivae and EOM are normal. No scleral icterus.  NECK: Normal range of motion SKIN: Skin is warm and dry. No rash noted. Not diaphoretic.No pallor. [place]: Alert and oriented to person, place, and time. Normal coordination.  Pelvic: Normal appearing external genitalia; no external lesions are noted.     Assessment & Plan:  Vulvar lesion- resolved.  Unsure of etiology.   reviewed care of the vulva.  Pt to f/u prn  Ziara Thelander L. Harraway-Smith, M.D., [name]

## 2017-11-13 ENCOUNTER — Ambulatory Visit (INDEPENDENT_AMBULATORY_CARE_PROVIDER_SITE_OTHER): Payer: Managed Care, Other (non HMO) | Admitting: Family Medicine

## 2017-11-13 VITALS — BP 113/75 | HR 74 | Temp 98.2°F | Ht 66.0 in | Wt >= 6400 oz

## 2017-11-13 DIAGNOSIS — R7303 Prediabetes: Secondary | ICD-10-CM | POA: Diagnosis not present

## 2017-11-13 DIAGNOSIS — E559 Vitamin D deficiency, unspecified: Secondary | ICD-10-CM

## 2017-11-13 DIAGNOSIS — Z6841 Body Mass Index (BMI) 40.0 and over, adult: Secondary | ICD-10-CM

## 2017-11-13 NOTE — Progress Notes (Signed)
Office: [phone]  /  Fax: [phone]   HPI:   Chief Complaint: OBESITY Brandy Saunders is here to discuss her progress with her obesity treatment plan. She is on  the Category 3 plan +100 calories and is following her eating plan approximately 87 % of the time. She states she is exercising 0 minutes 0 times per week. Brandy Saunders had a stressful night last night. She was up late cooking because she had to stay late at work. Her weight is (!) 427 lb (193.7 kg) today and has had a weight loss of 4 pounds over a period of 2 weeks since her last visit. She has lost 20 lbs since starting treatment with [place].  Pre-Diabetes Brandy Saunders has a diagnosis of pre-diabetes based on her elevated Hgb A1c and was informed this puts her at greater risk of developing diabetes. She is taking metformin currently and continues to work on diet and exercise to decrease risk of diabetes. She denies any GI side effects or hypoglycemia.  Vitamin D deficiency Brandy Saunders has a diagnosis of vitamin D deficiency. She is currently taking vit D and denies nausea, vomiting or muscle weakness.  ALLERGIES: Allergies  Allergen Reactions  . Ibuprofen Diarrhea  . Metformin And Related Diarrhea    MEDICATIONS: Current Outpatient Medications on File Prior to Visit  Medication Sig Dispense Refill  . acetaminophen (TYLENOL) 500 MG tablet Take 500 mg by mouth every 6 (six) hours as needed.    . Ascorbic Acid (VITAMIN C) 1000 MG tablet Take 1,000 mg by mouth daily.    Marland Kitchen BIOTIN 5000 PO Take 10,000 mg by mouth daily.     Marland Kitchen BLISOVI FE 1/20 1-20 MG-MCG tablet TAKE 1 TABLET DAILY 84 tablet 4  . cyanocobalamin 1000 MCG tablet Take 2,500 mcg by mouth daily.    . metFORMIN (GLUCOPHAGE) 500 MG tablet Take 1 tablet (500 mg total) by mouth daily with breakfast. 30 tablet 0  . metoprolol succinate (TOPROL XL) 50 MG 24 hr tablet Take 3 tab po qd  with or immediately following a meal. 270 tablet 2  . Multiple Vitamins-Calcium (ONE-A-DAY WOMENS PO)  Take 1 tablet by mouth daily.     . SUMAtriptan (IMITREX) 100 MG tablet Take 1 tablet (100 mg total) by mouth 2 (two) times daily as needed for migraine. 10 tablet 11  . Vitamin D, Ergocalciferol, (DRISDOL) 50000 units CAPS capsule Take 1 capsule (50,000 Units total) by mouth every 7 (seven) days. 4 capsule 0   No current facility-administered medications on file prior to visit.     PAST MEDICAL HISTORY: Past Medical History:  Diagnosis Date  . Anxiety   . Asthma   . Back pain   . Binge eating   . Carpal tunnel syndrome, bilateral   . Chest pain   . Common migraine with intractable migraine 02/02/2017  . Constipation   . Depression   . Dyspnea   . Fatty liver   . GERD (gastroesophageal reflux disease)   . Hypertension   . Joint pain   . Leg edema   . Morbidly obese ([place])   . Palpitations   . PCOS (polycystic ovarian syndrome)   . Prediabetes   . Vitamin D deficiency     PAST SURGICAL HISTORY: Past Surgical History:  Procedure Laterality Date  . TONSILECTOMY, ADENOIDECTOMY, BILATERAL MYRINGOTOMY AND TUBES  1994    SOCIAL HISTORY: Social History   Tobacco Use  . Smoking status: Former Smoker    Types: Cigarettes  . Smokeless tobacco:  Never Used  Substance Use Topics  . Alcohol use: Yes    Alcohol/week: 1.8 oz    Types: 3 Glasses of wine per week  . Drug use: No    Comment: once every 6 months, taking CBD    FAMILY HISTORY: Family History  Problem Relation Age of Onset  . Hypertension Mother   . Hyperlipidemia Mother   . Obesity Mother   . Stroke Father   . Obesity Father   . Alcoholism Father   . Drug abuse Father   . Diabetes Maternal Uncle   . Heart disease Maternal Grandmother   . Colon cancer Neg Hx     ROS: Review of Systems  Constitutional: Positive for weight loss.  Gastrointestinal: Negative for diarrhea, nausea and vomiting.  Musculoskeletal:       Negative for muscle weakness  Endo/Heme/Allergies:       Negative for hypoglycemia     PHYSICAL EXAM: Blood pressure 113/75, pulse 74, temperature 98.2 F (36.8 C), temperature source Oral, height 5\' 6"  (1.676 m), weight (!) 427 lb (193.7 kg), last menstrual period 10/16/2017, SpO2 100 %. Body mass index is 68.92 kg/m. Physical Exam  Constitutional: She is oriented to person, place, and time. She appears well-developed and well-nourished.  Cardiovascular: Normal rate.  Pulmonary/Chest: Effort normal.  Musculoskeletal: Normal range of motion.  Neurological: She is oriented to person, place, and time.  Skin: Skin is warm and dry.  Psychiatric: She has a normal mood and affect. Her behavior is normal.  Vitals reviewed.   RECENT LABS AND TESTS: BMET    Component Value Date/Time   NA 137 10/11/2017 1022   K 4.9 10/11/2017 1022   CL 101 10/11/2017 1022   CO2 20 10/11/2017 1022   GLUCOSE 106 (H) 10/11/2017 1022   GLUCOSE 114 (H) 09/13/2017 0929   BUN 10 10/11/2017 1022   CREATININE 0.72 10/11/2017 1022   CREATININE 0.67 09/28/2014 1146   CALCIUM 9.0 10/11/2017 1022   GFRNONAA 115 10/11/2017 1022   GFRAA 133 10/11/2017 1022   Lab Results  Component Value Date   HGBA1C 6.0 (H) 10/11/2017   HGBA1C 6.1 09/13/2017   HGBA1C 5.9 06/12/2017   HGBA1C 5.8 01/07/2016   HGBA1C 6.3 10/05/2014   Lab Results  Component Value Date   INSULIN 73.6 (H) 10/11/2017   CBC    Component Value Date/Time   WBC 4.7 10/11/2017 1022   WBC 4.9 06/12/2017 1344   RBC 4.79 10/11/2017 1022   RBC 4.97 06/12/2017 1344   HGB 13.0 10/11/2017 1022   HCT 39.9 10/11/2017 1022   PLT 263.0 06/12/2017 1344   MCV 83 10/11/2017 1022   MCH 27.1 10/11/2017 1022   MCH 24.5 (L) 09/28/2014 1146   MCHC 32.6 10/11/2017 1022   MCHC 32.5 06/12/2017 1344   RDW 13.8 10/11/2017 1022   LYMPHSABS 1.7 10/11/2017 1022   MONOABS 0.2 06/12/2017 1344   EOSABS 0.1 10/11/2017 1022   BASOSABS 0.0 10/11/2017 1022   Iron/TIBC/Ferritin/ %Sat No results found for: IRON, TIBC, FERRITIN, IRONPCTSAT Lipid  Panel     Component Value Date/Time   CHOL 143 10/11/2017 1022   TRIG 78 10/11/2017 1022   HDL 53 10/11/2017 1022   CHOLHDL 3 09/13/2017 0929   VLDL 16.4 09/13/2017 0929   LDLCALC 74 10/11/2017 1022   Hepatic Function Panel     Component Value Date/Time   PROT 6.7 10/11/2017 1022   ALBUMIN 4.1 10/11/2017 1022   AST 11 10/11/2017 1022   ALT  15 10/11/2017 1022   ALKPHOS 52 10/11/2017 1022   BILITOT 0.3 10/11/2017 1022   BILIDIR 0.1 09/28/2014 1146   IBILI 0.4 09/28/2014 1146      Component Value Date/Time   TSH 1.880 10/11/2017 1022   TSH 1.98 06/12/2017 1344   TSH 1.49 04/11/2016 1627   Results for YOSELYN, MCGLADE (MRN [id number]) as of 11/13/2017 17:35  Ref. Range 10/11/2017 10:22  Vitamin D, 25-Hydroxy Latest Ref Range: 30.0 - 100.0 ng/mL 30.5   ASSESSMENT AND PLAN: Prediabetes  Vitamin D deficiency  Class 3 severe obesity with serious comorbidity and body mass index (BMI) of 60.0 to 69.9 in adult, unspecified obesity type ([place])  PLAN:  Pre-Diabetes Gabrielly will continue to work on weight loss, exercise, and decreasing simple carbohydrates in her diet to help decrease the risk of diabetes. We dicussed metformin including benefits and risks. She was informed that eating too many simple carbohydrates or too many calories at one sitting increases the likelihood of GI side effects. Makayleigh agrees to continue metformin (no refill needed) and  follow up with [place] as directed to monitor her progress.  Vitamin D Deficiency Shekelia was informed that low vitamin D levels contributes to fatigue and are associated with obesity, breast, and colon cancer. She agrees to continue to take prescription Vit D @50 ,000 IU every week (no refill needed) and will follow up for routine testing of vitamin D, at least 2-3 times per year. She was informed of the risk of over-replacement of vitamin D and agrees to not increase her dose unless she discusses this with [place] first.  We spent > than  50% of the 15 minute visit on the counseling as documented in the note.  Obesity Cadynce is currently in the action stage of change. As such, her goal is to continue with weight loss efforts She has agreed to keep a food journal with 450 to 600 calories and 40 grams of protein at supper daily and follow the Category 3 plan +100 calories Kerensa has been instructed to work up to a goal of 150 minutes of combined cardio and strengthening exercise per week for weight loss and overall health benefits. We discussed the following Behavioral Modification Strategies today: planning for success, keep a strict food journal, increasing lean protein intake, increasing vegetables and work on meal planning and easy cooking plans  Tanicia has agreed to follow up with our clinic in 2 weeks. She was informed of the importance of frequent follow up visits to maximize her success with intensive lifestyle modifications for her multiple health conditions.   OBESITY BEHAVIORAL INTERVENTION VISIT  Today's visit was # 3 out of 22.  Starting weight: 447 lbs Starting date: 10/11/17 Today's weight : 427 lbs  Today's date: 11/13/2017 Total lbs lost to date: 20 (Patients must lose 7 lbs in the first 6 months to continue with counseling)   ASK: We discussed the diagnosis of obesity with [name] today and Tarri agreed to give [place] permission to discuss obesity behavioral modification therapy today.  ASSESS: Rhen has the diagnosis of obesity and her BMI today is 68.95 Tamanna is in the action stage of change   ADVISE: Marenda was educated on the multiple health risks of obesity as well as the benefit of weight loss to improve her health. She was advised of the need for long term treatment and the importance of lifestyle modifications.  AGREE: Multiple dietary modification options and treatment options were discussed and  Addilynne agreed to  the above obesity treatment plan.  I, Doreene Nest,  am acting as transcriptionist for Eber Jones, MD  I have reviewed the above documentation for accuracy and completeness, and I agree with the above. - Ilene Qua, MD

## 2017-11-20 ENCOUNTER — Encounter (INDEPENDENT_AMBULATORY_CARE_PROVIDER_SITE_OTHER): Payer: Self-pay | Admitting: Family Medicine

## 2017-11-28 ENCOUNTER — Ambulatory Visit: Payer: Managed Care, Other (non HMO) | Admitting: Adult Health

## 2017-11-29 ENCOUNTER — Ambulatory Visit (INDEPENDENT_AMBULATORY_CARE_PROVIDER_SITE_OTHER): Payer: Managed Care, Other (non HMO) | Admitting: Family Medicine

## 2017-11-29 VITALS — BP 119/77 | HR 73 | Temp 98.1°F | Ht 66.0 in | Wt >= 6400 oz

## 2017-11-29 DIAGNOSIS — Z9189 Other specified personal risk factors, not elsewhere classified: Secondary | ICD-10-CM

## 2017-11-29 DIAGNOSIS — Z6841 Body Mass Index (BMI) 40.0 and over, adult: Secondary | ICD-10-CM

## 2017-11-29 DIAGNOSIS — E66813 Obesity, class 3: Secondary | ICD-10-CM

## 2017-11-29 DIAGNOSIS — E559 Vitamin D deficiency, unspecified: Secondary | ICD-10-CM

## 2017-11-29 DIAGNOSIS — R7303 Prediabetes: Secondary | ICD-10-CM

## 2017-11-29 MED ORDER — VITAMIN D (ERGOCALCIFEROL) 1.25 MG (50000 UNIT) PO CAPS: 50000.0000 [IU] | ORAL_CAPSULE | ORAL | 0 refills | Status: DC

## 2017-11-29 MED ORDER — METFORMIN HCL 500 MG PO TABS: 500.0000 mg | ORAL_TABLET | Freq: Every day | ORAL | 0 refills | Status: DC

## 2017-11-29 NOTE — Progress Notes (Signed)
Office: [phone]  /  Fax: [phone]   HPI:   Chief Complaint: OBESITY Brandy Saunders is here to discuss her progress with her obesity treatment plan. She is on the keep a food journal with 450 to 600 calories and 40 grams of protein at supper daily and the Category 3 plan +100 calories and is following her eating plan approximately 85 % of the time. She states she is exercising 0 minutes 0 times per week. Brandy Saunders hunger has been "all over the place". She is going to [facility] for  "[employer]y [employer]" for the next handful of days. She has been strictly journaling all day and is really enjoying it. Her weight is (!) 420 lb (190.5 kg) today and has had a weight loss of 7 pounds over a period of 2 weeks since her last visit. She has lost 27 lbs since starting treatment with [place].  Vitamin D deficiency Brandy Saunders has a diagnosis of vitamin D deficiency. She is currently taking vit D and fatigue is improving. She denies nausea, vomiting or muscle weakness.  Pre-Diabetes Brandy Saunders has a diagnosis of pre-diabetes based on her elevated Hgb A1c and was informed this puts her at greater risk of developing diabetes. Her hunger is waxing and waning and she is taking metformin currently. Brandy Saunders continues to work on diet and exercise to decrease risk of diabetes. She denies nausea or hypoglycemia.  At risk for diabetes Brandy Saunders is at higher than average risk for developing diabetes due to her obesity and pre-diabetes. She currently denies polyuria or polydipsia.  ALLERGIES: Allergies  Allergen Reactions  . Ibuprofen Diarrhea  . Metformin And Related Diarrhea    MEDICATIONS: Current Outpatient Medications on File Prior to Visit  Medication Sig Dispense Refill  . acetaminophen (TYLENOL) 500 MG tablet Take 500 mg by mouth every 6 (six) hours as needed.    . Ascorbic Acid (VITAMIN C) 1000 MG tablet Take 1,000 mg by mouth daily.    Marland Kitchen BIOTIN 5000 PO Take 10,000 mg by mouth daily.     Marland Kitchen BLISOVI FE 1/20 1-20 MG-MCG  tablet TAKE 1 TABLET DAILY 84 tablet 4  . cyanocobalamin 1000 MCG tablet Take 2,500 mcg by mouth daily.    . metoprolol succinate (TOPROL XL) 50 MG 24 hr tablet Take 3 tab po qd  with or immediately following a meal. 270 tablet 2  . Multiple Vitamins-Calcium (ONE-A-DAY WOMENS PO) Take 1 tablet by mouth daily.     . SUMAtriptan (IMITREX) 100 MG tablet Take 1 tablet (100 mg total) by mouth 2 (two) times daily as needed for migraine. 10 tablet 11   No current facility-administered medications on file prior to visit.     PAST MEDICAL HISTORY: Past Medical History:  Diagnosis Date  . Anxiety   . Asthma   . Back pain   . Binge eating   . Carpal tunnel syndrome, bilateral   . Chest pain   . Common migraine with intractable migraine 02/02/2017  . Constipation   . Depression   . Dyspnea   . Fatty liver   . GERD (gastroesophageal reflux disease)   . Hypertension   . Joint pain   . Leg edema   . Morbidly obese ([place])   . Palpitations   . PCOS (polycystic ovarian syndrome)   . Prediabetes   . Vitamin D deficiency     PAST SURGICAL HISTORY: Past Surgical History:  Procedure Laterality Date  . TONSILECTOMY, ADENOIDECTOMY, BILATERAL MYRINGOTOMY AND TUBES  1994    SOCIAL  HISTORY: Social History   Tobacco Use  . Smoking status: Former Smoker    Types: Cigarettes  . Smokeless tobacco: Never Used  Substance Use Topics  . Alcohol use: Yes    Alcohol/week: 1.8 oz    Types: 3 Glasses of wine per week  . Drug use: No    Types: Marijuana    Comment: once every 6 months, taking CBD    FAMILY HISTORY: Family History  Problem Relation Age of Onset  . Hypertension Mother   . Hyperlipidemia Mother   . Obesity Mother   . Stroke Father   . Obesity Father   . Alcoholism Father   . Drug abuse Father   . Diabetes Maternal Uncle   . Heart disease Maternal Grandmother   . Colon cancer Neg Hx     ROS: Review of Systems  Constitutional: Positive for malaise/fatigue and weight loss.    Gastrointestinal: Negative for nausea and vomiting.  Genitourinary: Negative for frequency.  Musculoskeletal:       Negative for muscle weakness  Endo/Heme/Allergies: Negative for polydipsia.       Negative for hypoglycemia Positive for polyphagia    PHYSICAL EXAM: Blood pressure 119/77, pulse 73, temperature 98.1 F (36.7 C), temperature source Oral, height 5\' 6"  (1.676 m), weight (!) 420 lb (190.5 kg), SpO2 99 %. Body mass index is 67.79 kg/m. Physical Exam  Constitutional: She is oriented to person, place, and time. She appears well-developed and well-nourished.  Cardiovascular: Normal rate.  Pulmonary/Chest: Effort normal.  Musculoskeletal: Normal range of motion.  Neurological: She is oriented to person, place, and time.  Skin: Skin is warm and dry.  Psychiatric: She has a normal mood and affect. Her behavior is normal.  Vitals reviewed.   RECENT LABS AND TESTS: BMET    Component Value Date/Time   NA 137 10/11/2017 1022   K 4.9 10/11/2017 1022   CL 101 10/11/2017 1022   CO2 20 10/11/2017 1022   GLUCOSE 106 (H) 10/11/2017 1022   GLUCOSE 114 (H) 09/13/2017 0929   BUN 10 10/11/2017 1022   CREATININE 0.72 10/11/2017 1022   CREATININE 0.67 09/28/2014 1146   CALCIUM 9.0 10/11/2017 1022   GFRNONAA 115 10/11/2017 1022   GFRAA 133 10/11/2017 1022   Lab Results  Component Value Date   HGBA1C 6.0 (H) 10/11/2017   HGBA1C 6.1 09/13/2017   HGBA1C 5.9 06/12/2017   HGBA1C 5.8 01/07/2016   HGBA1C 6.3 10/05/2014   Lab Results  Component Value Date   INSULIN 73.6 (H) 10/11/2017   CBC    Component Value Date/Time   WBC 4.7 10/11/2017 1022   WBC 4.9 06/12/2017 1344   RBC 4.79 10/11/2017 1022   RBC 4.97 06/12/2017 1344   HGB 13.0 10/11/2017 1022   HCT 39.9 10/11/2017 1022   PLT 263.0 06/12/2017 1344   MCV 83 10/11/2017 1022   MCH 27.1 10/11/2017 1022   MCH 24.5 (L) 09/28/2014 1146   MCHC 32.6 10/11/2017 1022   MCHC 32.5 06/12/2017 1344   RDW 13.8 10/11/2017 1022    LYMPHSABS 1.7 10/11/2017 1022   MONOABS 0.2 06/12/2017 1344   EOSABS 0.1 10/11/2017 1022   BASOSABS 0.0 10/11/2017 1022   Iron/TIBC/Ferritin/ %Sat No results found for: IRON, TIBC, FERRITIN, IRONPCTSAT Lipid Panel     Component Value Date/Time   CHOL 143 10/11/2017 1022   TRIG 78 10/11/2017 1022   HDL 53 10/11/2017 1022   CHOLHDL 3 09/13/2017 0929   VLDL 16.4 09/13/2017 0929   LDLCALC  74 10/11/2017 1022   Hepatic Function Panel     Component Value Date/Time   PROT 6.7 10/11/2017 1022   ALBUMIN 4.1 10/11/2017 1022   AST 11 10/11/2017 1022   ALT 15 10/11/2017 1022   ALKPHOS 52 10/11/2017 1022   BILITOT 0.3 10/11/2017 1022   BILIDIR 0.1 09/28/2014 1146   IBILI 0.4 09/28/2014 1146      Component Value Date/Time   TSH 1.880 10/11/2017 1022   TSH 1.98 06/12/2017 1344   TSH 1.49 04/11/2016 1627   Results for PURVA, VESSELL (MRN [id number]) as of 11/29/2017 13:44  Ref. Range 10/11/2017 10:22  Vitamin D, 25-Hydroxy Latest Ref Range: 30.0 - 100.0 ng/mL 30.5   ASSESSMENT AND PLAN: At risk for diabetes mellitus  Vitamin D deficiency - Plan: Vitamin D, Ergocalciferol, (DRISDOL) 50000 units CAPS capsule  Pre-diabetes - Plan: metFORMIN (GLUCOPHAGE) 500 MG tablet  Class 3 severe obesity with serious comorbidity and body mass index (BMI) of 60.0 to 69.9 in adult, unspecified obesity type ([place])  PLAN:  Vitamin D Deficiency Shalom was informed that low vitamin D levels contributes to fatigue and are associated with obesity, breast, and colon cancer. She agrees to continue to take prescription Vit D @50 ,000 IU every week #4 with no refills and will follow up for routine testing of vitamin D, at least 2-3 times per year. She was informed of the risk of over-replacement of vitamin D and agrees to not increase her dose unless she discusses this with [place] first. Delanda agrees to follow up with our clinic in 2 weeks.  Pre-Diabetes Wai will continue to work on weight loss,  exercise, and decreasing simple carbohydrates in her diet to help decrease the risk of diabetes. We dicussed metformin including benefits and risks. She was informed that eating too many simple carbohydrates or too many calories at one sitting increases the likelihood of GI side effects. Orlean requested metformin for now and a prescription was written today for 1 month refill. Crystalmarie agreed to follow up with [place] as directed to monitor her progress.  Diabetes risk counseling Rhanda was given extended (15 minutes) diabetes prevention counseling today. She is 27 y.o. female and has risk factors for diabetes including obesity and pre-diabetes. We discussed intensive lifestyle modifications today with an emphasis on weight loss as well as increasing exercise and decreasing simple carbohydrates in her diet.  Obesity Rayelle is currently in the action stage of change. As such, her goal is to continue with weight loss efforts She has agreed to keep a food journal with 1550 to 1700 calories and 90+ grams of protein daily or follow the Category 3 plan Coriann has been instructed to work up to a goal of 150 minutes of combined cardio and strengthening exercise per week for weight loss and overall health benefits. We discussed the following Behavioral Modification Strategies today: increase H2O intake, planning for success, keep a strict food journal,increasing lean protein intake, work on meal planning and easy cooking plans and travel eating strategies   Niobe has agreed to follow up with our clinic in 2 weeks. She was informed of the importance of frequent follow up visits to maximize her success with intensive lifestyle modifications for her multiple health conditions.   OBESITY BEHAVIORAL INTERVENTION VISIT  Today's visit was # 4 out of 22.  Starting weight: 447 lbs Starting date: 10/11/17 Today's weight : 420 lbs Today's date: 11/29/2017 Total lbs lost to date: 7 (Patients must lose 7 lbs  in the first  6 months to continue with counseling)   ASK: We discussed the diagnosis of obesity with [name] today and Kataya agreed to give [place] permission to discuss obesity behavioral modification therapy today.  ASSESS: Shavy has the diagnosis of obesity and her BMI today is 67.82 Ikran is in the action stage of change   ADVISE: Amauria was educated on the multiple health risks of obesity as well as the benefit of weight loss to improve her health. She was advised of the need for long term treatment and the importance of lifestyle modifications.  AGREE: Multiple dietary modification options and treatment options were discussed and  Rosmarie agreed to the above obesity treatment plan.  I, Doreene Nest, am acting as transcriptionist for Eber Jones, MD  I have reviewed the above documentation for accuracy and completeness, and I agree with the above. - Ilene Qua, MD

## 2017-12-04 ENCOUNTER — Encounter (INDEPENDENT_AMBULATORY_CARE_PROVIDER_SITE_OTHER): Payer: Self-pay | Admitting: Family Medicine

## 2017-12-11 ENCOUNTER — Encounter: Payer: Self-pay | Admitting: Adult Health

## 2017-12-11 ENCOUNTER — Ambulatory Visit (INDEPENDENT_AMBULATORY_CARE_PROVIDER_SITE_OTHER): Payer: Managed Care, Other (non HMO) | Admitting: Adult Health

## 2017-12-11 VITALS — BP 138/89 | HR 75 | Ht 66.0 in | Wt >= 6400 oz

## 2017-12-11 DIAGNOSIS — G43009 Migraine without aura, not intractable, without status migrainosus: Secondary | ICD-10-CM

## 2017-12-11 NOTE — Progress Notes (Signed)
PATIENT: Brandy Saunders DOB: 02-Nov-1990  REASON FOR VISIT: follow up HISTORY FROM: patient  HISTORY OF PRESENT ILLNESS: Today 12/11/17 Brandy Saunders is a 27 year old female with a history of migraine headaches.  She returns today for follow-up.  She states that her headaches are very infrequent.  She has approximately 1 migraine every 6 weeks.  She continues to use Imitrex with good benefit.  Her headaches typically occur in the right frontal region.  She describes her headache as a sharp stabbing pain.  She does have photophobia.  She also has nausea but no vomiting.  She returns today for an evaluation.    HISTORY 05/30/17 [name] is a 27 year old female with a history of migraine headaches. She returns today for follow-up. She states in the last month her headache frequency has decreased significantly. She states in the month of September she had only one headache. Her headache tends to occur on the right side although occasionally will switch over to the left side. She does have photophobia but denies phonophobia. She also has nausea but typically not vomiting. She denies any visual changes. She returns today for an evaluation.  REVIEW OF SYSTEMS: Out of a complete 14 system review of symptoms, the patient complains only of the following symptoms, and all other reviewed systems are negative.  Back pain, depression, nervous/ anxious  ALLERGIES: Allergies  Allergen Reactions  . Ibuprofen Diarrhea  . Metformin And Related Diarrhea    HOME MEDICATIONS: Outpatient Medications Prior to Visit  Medication Sig Dispense Refill  . acetaminophen (TYLENOL) 500 MG tablet Take 500 mg by mouth every 6 (six) hours as needed.    . Ascorbic Acid (VITAMIN C) 1000 MG tablet Take 1,000 mg by mouth daily.    Marland Kitchen BIOTIN 5000 PO Take 10,000 mg by mouth daily.     Marland Kitchen BLISOVI FE 1/20 1-20 MG-MCG tablet TAKE 1 TABLET DAILY 84 tablet 4  . cyanocobalamin 1000 MCG tablet Take 2,500 mcg by mouth daily.     . metFORMIN (GLUCOPHAGE) 500 MG tablet Take 1 tablet (500 mg total) by mouth daily with breakfast. 30 tablet 0  . metoprolol succinate (TOPROL XL) 50 MG 24 hr tablet Take 3 tab po qd  with or immediately following a meal. 270 tablet 2  . Multiple Vitamins-Calcium (ONE-A-DAY WOMENS PO) Take 1 tablet by mouth daily.     . SUMAtriptan (IMITREX) 100 MG tablet Take 1 tablet (100 mg total) by mouth 2 (two) times daily as needed for migraine. 10 tablet 11  . Vitamin D, Ergocalciferol, (DRISDOL) 50000 units CAPS capsule Take 1 capsule (50,000 Units total) by mouth every 7 (seven) days. 4 capsule 0   No facility-administered medications prior to visit.     PAST MEDICAL HISTORY: Past Medical History:  Diagnosis Date  . Anxiety   . Asthma   . Back pain   . Binge eating   . Carpal tunnel syndrome, bilateral   . Chest pain   . Common migraine with intractable migraine 02/02/2017  . Constipation   . Depression   . Dyspnea   . Fatty liver   . GERD (gastroesophageal reflux disease)   . Hypertension   . Joint pain   . Leg edema   . Morbidly obese ([place])   . Palpitations   . PCOS (polycystic ovarian syndrome)   . Prediabetes   . Vitamin D deficiency     PAST SURGICAL HISTORY: Past Surgical History:  Procedure Laterality Date  . TONSILECTOMY, ADENOIDECTOMY,  BILATERAL MYRINGOTOMY AND TUBES  1994    FAMILY HISTORY: Family History  Problem Relation Age of Onset  . Hypertension Mother   . Hyperlipidemia Mother   . Obesity Mother   . Stroke Father   . Obesity Father   . Alcoholism Father   . Drug abuse Father   . Diabetes Maternal Uncle   . Heart disease Maternal Grandmother   . Colon cancer Neg Hx     SOCIAL HISTORY: Social History   Socioeconomic History  . Marital status: Single    Spouse name: n/a  . Number of children: 0  . Years of education: 54  . Highest education level: Not on file  Occupational History  . Occupation: Public relations account executive  Social Needs  . Financial  resource strain: Not on file  . Food insecurity:    Worry: Not on file    Inability: Not on file  . Transportation needs:    Medical: Not on file    Non-medical: Not on file  Tobacco Use  . Smoking status: Former Smoker    Types: Cigarettes  . Smokeless tobacco: Never Used  Substance and Sexual Activity  . Alcohol use: Yes    Alcohol/week: 1.8 oz    Types: 3 Glasses of wine per week  . Drug use: No    Types: Marijuana    Comment: once every 6 months, taking CBD  . Sexual activity: Yes    Partners: Male    Birth control/protection: Condom, Pill    Comment: inconsistent condom use  Lifestyle  . Physical activity:    Days per week: Not on file    Minutes per session: Not on file  . Stress: Not on file  Relationships  . Social connections:    Talks on phone: Not on file    Gets together: Not on file    Attends religious service: Not on file    Active member of club or organization: Not on file    Attends meetings of clubs or organizations: Not on file    Relationship status: Not on file  . Intimate partner violence:    Fear of current or ex partner: Not on file    Emotionally abused: Not on file    Physically abused: Not on file    Forced sexual activity: Not on file  Other Topics Concern  . Not on file  Social History Narrative   Lives with a roommate.     Parents live in [place], [place].   Right handed   Caffeine use: coffee weekly      PHYSICAL EXAM  Vitals:   12/11/17 0801  BP: 138/89  Pulse: 75  Weight: (!) 427 lb (193.7 kg)  Height: 5\' 6"  (1.676 m)   Body mass index is 68.92 kg/m.  Generalized: Well developed, in no acute distress   Neurological examination  Mentation: Alert oriented to time, place, history taking. Follows all commands speech and language fluent Cranial nerve II-XII: Pupils were equal round reactive to light. Extraocular movements were full, visual field were full on confrontational test. Facial sensation and strength were normal.  Uvula tongue midline. Head turning and shoulder shrug  were normal and symmetric. Motor: The motor testing reveals 5 over 5 strength of all 4 extremities. Good symmetric motor tone is noted throughout.  Sensory: Sensory testing is intact to soft touch on all 4 extremities. No evidence of extinction is noted.  Coordination: Cerebellar testing reveals good finger-nose-finger and heel-to-shin bilaterally.  Gait and station:  Gait is normal. Tandem gait is normal. Romberg is negative. No drift is seen.  Reflexes: Deep tendon reflexes are symmetric and normal bilaterally.   DIAGNOSTIC DATA (LABS, IMAGING, TESTING) - I reviewed patient records, labs, notes, testing and imaging myself where available.  Lab Results  Component Value Date   WBC 4.7 10/11/2017   HGB 13.0 10/11/2017   HCT 39.9 10/11/2017   MCV 83 10/11/2017   PLT 263.0 06/12/2017      Component Value Date/Time   NA 137 10/11/2017 1022   K 4.9 10/11/2017 1022   CL 101 10/11/2017 1022   CO2 20 10/11/2017 1022   GLUCOSE 106 (H) 10/11/2017 1022   GLUCOSE 114 (H) 09/13/2017 0929   BUN 10 10/11/2017 1022   CREATININE 0.72 10/11/2017 1022   CREATININE 0.67 09/28/2014 1146   CALCIUM 9.0 10/11/2017 1022   PROT 6.7 10/11/2017 1022   ALBUMIN 4.1 10/11/2017 1022   AST 11 10/11/2017 1022   ALT 15 10/11/2017 1022   ALKPHOS 52 10/11/2017 1022   BILITOT 0.3 10/11/2017 1022   GFRNONAA 115 10/11/2017 1022   GFRAA 133 10/11/2017 1022   Lab Results  Component Value Date   CHOL 143 10/11/2017   HDL 53 10/11/2017   LDLCALC 74 10/11/2017   TRIG 78 10/11/2017   CHOLHDL 3 09/13/2017   Lab Results  Component Value Date   HGBA1C 6.0 (H) 10/11/2017   Lab Results  Component Value Date   VITAMINB12 1,152 10/11/2017   Lab Results  Component Value Date   TSH 1.880 10/11/2017      ASSESSMENT AND PLAN 27 y.o. year old female  has a past medical history of Anxiety, Asthma, Back pain, Binge eating, Carpal tunnel syndrome, bilateral,  Chest pain, Common migraine with intractable migraine (02/02/2017), Constipation, Depression, Dyspnea, Fatty liver, GERD (gastroesophageal reflux disease), Hypertension, Joint pain, Leg edema, Morbidly obese ([place]), Palpitations, PCOS (polycystic ovarian syndrome), Prediabetes, and Vitamin D deficiency. here with:  1.  Migraine headaches  Overall the patient is doing well.  She will continue to use Imitrex to treat her acute headaches.  She is advised that if her headache frequency increases she should let us know.  She will follow-up in 1 year or sooner if needed.   Ward Givens, MSN, NP-C 12/11/2017, 7:57 AM [facility] [address] [place], [place] [address] [phone]  I spent 15 minutes with the patient. 50% of this time was spent discussing migraine treatment

## 2017-12-11 NOTE — Patient Instructions (Signed)
Your Plan:  Continue imitrex if needed If your symptoms worsen or you develop new symptoms please let us know.   Thank you for coming to see [place] at [facility]. I hope we have been able to provide you high quality care today.  You may receive a patient satisfaction survey over the next few weeks. We would appreciate your feedback and comments so that we may continue to improve ourselves and the health of our patients.

## 2017-12-11 NOTE — Progress Notes (Signed)
I have read the note, and I agree with the clinical assessment and plan.  Ezekeil Bethel K Adalie Mand   

## 2017-12-13 ENCOUNTER — Encounter: Payer: Self-pay | Admitting: Family Medicine

## 2017-12-13 ENCOUNTER — Ambulatory Visit (HOSPITAL_BASED_OUTPATIENT_CLINIC_OR_DEPARTMENT_OTHER)
Admission: RE | Admit: 2017-12-13 | Discharge: 2017-12-13 | Disposition: A | Discharge status: Home / Self Care | Payer: Managed Care, Other (non HMO) | Source: Ambulatory Visit | Attending: Family Medicine | Admitting: Family Medicine

## 2017-12-13 ENCOUNTER — Encounter: Payer: Self-pay | Admitting: *Deleted

## 2017-12-13 ENCOUNTER — Ambulatory Visit (INDEPENDENT_AMBULATORY_CARE_PROVIDER_SITE_OTHER): Payer: Managed Care, Other (non HMO) | Admitting: Family Medicine

## 2017-12-13 VITALS — BP 100/60 | HR 74 | Temp 98.1°F | Resp 16 | Ht 66.0 in | Wt >= 6400 oz

## 2017-12-13 DIAGNOSIS — M79662 Pain in left lower leg: Secondary | ICD-10-CM

## 2017-12-13 DIAGNOSIS — I10 Essential (primary) hypertension: Secondary | ICD-10-CM

## 2017-12-13 MED ORDER — METOPROLOL SUCCINATE ER 50 MG PO TB24: ORAL_TABLET | ORAL | 2 refills | Status: DC

## 2017-12-13 NOTE — Patient Instructions (Signed)
Deep Vein Thrombosis Deep vein thrombosis (DVT) is a condition in which a blood clot forms in a deep vein, such as a lower leg, thigh, or arm vein. A clot is blood that has thickened into a gel or solid. This condition is dangerous. It can lead to serious and even life-threatening complications if the clot travels to the lungs and causes a blockage (pulmonary embolism). It can also damage veins in the leg. This can result in leg pain, swelling, discoloration, and sores (post-thrombotic syndrome). What are the causes? This condition may be caused by:  A slowdown of blood flow.  Damage to a vein.  A condition that makes blood clot more easily.  What increases the risk? The following factors may make you more likely to develop this condition:  Being overweight.  Being elderly, especially over age 60.  Sitting or lying down for more than four hours.  Lack of physical activity (sedentary lifestyle).  Being pregnant, giving birth, or having recently given birth.  Taking medicines that contain estrogen.  Smoking.  A history of any of the following: ? Blood clots or blood clotting disease. ? Peripheral vascular disease. ? Inflammatory bowel disease. ? Cancer. ? Heart disease. ? Genetic conditions that affect how blood clots. ? Neurological diseases that affect the legs (leg paresis). ? Injury. ? Major or lengthy surgery. ? A central line placed inside a large vein.  What are the signs or symptoms? Symptoms of this condition include:  Swelling, pain, or tenderness in an arm or leg.  Warmth, redness, or discoloration in an arm or leg.  If the clot is in your leg, symptoms may be more noticeable or worse when you stand or walk. Some people do not have any symptoms. How is this diagnosed? This condition is diagnosed with:  A medical history.  A physical exam.  Tests, such as: ? Blood tests. These are done to see how your blood clots. ? Imaging tests. These are done to  check for clots. Tests may include:  Ultrasound.  CT scan.  MRI.  X-ray.  Venogram. For this test, X-rays are taken after a dye is injected into a vein.  How is this treated? Treatment for this condition depends on the cause, your risk for bleeding or developing more clots, and any medical conditions you have. Treatment may include:  Taking blood thinners (also called anticoagulants). These medicines may be taken by mouth, injected under the skin, or injected through an IV tube (catheter). These medicines prevent clots from forming.  Injecting medicine that dissolves blood clots into the affected vein (catheter-directed thrombolysis).  Having surgery. Surgery may be done to: ? Remove the clot. ? Place a filter in a large vein to catch blood clots before they reach the lungs.  Some treatments may be continued for up to six months. Follow these instructions at home: If you are taking an oral blood thinner:  Take the medicine exactly as told by your health care provider. Some blood thinners need to be taken at the same time every day. Do not skip a dose.  Ask your health care provider about what foods and drugs interact with the medicine.  Ask about possible side effects. General instructions  Blood thinners can cause easy bruising and difficulty stopping bleeding. Because of this, if you are taking or were given a blood thinner: ? Hold pressure over cuts for longer than usual. ? Tell your dentist and other health care providers that you are taking blood thinners before   having any procedures that can cause bleeding. ? Avoid contact sports.  Take over-the-counter and prescription medicines only as told by your health care provider.  Return to your normal activities as told by your health care provider. Ask your health care provider what activities are safe for you.  Wear compression stockings if recommended by your health care provider.  Keep all follow-up visits as told by  your health care provider. This is important. How is this prevented? To lower your risk of developing this condition again:  For 30 or more minutes every day, do an activity that: ? Involves moving your arms and legs. ? Increases your heart rate.  When traveling for longer than four hours: ? Exercise your arms and legs every hour. ? Drink plenty of water. ? Avoid drinking alcohol.  Avoid sitting or lying for a long time without moving your legs.  Stay a healthy weight.  If you are a woman who is older than age 35, avoid unnecessary use of medicines that contain estrogen.  Do not use any products that contain nicotine or tobacco, such as cigarettes and e-cigarettes. This is especially important if you take estrogen medicines. If you need help quitting, ask your health care provider.  Contact a health care provider if:  You miss a dose of your blood thinner.  You have nausea, vomiting, or diarrhea that lasts for more than one day.  Your menstrual period is heavier than usual.  You have unusual bruising. Get help right away if:  You have new or increased pain, swelling, or redness in an arm or leg.  You have numbness or tingling in an arm or leg.  You have shortness of breath.  You have chest pain.  You have a rapid or irregular heartbeat.  You feel light-headed or dizzy.  You cough up blood.  There is blood in your vomit, stool, or urine.  You have a serious fall or accident, or you hit your head.  You have a severe headache or confusion.  You have a cut that will not stop bleeding. These symptoms may represent a serious problem that is an emergency. Do not wait to see if the symptoms will go away. Get medical help right away. Call your local emergency services (911 in the [place] Do not drive yourself to the hospital. Summary  DVT is a condition in which a blood clot forms in a deep vein, such as a lower leg, thigh, or arm vein.  Symptoms can include swelling,  warmth, pain, and redness in your leg or arm.  Treatment may include taking blood thinners, injecting medicine that dissolves blood clots,wearing compression stockings, or surgery.  If you are prescribed blood thinners, take them exactly as told. This information is not intended to replace advice given to you by your health care provider. Make sure you discuss any questions you have with your health care provider. Document Released: 08/14/2005 Document Revised: 09/16/2016 Document Reviewed: 09/16/2016 [employer] Interactive Patient Education  2018 [employer]  

## 2017-12-13 NOTE — Progress Notes (Signed)
Patient ID: Brandy Saunders, female   DOB: 1991/07/15, 27 y.o.   MRN: [id number]     Subjective:  I acted as a Education administrator for Dr. Carollee Herter.  Brandy Saunders, [place]   Patient ID: Brandy Saunders, female    DOB: Nov 15, 1990, 27 y.o.   MRN: [id number]  Chief Complaint  Patient presents with  . Hypertension  . Leg Pain    left calf    HPI  Patient is in today for follow up blood pressure and left calf pain.  She has had calf pain since a trip to [place].  She walked a lot and drove there.  Pain is in L calf   Patient Care Team: Carollee Herter, Alferd Apa, DO as PCP - General (Family Medicine)   Past Medical History:  Diagnosis Date  . Anxiety   . Asthma   . Back pain   . Binge eating   . Carpal tunnel syndrome, bilateral   . Chest pain   . Common migraine with intractable migraine 02/02/2017  . Constipation   . Depression   . Dyspnea   . Fatty liver   . GERD (gastroesophageal reflux disease)   . Hypertension   . Joint pain   . Leg edema   . Morbidly obese ([place])   . Palpitations   . PCOS (polycystic ovarian syndrome)   . Prediabetes   . Vitamin D deficiency     Past Surgical History:  Procedure Laterality Date  . TONSILECTOMY, ADENOIDECTOMY, BILATERAL MYRINGOTOMY AND TUBES  1994    Family History  Problem Relation Age of Onset  . Hypertension Mother   . Hyperlipidemia Mother   . Obesity Mother   . Stroke Father   . Obesity Father   . Alcoholism Father   . Drug abuse Father   . Diabetes Maternal Uncle   . Heart disease Maternal Grandmother   . Colon cancer Neg Hx     Social History   Socioeconomic History  . Marital status: Single    Spouse name: n/a  . Number of children: 0  . Years of education: 45  . Highest education level: Not on file  Occupational History  . Occupation: Public relations account executive  Social Needs  . Financial resource strain: Not on file  . Food insecurity:    Worry: Not on file    Inability: Not on file  . Transportation needs:    Medical: Not  on file    Non-medical: Not on file  Tobacco Use  . Smoking status: Former Smoker    Types: Cigarettes  . Smokeless tobacco: Never Used  Substance and Sexual Activity  . Alcohol use: Yes    Alcohol/week: 1.8 oz    Types: 3 Glasses of wine per week  . Drug use: No    Types: Marijuana    Comment: once every 6 months, taking CBD  . Sexual activity: Yes    Partners: Male    Birth control/protection: Condom, Pill    Comment: inconsistent condom use  Lifestyle  . Physical activity:    Days per week: Not on file    Minutes per session: Not on file  . Stress: Not on file  Relationships  . Social connections:    Talks on phone: Not on file    Gets together: Not on file    Attends religious service: Not on file    Active member of club or organization: Not on file    Attends meetings of clubs or organizations: Not  on file    Relationship status: Not on file  . Intimate partner violence:    Fear of current or ex partner: Not on file    Emotionally abused: Not on file    Physically abused: Not on file    Forced sexual activity: Not on file  Other Topics Concern  . Not on file  Social History Narrative   Lives with a roommate.     Parents live in [place], [place].   Right handed   Caffeine use: coffee weekly    Outpatient Medications Prior to Visit  Medication Sig Dispense Refill  . acetaminophen (TYLENOL) 500 MG tablet Take 500 mg by mouth every 6 (six) hours as needed.    . Ascorbic Acid (VITAMIN C) 1000 MG tablet Take 1,000 mg by mouth daily.    Marland Kitchen BIOTIN 5000 PO Take 10,000 mg by mouth daily.     Marland Kitchen BLISOVI FE 1/20 1-20 MG-MCG tablet TAKE 1 TABLET DAILY 84 tablet 4  . Cyanocobalamin (VITAMIN B 12 PO) Take 2,500 mcg by mouth daily.    . metFORMIN (GLUCOPHAGE) 500 MG tablet Take 1 tablet (500 mg total) by mouth daily with breakfast. 30 tablet 0  . Multiple Vitamins-Calcium (ONE-A-DAY WOMENS PO) Take 1 tablet by mouth daily.     . SUMAtriptan (IMITREX) 100 MG tablet Take 1 tablet  (100 mg total) by mouth 2 (two) times daily as needed for migraine. 10 tablet 11  . Vitamin D, Ergocalciferol, (DRISDOL) 50000 units CAPS capsule Take 1 capsule (50,000 Units total) by mouth every 7 (seven) days. 4 capsule 0  . metoprolol succinate (TOPROL XL) 50 MG 24 hr tablet Take 3 tab po qd  with or immediately following a meal. 270 tablet 2   No facility-administered medications prior to visit.     Allergies  Allergen Reactions  . Ibuprofen Diarrhea  . Metformin And Related Diarrhea    Review of Systems  Constitutional: Negative for fever and malaise/fatigue.  HENT: Negative for congestion.   Eyes: Negative for blurred vision.  Respiratory: Negative for cough and shortness of breath.   Cardiovascular: Negative for chest pain, palpitations and leg swelling.  Gastrointestinal: Negative for vomiting.  Musculoskeletal: Negative for back pain.       Left calf pain  Skin: Negative for rash.  Neurological: Negative for loss of consciousness and headaches.       Objective:    Physical Exam  Constitutional: She is oriented to person, place, and time. She appears well-developed and well-nourished.  HENT:  Head: Normocephalic and atraumatic.  Eyes: Conjunctivae and EOM are normal.  Neck: Normal range of motion. Neck supple. No JVD present. Carotid bruit is not present. No thyromegaly present.  Cardiovascular: Normal rate, regular rhythm and normal heart sounds.  No murmur heard. Pulmonary/Chest: Effort normal and breath sounds normal. No respiratory distress. She has no wheezes. She has no rales. She exhibits no tenderness.  Musculoskeletal: She exhibits edema.       Left lower leg: She exhibits tenderness and swelling.  Neurological: She is alert and oriented to person, place, and time.  Psychiatric: She has a normal mood and affect.  Nursing note and vitals reviewed.   BP 100/60 (BP Location: Right Arm, Cuff Size: Normal)   Pulse 74   Temp 98.1 F (36.7 C) (Oral)   Resp  16   Ht 5\' 6"  (1.676 m)   Wt (!) 423 lb (191.9 kg)   LMP 12/12/2017   SpO2 98%   BMI  68.27 kg/m  Wt Readings from Last 3 Encounters:  12/17/17 215 lb (97.5 kg)  12/13/17 (!) 423 lb (191.9 kg)  12/11/17 (!) 427 lb (193.7 kg)   BP Readings from Last 3 Encounters:  12/17/17 114/74  12/13/17 100/60  12/11/17 138/89     Immunization History  Administered Date(s) Administered  . Td 07/30/2008    Health Maintenance  Topic Date Due  . INFLUENZA VACCINE  05/09/2018 (Originally 03/28/2018)  . TETANUS/TDAP  07/30/2018  . PAP SMEAR  04/12/2019  . HIV Screening  Completed    Lab Results  Component Value Date   WBC 4.7 10/11/2017   HGB 13.0 10/11/2017   HCT 39.9 10/11/2017   PLT 263.0 06/12/2017   GLUCOSE 106 (H) 10/11/2017   CHOL 143 10/11/2017   TRIG 78 10/11/2017   HDL 53 10/11/2017   LDLCALC 74 10/11/2017   ALT 15 10/11/2017   AST 11 10/11/2017   NA 137 10/11/2017   K 4.9 10/11/2017   CL 101 10/11/2017   CREATININE 0.72 10/11/2017   BUN 10 10/11/2017   CO2 20 10/11/2017   TSH 1.880 10/11/2017   HGBA1C 6.0 (H) 10/11/2017    Lab Results  Component Value Date   TSH 1.880 10/11/2017   Lab Results  Component Value Date   WBC 4.7 10/11/2017   HGB 13.0 10/11/2017   HCT 39.9 10/11/2017   MCV 83 10/11/2017   PLT 263.0 06/12/2017   Lab Results  Component Value Date   NA 137 10/11/2017   K 4.9 10/11/2017   CO2 20 10/11/2017   GLUCOSE 106 (H) 10/11/2017   BUN 10 10/11/2017   CREATININE 0.72 10/11/2017   BILITOT 0.3 10/11/2017   ALKPHOS 52 10/11/2017   AST 11 10/11/2017   ALT 15 10/11/2017   PROT 6.7 10/11/2017   ALBUMIN 4.1 10/11/2017   CALCIUM 9.0 10/11/2017   ANIONGAP 9 02/22/2017   GFR 128.66 09/13/2017   Lab Results  Component Value Date   CHOL 143 10/11/2017   Lab Results  Component Value Date   HDL 53 10/11/2017   Lab Results  Component Value Date   LDLCALC 74 10/11/2017   Lab Results  Component Value Date   TRIG 78 10/11/2017   Lab  Results  Component Value Date   CHOLHDL 3 09/13/2017   Lab Results  Component Value Date   HGBA1C 6.0 (H) 10/11/2017         Assessment & Plan:   Problem List Items Addressed This Visit      Unprioritized   Essential hypertension    Dec metoprolol to 100 mg daily Pt has f/u with healthy weight and wellness  con't with diet and exercise      Relevant Medications   metoprolol succinate (TOPROL XL) 50 MG 24 hr tablet   Pain of left calf - Primary    Check doppler r/o dvt Elevate       Relevant Orders   US Venous Img Lower Unilateral Left (Completed)      I have changed Sarena L. Campbell's metoprolol succinate. I am also having her maintain her Multiple Vitamins-Calcium (ONE-A-DAY WOMENS PO), BIOTIN 5000 PO, vitamin C, BLISOVI FE 1/20, SUMAtriptan, acetaminophen, Vitamin D (Ergocalciferol), metFORMIN, and Cyanocobalamin (VITAMIN B 12 PO).  Meds ordered this encounter  Medications  . metoprolol succinate (TOPROL XL) 50 MG 24 hr tablet    Sig: Take 2 tab po qd  with or immediately following a meal.    Dispense:  180 tablet  Refill:  2    CMA served as Education administrator during this visit. History, Physical and Plan performed by medical provider. Documentation and orders reviewed and attested to.  Ann Held, DO

## 2017-12-14 DIAGNOSIS — M79662 Pain in left lower leg: Secondary | ICD-10-CM | POA: Insufficient documentation

## 2017-12-14 NOTE — Assessment & Plan Note (Signed)
Dec metoprolol to 100 mg daily Pt has f/u with healthy weight and wellness  con't with diet and exercise

## 2017-12-14 NOTE — Assessment & Plan Note (Signed)
Check doppler r/o dvt Elevate

## 2017-12-17 ENCOUNTER — Ambulatory Visit (INDEPENDENT_AMBULATORY_CARE_PROVIDER_SITE_OTHER): Payer: Managed Care, Other (non HMO) | Admitting: Family Medicine

## 2017-12-17 VITALS — BP 114/74 | HR 71 | Temp 97.8°F | Ht 66.0 in | Wt >= 6400 oz

## 2017-12-17 DIAGNOSIS — I10 Essential (primary) hypertension: Secondary | ICD-10-CM | POA: Diagnosis not present

## 2017-12-17 DIAGNOSIS — R7303 Prediabetes: Secondary | ICD-10-CM | POA: Diagnosis not present

## 2017-12-17 DIAGNOSIS — E559 Vitamin D deficiency, unspecified: Secondary | ICD-10-CM | POA: Diagnosis not present

## 2017-12-17 DIAGNOSIS — Z6841 Body Mass Index (BMI) 40.0 and over, adult: Secondary | ICD-10-CM | POA: Diagnosis not present

## 2017-12-18 NOTE — Progress Notes (Signed)
Office: [phone]  /  Fax: [phone]   HPI:   Chief Complaint: OBESITY Brandy Saunders is here to discuss her progress with her obesity treatment plan. Brandy Saunders is on the Category 2 plan and is following her eating plan approximately 50 % of the time. Brandy Saunders states Brandy Saunders is exercising 0 minutes 0 times per week. Brandy Saunders enjoyed going out to eat in [place]. Brandy Saunders journaled most of the day. Brandy Saunders found cravings increased after returning from [facility]. Her weight is 215 lb (97.5 kg) today and has had a weight loss of 5 pounds over a period of 2 to 3 weeks since her last visit. Brandy Saunders has lost 32 lbs since starting treatment with [place].  Vitamin D deficiency Brandy Saunders has a diagnosis of vitamin D deficiency. Brandy Saunders is currently taking vit D and denies nausea, vomiting or muscle weakness.  Pre-Diabetes Brandy Saunders has a diagnosis of pre-diabetes based on her elevated Hgb A1c and was informed this puts her at greater risk of developing diabetes. Brandy Saunders is taking metformin currently and continues to work on diet and exercise to decrease risk of diabetes. Brandy Saunders noticed increased carb cravings after indulging in [place] and Brandy Saunders denies nausea or hypoglycemia.  Hypertension Brandy Saunders is a 27 y.o. female with hypertension. Brandy Saunders decreased medication to 100 mg by mouth every 24 hours. Brandy Saunders denies chest pain or shortness of breath on exertion. Brandy Saunders is working weight loss to help control her blood pressure with the goal of decreasing her risk of heart attack and stroke. Brandy Saunders blood pressure is currently controlled.  ALLERGIES: Allergies  Allergen Reactions  . Ibuprofen Diarrhea  . Metformin And Related Diarrhea    MEDICATIONS: Current Outpatient Medications on File Prior to Visit  Medication Sig Dispense Refill  . acetaminophen (TYLENOL) 500 MG tablet Take 500 mg by mouth every 6 (six) hours as needed.    . Ascorbic Acid (VITAMIN C) 1000 MG tablet Take 1,000 mg by mouth daily.    Marland Kitchen BIOTIN 5000 PO Take 10,000  mg by mouth daily.     Marland Kitchen BLISOVI FE 1/20 1-20 MG-MCG tablet TAKE 1 TABLET DAILY 84 tablet 4  . Cyanocobalamin (VITAMIN B 12 PO) Take 2,500 mcg by mouth daily.    . metFORMIN (GLUCOPHAGE) 500 MG tablet Take 1 tablet (500 mg total) by mouth daily with breakfast. 30 tablet 0  . metoprolol succinate (TOPROL XL) 50 MG 24 hr tablet Take 2 tab po qd  with or immediately following a meal. 180 tablet 2  . Multiple Vitamins-Calcium (ONE-A-DAY WOMENS PO) Take 1 tablet by mouth daily.     . SUMAtriptan (IMITREX) 100 MG tablet Take 1 tablet (100 mg total) by mouth 2 (two) times daily as needed for migraine. 10 tablet 11  . Vitamin D, Ergocalciferol, (DRISDOL) 50000 units CAPS capsule Take 1 capsule (50,000 Units total) by mouth every 7 (seven) days. 4 capsule 0   No current facility-administered medications on file prior to visit.     PAST MEDICAL HISTORY: Past Medical History:  Diagnosis Date  . Anxiety   . Asthma   . Back pain   . Binge eating   . Carpal tunnel syndrome, bilateral   . Chest pain   . Common migraine with intractable migraine 02/02/2017  . Constipation   . Depression   . Dyspnea   . Fatty liver   . GERD (gastroesophageal reflux disease)   . Hypertension   . Joint pain   . Leg edema   . Morbidly obese (  [facility])   . Palpitations   . PCOS (polycystic ovarian syndrome)   . Prediabetes   . Vitamin D deficiency     PAST SURGICAL HISTORY: Past Surgical History:  Procedure Laterality Date  . TONSILECTOMY, ADENOIDECTOMY, BILATERAL MYRINGOTOMY AND TUBES  1994    SOCIAL HISTORY: Social History   Tobacco Use  . Smoking status: Former Smoker    Types: Cigarettes  . Smokeless tobacco: Never Used  Substance Use Topics  . Alcohol use: Yes    Alcohol/week: 1.8 oz    Types: 3 Glasses of wine per week  . Drug use: No    Types: Marijuana    Comment: once every 6 months, taking CBD    FAMILY HISTORY: Family History  Problem Relation Age of Onset  . Hypertension Mother   .  Hyperlipidemia Mother   . Obesity Mother   . Stroke Father   . Obesity Father   . Alcoholism Father   . Drug abuse Father   . Diabetes Maternal Uncle   . Heart disease Maternal Grandmother   . Colon cancer Neg Hx     ROS: Review of Systems  Constitutional: Positive for weight loss.  Respiratory: Negative for shortness of breath (on exertion).   Cardiovascular: Negative for chest pain.  Gastrointestinal: Negative for nausea and vomiting.  Musculoskeletal:       Negative for muscle weakness  Endo/Heme/Allergies:       Negative for hypoglycemia    PHYSICAL EXAM: Blood pressure 114/74, pulse 71, temperature 97.8 F (36.6 C), temperature source Oral, height 5\' 6"  (1.676 m), weight 215 lb (97.5 kg), last menstrual period 12/12/2017, SpO2 99 %. Body mass index is 34.7 kg/m. Physical Exam  Constitutional: Brandy Saunders is oriented to person, place, and time. Brandy Saunders appears well-developed and well-nourished.  Cardiovascular: Normal rate.  Pulmonary/Chest: Effort normal.  Musculoskeletal: Normal range of motion.  Neurological: Brandy Saunders is oriented to person, place, and time.  Skin: Skin is warm and dry.  Psychiatric: Brandy Saunders has a normal mood and affect. Her behavior is normal.  Vitals reviewed.   RECENT LABS AND TESTS: BMET    Component Value Date/Time   NA 137 10/11/2017 1022   K 4.9 10/11/2017 1022   CL 101 10/11/2017 1022   CO2 20 10/11/2017 1022   GLUCOSE 106 (H) 10/11/2017 1022   GLUCOSE 114 (H) 09/13/2017 0929   BUN 10 10/11/2017 1022   CREATININE 0.72 10/11/2017 1022   CREATININE 0.67 09/28/2014 1146   CALCIUM 9.0 10/11/2017 1022   GFRNONAA 115 10/11/2017 1022   GFRAA 133 10/11/2017 1022   Lab Results  Component Value Date   HGBA1C 6.0 (H) 10/11/2017   HGBA1C 6.1 09/13/2017   HGBA1C 5.9 06/12/2017   HGBA1C 5.8 01/07/2016   HGBA1C 6.3 10/05/2014   Lab Results  Component Value Date   INSULIN 73.6 (H) 10/11/2017   CBC    Component Value Date/Time   WBC 4.7 10/11/2017 1022     WBC 4.9 06/12/2017 1344   RBC 4.79 10/11/2017 1022   RBC 4.97 06/12/2017 1344   HGB 13.0 10/11/2017 1022   HCT 39.9 10/11/2017 1022   PLT 263.0 06/12/2017 1344   MCV 83 10/11/2017 1022   MCH 27.1 10/11/2017 1022   MCH 24.5 (L) 09/28/2014 1146   MCHC 32.6 10/11/2017 1022   MCHC 32.5 06/12/2017 1344   RDW 13.8 10/11/2017 1022   LYMPHSABS 1.7 10/11/2017 1022   MONOABS 0.2 06/12/2017 1344   EOSABS 0.1 10/11/2017 1022   BASOSABS 0.0  10/11/2017 1022   Iron/TIBC/Ferritin/ %Sat No results found for: IRON, TIBC, FERRITIN, IRONPCTSAT Lipid Panel     Component Value Date/Time   CHOL 143 10/11/2017 1022   TRIG 78 10/11/2017 1022   HDL 53 10/11/2017 1022   CHOLHDL 3 09/13/2017 0929   VLDL 16.4 09/13/2017 0929   LDLCALC 74 10/11/2017 1022   Hepatic Function Panel     Component Value Date/Time   PROT 6.7 10/11/2017 1022   ALBUMIN 4.1 10/11/2017 1022   AST 11 10/11/2017 1022   ALT 15 10/11/2017 1022   ALKPHOS 52 10/11/2017 1022   BILITOT 0.3 10/11/2017 1022   BILIDIR 0.1 09/28/2014 1146   IBILI 0.4 09/28/2014 1146      Component Value Date/Time   TSH 1.880 10/11/2017 1022   TSH 1.98 06/12/2017 1344   TSH 1.49 04/11/2016 1627   Results for JANIELLE, MITTELSTADT (MRN [id number]) as of 12/18/2017 08:31  Ref. Range 10/11/2017 10:22  Vitamin D, 25-Hydroxy Latest Ref Range: 30.0 - 100.0 ng/mL 30.5   ASSESSMENT AND PLAN: Pre-diabetes  Vitamin D deficiency  Essential hypertension  Class 3 severe obesity with serious comorbidity and body mass index (BMI) of 60.0 to 69.9 in adult, unspecified obesity type ([place])  PLAN:  Vitamin D Deficiency Brandy Saunders was informed that low vitamin D levels contributes to fatigue and are associated with obesity, breast, and colon cancer. Brandy Saunders agrees to continue to take prescription Vit D @50 ,000 IU every week (no refill needed) and will follow up for routine testing of vitamin D, at least 2-3 times per year. Brandy Saunders was informed of the risk of  over-replacement of vitamin D and agrees to not increase her dose unless Brandy Saunders discusses this with [place] first.  Pre-Diabetes Brandy Saunders will continue to work on weight loss, exercise, and decreasing simple carbohydrates in her diet to help decrease the risk of diabetes. We dicussed metformin including benefits and risks. Brandy Saunders was informed that eating too many simple carbohydrates or too many calories at one sitting increases the likelihood of GI side effects. Brandy Saunders will continue metformin 500 mg daily (no refill needed). Brandy Saunders agreed to follow up with [place] as directed to monitor her progress.  Hypertension We discussed sodium restriction, working on healthy weight loss, and a regular exercise program as the means to achieve improved blood pressure control. Brandy Saunders agreed with this plan and agreed to follow up as directed. We will continue to monitor her blood pressure as well as her progress with the above lifestyle modifications. Brandy Saunders will continue metoprolol 100 mg by mouth every 24 hours and will watch for signs of hypotension as Brandy Saunders continues her lifestyle modifications.  We spent > than 50% of the 15 minute visit on the counseling as documented in the note.  Obesity Brandy Saunders is currently in the action stage of change. As such, her goal is to continue with weight loss efforts Brandy Saunders has agreed to keep a food journal with 1600 to 1700 calories and 90 grams of protein daily and/or follow the Category 3 plan Brandy Saunders has been instructed to work up to a goal of 150 minutes of combined cardio and strengthening exercise per week for weight loss and overall health benefits. Brandy Saunders is to start Zumba 2 times per week We discussed the following Behavioral Modification Strategies today: increase H2O intake, planning for success, increasing lean protein intake, increasing vegetables and work on meal planning and easy cooking plans  Brandy Saunders has agreed to follow up with our clinic in 2 weeks. Brandy Saunders was informed  of  the importance of frequent follow up visits to maximize her success with intensive lifestyle modifications for her multiple health conditions.   OBESITY BEHAVIORAL INTERVENTION VISIT  Today's visit was # 5 out of 22.  Starting weight: 447 lbs Starting date: 10/11/17 Today's weight : 415 lbs Today's date: 12/17/2017 Total lbs lost to date: 19 (Patients must lose 7 lbs in the first 6 months to continue with counseling)   ASK: We discussed the diagnosis of obesity with Brandy Saunders today and Brandy Saunders agreed to give [place] permission to discuss obesity behavioral modification therapy today.  ASSESS: Brandy Saunders has the diagnosis of obesity and her BMI today is 67.1 Brandy Saunders is in the action stage of change   ADVISE: Brandy Saunders was educated on the multiple health risks of obesity as well as the benefit of weight loss to improve her health. Brandy Saunders was advised of the need for long term treatment and the importance of lifestyle modifications.  AGREE: Multiple dietary modification options and treatment options were discussed and  Brandy Saunders agreed to the above obesity treatment plan.  I, Doreene Nest, am acting as transcriptionist for Eber Jones, MD  I have reviewed the above documentation for accuracy and completeness, and I agree with the above. - Ilene Qua, MD

## 2018-01-01 ENCOUNTER — Encounter (HOSPITAL_COMMUNITY): Payer: Self-pay

## 2018-01-01 ENCOUNTER — Emergency Department (HOSPITAL_COMMUNITY): Payer: Worker's Compensation

## 2018-01-01 ENCOUNTER — Other Ambulatory Visit: Payer: Self-pay

## 2018-01-01 ENCOUNTER — Ambulatory Visit (INDEPENDENT_AMBULATORY_CARE_PROVIDER_SITE_OTHER): Payer: Managed Care, Other (non HMO) | Admitting: Family Medicine

## 2018-01-01 ENCOUNTER — Emergency Department (HOSPITAL_COMMUNITY)
Admission: EM | Admit: 2018-01-01 | Discharge: 2018-01-02 | Disposition: A | Discharge status: Home / Self Care | Payer: Worker's Compensation | Attending: Emergency Medicine | Admitting: Emergency Medicine

## 2018-01-01 VITALS — BP 116/74 | HR 72 | Temp 98.2°F | Ht 66.0 in | Wt >= 6400 oz

## 2018-01-01 DIAGNOSIS — R7303 Prediabetes: Secondary | ICD-10-CM

## 2018-01-01 DIAGNOSIS — Y929 Unspecified place or not applicable: Secondary | ICD-10-CM | POA: Diagnosis not present

## 2018-01-01 DIAGNOSIS — I10 Essential (primary) hypertension: Secondary | ICD-10-CM | POA: Insufficient documentation

## 2018-01-01 DIAGNOSIS — W228XXA Striking against or struck by other objects, initial encounter: Secondary | ICD-10-CM | POA: Diagnosis not present

## 2018-01-01 DIAGNOSIS — Z87891 Personal history of nicotine dependence: Secondary | ICD-10-CM | POA: Diagnosis not present

## 2018-01-01 DIAGNOSIS — Z6841 Body Mass Index (BMI) 40.0 and over, adult: Secondary | ICD-10-CM | POA: Diagnosis not present

## 2018-01-01 DIAGNOSIS — Z9189 Other specified personal risk factors, not elsewhere classified: Secondary | ICD-10-CM | POA: Diagnosis not present

## 2018-01-01 DIAGNOSIS — Z7984 Long term (current) use of oral hypoglycemic drugs: Secondary | ICD-10-CM | POA: Diagnosis not present

## 2018-01-01 DIAGNOSIS — J45909 Unspecified asthma, uncomplicated: Secondary | ICD-10-CM | POA: Insufficient documentation

## 2018-01-01 DIAGNOSIS — E559 Vitamin D deficiency, unspecified: Secondary | ICD-10-CM

## 2018-01-01 DIAGNOSIS — Z79899 Other long term (current) drug therapy: Secondary | ICD-10-CM | POA: Insufficient documentation

## 2018-01-01 DIAGNOSIS — S098XXA Other specified injuries of head, initial encounter: Secondary | ICD-10-CM | POA: Insufficient documentation

## 2018-01-01 DIAGNOSIS — Y990 Civilian activity done for income or pay: Secondary | ICD-10-CM | POA: Diagnosis not present

## 2018-01-01 DIAGNOSIS — S0990XA Unspecified injury of head, initial encounter: Secondary | ICD-10-CM

## 2018-01-01 DIAGNOSIS — Y939 Activity, unspecified: Secondary | ICD-10-CM | POA: Insufficient documentation

## 2018-01-01 MED ORDER — VITAMIN D (ERGOCALCIFEROL) 1.25 MG (50000 UNIT) PO CAPS: 50000.0000 [IU] | ORAL_CAPSULE | ORAL | 0 refills | Status: DC

## 2018-01-01 MED ORDER — ACETAMINOPHEN 325 MG PO TABS
650.0000 mg | ORAL_TABLET | Freq: Once | ORAL | Status: AC
  Administered 2018-01-01: 650 mg via ORAL
  Filled 2018-01-01: qty 2

## 2018-01-01 MED ORDER — METFORMIN HCL 500 MG PO TABS: 500.0000 mg | ORAL_TABLET | Freq: Every day | ORAL | 0 refills | Status: DC

## 2018-01-01 NOTE — ED Triage Notes (Signed)
Pt presents with pain to top of head after striking head on metal key safe while at work today.  Pt denies any LOC, no blood noted, reports feeling dizzy since injury.  Pt denies any neck pain.

## 2018-01-01 NOTE — ED Provider Notes (Signed)
[facility] DEPART[facility] Provider Note   CSN: [id number] Arrival date & time: 01/01/18  2123     History   Chief Complaint Chief Complaint  Patient presents with  . Head Injury    HPI Brandy Saunders is a 27 y.o. female with a past medical history of hypertension, PCOS, presents to ED for evaluation of head injury that occurred approximately 2 hours prior to arrival.  She was at work when she hit her head on the door open safety.  She reports headache, numbness, dizziness and feeling like "I am jumbling up my words."  She has not taken any medications prior to arrival to help with symptoms.  She denies any loss of consciousness, neck pain, vision changes, vomiting, fever.  HPI  Past Medical History:  Diagnosis Date  . Anxiety   . Asthma   . Back pain   . Binge eating   . Carpal tunnel syndrome, bilateral   . Chest pain   . Common migraine with intractable migraine 02/02/2017  . Constipation   . Depression   . Dyspnea   . Fatty liver   . GERD (gastroesophageal reflux disease)   . Hypertension   . Joint pain   . Leg edema   . Morbidly obese ([place])   . Palpitations   . PCOS (polycystic ovarian syndrome)   . Prediabetes   . Vitamin D deficiency     Patient Active Problem List   Diagnosis Date Noted  . Pain of left calf 12/14/2017  . Other fatigue 10/11/2017  . Shortness of breath on exertion 10/11/2017  . Prediabetes 10/11/2017  . Chronic left-sided thoracic back pain 05/23/2017  . Acute left-sided thoracic back pain 05/09/2017  . Migraine 05/09/2017  . Essential hypertension 05/09/2017  . Common migraine with intractable migraine 02/02/2017  . Chronic fatigue 01/06/2016  . Fatty liver 02/15/2015  . Esophageal reflux 02/15/2015  . Late menses 07/30/2014  . Morbid obesity ([place]) 07/30/2014  . Asthma without acute exacerbation 07/30/2014  . IBS (irritable bowel syndrome) 07/30/2014  . Elevated BP 07/30/2014  . PCOS (polycystic ovarian  syndrome) 11/25/2012  . Carpal tunnel syndrome, bilateral     Past Surgical History:  Procedure Laterality Date  . TONSILECTOMY, ADENOIDECTOMY, BILATERAL MYRINGOTOMY AND TUBES  1994     OB History    Gravida  0   Para  0   Term  0   Preterm  0   AB  0   Living  0     SAB  0   TAB  0   Ectopic  0   Multiple  0   Live Births  0            Home Medications    Prior to Admission medications   Medication Sig Start Date End Date Taking? Authorizing Provider  acetaminophen (TYLENOL) 500 MG tablet Take 500 mg by mouth every 6 (six) hours as needed.    [provider]  Ascorbic Acid (VITAMIN C) 1000 MG tablet Take 1,000 mg by mouth daily.    [provider]  BIOTIN 5000 PO Take 10,000 mg by mouth daily.     [provider]  BLISOVI FE 1/20 1-20 MG-MCG tablet TAKE 1 TABLET DAILY 03/09/17   Carollee Herter, Alferd Apa, DO  Cyanocobalamin (VITAMIN B 12 PO) Take 2,500 mcg by mouth daily.    [provider]  metFORMIN (GLUCOPHAGE) 500 MG tablet Take 1 tablet (500 mg total) by mouth daily  with breakfast. 01/01/18   Eber Jones, MD  metoprolol succinate (TOPROL XL) 50 MG 24 hr tablet Take 2 tab po qd  with or immediately following a meal. 12/13/17   Carollee Herter, Alferd Apa, DO  Multiple Vitamins-Calcium (ONE-A-DAY WOMENS PO) Take 1 tablet by mouth daily.     [provider]  SUMAtriptan (IMITREX) 100 MG tablet Take 1 tablet (100 mg total) by mouth 2 (two) times daily as needed for migraine. 05/30/17   Ward Givens, NP  Vitamin D, Ergocalciferol, (DRISDOL) 50000 units CAPS capsule Take 1 capsule (50,000 Units total) by mouth every 7 (seven) days. 01/01/18   Eber Jones, MD    Family History Family History  Problem Relation Age of Onset  . Hypertension Mother   . Hyperlipidemia Mother   . Obesity Mother   . Stroke Father   . Obesity Father   . Alcoholism Father   . Drug abuse Father   . Diabetes Maternal Uncle   .  Heart disease Maternal Grandmother   . Colon cancer Neg Hx     Social History Social History   Tobacco Use  . Smoking status: Former Smoker    Types: Cigarettes  . Smokeless tobacco: Never Used  Substance Use Topics  . Alcohol use: Yes    Alcohol/week: 1.8 oz    Types: 3 Glasses of wine per week  . Drug use: No    Types: Marijuana    Comment: once every 6 months, taking CBD     Allergies   Ibuprofen and Metformin and related   Review of Systems Review of Systems  Constitutional: Negative for appetite change, chills and fever.  HENT: Negative for ear pain, rhinorrhea, sneezing and sore throat.   Eyes: Negative for photophobia and visual disturbance.  Respiratory: Negative for cough, chest tightness, shortness of breath and wheezing.   Cardiovascular: Negative for chest pain and palpitations.  Gastrointestinal: Negative for abdominal pain, blood in stool, constipation, diarrhea, nausea and vomiting.  Genitourinary: Negative for dysuria, hematuria and urgency.  Musculoskeletal: Negative for myalgias.  Skin: Negative for rash.  Neurological: Positive for light-headedness and headaches. Negative for dizziness and weakness.     Physical Exam Updated Vital Signs BP (!) 173/80 (BP Location: Right Arm)   Pulse 83   Temp 98 F (36.7 C) (Oral)   Resp 18   LMP 12/12/2017   SpO2 100%   Physical Exam  Constitutional: She is oriented to person, place, and time. She appears well-developed and well-nourished. No distress.  HENT:  Head: Normocephalic and atraumatic.  Nose: Nose normal.  Eyes: Pupils are equal, round, and reactive to light. Conjunctivae and EOM are normal. Right eye exhibits no discharge. Left eye exhibits no discharge. No scleral icterus.  Neck: Normal range of motion. Neck supple.  Cardiovascular: Normal rate, regular rhythm, normal heart sounds and intact distal pulses. Exam reveals no gallop and no friction rub.  No murmur heard. Pulmonary/Chest: Effort  normal and breath sounds normal. No respiratory distress.  Abdominal: Soft. Bowel sounds are normal. She exhibits no distension. There is no tenderness. There is no guarding.  Musculoskeletal: Normal range of motion. She exhibits no edema.  Neurological: She is alert and oriented to person, place, and time. No cranial nerve deficit or sensory deficit. She exhibits normal muscle tone. Coordination normal.  Pupils reactive. No facial asymmetry noted. Cranial nerves appear grossly intact. Sensation intact to light touch on face, BUE and BLE. Strength 5/5 in BUE and BLE.  Skin:  Skin is warm and dry. No rash noted.  No wounds noted.  Psychiatric: She has a normal mood and affect.  Nursing note and vitals reviewed.    ED Treatments / Results  Labs (all labs ordered are listed, but only abnormal results are displayed) Labs Reviewed - No data to display  EKG None  Radiology Ct Head Wo Contrast  Result Date: 01/01/2018 CLINICAL DATA:  27 year old female with posttraumatic headache. EXAM: CT HEAD WITHOUT CONTRAST TECHNIQUE: Contiguous axial images were obtained from the base of the skull through the vertex without intravenous contrast. COMPARISON:  None. FINDINGS: Brain: No evidence of acute infarction, hemorrhage, hydrocephalus, extra-axial collection or mass lesion/mass effect. Vascular: No hyperdense vessel or unexpected calcification. Skull: Normal. Negative for fracture or focal lesion. Sinuses/Orbits: No acute finding. Other: None IMPRESSION: Normal noncontrast CT of the brain. Electronically Signed   By: Anner Crete M.D.   On: 01/01/2018 23:12    Procedures Procedures (including critical care time)  Medications Ordered in ED Medications  acetaminophen (TYLENOL) tablet 650 mg (650 mg Oral Given 01/01/18 2217)     Initial Impression / Assessment and Plan / ED Course  I have reviewed the triage vital signs and the nursing notes.  Pertinent labs & imaging results that were available  during my care of the patient were reviewed by me and considered in my medical decision making (see chart for details).     Patient presents to ED for evaluation of head injury that occurred prior to arrival.  She was at work when she accidentally hit the left side of her head on the door of a metal safe.  She denies any loss of consciousness.  She does report some lightheadedness, pain at the site of injury and feeling like she is "jumbling up words."  Physical exam she is overall well-appearing.  She has no deficits on her neurological exam noted.  There are no wounds on the head noted.  She is no neck pain.  I discussed the risk and benefits of obtaining a head CT for this apparent minor head injury.  Patient contemplated but then decided to proceed if she is concerned about the pain at the site, lightheadedness.  CT of the head returned as negative.  Patient reports significant improvement in her symptoms with Tylenol given here.  Patient was counseled on head injury precautions and symptoms that should indicate their return to the ED.  These include severe worsening headache, vision changes, confusion, loss of consciousness, trouble walking, nausea & vomiting, or weakness/tingling in extremities.  Portions of this note were generated with [employer]. Dictation errors may occur despite best attempts at proofreading.   Final Clinical Impressions(s) / ED Diagnoses   Final diagnoses:  Minor head injury, initial encounter    ED Discharge Orders    None       Delia Heady, PA-C 01/01/18 2355    Dorie Rank, MD 01/02/18 [phone]

## 2018-01-02 NOTE — Progress Notes (Signed)
Office: [phone]  /  Fax: [phone]   HPI:   Chief Complaint: OBESITY Brandy Saunders is here to discuss her progress with her obesity treatment plan. She is on the keep a food journal with 1600 to 1700 calories and 90 grams of protein daily and/or the Category 3 plan and is following her eating plan approximately 70 % of the time. She states she is exercising 0 minutes 0 times per week. Brandy Saunders has significant cravings for salty food, secondary to headaches. She has had a significant increase in the number of headaches since her last visit, possibly related to metformin. Her weight is (!) 419 lb (190.1 kg) today and has had a weight gain of 4 pounds over a period of 2 weeks since her last visit. She has lost 28 lbs since starting treatment with [place].  Vitamin D deficiency Brandy Saunders has a diagnosis of vitamin D deficiency. She is currently taking vit D and denies nausea, vomiting or muscle weakness.  Pre-Diabetes Brandy Saunders has a diagnosis of pre-diabetes based on her elevated Hgb A1c and was informed this puts her at greater risk of developing diabetes. She is having headaches related to medication. She would like a refill of metformin in case headache is not medication related. Brandy Saunders continues to work on diet and exercise to decrease risk of diabetes. She denies nausea or hypoglycemia.  At risk for diabetes Brandy Saunders is at higher than average risk for developing diabetes due to her obesity and pre-diabetes. She currently denies polyuria or polydipsia.  ALLERGIES: Allergies  Allergen Reactions  . Ibuprofen Diarrhea  . Metformin And Related Diarrhea    MEDICATIONS: Current Outpatient Medications on File Prior to Visit  Medication Sig Dispense Refill  . acetaminophen (TYLENOL) 500 MG tablet Take 500 mg by mouth every 6 (six) hours as needed.    . Ascorbic Acid (VITAMIN C) 1000 MG tablet Take 1,000 mg by mouth daily.    Marland Kitchen BIOTIN 5000 PO Take 10,000 mg by mouth daily.     Marland Kitchen BLISOVI FE  1/20 1-20 MG-MCG tablet TAKE 1 TABLET DAILY 84 tablet 4  . Cyanocobalamin (VITAMIN B 12 PO) Take 2,500 mcg by mouth daily.    . metoprolol succinate (TOPROL XL) 50 MG 24 hr tablet Take 2 tab po qd  with or immediately following a meal. 180 tablet 2  . Multiple Vitamins-Calcium (ONE-A-DAY WOMENS PO) Take 1 tablet by mouth daily.     . SUMAtriptan (IMITREX) 100 MG tablet Take 1 tablet (100 mg total) by mouth 2 (two) times daily as needed for migraine. 10 tablet 11   No current facility-administered medications on file prior to visit.     PAST MEDICAL HISTORY: Past Medical History:  Diagnosis Date  . Anxiety   . Asthma   . Back pain   . Binge eating   . Carpal tunnel syndrome, bilateral   . Chest pain   . Common migraine with intractable migraine 02/02/2017  . Constipation   . Depression   . Dyspnea   . Fatty liver   . GERD (gastroesophageal reflux disease)   . Hypertension   . Joint pain   . Leg edema   . Morbidly obese ([place])   . Palpitations   . PCOS (polycystic ovarian syndrome)   . Prediabetes   . Vitamin D deficiency     PAST SURGICAL HISTORY: Past Surgical History:  Procedure Laterality Date  . TONSILECTOMY, ADENOIDECTOMY, BILATERAL MYRINGOTOMY AND TUBES  1994    SOCIAL HISTORY: Social History  Tobacco Use  . Smoking status: Former Smoker    Types: Cigarettes  . Smokeless tobacco: Never Used  Substance Use Topics  . Alcohol use: Yes    Alcohol/week: 1.8 oz    Types: 3 Glasses of wine per week  . Drug use: No    Types: Marijuana    Comment: once every 6 months, taking CBD    FAMILY HISTORY: Family History  Problem Relation Age of Onset  . Hypertension Mother   . Hyperlipidemia Mother   . Obesity Mother   . Stroke Father   . Obesity Father   . Alcoholism Father   . Drug abuse Father   . Diabetes Maternal Uncle   . Heart disease Maternal Grandmother   . Colon cancer Neg Hx     ROS: Review of Systems  Constitutional: Negative for weight loss.    Gastrointestinal: Negative for nausea and vomiting.  Genitourinary: Negative for frequency.  Musculoskeletal:       Negative for muscle weakness  Neurological: Positive for headaches.  Endo/Heme/Allergies: Negative for polydipsia.       Positive for cravings Negative for hypoglycemia    PHYSICAL EXAM: Blood pressure 116/74, pulse 72, temperature 98.2 F (36.8 C), temperature source Oral, height 5\' 6"  (1.676 m), weight (!) 419 lb (190.1 kg), last menstrual period 12/12/2017, SpO2 99 %. Body mass index is 67.63 kg/m. Physical Exam  Constitutional: She is oriented to person, place, and time. She appears well-developed and well-nourished.  Cardiovascular: Normal rate.  Pulmonary/Chest: Effort normal.  Musculoskeletal: Normal range of motion.  Neurological: She is oriented to person, place, and time.  Skin: Skin is warm and dry.  Psychiatric: She has a normal mood and affect.  Vitals reviewed.   RECENT LABS AND TESTS: BMET    Component Value Date/Time   NA 137 10/11/2017 1022   K 4.9 10/11/2017 1022   CL 101 10/11/2017 1022   CO2 20 10/11/2017 1022   GLUCOSE 106 (H) 10/11/2017 1022   GLUCOSE 114 (H) 09/13/2017 0929   BUN 10 10/11/2017 1022   CREATININE 0.72 10/11/2017 1022   CREATININE 0.67 09/28/2014 1146   CALCIUM 9.0 10/11/2017 1022   GFRNONAA 115 10/11/2017 1022   GFRAA 133 10/11/2017 1022   Lab Results  Component Value Date   HGBA1C 6.0 (H) 10/11/2017   HGBA1C 6.1 09/13/2017   HGBA1C 5.9 06/12/2017   HGBA1C 5.8 01/07/2016   HGBA1C 6.3 10/05/2014   Lab Results  Component Value Date   INSULIN 73.6 (H) 10/11/2017   CBC    Component Value Date/Time   WBC 4.7 10/11/2017 1022   WBC 4.9 06/12/2017 1344   RBC 4.79 10/11/2017 1022   RBC 4.97 06/12/2017 1344   HGB 13.0 10/11/2017 1022   HCT 39.9 10/11/2017 1022   PLT 263.0 06/12/2017 1344   MCV 83 10/11/2017 1022   MCH 27.1 10/11/2017 1022   MCH 24.5 (L) 09/28/2014 1146   MCHC 32.6 10/11/2017 1022   MCHC  32.5 06/12/2017 1344   RDW 13.8 10/11/2017 1022   LYMPHSABS 1.7 10/11/2017 1022   MONOABS 0.2 06/12/2017 1344   EOSABS 0.1 10/11/2017 1022   BASOSABS 0.0 10/11/2017 1022   Iron/TIBC/Ferritin/ %Sat No results found for: IRON, TIBC, FERRITIN, IRONPCTSAT Lipid Panel     Component Value Date/Time   CHOL 143 10/11/2017 1022   TRIG 78 10/11/2017 1022   HDL 53 10/11/2017 1022   CHOLHDL 3 09/13/2017 0929   VLDL 16.4 09/13/2017 0929   LDLCALC 74 10/11/2017  1022   Hepatic Function Panel     Component Value Date/Time   PROT 6.7 10/11/2017 1022   ALBUMIN 4.1 10/11/2017 1022   AST 11 10/11/2017 1022   ALT 15 10/11/2017 1022   ALKPHOS 52 10/11/2017 1022   BILITOT 0.3 10/11/2017 1022   BILIDIR 0.1 09/28/2014 1146   IBILI 0.4 09/28/2014 1146      Component Value Date/Time   TSH 1.880 10/11/2017 1022   TSH 1.98 06/12/2017 1344   TSH 1.49 04/11/2016 1627   Results for RYLEN, HOU (MRN [id number]) as of 01/02/2018 17:37  Ref. Range 10/11/2017 10:22  Vitamin D, 25-Hydroxy Latest Ref Range: 30.0 - 100.0 ng/mL 30.5   ASSESSMENT AND PLAN: Vitamin D deficiency - Plan: Vitamin D, Ergocalciferol, (DRISDOL) 50000 units CAPS capsule  Pre-diabetes - Plan: metFORMIN (GLUCOPHAGE) 500 MG tablet  At risk for diabetes mellitus  Class 3 severe obesity with serious comorbidity and body mass index (BMI) of 60.0 to 69.9 in adult, unspecified obesity type ([place])  PLAN:  Vitamin D Deficiency Brandy Saunders was informed that low vitamin D levels contributes to fatigue and are associated with obesity, breast, and colon cancer. She agrees to continue to take prescription Vit D @50 ,000 IU every week #4 with no refills and will follow up for routine testing of vitamin D, at least 2-3 times per year. She was informed of the risk of over-replacement of vitamin D and agrees to not increase her dose unless she discusses this with [place] first. Brandy Saunders agrees to follow up with our clinic in 2  weeks.  Pre-Diabetes Brandy Saunders will continue to work on weight loss, exercise, and decreasing simple carbohydrates in her diet to help decrease the risk of diabetes. We dicussed metformin including benefits and risks. She was informed that eating too many simple carbohydrates or too many calories at one sitting increases the likelihood of GI side effects. Channel agrees to continue  Metformin 500 mg qAM #30 with no refills and follow up with [place] as directed to monitor her progress.  Diabetes risk counseling Brandy Saunders was given extended (15 minutes) diabetes prevention counseling today. She is 27 y.o. female and has risk factors for diabetes including obesity and pre-diabetes. We discussed intensive lifestyle modifications today with an emphasis on weight loss as well as increasing exercise and decreasing simple carbohydrates in her diet.  Obesity Brandy Saunders is currently in the action stage of change. As such, her goal is to continue with weight loss efforts She has agreed to follow the Category 3 plan Brandy Saunders has been instructed to work up to a goal of 150 minutes of combined cardio and strengthening exercise per week for weight loss and overall health benefits. We discussed the following Behavioral Modification Strategies today: planning for success, increasing lean protein intake, work on meal planning and easy cooking plans, travel eating strategies  and celebration eating strategies  Brandy Saunders has agreed to follow up with our clinic in 2 weeks. She was informed of the importance of frequent follow up visits to maximize her success with intensive lifestyle modifications for her multiple health conditions.   OBESITY BEHAVIORAL INTERVENTION VISIT  Today's visit was # 6 out of 22.  Starting weight: 447 lbs Starting date: 10/11/17 Today's weight : 419 lbs Today's date: 01/01/2018 Total lbs lost to date: 67 (Patients must lose 7 lbs in the first 6 months to continue with counseling)   ASK: We  discussed the diagnosis of obesity with Brandy Saunders today and Brandy Saunders agreed to give [place]  permission to discuss obesity behavioral modification therapy today.  ASSESS: Fradel has the diagnosis of obesity and her BMI today is 67.66 Brandy Saunders is in the action stage of change   ADVISE: Barry was educated on the multiple health risks of obesity as well as the benefit of weight loss to improve her health. She was advised of the need for long term treatment and the importance of lifestyle modifications.  AGREE: Multiple dietary modification options and treatment options were discussed and  Brandy Saunders agreed to the above obesity treatment plan.  I, Doreene Nest, am acting as transcriptionist for Eber Jones, MD  I have reviewed the above documentation for accuracy and completeness, and I agree with the above. - Ilene Qua, MD

## 2018-01-11 ENCOUNTER — Ambulatory Visit: Payer: Managed Care, Other (non HMO) | Admitting: Family Medicine

## 2018-01-16 ENCOUNTER — Ambulatory Visit (INDEPENDENT_AMBULATORY_CARE_PROVIDER_SITE_OTHER): Payer: Managed Care, Other (non HMO) | Admitting: Family Medicine

## 2018-01-16 VITALS — BP 115/80 | HR 71 | Temp 98.3°F | Ht 66.0 in | Wt >= 6400 oz

## 2018-01-16 DIAGNOSIS — R7303 Prediabetes: Secondary | ICD-10-CM | POA: Diagnosis not present

## 2018-01-16 DIAGNOSIS — Z6841 Body Mass Index (BMI) 40.0 and over, adult: Secondary | ICD-10-CM | POA: Diagnosis not present

## 2018-01-16 DIAGNOSIS — E559 Vitamin D deficiency, unspecified: Secondary | ICD-10-CM | POA: Diagnosis not present

## 2018-01-16 DIAGNOSIS — E66813 Obesity, class 3: Secondary | ICD-10-CM

## 2018-01-16 DIAGNOSIS — Z9189 Other specified personal risk factors, not elsewhere classified: Secondary | ICD-10-CM

## 2018-01-16 NOTE — Progress Notes (Signed)
Office: [phone]  /  Fax: [phone]   HPI:   Chief Complaint: OBESITY Brandy Saunders is here to discuss her progress with her obesity treatment plan. She is on the Category 3 plan and is following her eating plan approximately 85 % of the time. She states she is walking for 30-60 minutes 3 times per week. Brandy Saunders had a weekend in [place] last weekend and indulged in alcohol.  Her weight is (!) 420 lb (190.5 kg) today and has gained 1 pound since her last visit. She has lost 27 lbs since starting treatment with [place].  Pre-Diabetes Brandy Saunders has a diagnosis of pre-diabetes based on her elevated Hgb A1c and was informed this puts her at greater risk of developing diabetes. She stopped metformin, notes occasional carbohydrate cravings, and she continues to work on diet and exercise to decrease risk of diabetes. She denies nausea or hypoglycemia.  At risk for diabetes Brandy Saunders is at higher than average risk for developing diabetes due to her obesity and pre-diabetes. She currently denies polyuria or polydipsia.  Vitamin D Deficiency Brandy Saunders has a diagnosis of vitamin D deficiency. She is currently taking prescription Vit D and denies nausea, vomiting or muscle weakness.  ALLERGIES: Allergies  Allergen Reactions  . Ibuprofen Diarrhea  . Metformin And Related Diarrhea    MEDICATIONS: Current Outpatient Medications on File Prior to Visit  Medication Sig Dispense Refill  . acetaminophen (TYLENOL) 500 MG tablet Take 500 mg by mouth every 6 (six) hours as needed.    . Ascorbic Acid (VITAMIN C) 1000 MG tablet Take 1,000 mg by mouth daily.    Marland Kitchen BIOTIN 5000 PO Take 10,000 mg by mouth daily.     Marland Kitchen BLISOVI FE 1/20 1-20 MG-MCG tablet TAKE 1 TABLET DAILY 84 tablet 4  . Cyanocobalamin (VITAMIN B 12 PO) Take 2,500 mcg by mouth daily.    . metFORMIN (GLUCOPHAGE) 500 MG tablet Take 1 tablet (500 mg total) by mouth daily with breakfast. 30 tablet 0  . metoprolol succinate (TOPROL XL) 50 MG 24 hr  tablet Take 2 tab po qd  with or immediately following a meal. 180 tablet 2  . Multiple Vitamins-Calcium (ONE-A-DAY WOMENS PO) Take 1 tablet by mouth daily.     . SUMAtriptan (IMITREX) 100 MG tablet Take 1 tablet (100 mg total) by mouth 2 (two) times daily as needed for migraine. 10 tablet 11  . Vitamin D, Ergocalciferol, (DRISDOL) 50000 units CAPS capsule Take 1 capsule (50,000 Units total) by mouth every 7 (seven) days. 4 capsule 0   No current facility-administered medications on file prior to visit.     PAST MEDICAL HISTORY: Past Medical History:  Diagnosis Date  . Anxiety   . Asthma   . Back pain   . Binge eating   . Carpal tunnel syndrome, bilateral   . Chest pain   . Common migraine with intractable migraine 02/02/2017  . Constipation   . Depression   . Dyspnea   . Fatty liver   . GERD (gastroesophageal reflux disease)   . Hypertension   . Joint pain   . Leg edema   . Morbidly obese ([place])   . Palpitations   . PCOS (polycystic ovarian syndrome)   . Prediabetes   . Vitamin D deficiency     PAST SURGICAL HISTORY: Past Surgical History:  Procedure Laterality Date  . TONSILECTOMY, ADENOIDECTOMY, BILATERAL MYRINGOTOMY AND TUBES  1994    SOCIAL HISTORY: Social History   Tobacco Use  . Smoking status: Former  Smoker    Types: Cigarettes  . Smokeless tobacco: Never Used  Substance Use Topics  . Alcohol use: Yes    Alcohol/week: 1.8 oz    Types: 3 Glasses of wine per week  . Drug use: No    Types: Marijuana    Comment: once every 6 months, taking CBD    FAMILY HISTORY: Family History  Problem Relation Age of Onset  . Hypertension Mother   . Hyperlipidemia Mother   . Obesity Mother   . Stroke Father   . Obesity Father   . Alcoholism Father   . Drug abuse Father   . Diabetes Maternal Uncle   . Heart disease Maternal Grandmother   . Colon cancer Neg Hx     ROS: Review of Systems  Constitutional: Negative for weight loss.  Gastrointestinal: Negative for  nausea and vomiting.  Genitourinary: Negative for frequency.  Musculoskeletal:       Negative muscle weakness  Endo/Heme/Allergies: Negative for polydipsia.       Negative hypoglycemia    PHYSICAL EXAM: Blood pressure 115/80, pulse 71, temperature 98.3 F (36.8 C), temperature source Oral, height 5\' 6"  (1.676 m), weight (!) 420 lb (190.5 kg), SpO2 99 %. Body mass index is 67.79 kg/m. Physical Exam  Constitutional: She is oriented to person, place, and time. She appears well-developed and well-nourished.  Cardiovascular: Normal rate.  Pulmonary/Chest: Effort normal.  Musculoskeletal: Normal range of motion.  Neurological: She is oriented to person, place, and time.  Skin: Skin is warm and dry.  Psychiatric: She has a normal mood and affect. Her behavior is normal.  Vitals reviewed.   RECENT LABS AND TESTS: BMET    Component Value Date/Time   NA 137 10/11/2017 1022   K 4.9 10/11/2017 1022   CL 101 10/11/2017 1022   CO2 20 10/11/2017 1022   GLUCOSE 106 (H) 10/11/2017 1022   GLUCOSE 114 (H) 09/13/2017 0929   BUN 10 10/11/2017 1022   CREATININE 0.72 10/11/2017 1022   CREATININE 0.67 09/28/2014 1146   CALCIUM 9.0 10/11/2017 1022   GFRNONAA 115 10/11/2017 1022   GFRAA 133 10/11/2017 1022   Lab Results  Component Value Date   HGBA1C 6.0 (H) 10/11/2017   HGBA1C 6.1 09/13/2017   HGBA1C 5.9 06/12/2017   HGBA1C 5.8 01/07/2016   HGBA1C 6.3 10/05/2014   Lab Results  Component Value Date   INSULIN 73.6 (H) 10/11/2017   CBC    Component Value Date/Time   WBC 4.7 10/11/2017 1022   WBC 4.9 06/12/2017 1344   RBC 4.79 10/11/2017 1022   RBC 4.97 06/12/2017 1344   HGB 13.0 10/11/2017 1022   HCT 39.9 10/11/2017 1022   PLT 263.0 06/12/2017 1344   MCV 83 10/11/2017 1022   MCH 27.1 10/11/2017 1022   MCH 24.5 (L) 09/28/2014 1146   MCHC 32.6 10/11/2017 1022   MCHC 32.5 06/12/2017 1344   RDW 13.8 10/11/2017 1022   LYMPHSABS 1.7 10/11/2017 1022   MONOABS 0.2 06/12/2017 1344    EOSABS 0.1 10/11/2017 1022   BASOSABS 0.0 10/11/2017 1022   Iron/TIBC/Ferritin/ %Sat No results found for: IRON, TIBC, FERRITIN, IRONPCTSAT Lipid Panel     Component Value Date/Time   CHOL 143 10/11/2017 1022   TRIG 78 10/11/2017 1022   HDL 53 10/11/2017 1022   CHOLHDL 3 09/13/2017 0929   VLDL 16.4 09/13/2017 0929   LDLCALC 74 10/11/2017 1022   Hepatic Function Panel     Component Value Date/Time   PROT 6.7 10/11/2017  1022   ALBUMIN 4.1 10/11/2017 1022   AST 11 10/11/2017 1022   ALT 15 10/11/2017 1022   ALKPHOS 52 10/11/2017 1022   BILITOT 0.3 10/11/2017 1022   BILIDIR 0.1 09/28/2014 1146   IBILI 0.4 09/28/2014 1146      Component Value Date/Time   TSH 1.880 10/11/2017 1022   TSH 1.98 06/12/2017 1344   TSH 1.49 04/11/2016 1627  Results for ASIANA, BENNINGER (MRN [id number]) as of 01/16/2018 13:18  Ref. Range 10/11/2017 10:22  Vitamin D, 25-Hydroxy Latest Ref Range: 30.0 - 100.0 ng/mL 30.5    ASSESSMENT AND PLAN: Pre-diabetes - Plan: Insulin, random, Hemoglobin A1c  Vitamin D deficiency - Plan: VITAMIN D 25 Hydroxy (Vit-D Deficiency, Fractures)  At risk for diabetes mellitus  Class 3 severe obesity with serious comorbidity and body mass index (BMI) of 60.0 to 69.9 in adult, unspecified obesity type ([place])  PLAN:  Pre-Diabetes Brandy Saunders will continue to work on weight loss, exercise, and decreasing simple carbohydrates in her diet to help decrease the risk of diabetes. We dicussed metformin including benefits and risks. She was informed that eating too many simple carbohydrates or too many calories at one sitting increases the likelihood of GI side effects. We will check labs today and Brandy Saunders agrees to follow up with our clinic in 2 weeks as directed to monitor her progress.  Diabetes risk counselling Brandy Saunders was given extended (15 minutes) diabetes prevention counseling today. She is 27 y.o. female and has risk factors for diabetes including obesity and  pre-diabetes. We discussed intensive lifestyle modifications today with an emphasis on weight loss as well as increasing exercise and decreasing simple carbohydrates in her diet.  Vitamin D Deficiency Brandy Saunders was informed that low vitamin D levels contributes to fatigue and are associated with obesity, breast, and colon cancer. Brandy Saunders agrees to continue taking prescription Vit D @50 ,000 IU every week and will follow up for routine testing of vitamin D, at least 2-3 times per year. She was informed of the risk of over-replacement of vitamin D and agrees to not increase her dose unless she discusses this with [place] first. We will check labs today and Brandy Saunders agrees to follow up with our clinic in 2 weeks.  Obesity Brandy Saunders is currently in the action stage of change. As such, her goal is to continue with weight loss efforts She has agreed to follow the Category 4 plan Brandy Saunders has been instructed to work up to a goal of 150 minutes of combined cardio and strengthening exercise per week for weight loss and overall health benefits. We discussed the following Behavioral Modification Strategies today: increasing lean protein intake, better snacking choices, and planning for success   Brandy Saunders has agreed to follow up with our clinic in 2 weeks. She was informed of the importance of frequent follow up visits to maximize her success with intensive lifestyle modifications for her multiple health conditions.   OBESITY BEHAVIORAL INTERVENTION VISIT  Today's visit was # 7 out of 22.  Starting weight: 447 lbs Starting date: 10/11/17 Today's weight : 420 lbs Today's date: 01/16/2018 Total lbs lost to date: 73 (Patients must lose 7 lbs in the first 6 months to continue with counseling)   ASK: We discussed the diagnosis of obesity with Brandy Saunders today and Brandy Saunders agreed to give [place] permission to discuss obesity behavioral modification therapy today.  ASSESS: Brandy Saunders has the diagnosis of  obesity and her BMI today is 67.82 Brandy Saunders is in the action stage of change  ADVISE: Brandy Saunders was educated on the multiple health risks of obesity as well as the benefit of weight loss to improve her health. She was advised of the need for long term treatment and the importance of lifestyle modifications.  AGREE: Multiple dietary modification options and treatment options were discussed and  Brandy Saunders agreed to the above obesity treatment plan.  I, Trixie Dredge, am acting as transcriptionist for Ilene Qua, MD  I have reviewed the above documentation for accuracy and completeness, and I agree with the above. - Ilene Qua, MD

## 2018-01-18 LAB — VITAMIN D 25 HYDROXY (VIT D DEFICIENCY, FRACTURES): Vit D, 25-Hydroxy: 44.7 ng/mL (ref 30.0–100.0)

## 2018-01-18 LAB — HEMOGLOBIN A1C
Est. average glucose Bld gHb Est-mCnc: 117 mg/dL
Hgb A1c MFr Bld: 5.7 % — ABNORMAL HIGH (ref 4.8–5.6)

## 2018-01-18 LAB — INSULIN, RANDOM: INSULIN: 39.5 u[IU]/mL — AB (ref 2.6–24.9)

## 2018-01-31 ENCOUNTER — Encounter (HOSPITAL_BASED_OUTPATIENT_CLINIC_OR_DEPARTMENT_OTHER): Payer: Self-pay | Admitting: Emergency Medicine

## 2018-01-31 ENCOUNTER — Emergency Department (HOSPITAL_BASED_OUTPATIENT_CLINIC_OR_DEPARTMENT_OTHER)
Admission: EM | Admit: 2018-01-31 | Discharge: 2018-01-31 | Disposition: A | Discharge status: Home / Self Care | Payer: Managed Care, Other (non HMO) | Attending: Emergency Medicine | Admitting: Emergency Medicine

## 2018-01-31 ENCOUNTER — Other Ambulatory Visit: Payer: Self-pay

## 2018-01-31 ENCOUNTER — Emergency Department (HOSPITAL_BASED_OUTPATIENT_CLINIC_OR_DEPARTMENT_OTHER): Payer: Managed Care, Other (non HMO)

## 2018-01-31 DIAGNOSIS — J45909 Unspecified asthma, uncomplicated: Secondary | ICD-10-CM | POA: Insufficient documentation

## 2018-01-31 DIAGNOSIS — M79604 Pain in right leg: Secondary | ICD-10-CM

## 2018-01-31 DIAGNOSIS — Z7984 Long term (current) use of oral hypoglycemic drugs: Secondary | ICD-10-CM | POA: Diagnosis not present

## 2018-01-31 DIAGNOSIS — I10 Essential (primary) hypertension: Secondary | ICD-10-CM | POA: Diagnosis not present

## 2018-01-31 DIAGNOSIS — R519 Headache, unspecified: Secondary | ICD-10-CM

## 2018-01-31 DIAGNOSIS — Z87891 Personal history of nicotine dependence: Secondary | ICD-10-CM | POA: Insufficient documentation

## 2018-01-31 DIAGNOSIS — R51 Headache: Secondary | ICD-10-CM | POA: Diagnosis not present

## 2018-01-31 LAB — URINALYSIS, ROUTINE W REFLEX MICROSCOPIC
Bilirubin Urine: NEGATIVE
Glucose, UA: NEGATIVE mg/dL
Hgb urine dipstick: NEGATIVE
Ketones, ur: NEGATIVE mg/dL
Leukocytes, UA: NEGATIVE
NITRITE: NEGATIVE
PH: 6 (ref 5.0–8.0)
Protein, ur: NEGATIVE mg/dL
SPECIFIC GRAVITY, URINE: 1.02 (ref 1.005–1.030)

## 2018-01-31 LAB — PREGNANCY, URINE: PREG TEST UR: NEGATIVE

## 2018-01-31 MED ORDER — KETOROLAC TROMETHAMINE 60 MG/2ML IM SOLN
60.0000 mg | Freq: Once | INTRAMUSCULAR | Status: AC
  Administered 2018-01-31: 60 mg via INTRAMUSCULAR
  Filled 2018-01-31: qty 2

## 2018-01-31 NOTE — ED Triage Notes (Signed)
Pt c/o right lower leg pain x 1 week. Pt also c/o headache and feeling more fatigued than normal.

## 2018-01-31 NOTE — ED Provider Notes (Signed)
[facility]PART[facility] Provider Note   CSN: [id number] Arrival date & time: 01/31/18  0630     History   Chief Complaint Chief Complaint  Patient presents with  . Leg Pain  . Headache    HPI Brandy Saunders is a 27 y.o. female.  The history is provided by the patient. No language interpreter was used.  Leg Pain    Headache      Brandy Saunders is a 27 y.o. female who presents to the Emergency Department complaining of leg pain, headache. She has one week of right sided calf pain. The pain is located over the medial calf. It did begin over the foot but did migrate proximately. She states that the area feels warm to touch and is painful to touch as well as to ambulate. No injuries to that area. She also reports three days of left sided headache. Symptoms are unusual for her typical migraine headaches because those are normally on the right side. Pain is fairly constant and is worse upon waking. No fevers, nausea, vomiting, chest pain, shortness of breath, vision changes, numbness, weakness. She has a history of hypertension, morbid obesity, prediabetes. She takes estrogen-based birth control for the last 2 to 3 years.  Past Medical History:  Diagnosis Date  . Anxiety   . Asthma   . Back pain   . Binge eating   . Carpal tunnel syndrome, bilateral   . Chest pain   . Common migraine with intractable migraine 02/02/2017  . Constipation   . Depression   . Dyspnea   . Fatty liver   . GERD (gastroesophageal reflux disease)   . Hypertension   . Joint pain   . Leg edema   . Morbidly obese ([place])   . Palpitations   . PCOS (polycystic ovarian syndrome)   . Prediabetes   . Vitamin D deficiency     Patient Active Problem List   Diagnosis Date Noted  . Pain of left calf 12/14/2017  . Other fatigue 10/11/2017  . Shortness of breath on exertion 10/11/2017  . Prediabetes 10/11/2017  . Chronic left-sided thoracic back pain 05/23/2017  . Acute left-sided  thoracic back pain 05/09/2017  . Migraine 05/09/2017  . Essential hypertension 05/09/2017  . Common migraine with intractable migraine 02/02/2017  . Chronic fatigue 01/06/2016  . Fatty liver 02/15/2015  . Esophageal reflux 02/15/2015  . Late menses 07/30/2014  . Morbid obesity ([place]) 07/30/2014  . Asthma without acute exacerbation 07/30/2014  . IBS (irritable bowel syndrome) 07/30/2014  . Elevated BP 07/30/2014  . PCOS (polycystic ovarian syndrome) 11/25/2012  . Carpal tunnel syndrome, bilateral     Past Surgical History:  Procedure Laterality Date  . TONSILECTOMY, ADENOIDECTOMY, BILATERAL MYRINGOTOMY AND TUBES  1994     OB History    Gravida  0   Para  0   Term  0   Preterm  0   AB  0   Living  0     SAB  0   TAB  0   Ectopic  0   Multiple  0   Live Births  0            Home Medications    Prior to Admission medications   Medication Sig Start Date End Date Taking? Authorizing Provider  acetaminophen (TYLENOL) 500 MG tablet Take 500 mg by mouth every 6 (six) hours as needed.    [provider]  Ascorbic Acid (VITAMIN C) 1000 MG  tablet Take 1,000 mg by mouth daily.    [provider]  BIOTIN 5000 PO Take 10,000 mg by mouth daily.     [provider]  BLISOVI FE 1/20 1-20 MG-MCG tablet TAKE 1 TABLET DAILY 03/09/17   Carollee Herter, Alferd Apa, DO  Cyanocobalamin (VITAMIN B 12 PO) Take 2,500 mcg by mouth daily.    [provider]  metFORMIN (GLUCOPHAGE) 500 MG tablet Take 1 tablet (500 mg total) by mouth daily with breakfast. 01/01/18   Eber Jones, MD  metoprolol succinate (TOPROL XL) 50 MG 24 hr tablet Take 2 tab po qd  with or immediately following a meal. 12/13/17   Carollee Herter, Alferd Apa, DO  Multiple Vitamins-Calcium (ONE-A-DAY WOMENS PO) Take 1 tablet by mouth daily.     [provider]  SUMAtriptan (IMITREX) 100 MG tablet Take 1 tablet (100 mg total) by mouth 2 (two) times daily as needed for migraine.  05/30/17   Ward Givens, NP  Vitamin D, Ergocalciferol, (DRISDOL) 50000 units CAPS capsule Take 1 capsule (50,000 Units total) by mouth every 7 (seven) days. 01/01/18   Eber Jones, MD    Family History Family History  Problem Relation Age of Onset  . Hypertension Mother   . Hyperlipidemia Mother   . Obesity Mother   . Stroke Father   . Obesity Father   . Alcoholism Father   . Drug abuse Father   . Diabetes Maternal Uncle   . Heart disease Maternal Grandmother   . Colon cancer Neg Hx     Social History Social History   Tobacco Use  . Smoking status: Former Smoker    Types: Cigarettes  . Smokeless tobacco: Never Used  Substance Use Topics  . Alcohol use: Yes    Alcohol/week: 1.8 oz    Types: 3 Glasses of wine per week  . Drug use: No    Types: Marijuana    Comment: once every 6 months, taking CBD     Allergies   Ibuprofen and Metformin and related   Review of Systems Review of Systems  Neurological: Positive for headaches.  All other systems reviewed and are negative.    Physical Exam Updated Vital Signs BP 129/61 (BP Location: Right Arm)   Pulse 72   Temp 98.9 F (37.2 C) (Oral)   Resp 18   Ht 5\' 6"  (1.676 m)   Wt (!) 190.5 kg (420 lb)   SpO2 98%   BMI 67.79 kg/m   Physical Exam  Constitutional: She is oriented to person, place, and time. She appears well-developed and well-nourished.  HENT:  Head: Normocephalic and atraumatic.  Eyes: Pupils are equal, round, and reactive to light. EOM are normal.  Cardiovascular: Normal rate and regular rhythm.  No murmur heard. Pulmonary/Chest: Effort normal and breath sounds normal. No respiratory distress.  Abdominal: Soft. There is no tenderness. There is no rebound and no guarding.  Musculoskeletal: She exhibits no edema.  2+ DP pulses bilaterally. There is mild tenderness to palpation over the medial aspect of the right calf without any erythema or induration.  Neurological: She is alert and  oriented to person, place, and time. No cranial nerve deficit.  Five out of five strength in all four extremities with sensation to light touch intact in all four extremities. No pronator drift.  Skin: Skin is warm and dry.  Psychiatric: She has a normal mood and affect. Her behavior is normal.  Nursing note and vitals reviewed.    ED  Treatments / Results  Labs (all labs ordered are listed, but only abnormal results are displayed) Labs Reviewed  URINALYSIS, ROUTINE W REFLEX MICROSCOPIC  PREGNANCY, URINE    EKG None  Radiology US Venous Img Lower Unilateral Right  Result Date: 01/31/2018 CLINICAL DATA:  Right calf pain and edema EXAM: RIGHT LOWER EXTREMITY VENOUS DOPPLER ULTRASOUND TECHNIQUE: Gray-scale sonography with graded compression, as well as color Doppler and duplex ultrasound were performed to evaluate the lower extremity deep venous systems from the level of the common femoral vein and including the common femoral, femoral, profunda femoral, popliteal and calf veins including the posterior tibial, peroneal and gastrocnemius veins when visible. The superficial great saphenous vein was also interrogated. Spectral Doppler was utilized to evaluate flow at rest and with distal augmentation maneuvers in the common femoral, femoral and popliteal veins. COMPARISON:  None. FINDINGS: Contralateral Common Femoral Vein: Respiratory phasicity is normal and symmetric with the symptomatic side. No evidence of thrombus. Normal compressibility. Common Femoral Vein: No evidence of thrombus. Normal compressibility, respiratory phasicity and response to augmentation. Saphenofemoral Junction: No evidence of thrombus. Normal compressibility and flow on color Doppler imaging. Profunda Femoral Vein: No evidence of thrombus. Normal compressibility and flow on color Doppler imaging. Femoral Vein: No evidence of thrombus. Normal compressibility, respiratory phasicity and response to augmentation. Popliteal Vein:  No evidence of thrombus. Normal compressibility, respiratory phasicity and response to augmentation. Calf Veins: No evidence of thrombus. Normal compressibility and flow on color Doppler imaging. Superficial Great Saphenous Vein: No evidence of thrombus. Normal compressibility. Venous Reflux:  None. Other Findings:  None. IMPRESSION: No evidence of deep venous thrombosis. Electronically Signed   By: Jerilynn Mages.  Shick M.D.   On: 01/31/2018 09:52    Procedures Procedures (including critical care time)  Medications Ordered in ED Medications  ketorolac (TORADOL) injection 60 mg (60 mg Intramuscular Given 01/31/18 0808)     Initial Impression / Assessment and Plan / ED Course  I have reviewed the triage vital signs and the nursing notes.  Pertinent labs & imaging results that were available during my care of the patient were reviewed by me and considered in my medical decision making (see chart for details).     Patient here for evaluation of right leg pain for one week with no history of trauma. She is neurovascular intact on evaluation with no evidence of abscess, cellulitis, limb ischemia. Vascular ultrasound is negative for DVT. She also complains of recurrent headache. She is non-toxic on exam with no focal neurologic deficits. Presentation is not consistent with meningitis, subarachnoid hemorrhage, dural sinus thrombosis. Following Toradol in the emergency department her headache is improved. Counseled patient on home care for headache as well as leg pain. Discussed outpatient follow-up and return precautions.  Final Clinical Impressions(s) / ED Diagnoses   Final diagnoses:  Right leg pain  Bad headache    ED Discharge Orders    None       Quintella Reichert, MD 01/31/18 1131

## 2018-01-31 NOTE — ED Notes (Signed)
Pt unable to provide urine sample at this time 

## 2018-02-06 ENCOUNTER — Ambulatory Visit (INDEPENDENT_AMBULATORY_CARE_PROVIDER_SITE_OTHER): Payer: Managed Care, Other (non HMO) | Admitting: Family Medicine

## 2018-02-19 ENCOUNTER — Ambulatory Visit (INDEPENDENT_AMBULATORY_CARE_PROVIDER_SITE_OTHER): Payer: Managed Care, Other (non HMO) | Admitting: Family Medicine

## 2018-02-19 VITALS — BP 133/77 | HR 79 | Temp 97.9°F | Ht 66.0 in | Wt >= 6400 oz

## 2018-02-19 DIAGNOSIS — Z9189 Other specified personal risk factors, not elsewhere classified: Secondary | ICD-10-CM

## 2018-02-19 DIAGNOSIS — R7303 Prediabetes: Secondary | ICD-10-CM

## 2018-02-19 DIAGNOSIS — G43809 Other migraine, not intractable, without status migrainosus: Secondary | ICD-10-CM | POA: Diagnosis not present

## 2018-02-19 DIAGNOSIS — E559 Vitamin D deficiency, unspecified: Secondary | ICD-10-CM | POA: Diagnosis not present

## 2018-02-19 DIAGNOSIS — Z6841 Body Mass Index (BMI) 40.0 and over, adult: Secondary | ICD-10-CM

## 2018-02-19 MED ORDER — SUMATRIPTAN SUCCINATE 100 MG PO TABS: 100.0000 mg | ORAL_TABLET | Freq: Two times a day (BID) | ORAL | 0 refills | Status: DC | PRN

## 2018-02-19 MED ORDER — METFORMIN HCL 500 MG PO TABS: 500.0000 mg | ORAL_TABLET | Freq: Every day | ORAL | 0 refills | Status: DC

## 2018-02-19 MED ORDER — VITAMIN D (ERGOCALCIFEROL) 1.25 MG (50000 UNIT) PO CAPS: 50000.0000 [IU] | ORAL_CAPSULE | ORAL | 0 refills | Status: DC
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
## 2018-04-02 ENCOUNTER — Ambulatory Visit (INDEPENDENT_AMBULATORY_CARE_PROVIDER_SITE_OTHER): Payer: Managed Care, Other (non HMO) | Admitting: Family Medicine

## 2018-04-02 VITALS — BP 107/72 | HR 81 | Temp 98.0°F | Ht 66.0 in | Wt >= 6400 oz

## 2018-04-02 DIAGNOSIS — E559 Vitamin D deficiency, unspecified: Secondary | ICD-10-CM

## 2018-04-02 DIAGNOSIS — E8881 Metabolic syndrome: Secondary | ICD-10-CM | POA: Diagnosis not present

## 2018-04-02 DIAGNOSIS — Z9189 Other specified personal risk factors, not elsewhere classified: Secondary | ICD-10-CM

## 2018-04-02 DIAGNOSIS — M25571 Pain in right ankle and joints of right foot: Secondary | ICD-10-CM | POA: Diagnosis not present

## 2018-04-02 DIAGNOSIS — Z6841 Body Mass Index (BMI) 40.0 and over, adult: Secondary | ICD-10-CM

## 2018-04-02 DIAGNOSIS — G8929 Other chronic pain: Secondary | ICD-10-CM

## 2018-04-02 MED ORDER — VITAMIN D (ERGOCALCIFEROL) 1.25 MG (50000 UNIT) PO CAPS: 50000.0000 [IU] | ORAL_CAPSULE | ORAL | 0 refills | Status: DC

## 2018-04-03 NOTE — Progress Notes (Signed)
Office: [phone]  /  Fax: [phone]   HPI:   Chief Complaint: OBESITY Brandy Saunders is here to discuss her progress with her obesity treatment plan. She is on the Category 4 plan and is following her eating plan approximately 85-90 % of the time. She states she is exercising 0 minutes 0 times per week. Brandy Saunders has started a new job and hs had increase in stress. Has switched to lower carbohydrate and is liking it.  Her weight is (!) 415 lb (188.2 kg) today and has had a weight loss of 5 pounds over a period of 6 weeks since her last visit. She has lost 32 lbs since starting treatment with [place].  Vitamin D Deficiency Brandy Saunders has a diagnosis of vitamin D deficiency. She is currently taking prescription Vit D. She notes fatigue and denies nausea, vomiting or muscle weakness.  At risk for osteopenia and osteoporosis Brandy Saunders is at higher risk of osteopenia and osteoporosis due to vitamin D deficiency.   Insulin Resistance Brandy Saunders has a diagnosis of insulin resistance based on her elevated fasting insulin level >5. Although Brandy Saunders's blood glucose readings are still under good control, insulin resistance puts her at greater risk of metabolic syndrome and diabetes. She hasn't been taking metformin secondary to concern over GI upset. She continues to work on diet and exercise to decrease risk of diabetes.  Right Ankle Pain Brandy Saunders notes pain and swelling with increase in physical activity at work.  ALLERGIES: Allergies  Allergen Reactions  . Ibuprofen Diarrhea  . Metformin And Related Diarrhea    MEDICATIONS: Current Outpatient Medications on File Prior to Visit  Medication Sig Dispense Refill  . acetaminophen (TYLENOL) 500 MG tablet Take 500 mg by mouth every 6 (six) hours as needed.    . Ascorbic Acid (VITAMIN C) 1000 MG tablet Take 1,000 mg by mouth daily.    Marland Kitchen BIOTIN 5000 PO Take 10,000 mg by mouth daily.     Marland Kitchen BLISOVI FE 1/20 1-20 MG-MCG tablet TAKE 1 TABLET DAILY 84 tablet  4  . Cyanocobalamin (VITAMIN B 12 PO) Take 2,500 mcg by mouth daily.    . metFORMIN (GLUCOPHAGE) 500 MG tablet Take 1 tablet (500 mg total) by mouth daily with breakfast. 90 tablet 0  . metoprolol succinate (TOPROL XL) 50 MG 24 hr tablet Take 2 tab po qd  with or immediately following a meal. 180 tablet 2  . Multiple Vitamins-Calcium (ONE-A-DAY WOMENS PO) Take 1 tablet by mouth daily.     . SUMAtriptan (IMITREX) 100 MG tablet Take 1 tablet (100 mg total) by mouth 2 (two) times daily as needed for migraine. 60 tablet 0   No current facility-administered medications on file prior to visit.     PAST MEDICAL HISTORY: Past Medical History:  Diagnosis Date  . Anxiety   . Asthma   . Back pain   . Binge eating   . Carpal tunnel syndrome, bilateral   . Chest pain   . Common migraine with intractable migraine 02/02/2017  . Constipation   . Depression   . Dyspnea   . Fatty liver   . GERD (gastroesophageal reflux disease)   . Hypertension   . Joint pain   . Leg edema   . Morbidly obese ([place])   . Palpitations   . PCOS (polycystic ovarian syndrome)   . Prediabetes   . Vitamin D deficiency     PAST SURGICAL HISTORY: Past Surgical History:  Procedure Laterality Date  . TONSILECTOMY, ADENOIDECTOMY, BILATERAL MYRINGOTOMY AND  TUBES  1994    SOCIAL HISTORY: Social History   Tobacco Use  . Smoking status: Former Smoker    Types: Cigarettes  . Smokeless tobacco: Never Used  Substance Use Topics  . Alcohol use: Yes    Alcohol/week: 1.8 oz    Types: 3 Glasses of wine per week  . Drug use: No    Types: Marijuana    Comment: once every 6 months, taking CBD    FAMILY HISTORY: Family History  Problem Relation Age of Onset  . Hypertension Mother   . Hyperlipidemia Mother   . Obesity Mother   . Stroke Father   . Obesity Father   . Alcoholism Father   . Drug abuse Father   . Diabetes Maternal Uncle   . Heart disease Maternal Grandmother   . Colon cancer Neg Hx     ROS: Review  of Systems  Constitutional: Positive for malaise/fatigue and weight loss.  Gastrointestinal: Negative for nausea and vomiting.  Musculoskeletal:       Negative muscle weakness + RT ankle pain    PHYSICAL EXAM: Blood pressure 107/72, pulse 81, temperature 98 F (36.7 C), temperature source Oral, height 5\' 6"  (1.676 m), weight (!) 415 lb (188.2 kg), SpO2 98 %. Body mass index is 66.98 kg/m. Physical Exam  Constitutional: She is oriented to person, place, and time. She appears well-developed and well-nourished.  Cardiovascular: Normal rate.  Pulmonary/Chest: Effort normal.  Musculoskeletal: Normal range of motion.  Neurological: She is oriented to person, place, and time.  Skin: Skin is warm and dry.  Psychiatric: She has a normal mood and affect. Her behavior is normal.  Vitals reviewed.   RECENT LABS AND TESTS: BMET    Component Value Date/Time   NA 137 10/11/2017 1022   K 4.9 10/11/2017 1022   CL 101 10/11/2017 1022   CO2 20 10/11/2017 1022   GLUCOSE 106 (H) 10/11/2017 1022   GLUCOSE 114 (H) 09/13/2017 0929   BUN 10 10/11/2017 1022   CREATININE 0.72 10/11/2017 1022   CREATININE 0.67 09/28/2014 1146   CALCIUM 9.0 10/11/2017 1022   GFRNONAA 115 10/11/2017 1022   GFRAA 133 10/11/2017 1022   Lab Results  Component Value Date   HGBA1C 5.7 (H) 01/17/2018   HGBA1C 6.0 (H) 10/11/2017   HGBA1C 6.1 09/13/2017   HGBA1C 5.9 06/12/2017   HGBA1C 5.8 01/07/2016   Lab Results  Component Value Date   INSULIN 39.5 (H) 01/17/2018   INSULIN 73.6 (H) 10/11/2017   CBC    Component Value Date/Time   WBC 4.7 10/11/2017 1022   WBC 4.9 06/12/2017 1344   RBC 4.79 10/11/2017 1022   RBC 4.97 06/12/2017 1344   HGB 13.0 10/11/2017 1022   HCT 39.9 10/11/2017 1022   PLT 263.0 06/12/2017 1344   MCV 83 10/11/2017 1022   MCH 27.1 10/11/2017 1022   MCH 24.5 (L) 09/28/2014 1146   MCHC 32.6 10/11/2017 1022   MCHC 32.5 06/12/2017 1344   RDW 13.8 10/11/2017 1022   LYMPHSABS 1.7  10/11/2017 1022   MONOABS 0.2 06/12/2017 1344   EOSABS 0.1 10/11/2017 1022   BASOSABS 0.0 10/11/2017 1022   Iron/TIBC/Ferritin/ %Sat No results found for: IRON, TIBC, FERRITIN, IRONPCTSAT Lipid Panel     Component Value Date/Time   CHOL 143 10/11/2017 1022   TRIG 78 10/11/2017 1022   HDL 53 10/11/2017 1022   CHOLHDL 3 09/13/2017 0929   VLDL 16.4 09/13/2017 0929   LDLCALC 74 10/11/2017 1022   Hepatic  Function Panel     Component Value Date/Time   PROT 6.7 10/11/2017 1022   ALBUMIN 4.1 10/11/2017 1022   AST 11 10/11/2017 1022   ALT 15 10/11/2017 1022   ALKPHOS 52 10/11/2017 1022   BILITOT 0.3 10/11/2017 1022   BILIDIR 0.1 09/28/2014 1146   IBILI 0.4 09/28/2014 1146      Component Value Date/Time   TSH 1.880 10/11/2017 1022   TSH 1.98 06/12/2017 1344   TSH 1.49 04/11/2016 1627  Results for ETHELLE, OLA (MRN [id number]) as of 04/03/2018 08:50  Ref. Range 01/17/2018 11:12  Vitamin D, 25-Hydroxy Latest Ref Range: 30.0 - 100.0 ng/mL 44.7    ASSESSMENT AND PLAN: Vitamin D deficiency - Plan: Vitamin D, Ergocalciferol, (DRISDOL) 50000 units CAPS capsule  Insulin resistance  Chronic pain of right ankle  At risk for osteoporosis  Class 3 severe obesity with serious comorbidity and body mass index (BMI) of 60.0 to 69.9 in adult, unspecified obesity type ([place])  PLAN:  Vitamin D Deficiency Hetvi was informed that low vitamin D levels contributes to fatigue and are associated with obesity, breast, and colon cancer. Lydiah agrees to continue taking prescription Vit D @50 ,000 IU every week #4 and we will refill for 1 month. She will follow up for routine testing of vitamin D, at least 2-3 times per year. She was informed of the risk of over-replacement of vitamin D and agrees to not increase her dose unless she discusses this with [place] first. Haniya agrees to follow up with our clinic in 2 weeks.  At risk for osteopenia and osteoporosis Kenora is at risk for  osteopenia and osteoporsis due to her vitamin D deficiency. She was encouraged to take her vitamin D and follow her higher calcium diet and increase strengthening exercise to help strengthen her bones and decrease her risk of osteopenia and osteoporosis.  Insulin Resistance Bexleigh will continue to work on weight loss, exercise, and decreasing simple carbohydrates in her diet to help decrease the risk of diabetes. We dicussed metformin including benefits and risks. She was informed that eating too many simple carbohydrates or too many calories at one sitting increases the likelihood of GI side effects. Kenadie agrees to restart metformin and she agrees to follow up with our clinic in 2 weeks as directed to monitor her progress.  Right Ankle Pain Naila is to call Dr. Etter Sjogren and ask for referral to osteopathic manual medicine. Gillian agrees to follow up with our clinic in 2 weeks.  Obesity Rmani is currently in the action stage of change. As such, her goal is to continue with weight loss efforts She has agreed to follow a lower carbohydrate, vegetable and lean protein rich diet plan Arnell has been instructed to work up to a goal of 150 minutes of combined cardio and strengthening exercise per week for weight loss and overall health benefits. We discussed the following Behavioral Modification Strategies today: increasing lean protein intake, increasing vegetables, work on meal planning and easy cooking plans, and planning for success   Rosmary has agreed to follow up with our clinic in 2 weeks. She was informed of the importance of frequent follow up visits to maximize her success with intensive lifestyle modifications for her multiple health conditions.   OBESITY BEHAVIORAL INTERVENTION VISIT  Today's visit was # 9 out of 22.  Starting weight: 447 lbs Starting date: 10/11/17 Today's weight : 415 lbs  Today's date: 04/02/2018 Total lbs lost to date: 41    ASK: We discussed  the  diagnosis of obesity with [name] today and Shanekqua agreed to give [place] permission to discuss obesity behavioral modification therapy today.  ASSESS: Amani has the diagnosis of obesity and her BMI today is 67.01 Deonna is in the action stage of change   ADVISE: Kina was educated on the multiple health risks of obesity as well as the benefit of weight loss to improve her health. She was advised of the need for long term treatment and the importance of lifestyle modifications.  AGREE: Multiple dietary modification options and treatment options were discussed and  Lizeth agreed to the above obesity treatment plan.  I, Trixie Dredge, am acting as transcriptionist for Ilene Qua, MD  I have reviewed the above documentation for accuracy and completeness, and I agree with the above. - Ilene Qua, MD

## 2018-04-11 ENCOUNTER — Ambulatory Visit (HOSPITAL_BASED_OUTPATIENT_CLINIC_OR_DEPARTMENT_OTHER)
Admission: RE | Admit: 2018-04-11 | Discharge: 2018-04-11 | Disposition: A | Discharge status: Home / Self Care | Payer: BLUE CROSS/BLUE SHIELD | Source: Ambulatory Visit | Attending: Family Medicine | Admitting: Family Medicine

## 2018-04-11 ENCOUNTER — Ambulatory Visit (INDEPENDENT_AMBULATORY_CARE_PROVIDER_SITE_OTHER): Payer: BLUE CROSS/BLUE SHIELD | Admitting: Family Medicine

## 2018-04-11 ENCOUNTER — Encounter: Payer: Self-pay | Admitting: Family Medicine

## 2018-04-11 VITALS — BP 131/81 | HR 81 | Temp 98.5°F | Resp 16 | Ht 66.0 in | Wt >= 6400 oz

## 2018-04-11 DIAGNOSIS — G43809 Other migraine, not intractable, without status migrainosus: Secondary | ICD-10-CM

## 2018-04-11 DIAGNOSIS — M79671 Pain in right foot: Secondary | ICD-10-CM

## 2018-04-11 DIAGNOSIS — M25571 Pain in right ankle and joints of right foot: Secondary | ICD-10-CM

## 2018-04-11 DIAGNOSIS — M7731 Calcaneal spur, right foot: Secondary | ICD-10-CM | POA: Insufficient documentation

## 2018-04-11 DIAGNOSIS — E282 Polycystic ovarian syndrome: Secondary | ICD-10-CM | POA: Diagnosis not present

## 2018-04-11 MED ORDER — MELOXICAM 15 MG PO TABS: ORAL_TABLET | ORAL | 0 refills | Status: DC

## 2018-04-11 MED ORDER — SUMATRIPTAN SUCCINATE 100 MG PO TABS: 100.0000 mg | ORAL_TABLET | Freq: Two times a day (BID) | ORAL | 0 refills | Status: DC | PRN

## 2018-04-11 MED ORDER — NORETHIN ACE-ETH ESTRAD-FE 1-20 MG-MCG PO TABS: 1.0000 | ORAL_TABLET | Freq: Every day | ORAL | 3 refills | Status: DC

## 2018-04-11 NOTE — Progress Notes (Signed)
Patient ID: Brandy Saunders, female    DOB: May 24, 1991  Age: 27 y.o. MRN: [id number]    Subjective:  Subjective  HPI Brandy Saunders presents for pain in both ankles and feet but R >L.  She is standing all day on concreter floors in dress shoes.  No known inury. She has lost weight with healthy weight and wellness.  She is unable to work out because her feet hurt so bad.    Review of Systems  Constitutional: Negative for appetite change, diaphoresis, fatigue and unexpected weight change.  Eyes: Negative for pain, redness and visual disturbance.  Respiratory: Negative for cough, chest tightness, shortness of breath and wheezing.   Cardiovascular: Negative for chest pain, palpitations and leg swelling.  Endocrine: Negative for cold intolerance, heat intolerance, polydipsia, polyphagia and polyuria.  Genitourinary: Negative for difficulty urinating, dysuria and frequency.  Musculoskeletal: Positive for arthralgias and gait problem.  Neurological: Negative for dizziness, light-headedness, numbness and headaches.    History Past Medical History:  Diagnosis Date  . Anxiety   . Asthma   . Back pain   . Binge eating   . Carpal tunnel syndrome, bilateral   . Chest pain   . Common migraine with intractable migraine 02/02/2017  . Constipation   . Depression   . Dyspnea   . Fatty liver   . GERD (gastroesophageal reflux disease)   . Hypertension   . Joint pain   . Leg edema   . Morbidly obese ([place])   . Palpitations   . PCOS (polycystic ovarian syndrome)   . Prediabetes   . Vitamin D deficiency     She has a past surgical history that includes Tonsilectomy, adenoidectomy, bilateral myringotomy and tubes (1994).   Her family history includes Alcoholism in her father; Diabetes in her maternal uncle; Drug abuse in her father; Heart disease in her maternal grandmother; Hyperlipidemia in her mother; Hypertension in her mother; Obesity in her father and mother; Stroke in her father.She  reports that she has quit smoking. Her smoking use included cigarettes. She has never used smokeless tobacco. She reports that she drinks about 3.0 standard drinks of alcohol per week. She reports that she does not use drugs.  Current Outpatient Medications on File Prior to Visit  Medication Sig Dispense Refill  . acetaminophen (TYLENOL) 500 MG tablet Take 500 mg by mouth every 6 (six) hours as needed.    . Ascorbic Acid (VITAMIN C) 1000 MG tablet Take 1,000 mg by mouth daily.    Marland Kitchen BIOTIN 5000 PO Take 10,000 mg by mouth daily.     . Cyanocobalamin (VITAMIN B 12 PO) Take 2,500 mcg by mouth daily.    . metFORMIN (GLUCOPHAGE) 500 MG tablet Take 1 tablet (500 mg total) by mouth daily with breakfast. 90 tablet 0  . metoprolol succinate (TOPROL XL) 50 MG 24 hr tablet Take 2 tab po qd  with or immediately following a meal. 180 tablet 2  . Multiple Vitamins-Calcium (ONE-A-DAY WOMENS PO) Take 1 tablet by mouth daily.     . Vitamin D, Ergocalciferol, (DRISDOL) 50000 units CAPS capsule Take 1 capsule (50,000 Units total) by mouth every 7 (seven) days. 4 capsule 0   No current facility-administered medications on file prior to visit.      Objective:  Objective  Physical Exam  Constitutional: She is oriented to person, place, and time. She appears well-developed and well-nourished.  HENT:  Head: Normocephalic and atraumatic.  Eyes: Conjunctivae and EOM are normal.  Neck:  Normal range of motion. Neck supple. No JVD present. Carotid bruit is not present. No thyromegaly present.  Cardiovascular: Normal rate, regular rhythm and normal heart sounds.  No murmur heard. Pulmonary/Chest: Effort normal and breath sounds normal. No respiratory distress. She has no wheezes. She has no rales. She exhibits no tenderness.  Musculoskeletal: She exhibits edema. She exhibits no tenderness or deformity.  No pain with palpation -- only with weight bearing + swelling in both ankles R>L  Neurological: She is alert and  oriented to person, place, and time.  Psychiatric: She has a normal mood and affect.  Nursing note and vitals reviewed.  BP 131/81 (BP Location: Right Arm, Cuff Size: Normal)   Pulse 81   Temp 98.5 F (36.9 C) (Oral)   Resp 16   Ht 5\' 6"  (1.676 m)   Wt (!) 422 lb 3.2 oz (191.5 kg)   LMP 03/28/2018   SpO2 100%   BMI 68.14 kg/m  Wt Readings from Last 3 Encounters:  04/11/18 (!) 422 lb 3.2 oz (191.5 kg)  04/02/18 (!) 415 lb (188.2 kg)  02/19/18 (!) 420 lb (190.5 kg)     Lab Results  Component Value Date   WBC 4.7 10/11/2017   HGB 13.0 10/11/2017   HCT 39.9 10/11/2017   PLT 263.0 06/12/2017   GLUCOSE 106 (H) 10/11/2017   CHOL 143 10/11/2017   TRIG 78 10/11/2017   HDL 53 10/11/2017   LDLCALC 74 10/11/2017   ALT 15 10/11/2017   AST 11 10/11/2017   NA 137 10/11/2017   K 4.9 10/11/2017   CL 101 10/11/2017   CREATININE 0.72 10/11/2017   BUN 10 10/11/2017   CO2 20 10/11/2017   TSH 1.880 10/11/2017   HGBA1C 5.7 (H) 01/17/2018    US Venous Img Lower Unilateral Right  Result Date: 01/31/2018 CLINICAL DATA:  Right calf pain and edema EXAM: RIGHT LOWER EXTREMITY VENOUS DOPPLER ULTRASOUND TECHNIQUE: Gray-scale sonography with graded compression, as well as color Doppler and duplex ultrasound were performed to evaluate the lower extremity deep venous systems from the level of the common femoral vein and including the common femoral, femoral, profunda femoral, popliteal and calf veins including the posterior tibial, peroneal and gastrocnemius veins when visible. The superficial great saphenous vein was also interrogated. Spectral Doppler was utilized to evaluate flow at rest and with distal augmentation maneuvers in the common femoral, femoral and popliteal veins. COMPARISON:  None. FINDINGS: Contralateral Common Femoral Vein: Respiratory phasicity is normal and symmetric with the symptomatic side. No evidence of thrombus. Normal compressibility. Common Femoral Vein: No evidence of  thrombus. Normal compressibility, respiratory phasicity and response to augmentation. Saphenofemoral Junction: No evidence of thrombus. Normal compressibility and flow on color Doppler imaging. Profunda Femoral Vein: No evidence of thrombus. Normal compressibility and flow on color Doppler imaging. Femoral Vein: No evidence of thrombus. Normal compressibility, respiratory phasicity and response to augmentation. Popliteal Vein: No evidence of thrombus. Normal compressibility, respiratory phasicity and response to augmentation. Calf Veins: No evidence of thrombus. Normal compressibility and flow on color Doppler imaging. Superficial Great Saphenous Vein: No evidence of thrombus. Normal compressibility. Venous Reflux:  None. Other Findings:  None. IMPRESSION: No evidence of deep venous thrombosis. Electronically Signed   By: Jerilynn Mages.  Shick M.D.   On: 01/31/2018 09:52     Assessment & Plan:  Plan  I have changed Madden L. Campbell's BLISOVI FE 1/20 to norethindrone-ethinyl estradiol. I am also having her start on meloxicam. Additionally, I am having her maintain her  Multiple Vitamins-Calcium (ONE-A-DAY WOMENS PO), BIOTIN 5000 PO, vitamin C, acetaminophen, Cyanocobalamin (VITAMIN B 12 PO), metoprolol succinate, metFORMIN, Vitamin D (Ergocalciferol), and SUMAtriptan.  Meds ordered this encounter  Medications  . norethindrone-ethinyl estradiol (BLISOVI FE 1/20) 1-20 MG-MCG tablet    Sig: Take 1 tablet by mouth daily.    Dispense:  84 tablet    Refill:  3  . SUMAtriptan (IMITREX) 100 MG tablet    Sig: Take 1 tablet (100 mg total) by mouth 2 (two) times daily as needed for migraine.    Dispense:  60 tablet    Refill:  0  . meloxicam (MOBIC) 15 MG tablet    Sig: 1 po qd prn    Dispense:  30 tablet    Refill:  0    Problem List Items Addressed This Visit      Unprioritized   Migraine   Relevant Medications   SUMAtriptan (IMITREX) 100 MG tablet   meloxicam (MOBIC) 15 MG tablet   PCOS (polycystic  ovarian syndrome) (Chronic)   Relevant Medications   norethindrone-ethinyl estradiol (BLISOVI FE 1/20) 1-20 MG-MCG tablet    Other Visit Diagnoses    Foot pain, right    -  Primary   Relevant Medications   meloxicam (MOBIC) 15 MG tablet   Other Relevant Orders   DG Foot Complete Right   DG Ankle Complete Right   Acute right ankle pain       Relevant Medications   meloxicam (MOBIC) 15 MG tablet    elevate feet Ice prn Antiinflammatory Sport med vs ortho prn   Follow-up: Return if symptoms worsen or fail to improve.  Ann Held, DO

## 2018-04-11 NOTE — Patient Instructions (Signed)
Ankle Pain Many things can cause ankle pain, including an injury to the area and overuse of the ankle.The ankle joint holds your body weight and allows you to move around. Ankle pain can occur on either side or the back of one ankle or both ankles. Ankle pain may be sharp and burning or dull and aching. There may be tenderness, stiffness, redness, or warmth around the ankle. Follow these instructions at home: Activity  Rest your ankle as told by your health care provider. Avoid any activities that cause ankle pain.  Do exercises as told by your health care provider.  Ask your health care provider if you can drive. Using a brace, a bandage, or crutches  If you were given a brace: ? Wear it as told by your health care provider. ? Remove it when you take a bath or a shower. ? Try not to move your ankle very much, but wiggle your toes from time to time. This helps to prevent swelling.  If you were given an elastic bandage: ? Remove it when you take a bath or a shower. ? Try not to move your ankle very much, but wiggle your toes from time to time. This helps to prevent swelling. ? Adjust the bandage to make it more comfortable if it feels too tight. ? Loosen the bandage if you have numbness or tingling in your foot or if your foot turns cold and blue.  If you have crutches, use them as told by your health care provider. Continue to use them until you can walk without feeling pain in your ankle. Managing pain, stiffness, and swelling  Raise (elevate) your ankle above the level of your heart while you are sitting or lying down.  If directed, apply ice to the area: ? Put ice in a plastic bag. ? Place a towel between your skin and the bag. ? Leave the ice on for 20 minutes, 2-3 times per day. General instructions  Keep all follow-up visits as told by your health care provider. This is important.  Record this information that may be helpful for you and your health care provider: ? How  often you have ankle pain. ? Where the pain is located. ? What the pain feels like.  Take over-the-counter and prescription medicines only as told by your health care provider. Contact a health care provider if:  Your pain gets worse.  Your pain is not relieved with medicines.  You have a fever or chills.  You are having more trouble with walking.  You have new symptoms. Get help right away if:  Your foot, leg, toes, or ankle tingles or becomes numb.  Your foot, leg, toes, or ankle becomes swollen.  Your foot, leg, toes, or ankle turns pale or blue. This information is not intended to replace advice given to you by your health care provider. Make sure you discuss any questions you have with your health care provider. Document Released: 02/01/2010 Document Revised: 04/14/2016 Document Reviewed: 03/16/2015 Elsevier Interactive Patient Education  2018 Elsevier Inc.  

## 2018-04-18 ENCOUNTER — Ambulatory Visit (INDEPENDENT_AMBULATORY_CARE_PROVIDER_SITE_OTHER): Payer: BLUE CROSS/BLUE SHIELD | Admitting: Family Medicine

## 2018-04-18 ENCOUNTER — Encounter (INDEPENDENT_AMBULATORY_CARE_PROVIDER_SITE_OTHER): Payer: Self-pay | Admitting: Family Medicine

## 2018-04-18 ENCOUNTER — Ambulatory Visit: Payer: Managed Care, Other (non HMO) | Admitting: Family Medicine

## 2018-04-18 VITALS — BP 122/76 | HR 78 | Temp 98.5°F | Ht 66.0 in | Wt >= 6400 oz

## 2018-04-18 DIAGNOSIS — R7303 Prediabetes: Secondary | ICD-10-CM | POA: Diagnosis not present

## 2018-04-18 DIAGNOSIS — Z6841 Body Mass Index (BMI) 40.0 and over, adult: Secondary | ICD-10-CM

## 2018-04-18 DIAGNOSIS — E559 Vitamin D deficiency, unspecified: Secondary | ICD-10-CM

## 2018-04-18 NOTE — Progress Notes (Signed)
Office: [phone]  /  Fax: [phone]   HPI:   Chief Complaint: OBESITY Brandy Saunders is here to discuss her progress with her obesity treatment plan. She is on the lower carbohydrate, vegetable and lean protein rich diet plan and is following her eating plan approximately 85 % of the time. She states she is exercising 0 minutes 0 times per week. Brandy Saunders is enjoying the low carbohydrate plan. She is doing a mix of her own low carbohydrate and low carbohydrate plan from our clinic.  Her weight is (!) 413 lb (187.3 kg) today and has had a weight loss of 2 pounds over a period of 2 weeks since her last visit. She has lost 34 lbs since starting treatment with [place].  Vitamin D Deficiency Brandy Saunders has a diagnosis of vitamin D deficiency. She is currently taking prescription Vit D. She notes fatigue and denies nausea, vomiting or muscle weakness.  Pre-Diabetes Brandy Saunders has a diagnosis of pre-diabetes based on her elevated Hgb A1c and was informed this puts her at greater risk of developing diabetes. She was previously on metformin, but she stopped secondary to concern of change in diet and new job. She continues to work on diet and exercise to decrease risk of diabetes. She denies nausea or hypoglycemia.  ALLERGIES: Allergies  Allergen Reactions  . Ibuprofen Diarrhea  . Metformin And Related Diarrhea    MEDICATIONS: Current Outpatient Medications on File Prior to Visit  Medication Sig Dispense Refill  . acetaminophen (TYLENOL) 500 MG tablet Take 500 mg by mouth every 6 (six) hours as needed.    . Ascorbic Acid (VITAMIN C) 1000 MG tablet Take 1,000 mg by mouth daily.    Marland Kitchen BIOTIN 5000 PO Take 10,000 mg by mouth daily.     . Cyanocobalamin (VITAMIN B 12 PO) Take 2,500 mcg by mouth daily.    . meloxicam (MOBIC) 15 MG tablet 1 po qd prn 30 tablet 0  . metFORMIN (GLUCOPHAGE) 500 MG tablet Take 1 tablet (500 mg total) by mouth daily with breakfast. 90 tablet 0  . metoprolol succinate (TOPROL XL)  50 MG 24 hr tablet Take 2 tab po qd  with or immediately following a meal. 180 tablet 2  . Multiple Vitamins-Calcium (ONE-A-DAY WOMENS PO) Take 1 tablet by mouth daily.     . norethindrone-ethinyl estradiol (BLISOVI FE 1/20) 1-20 MG-MCG tablet Take 1 tablet by mouth daily. 84 tablet 3  . SUMAtriptan (IMITREX) 100 MG tablet Take 1 tablet (100 mg total) by mouth 2 (two) times daily as needed for migraine. 60 tablet 0  . Vitamin D, Ergocalciferol, (DRISDOL) 50000 units CAPS capsule Take 1 capsule (50,000 Units total) by mouth every 7 (seven) days. 4 capsule 0   No current facility-administered medications on file prior to visit.     PAST MEDICAL HISTORY: Past Medical History:  Diagnosis Date  . Anxiety   . Asthma   . Back pain   . Binge eating   . Carpal tunnel syndrome, bilateral   . Chest pain   . Common migraine with intractable migraine 02/02/2017  . Constipation   . Depression   . Dyspnea   . Fatty liver   . GERD (gastroesophageal reflux disease)   . Hypertension   . Joint pain   . Leg edema   . Morbidly obese ([place])   . Palpitations   . PCOS (polycystic ovarian syndrome)   . Prediabetes   . Vitamin D deficiency     PAST SURGICAL HISTORY: Past Surgical  History:  Procedure Laterality Date  . TONSILECTOMY, ADENOIDECTOMY, BILATERAL MYRINGOTOMY AND TUBES  1994    SOCIAL HISTORY: Social History   Tobacco Use  . Smoking status: Former Smoker    Types: Cigarettes  . Smokeless tobacco: Never Used  Substance Use Topics  . Alcohol use: Yes    Alcohol/week: 3.0 standard drinks    Types: 3 Glasses of wine per week  . Drug use: No    Types: Marijuana    Comment: once every 6 months, taking CBD    FAMILY HISTORY: Family History  Problem Relation Age of Onset  . Hypertension Mother   . Hyperlipidemia Mother   . Obesity Mother   . Stroke Father   . Obesity Father   . Alcoholism Father   . Drug abuse Father   . Diabetes Maternal Uncle   . Heart disease Maternal  Grandmother   . Colon cancer Neg Hx     ROS: Review of Systems  Constitutional: Positive for malaise/fatigue and weight loss.  Gastrointestinal: Negative for nausea and vomiting.  Musculoskeletal:       Negative muscle weakness  Endo/Heme/Allergies:       Negative hypoglycemia    PHYSICAL EXAM: Blood pressure 122/76, pulse 78, temperature 98.5 F (36.9 C), temperature source Oral, height 5\' 6"  (1.676 m), weight (!) 413 lb (187.3 kg), last menstrual period 03/28/2018, SpO2 98 %. Body mass index is 66.66 kg/m. Physical Exam  Constitutional: She is oriented to person, place, and time. She appears well-developed and well-nourished.  Cardiovascular: Normal rate.  Pulmonary/Chest: Effort normal.  Musculoskeletal: Normal range of motion.  Neurological: She is oriented to person, place, and time.  Skin: Skin is warm and dry.  Psychiatric: She has a normal mood and affect. Her behavior is normal.  Vitals reviewed.   RECENT LABS AND TESTS: BMET    Component Value Date/Time   NA 137 10/11/2017 1022   K 4.9 10/11/2017 1022   CL 101 10/11/2017 1022   CO2 20 10/11/2017 1022   GLUCOSE 106 (H) 10/11/2017 1022   GLUCOSE 114 (H) 09/13/2017 0929   BUN 10 10/11/2017 1022   CREATININE 0.72 10/11/2017 1022   CREATININE 0.67 09/28/2014 1146   CALCIUM 9.0 10/11/2017 1022   GFRNONAA 115 10/11/2017 1022   GFRAA 133 10/11/2017 1022   Lab Results  Component Value Date   HGBA1C 5.7 (H) 01/17/2018   HGBA1C 6.0 (H) 10/11/2017   HGBA1C 6.1 09/13/2017   HGBA1C 5.9 06/12/2017   HGBA1C 5.8 01/07/2016   Lab Results  Component Value Date   INSULIN 39.5 (H) 01/17/2018   INSULIN 73.6 (H) 10/11/2017   CBC    Component Value Date/Time   WBC 4.7 10/11/2017 1022   WBC 4.9 06/12/2017 1344   RBC 4.79 10/11/2017 1022   RBC 4.97 06/12/2017 1344   HGB 13.0 10/11/2017 1022   HCT 39.9 10/11/2017 1022   PLT 263.0 06/12/2017 1344   MCV 83 10/11/2017 1022   MCH 27.1 10/11/2017 1022   MCH 24.5 (L)  09/28/2014 1146   MCHC 32.6 10/11/2017 1022   MCHC 32.5 06/12/2017 1344   RDW 13.8 10/11/2017 1022   LYMPHSABS 1.7 10/11/2017 1022   MONOABS 0.2 06/12/2017 1344   EOSABS 0.1 10/11/2017 1022   BASOSABS 0.0 10/11/2017 1022   Iron/TIBC/Ferritin/ %Sat No results found for: IRON, TIBC, FERRITIN, IRONPCTSAT Lipid Panel     Component Value Date/Time   CHOL 143 10/11/2017 1022   TRIG 78 10/11/2017 1022   HDL 53  10/11/2017 1022   CHOLHDL 3 09/13/2017 0929   VLDL 16.4 09/13/2017 0929   LDLCALC 74 10/11/2017 1022   Hepatic Function Panel     Component Value Date/Time   PROT 6.7 10/11/2017 1022   ALBUMIN 4.1 10/11/2017 1022   AST 11 10/11/2017 1022   ALT 15 10/11/2017 1022   ALKPHOS 52 10/11/2017 1022   BILITOT 0.3 10/11/2017 1022   BILIDIR 0.1 09/28/2014 1146   IBILI 0.4 09/28/2014 1146      Component Value Date/Time   TSH 1.880 10/11/2017 1022   TSH 1.98 06/12/2017 1344   TSH 1.49 04/11/2016 1627  Results for JAMARIYAH, JOHANNSEN (MRN [id number]) as of 04/18/2018 16:21  Ref. Range 01/17/2018 11:12  Vitamin D, 25-Hydroxy Latest Ref Range: 30.0 - 100.0 ng/mL 44.7    ASSESSMENT AND PLAN: Vitamin D deficiency  Prediabetes  Class 3 severe obesity with serious comorbidity and body mass index (BMI) of 60.0 to 69.9 in adult, unspecified obesity type ([place])  PLAN:  Vitamin D Deficiency Brandy Saunders was informed that low vitamin D levels contributes to fatigue and are associated with obesity, breast, and colon cancer. Brandy Saunders agrees to continue taking prescription Vit D @50 ,000 IU every week, no refill needed. She will follow up for routine testing of vitamin D, at least 2-3 times per year. She was informed of the risk of over-replacement of vitamin D and agrees to not increase her dose unless she discusses this with [place] first. Brandy Saunders agrees to follow up with our clinic in 2 weeks.  Pre-Diabetes Brandy Saunders will continue to work on weight loss, exercise, and decreasing simple carbohydrates  in her diet to help decrease the risk of diabetes. We dicussed metformin including benefits and risks. She was informed that eating too many simple carbohydrates or too many calories at one sitting increases the likelihood of GI side effects. Brandy Saunders agrees to restart metformin and she agrees to follow up with our clinic in 2 weeks as directed to monitor her progress.  We spent > than 50% of the 15 minute visit on the counseling as documented in the note.  Obesity Brandy Saunders is currently in the action stage of change. As such, her goal is to continue with weight loss efforts She has agreed to follow a lower carbohydrate, vegetable and lean protein rich diet plan Brandy Saunders has been instructed to work up to a goal of 150 minutes of combined cardio and strengthening exercise per week for weight loss and overall health benefits. We discussed the following Behavioral Modification Strategies today: increasing lean protein intake, increasing vegetables, work on meal planning and easy cooking plans, and planning for success   Brandy Saunders has agreed to follow up with our clinic in 2 weeks. She was informed of the importance of frequent follow up visits to maximize her success with intensive lifestyle modifications for her multiple health conditions.   OBESITY BEHAVIORAL INTERVENTION VISIT  Today's visit was # 10.   Starting weight: 447 lbs Starting date: 10/11/17 Today's weight : 413 lbs Today's date: 04/18/2018 Total lbs lost to date: 34 At least 15 minutes were spent on discussing the following behavioral intervention visit.   ASK: We discussed the diagnosis of obesity with Brandy Saunders today and Brandy Saunders agreed to give [place] permission to discuss obesity behavioral modification therapy today.  ASSESS: Brandy Saunders has the diagnosis of obesity and her BMI today is 66.69 Brandy Saunders is in the action stage of change   ADVISE: Brandy Saunders was educated on the multiple health risks of obesity  as well as  the benefit of weight loss to improve her health. She was advised of the need for long term treatment and the importance of lifestyle modifications to improve her current health and to decrease her risk of future health problems.  AGREE: Multiple dietary modification options and treatment options were discussed and  Brandy Saunders agreed to follow the recommendations documented in the above note.  ARRANGE: Brandy Saunders was educated on the importance of frequent visits to treat obesity as outlined per CMS and USPSTF guidelines and agreed to schedule her next follow up appointment today.  I, Brandy Saunders, am acting as transcriptionist for Ilene Qua, MD  I have reviewed the above documentation for accuracy and completeness, and I agree with the above. - Ilene Qua, MD

## 2018-04-19 ENCOUNTER — Encounter: Payer: Self-pay | Admitting: Family Medicine

## 2018-04-23 NOTE — Telephone Encounter (Signed)
Ok to refer to podiatry For plantar fascitis and heel spur Spur is extra bone growth

## 2018-04-24 ENCOUNTER — Other Ambulatory Visit: Payer: Self-pay | Admitting: *Deleted

## 2018-04-24 DIAGNOSIS — M722 Plantar fascial fibromatosis: Secondary | ICD-10-CM

## 2018-04-24 DIAGNOSIS — M7731 Calcaneal spur, right foot: Secondary | ICD-10-CM

## 2018-05-02 ENCOUNTER — Ambulatory Visit: Payer: BLUE CROSS/BLUE SHIELD

## 2018-05-02 ENCOUNTER — Encounter: Payer: Self-pay | Admitting: Podiatry

## 2018-05-02 ENCOUNTER — Ambulatory Visit: Payer: BLUE CROSS/BLUE SHIELD | Admitting: Podiatry

## 2018-05-02 ENCOUNTER — Ambulatory Visit (INDEPENDENT_AMBULATORY_CARE_PROVIDER_SITE_OTHER): Payer: BLUE CROSS/BLUE SHIELD | Admitting: Bariatrics

## 2018-05-02 ENCOUNTER — Encounter (INDEPENDENT_AMBULATORY_CARE_PROVIDER_SITE_OTHER): Payer: Self-pay | Admitting: Bariatrics

## 2018-05-02 VITALS — BP 108/62 | HR 81 | Temp 98.1°F | Ht 66.0 in | Wt >= 6400 oz

## 2018-05-02 VITALS — Ht 66.0 in | Wt >= 6400 oz

## 2018-05-02 DIAGNOSIS — E559 Vitamin D deficiency, unspecified: Secondary | ICD-10-CM | POA: Diagnosis not present

## 2018-05-02 DIAGNOSIS — M79671 Pain in right foot: Secondary | ICD-10-CM

## 2018-05-02 DIAGNOSIS — R7303 Prediabetes: Secondary | ICD-10-CM | POA: Diagnosis not present

## 2018-05-02 DIAGNOSIS — M779 Enthesopathy, unspecified: Secondary | ICD-10-CM | POA: Diagnosis not present

## 2018-05-02 DIAGNOSIS — Z9189 Other specified personal risk factors, not elsewhere classified: Secondary | ICD-10-CM | POA: Diagnosis not present

## 2018-05-02 DIAGNOSIS — L600 Ingrowing nail: Secondary | ICD-10-CM

## 2018-05-02 DIAGNOSIS — Z6841 Body Mass Index (BMI) 40.0 and over, adult: Secondary | ICD-10-CM

## 2018-05-02 DIAGNOSIS — M7672 Peroneal tendinitis, left leg: Secondary | ICD-10-CM | POA: Diagnosis not present

## 2018-05-02 MED ORDER — TRIAMCINOLONE ACETONIDE 10 MG/ML IJ SUSP
10.0000 mg | Freq: Once | INTRAMUSCULAR | Status: AC
Start: 2018-05-02 — End: 2018-05-02
  Administered 2018-05-02: 10 mg

## 2018-05-02 MED ORDER — TRIAMCINOLONE ACETONIDE 10 MG/ML IJ SUSP
10.0000 mg | Freq: Once | INTRAMUSCULAR | Status: AC
  Administered 2018-05-02: 10 mg

## 2018-05-02 MED ORDER — VITAMIN D (ERGOCALCIFEROL) 1.25 MG (50000 UNIT) PO CAPS: 50000.0000 [IU] | ORAL_CAPSULE | ORAL | 0 refills | Status: DC

## 2018-05-02 NOTE — Progress Notes (Addendum)
Office: [phone]  /  Fax: [phone]   HPI:   Chief Complaint: OBESITY Brandy Saunders is here to discuss her progress with her obesity treatment plan. She is on the lower carbohydrate, vegetable and lean protein rich diet plan and is following her eating plan approximately 85 % of the time. She states she is exercising 0 minutes 0 times per week. Brandy Saunders is more stressed and doing more stress (binge) eating. She does binge occasionally once a week. She states it is getting easier to cut down on carbohydrates. She is not having increased hunger and has increased protein. Her weight is (!) 412 lb (186.9 kg) today and has had a weight loss of 1 pounds over a period of 2 weeks since her last visit. She has lost 35 lbs since starting treatment with [place].  Vitamin D deficiency Brandy Saunders has a diagnosis of vitamin D deficiency. She is currently taking vit D and denies nausea, vomiting or muscle weakness.  Pre-Diabetes Brandy Saunders has a diagnosis of prediabetes based on her elevated Hgb A1c of 5.7 and her insulin was 39.5 on 01/17/18. She was informed this puts her at greater risk of developing diabetes. She is taking metformin currently and continues to work on diet and exercise to decrease risk of diabetes. She denies nausea or hypoglycemia.  At risk for cardiovascular disease Brandy Saunders is at a higher than average risk for cardiovascular disease due to prediabetes and obesity.   ALLERGIES: Allergies  Allergen Reactions  . Ibuprofen Diarrhea  . Meloxicam     Pt stated, "It made my emotions out of wack; it made my left hand numb"  . Metformin And Related Diarrhea    MEDICATIONS: Current Outpatient Medications on File Prior to Visit  Medication Sig Dispense Refill  . acetaminophen (TYLENOL) 500 MG tablet Take 500 mg by mouth every 6 (six) hours as needed.    . Ascorbic Acid (VITAMIN C) 1000 MG tablet Take 1,000 mg by mouth daily.    Marland Kitchen BIOTIN 5000 PO Take 10,000 mg by mouth daily.     .  Cyanocobalamin (VITAMIN B 12 PO) Take 2,500 mcg by mouth daily.    . meloxicam (MOBIC) 15 MG tablet 1 po qd prn 30 tablet 0  . metFORMIN (GLUCOPHAGE) 500 MG tablet Take 1 tablet (500 mg total) by mouth daily with breakfast. 90 tablet 0  . metoprolol succinate (TOPROL XL) 50 MG 24 hr tablet Take 2 tab po qd  with or immediately following a meal. 180 tablet 2  . Multiple Vitamins-Calcium (ONE-A-DAY WOMENS PO) Take 1 tablet by mouth daily.     . norethindrone-ethinyl estradiol (BLISOVI FE 1/20) 1-20 MG-MCG tablet Take 1 tablet by mouth daily. 84 tablet 3  . SUMAtriptan (IMITREX) 100 MG tablet Take 1 tablet (100 mg total) by mouth 2 (two) times daily as needed for migraine. 60 tablet 0   No current facility-administered medications on file prior to visit.     PAST MEDICAL HISTORY: Past Medical History:  Diagnosis Date  . Anxiety   . Asthma   . Back pain   . Binge eating   . Carpal tunnel syndrome, bilateral   . Chest pain   . Common migraine with intractable migraine 02/02/2017  . Constipation   . Depression   . Dyspnea   . Fatty liver   . GERD (gastroesophageal reflux disease)   . Hypertension   . Joint pain   . Leg edema   . Morbidly obese ([place])   . Palpitations   .  PCOS (polycystic ovarian syndrome)   . Prediabetes   . Vitamin D deficiency     PAST SURGICAL HISTORY: Past Surgical History:  Procedure Laterality Date  . TONSILECTOMY, ADENOIDECTOMY, BILATERAL MYRINGOTOMY AND TUBES  1994    SOCIAL HISTORY: Social History   Tobacco Use  . Smoking status: Former Smoker    Types: Cigarettes  . Smokeless tobacco: Never Used  Substance Use Topics  . Alcohol use: Yes    Alcohol/week: 3.0 standard drinks    Types: 3 Glasses of wine per week  . Drug use: No    Types: Marijuana    Comment: once every 6 months, taking CBD    FAMILY HISTORY: Family History  Problem Relation Age of Onset  . Hypertension Mother   . Hyperlipidemia Mother   . Obesity Mother   . Stroke Father    . Obesity Father   . Alcoholism Father   . Drug abuse Father   . Diabetes Maternal Uncle   . Heart disease Maternal Grandmother   . Colon cancer Neg Hx     ROS: Review of Systems  Respiratory:       Negative for muscle weakness.  Gastrointestinal: Negative for nausea and vomiting.  Endo/Heme/Allergies:       Negative for hypoglycemia. Negative for polyphagia.    PHYSICAL EXAM: Blood pressure 108/62, pulse 81, temperature 98.1 F (36.7 C), temperature source Oral, height 5\' 6"  (1.676 m), weight (!) 412 lb (186.9 kg), last menstrual period 04/30/2018, SpO2 99 %. Body mass index is 66.5 kg/m. Physical Exam  Constitutional: She is oriented to person, place, and time. She appears well-developed and well-nourished.  Cardiovascular: Normal rate.  Pulmonary/Chest: Effort normal.  Musculoskeletal: Normal range of motion.  Neurological: She is oriented to person, place, and time.  Skin: Skin is warm and dry.  Psychiatric: She has a normal mood and affect. Her behavior is normal.  Vitals reviewed.   RECENT LABS AND TESTS: BMET    Component Value Date/Time   NA 137 10/11/2017 1022   K 4.9 10/11/2017 1022   CL 101 10/11/2017 1022   CO2 20 10/11/2017 1022   GLUCOSE 106 (H) 10/11/2017 1022   GLUCOSE 114 (H) 09/13/2017 0929   BUN 10 10/11/2017 1022   CREATININE 0.72 10/11/2017 1022   CREATININE 0.67 09/28/2014 1146   CALCIUM 9.0 10/11/2017 1022   GFRNONAA 115 10/11/2017 1022   GFRAA 133 10/11/2017 1022   Lab Results  Component Value Date   HGBA1C 5.7 (H) 01/17/2018   HGBA1C 6.0 (H) 10/11/2017   HGBA1C 6.1 09/13/2017   HGBA1C 5.9 06/12/2017   HGBA1C 5.8 01/07/2016   Lab Results  Component Value Date   INSULIN 39.5 (H) 01/17/2018   INSULIN 73.6 (H) 10/11/2017   CBC    Component Value Date/Time   WBC 4.7 10/11/2017 1022   WBC 4.9 06/12/2017 1344   RBC 4.79 10/11/2017 1022   RBC 4.97 06/12/2017 1344   HGB 13.0 10/11/2017 1022   HCT 39.9 10/11/2017 1022   PLT  263.0 06/12/2017 1344   MCV 83 10/11/2017 1022   MCH 27.1 10/11/2017 1022   MCH 24.5 (L) 09/28/2014 1146   MCHC 32.6 10/11/2017 1022   MCHC 32.5 06/12/2017 1344   RDW 13.8 10/11/2017 1022   LYMPHSABS 1.7 10/11/2017 1022   MONOABS 0.2 06/12/2017 1344   EOSABS 0.1 10/11/2017 1022   BASOSABS 0.0 10/11/2017 1022   Iron/TIBC/Ferritin/ %Sat No results found for: IRON, TIBC, FERRITIN, IRONPCTSAT Lipid Panel  Component Value Date/Time   CHOL 143 10/11/2017 1022   TRIG 78 10/11/2017 1022   HDL 53 10/11/2017 1022   CHOLHDL 3 09/13/2017 0929   VLDL 16.4 09/13/2017 0929   LDLCALC 74 10/11/2017 1022   Hepatic Function Panel     Component Value Date/Time   PROT 6.7 10/11/2017 1022   ALBUMIN 4.1 10/11/2017 1022   AST 11 10/11/2017 1022   ALT 15 10/11/2017 1022   ALKPHOS 52 10/11/2017 1022   BILITOT 0.3 10/11/2017 1022   BILIDIR 0.1 09/28/2014 1146   IBILI 0.4 09/28/2014 1146      Component Value Date/Time   TSH 1.880 10/11/2017 1022   TSH 1.98 06/12/2017 1344   TSH 1.49 04/11/2016 1627   Results for GIFT, RUECKERT (MRN [id number]) as of 05/02/2018 16:13  Ref. Range 01/17/2018 11:12  Vitamin D, 25-Hydroxy Latest Ref Range: 30.0 - 100.0 ng/mL 44.7    ASSESSMENT AND PLAN: Vitamin D deficiency - Plan: VITAMIN D 25 Hydroxy (Vit-D Deficiency, Fractures), Vitamin D, Ergocalciferol, (DRISDOL) 50000 units CAPS capsule  Prediabetes - Plan: Comprehensive metabolic panel, Hemoglobin A1c, Insulin, random  At risk for heart disease  Class 3 severe obesity with serious comorbidity and body mass index (BMI) of 60.0 to 69.9 in adult, unspecified obesity type (HCC)  PLAN:  Vitamin D Deficiency Alvilda was informed that low vitamin D levels contributes to fatigue and are associated with obesity, breast, and colon cancer. She agrees to continue to take prescription Vit D @50 ,000 IU every week #4 with no refills and will follow up for routine testing of vitamin D, at least 2-3 times per  year. She was informed of the risk of over-replacement of vitamin D and agrees to not increase her dose unless she discusses this with [place] first. Estreya agreed to continue to increase vitamin D rich foods and follow up in 2 weeks.   Pre-Diabetes Brandy Saunders will continue to work on weight loss, exercise, and decreasing simple carbohydrates in her diet to help decrease the risk of diabetes. She was informed that eating too many simple carbohydrates or too many calories at one sitting increases the likelihood of GI side effects. Brandy Saunders will not resume metformin at this time. She will restart when she has days off from work and decrease carbohydrates when taking metformin. Brandy Saunders agreed to follow up with [place] as directed to monitor her progress.  Cardiovascular risk counseling Brandy Saunders was given extended (15 minutes) coronary artery disease prevention counseling today. She is 27 y.o. female and has risk factors for heart disease including prediabetes and obesity. We discussed intensive lifestyle modifications today with an emphasis on specific weight loss instructions and strategies. Pt was also informed of the importance of increasing exercise and decreasing saturated fats to help prevent heart disease.  Obesity Brandy Saunders is currently in the action stage of change. As such, her goal is to continue with weight loss efforts. She has agreed to follow a lower carbohydrate, vegetable and lean protein rich diet plan. We discussed Cognitive Behavioral Therapy strategies. She was offered an appointment with Dr. Mallie Mussel and she agreed to consider and let us know. Brandy Saunders has been instructed to walk as tolerated and work up to a goal of 150 minutes of combined cardio and strengthening exercise per week for weight loss and overall health benefits. We discussed the following Behavioral Modification Strategies today: increasing lean protein intake, increasing vegetables, increased H20 intake, ways to avoid boredom  eating, ways to avoid nighttime snacking, and emotional eating strategies. Brandy Saunders  has agreed to follow up with our clinic in 2 weeks. She was informed of the importance of frequent follow up visits to maximize her success with intensive lifestyle modifications for her multiple health conditions.   OBESITY BEHAVIORAL INTERVENTION VISIT  Today's visit was # 11  Starting weight: 447 lbs Starting date: 10/11/17 Today's weight : Weight: (!) 412 lb (186.9 kg)  Today's date: 05/02/2018 Total lbs lost to date: 35 At least 15 minutes were spent on discussing the following behavioral intervention visit.   ASK: We discussed the diagnosis of obesity with Brandy Saunders today and Brandy Saunders agreed to give [place] permission to discuss obesity behavioral modification therapy today.  ASSESS: Brandy Saunders has the diagnosis of obesity and her BMI today is 66.53. Brandy Saunders is in the action stage of change.   ADVISE: Brandy Saunders was educated on the multiple health risks of obesity as well as the benefit of weight loss to improve her health. She was advised of the need for long term treatment and the importance of lifestyle modifications to improve her current health and to decrease her risk of future health problems.  AGREE: Multiple dietary modification options and treatment options were discussed and  Brandy Saunders agreed to follow the recommendations documented in the above note.  ARRANGE: Brandy Saunders was educated on the importance of frequent visits to treat obesity as outlined per CMS and USPSTF guidelines and agreed to schedule her next follow up appointment today.  I, Marcille Blanco, am acting as Location manager for [employer]. Owens Shark, MD  I have reviewed the above documentation for accuracy and completeness, and I agree with the above. -Jearld Lesch, DO

## 2018-05-03 LAB — COMPREHENSIVE METABOLIC PANEL
A/G RATIO: 1.7 (ref 1.2–2.2)
ALT: 22 IU/L (ref 0–32)
AST: 18 IU/L (ref 0–40)
Albumin: 4.4 g/dL (ref 3.5–5.5)
Alkaline Phosphatase: 55 IU/L (ref 39–117)
BUN/Creatinine Ratio: 25 — ABNORMAL HIGH (ref 9–23)
BUN: 19 mg/dL (ref 6–20)
Bilirubin Total: 0.2 mg/dL (ref 0.0–1.2)
CALCIUM: 9 mg/dL (ref 8.7–10.2)
CO2: 21 mmol/L (ref 20–29)
CREATININE: 0.76 mg/dL (ref 0.57–1.00)
Chloride: 105 mmol/L (ref 96–106)
GFR calc Af Amer: 123 mL/min/{1.73_m2} (ref >59)
GFR, EST NON AFRICAN AMERICAN: 107 mL/min/{1.73_m2} (ref >59)
Globulin, Total: 2.6 g/dL (ref 1.5–4.5)
Glucose: 99 mg/dL (ref 65–99)
Potassium: 5 mmol/L (ref 3.5–5.2)
SODIUM: 141 mmol/L (ref 134–144)
TOTAL PROTEIN: 7 g/dL (ref 6.0–8.5)

## 2018-05-03 LAB — VITAMIN D 25 HYDROXY (VIT D DEFICIENCY, FRACTURES): VIT D 25 HYDROXY: 41.3 ng/mL (ref 30.0–100.0)

## 2018-05-03 LAB — HEMOGLOBIN A1C
ESTIMATED AVERAGE GLUCOSE: 120 mg/dL
Hgb A1c MFr Bld: 5.8 % — ABNORMAL HIGH (ref 4.8–5.6)

## 2018-05-03 LAB — INSULIN, RANDOM: INSULIN: 30.3 u[IU]/mL — ABNORMAL HIGH (ref 2.6–24.9)

## 2018-05-05 NOTE — Progress Notes (Signed)
Subjective:   Patient ID: Brandy Saunders, female   DOB: 27 y.o.   MRN: [id number]   HPI Patient presents after being seen in a long time ago with significant pain in her right ankle and in the right lateral foot.  She is extremely obese which is complicating factor and states that she would like to get more active but she cannot because of foot pain.  Also complains of ingrown toenail deformity   ROS      Objective:  Physical Exam  Neurovascular status was intact with patient found to have severe obesity and is noted to have quite a bit of discomfort in the sinus tarsi right and into the lateral aspect of the foot with inflammation fluid buildup noted.  Patient's found to have incurvated left hallux nail that is mildly tender and localized in its nature     Assessment:  Capsulitis of the sinus tarsi right with lateral tendinitis and ingrown toenail deformity left     Plan:  H&P conditions reviewed and today I injected the sinus tarsi right 3 mg Kenalog 5 mg Xylocaine injected the lateral tendon complex 3 mg Kenalog 5 mg Xylocaine and discussed ingrown toenail did not recommend treatment but it may require ultimate treatment.  Patient will be seen back and may require other treatment depending on response but again her obesity certainly is a complicating factor  X-ray indicates there is moderate collapse of the arch secondary to obesity with mild arthritic processes but not significant currently

## 2018-05-06 ENCOUNTER — Telehealth: Payer: Self-pay

## 2018-05-06 ENCOUNTER — Ambulatory Visit: Payer: Self-pay

## 2018-05-06 NOTE — Telephone Encounter (Signed)
Follow up call made to patient to see if she wanted to come in to office today or go to ED for evaluation no answer and I could not leave message on answering machine,

## 2018-05-06 NOTE — Telephone Encounter (Signed)
Incoming call from patient complaining of "heart rate doing some strange things". States per her Apple watch. Her pulse was 80, 91, 79, 69, 79.  Patient states she had an steroid injection into both feet on Thursday. Related to foot pain. Wonders if that is related.  Scheduled appointment for Tuesday.  05/07/18 with Dr. Etter Sjogren -[facility]. At 3:00pm.  Patient voiced understanding.  Related to patient if symptoms worsen to call us back or go to [facility] Department.   Telephone call to office.  Discussed the option if patient could be worked in sooner.   Office said they would reach out to Patient.       Reason for Disposition . [1] Skipped or extra beat(s) AND [2] occurs 4 or more times per minute  Answer Assessment - Initial Assessment Questions 1. DESCRIPTION: "Please describe your heart rate or heart beat that you are having" (e.g., fast/slow, regular/irregular, skipped or extra beats, "palpitations")     note 2. ONSET: "When did it start?" (Minutes, hours or days)      Friday nights  3. DURATION: "How long does it last" (e.g., seconds, minutes, hours)     30 sec comes and goes  Was resting .  Resting. 4. PATTERN "Does it come and go, or has it been constant since it started?"  "Does it get worse with exertion?"   "Are you feeling it now?"     Comes and goes 5. TAP: "Using your hand, can you tap out what you are feeling on a chair or table in front of you, so that I can hear?" (Note: not all patients can do this)       *No Answer* 6. HEART RATE: "Can you tell me your heart rate?" "How many beats in 15 seconds?"  (Note: not all patients can do this)       notes 7. RECURRENT SYMPTOM: "Have you ever had this before?" If so, ask: "When was the last time?" and "What happened that time?"      Friday 8. CAUSE: "What do you think is causing the palpitations?"     steriod 9. CARDIAC HISTORY: "Do you have any history of heart disease?" (e.g., heart attack, angina, bypass surgery, angioplasty, arrhythmia)     fingur nubness. High blood pressure 10. OTHER SYMPTOMS: "Do you have any other symptoms?" (e.g., dizziness, chest pain, sweating, difficulty breathing)       None, want to catch breath 11. PREGNANCY: "Is there any chance you are pregnant?" "When was your last menstrual period?"       Ended on Thursday high blood pressuer  Protocols used: Coin

## 2018-05-07 ENCOUNTER — Ambulatory Visit: Payer: BLUE CROSS/BLUE SHIELD | Admitting: Family Medicine

## 2018-05-07 VITALS — BP 130/81 | HR 81 | Temp 98.7°F | Resp 16 | Ht 66.0 in | Wt >= 6400 oz

## 2018-05-07 DIAGNOSIS — R002 Palpitations: Secondary | ICD-10-CM | POA: Diagnosis not present

## 2018-05-07 NOTE — Progress Notes (Signed)
Patient ID: Brandy Saunders, female    DOB: 1990/09/07  Age: 27 y.o. MRN: [id number]    Subjective:  Subjective  HPI Brandy Saunders presents for palpitations x a few days.    Review of Systems  Constitutional: Negative for activity change, appetite change, fatigue and unexpected weight change.  Respiratory: Negative for cough and shortness of breath.   Cardiovascular: Positive for palpitations. Negative for chest pain.  Psychiatric/Behavioral: Negative for behavioral problems and dysphoric mood. The patient is not nervous/anxious.     History Past Medical History:  Diagnosis Date  . Anxiety   . Asthma   . Back pain   . Binge eating   . Carpal tunnel syndrome, bilateral   . Chest pain   . Common migraine with intractable migraine 02/02/2017  . Constipation   . Depression   . Dyspnea   . Fatty liver   . GERD (gastroesophageal reflux disease)   . Hypertension   . Joint pain   . Leg edema   . Morbidly obese ([place])   . Palpitations   . PCOS (polycystic ovarian syndrome)   . Prediabetes   . Vitamin D deficiency     She has a past surgical history that includes Tonsilectomy, adenoidectomy, bilateral myringotomy and tubes (1994).   Her family history includes Alcoholism in her father; Diabetes in her maternal uncle; Drug abuse in her father; Heart disease in her maternal grandmother; Hyperlipidemia in her mother; Hypertension in her mother; Obesity in her father and mother; Stroke in her father.She reports that she has quit smoking. Her smoking use included cigarettes. She has never used smokeless tobacco. She reports that she drinks about 3.0 standard drinks of alcohol per week. She reports that she does not use drugs.  Current Outpatient Medications on File Prior to Visit  Medication Sig Dispense Refill  . acetaminophen (TYLENOL) 500 MG tablet Take 500 mg by mouth every 6 (six) hours as needed.    . Ascorbic Acid (VITAMIN C) 1000 MG tablet Take 1,000 mg by mouth daily.      Marland Kitchen BIOTIN 5000 PO Take 10,000 mg by mouth daily.     . Cyanocobalamin (VITAMIN B 12 PO) Take 2,500 mcg by mouth daily.    . metFORMIN (GLUCOPHAGE) 500 MG tablet Take 1 tablet (500 mg total) by mouth daily with breakfast. 90 tablet 0  . metoprolol succinate (TOPROL XL) 50 MG 24 hr tablet Take 2 tab po qd  with or immediately following a meal. 180 tablet 2  . Multiple Vitamins-Calcium (ONE-A-DAY WOMENS PO) Take 1 tablet by mouth daily.     . norethindrone-ethinyl estradiol (BLISOVI FE 1/20) 1-20 MG-MCG tablet Take 1 tablet by mouth daily. 84 tablet 3  . Oil Base OIL 1 Dose by Does not apply route at bedtime as needed. CBD oil per pt.    . SUMAtriptan (IMITREX) 100 MG tablet Take 1 tablet (100 mg total) by mouth 2 (two) times daily as needed for migraine. 60 tablet 0  . Vitamin D, Ergocalciferol, (DRISDOL) 50000 units CAPS capsule Take 1 capsule (50,000 Units total) by mouth every 7 (seven) days. 4 capsule 0   No current facility-administered medications on file prior to visit.      Objective:  Objective  Physical Exam  Constitutional: She is oriented to person, place, and time. She appears well-developed and well-nourished.  HENT:  Head: Normocephalic and atraumatic.  Eyes: Conjunctivae and EOM are normal.  Neck: Normal range of motion. Neck supple. No JVD present.  Carotid bruit is not present. No thyromegaly present.  Cardiovascular: Normal rate, regular rhythm and normal heart sounds.  No murmur heard. Pulmonary/Chest: Effort normal and breath sounds normal. No respiratory distress. She has no wheezes. She has no rales. She exhibits no tenderness.  Musculoskeletal: She exhibits no edema.  Neurological: She is alert and oriented to person, place, and time.  Psychiatric: She has a normal mood and affect.  Nursing note and vitals reviewed.  ekg--nsr  BP 130/81   Pulse 81   Temp 98.7 F (37.1 C) (Oral)   Resp 16   Ht 5\' 6"  (1.676 m)   Wt (!) 416 lb 6.4 oz (188.9 kg)   LMP  04/30/2018   SpO2 100%   BMI 67.21 kg/m  Wt Readings from Last 3 Encounters:  05/07/18 (!) 416 lb 6.4 oz (188.9 kg)  05/02/18 (!) 412 lb (186.9 kg)  05/02/18 (!) 412 lb (186.9 kg)     Lab Results  Component Value Date   WBC 4.7 10/11/2017   HGB 13.0 10/11/2017   HCT 39.9 10/11/2017   PLT 263.0 06/12/2017   GLUCOSE 99 05/02/2018   CHOL 143 10/11/2017   TRIG 78 10/11/2017   HDL 53 10/11/2017   LDLCALC 74 10/11/2017   ALT 22 05/02/2018   AST 18 05/02/2018   NA 141 05/02/2018   K 5.0 05/02/2018   CL 105 05/02/2018   CREATININE 0.76 05/02/2018   BUN 19 05/02/2018   CO2 21 05/02/2018   TSH 1.880 10/11/2017   HGBA1C 5.8 (H) 05/02/2018    Dg Ankle Complete Right  Result Date: 04/11/2018 CLINICAL DATA:  History of sprained ankle EXAM: RIGHT ANKLE - COMPLETE 3+ VIEW COMPARISON:  None. FINDINGS: Os trigonum. Moderate plantar calcaneal spur. No fracture or malalignment. Ankle mortise is symmetric. IMPRESSION: No acute osseous abnormality.  Moderate plantar calcaneal spur. Electronically Signed   By: Donavan Foil M.D.   On: 04/11/2018 17:48   Dg Foot Complete Right  Result Date: 04/11/2018 CLINICAL DATA:  Pain with weight-bearing EXAM: RIGHT FOOT COMPLETE - 3+ VIEW COMPARISON:  None. FINDINGS: No fracture or malalignment. Moderate plantar calcaneal spur and posterior enthesophyte. IMPRESSION: 1. No acute osseous abnormality 2. Moderate spurring of the plantar calcaneus Electronically Signed   By: Donavan Foil M.D.   On: 04/11/2018 17:47     Assessment & Plan:  Plan  I am having Brandy Saunders maintain her Multiple Vitamins-Calcium (ONE-A-DAY WOMENS PO), BIOTIN 5000 PO, vitamin C, acetaminophen, Cyanocobalamin (VITAMIN B 12 PO), metoprolol succinate, metFORMIN, norethindrone-ethinyl estradiol, SUMAtriptan, Vitamin D (Ergocalciferol), and Oil Base.  No orders of the defined types were placed in this encounter.   Problem List Items Addressed This Visit    None    Visit  Diagnoses    Palpitations    -  Primary   Relevant Orders   EKG 12-Lead (Completed)   CBC with Differential/Platelet   Comprehensive metabolic panel   Vitamin X09   TSH   T3, free   T4, free   Cardiac event monitor   ECHOCARDIOGRAM COMPLETE    check labs , echo , event monitor If symtpoms worsen or cp develops etc -- go to ER  Follow-up: Return if symptoms worsen or fail to improve.  Ann Held, DO

## 2018-05-07 NOTE — Telephone Encounter (Signed)
Will discuss at app

## 2018-05-07 NOTE — Addendum Note (Signed)
Addended by: Raynelle Dick R on: 05/07/2018 10:22 AM   Modules accepted: Orders

## 2018-05-07 NOTE — Patient Instructions (Signed)
Palpitations A palpitation is the feeling that your heartbeat is irregular or is faster than normal. It may feel like your heart is fluttering or skipping a beat. Palpitations are usually not a serious problem. They may be caused by many things, including smoking, caffeine, alcohol, stress, and certain medicines. Although most causes of palpitations are not serious, palpitations can be a sign of a serious medical problem. In some cases, you may need further medical evaluation. Follow these instructions at home: Pay attention to any changes in your symptoms. Take these actions to help with your condition:  Avoid the following: ? Caffeinated coffee, tea, soft drinks, diet pills, and energy drinks. ? Chocolate. ? Alcohol.  Do not use any tobacco products, such as cigarettes, chewing tobacco, and e-cigarettes. If you need help quitting, ask your health care provider.  Try to reduce your stress and anxiety. Things that can help you relax include: ? Yoga. ? Meditation. ? Physical activity, such as swimming, jogging, or walking. ? Biofeedback. This is a method that helps you learn to use your mind to control things in your body, such as your heartbeats.  Get plenty of rest and sleep.  Take over-the-counter and prescription medicines only as told by your health care provider.  Keep all follow-up visits as told by your health care provider. This is important.  Contact a health care provider if:  You continue to have a fast or irregular heartbeat after 24 hours.  Your palpitations occur more often. Get help right away if:  You have chest pain or shortness of breath.  You have a severe headache.  You feel dizzy or you faint. This information is not intended to replace advice given to you by your health care provider. Make sure you discuss any questions you have with your health care provider. Document Released: 08/11/2000 Document Revised: 01/17/2016 Document Reviewed: 04/29/2015 Elsevier  Interactive Patient Education  2018 [employer]  

## 2018-05-07 NOTE — Telephone Encounter (Signed)
Author phoned pt. re: reported palpitations. Pt. stated she "feels fine" right now, just woke up, has not eaten breakfast or taken her medications yet. Pt. recently received kenalog injections in feet on Thursday, and intermittent heart palpitations, "double tap" started Friday night into yesterday. Pt. stated she stopped taking meloxicam on Wednesday, because it made her "feel weird", with numbness in hand, but thought it could be her carpal tunnel causing that too. Pt. takes CBD tincture at night off/on X 6-7 months as well. Med list and familial history updated.  Pt. has been on low-carb keto X 1 month, with carb cycling as well. Author explained her symptoms could be from medication changes, diet changes, or something else, and encouraged pt. to eat breakfast, take her prescribed medication, but hold off on metoprolol this AM if her systolic BP was below 638 and her heart was not racing, as pt. explained her SBP has been lower recently since losing weight (106bpm per pt. report), and HR WNL, at least as of Thursday into yesterday. Author encouraged pt. to monitor BP/P and discuss her concerns with Dr. Etter Sjogren today at [facility] appointment; pt. verbalized understanding.

## 2018-05-08 ENCOUNTER — Encounter: Payer: Self-pay | Admitting: Family Medicine

## 2018-05-08 LAB — CBC WITH DIFFERENTIAL/PLATELET
Basophils Absolute: 0 10*3/uL (ref 0.0–0.1)
Basophils Relative: 0.6 % (ref 0.0–3.0)
EOS ABS: 0.1 10*3/uL (ref 0.0–0.7)
EOS PCT: 1 % (ref 0.0–5.0)
HCT: 41.2 % (ref 36.0–46.0)
HEMOGLOBIN: 13.9 g/dL (ref 12.0–15.0)
Lymphocytes Relative: 36.2 % (ref 12.0–46.0)
Lymphs Abs: 2.6 10*3/uL (ref 0.7–4.0)
MCHC: 33.8 g/dL (ref 30.0–36.0)
MCV: 83.5 fl (ref 78.0–100.0)
MONO ABS: 0.3 10*3/uL (ref 0.1–1.0)
Monocytes Relative: 3.9 % (ref 3.0–12.0)
Neutro Abs: 4.2 10*3/uL (ref 1.4–7.7)
Neutrophils Relative %: 58.3 % (ref 43.0–77.0)
Platelets: 272 10*3/uL (ref 150.0–400.0)
RBC: 4.93 Mil/uL (ref 3.87–5.11)
RDW: 14.2 % (ref 11.5–15.5)
WBC: 7.2 10*3/uL (ref 4.0–10.5)

## 2018-05-08 LAB — COMPREHENSIVE METABOLIC PANEL
ALK PHOS: 53 U/L (ref 39–117)
ALT: 19 U/L (ref 0–35)
AST: 13 U/L (ref 0–37)
Albumin: 4.1 g/dL (ref 3.5–5.2)
BILIRUBIN TOTAL: 0.4 mg/dL (ref 0.2–1.2)
BUN: 18 mg/dL (ref 6–23)
CO2: 28 mEq/L (ref 19–32)
CREATININE: 0.7 mg/dL (ref 0.40–1.20)
Calcium: 9.3 mg/dL (ref 8.4–10.5)
Chloride: 101 mEq/L (ref 96–112)
GFR: 128.05 mL/min (ref >60.00)
Glucose, Bld: 95 mg/dL (ref 70–99)
Potassium: 4.6 mEq/L (ref 3.5–5.1)
SODIUM: 137 meq/L (ref 135–145)
TOTAL PROTEIN: 7 g/dL (ref 6.0–8.3)

## 2018-05-08 LAB — T3, FREE: T3, Free: 3.1 pg/mL (ref 2.3–4.2)

## 2018-05-08 LAB — VITAMIN B12: VITAMIN B 12: 1259 pg/mL — AB (ref 211–911)

## 2018-05-08 LAB — T4, FREE: Free T4: 1.07 ng/dL (ref 0.60–1.60)

## 2018-05-08 LAB — TSH: TSH: 2.18 u[IU]/mL (ref 0.35–4.50)

## 2018-05-09 ENCOUNTER — Other Ambulatory Visit: Payer: Self-pay | Admitting: Family Medicine

## 2018-05-09 DIAGNOSIS — R002 Palpitations: Secondary | ICD-10-CM

## 2018-05-13 ENCOUNTER — Telehealth: Payer: Self-pay | Admitting: Family Medicine

## 2018-05-13 NOTE — Telephone Encounter (Signed)
Copied from [place]. Topic: Quick Communication - See Telephone Encounter >> May 13, 2018  4:35 PM Bea Graff, NT wrote: CRM for notification. See Telephone encounter for: 05/13/18. Pt states she was seen last week for heart palpations and she states she thinks its due from the new perfume she had started wearing. She states she did not wear the perfume today and she feels much better. She would like to know if the new perfume can cause these issues.

## 2018-05-13 NOTE — Telephone Encounter (Signed)
If she is allergic to it it is possible She should stay off of it and see if the palp come back

## 2018-05-14 NOTE — Telephone Encounter (Signed)
Patient notified

## 2018-05-16 ENCOUNTER — Ambulatory Visit (INDEPENDENT_AMBULATORY_CARE_PROVIDER_SITE_OTHER): Payer: BLUE CROSS/BLUE SHIELD | Admitting: Family Medicine

## 2018-05-16 VITALS — BP 132/80 | HR 82 | Temp 98.6°F | Ht 66.0 in | Wt >= 6400 oz

## 2018-05-16 DIAGNOSIS — E559 Vitamin D deficiency, unspecified: Secondary | ICD-10-CM | POA: Diagnosis not present

## 2018-05-16 DIAGNOSIS — Z6841 Body Mass Index (BMI) 40.0 and over, adult: Secondary | ICD-10-CM

## 2018-05-16 DIAGNOSIS — Z9189 Other specified personal risk factors, not elsewhere classified: Secondary | ICD-10-CM | POA: Diagnosis not present

## 2018-05-16 DIAGNOSIS — R002 Palpitations: Secondary | ICD-10-CM

## 2018-05-16 MED ORDER — VITAMIN D (ERGOCALCIFEROL) 1.25 MG (50000 UNIT) PO CAPS: 50000.0000 [IU] | ORAL_CAPSULE | ORAL | 0 refills | Status: DC

## 2018-05-20 NOTE — Progress Notes (Signed)
Office: [phone]  /  Fax: [phone]   HPI:   Chief Complaint: OBESITY Brandy Saunders is here to discuss her progress with her obesity treatment plan. She is on the lower carbohydrate, vegetable and lean protein rich diet plan and is following her eating plan approximately 85 % of the time. She states she is doing squat challenge 2 to 3 times per week. Brandy Saunders experienced three days of palpitations after her last visit. No arrhythmia's have been noted since her last visit with her PCP. Her weight is (!) 410 lb (186 kg) today and has had a weight loss of 2 pounds over a period of 2 weeks since her last visit. She has lost 37 lbs since starting treatment with [place].  Palpitations EkG was done at Dr. Carollee Herter (PCP) and showed normal sinus rhythm. Per patient, a holter monitor and echocardiogram was ordered.  Vitamin D deficiency Brandy Saunders has a diagnosis of vitamin D deficiency. Brandy Saunders is currently taking vit D and she admits to fatigue, but she denies nausea, vomiting or muscle weakness.  At risk for osteopenia and osteoporosis Brandy Saunders is at higher risk of osteopenia and osteoporosis due to vitamin D deficiency.   ALLERGIES: Allergies  Allergen Reactions  . Ibuprofen Diarrhea  . Meloxicam     Pt stated, "It made my emotions out of wack; it made my left hand numb"  . Metformin And Related Diarrhea    MEDICATIONS: Current Outpatient Medications on File Prior to Visit  Medication Sig Dispense Refill  . acetaminophen (TYLENOL) 500 MG tablet Take 500 mg by mouth every 6 (six) hours as needed.    . Ascorbic Acid (VITAMIN C) 1000 MG tablet Take 1,000 mg by mouth daily.    Marland Kitchen BIOTIN 5000 PO Take 10,000 mg by mouth daily.     . Cyanocobalamin (VITAMIN B 12 PO) Take 2,500 mcg by mouth daily.    . metFORMIN (GLUCOPHAGE) 500 MG tablet Take 1 tablet (500 mg total) by mouth daily with breakfast. 90 tablet 0  . metoprolol succinate (TOPROL XL) 50 MG 24 hr tablet Take 2 tab po qd  with or  immediately following a meal. 180 tablet 2  . Multiple Vitamins-Calcium (ONE-A-DAY WOMENS PO) Take 1 tablet by mouth daily.     . norethindrone-ethinyl estradiol (BLISOVI FE 1/20) 1-20 MG-MCG tablet Take 1 tablet by mouth daily. 84 tablet 3  . Oil Base OIL 1 Dose by Does not apply route at bedtime as needed. CBD oil per pt.    . SUMAtriptan (IMITREX) 100 MG tablet Take 1 tablet (100 mg total) by mouth 2 (two) times daily as needed for migraine. 60 tablet 0   No current facility-administered medications on file prior to visit.     PAST MEDICAL HISTORY: Past Medical History:  Diagnosis Date  . Anxiety   . Asthma   . Back pain   . Binge eating   . Carpal tunnel syndrome, bilateral   . Chest pain   . Common migraine with intractable migraine 02/02/2017  . Constipation   . Depression   . Dyspnea   . Fatty liver   . GERD (gastroesophageal reflux disease)   . Hypertension   . Joint pain   . Leg edema   . Morbidly obese ([place])   . Palpitations   . PCOS (polycystic ovarian syndrome)   . Prediabetes   . Vitamin D deficiency     PAST SURGICAL HISTORY: Past Surgical History:  Procedure Laterality Date  . TONSILECTOMY, ADENOIDECTOMY, BILATERAL MYRINGOTOMY  AND TUBES  1994    SOCIAL HISTORY: Social History   Tobacco Use  . Smoking status: Former Smoker    Types: Cigarettes  . Smokeless tobacco: Never Used  Substance Use Topics  . Alcohol use: Yes    Alcohol/week: 3.0 standard drinks    Types: 3 Glasses of wine per week  . Drug use: No    Types: Marijuana    Comment: once every 6 months, taking CBD    FAMILY HISTORY: Family History  Problem Relation Age of Onset  . Hypertension Mother   . Hyperlipidemia Mother   . Obesity Mother   . Stroke Father   . Obesity Father   . Alcoholism Father   . Drug abuse Father   . Diabetes Maternal Uncle   . Heart disease Maternal Grandmother   . Colon cancer Neg Hx     ROS: Review of Systems  Constitutional: Positive for  malaise/fatigue and weight loss.  Cardiovascular: Positive for palpitations.  Gastrointestinal: Negative for nausea and vomiting.  Musculoskeletal:       Negative for muscle weakness    PHYSICAL EXAM: Blood pressure 132/80, pulse 82, temperature 98.6 F (37 C), temperature source Oral, height 5\' 6"  (1.676 m), weight (!) 410 lb (186 kg), last menstrual period 04/30/2018, SpO2 98 %. Body mass index is 66.18 kg/m. Physical Exam  Constitutional: She is oriented to person, place, and time. She appears well-developed and well-nourished.  Cardiovascular: Normal rate.  Pulmonary/Chest: Effort normal.  Musculoskeletal: Normal range of motion.  Neurological: She is oriented to person, place, and time.  Skin: Skin is warm and dry.  Psychiatric: She has a normal mood and affect.  Vitals reviewed.   RECENT LABS AND TESTS: BMET    Component Value Date/Time   NA 137 05/07/2018 1605   NA 141 05/02/2018 1114   K 4.6 05/07/2018 1605   CL 101 05/07/2018 1605   CO2 28 05/07/2018 1605   GLUCOSE 95 05/07/2018 1605   BUN 18 05/07/2018 1605   BUN 19 05/02/2018 1114   CREATININE 0.70 05/07/2018 1605   CREATININE 0.67 09/28/2014 1146   CALCIUM 9.3 05/07/2018 1605   GFRNONAA 107 05/02/2018 1114   GFRAA 123 05/02/2018 1114   Lab Results  Component Value Date   HGBA1C 5.8 (H) 05/02/2018   HGBA1C 5.7 (H) 01/17/2018   HGBA1C 6.0 (H) 10/11/2017   HGBA1C 6.1 09/13/2017   HGBA1C 5.9 06/12/2017   Lab Results  Component Value Date   INSULIN 30.3 (H) 05/02/2018   INSULIN 39.5 (H) 01/17/2018   INSULIN 73.6 (H) 10/11/2017   CBC    Component Value Date/Time   WBC 7.2 05/07/2018 1605   RBC 4.93 05/07/2018 1605   HGB 13.9 05/07/2018 1605   HGB 13.0 10/11/2017 1022   HCT 41.2 05/07/2018 1605   HCT 39.9 10/11/2017 1022   PLT 272.0 05/07/2018 1605   MCV 83.5 05/07/2018 1605   MCV 83 10/11/2017 1022   MCH 27.1 10/11/2017 1022   MCH 24.5 (L) 09/28/2014 1146   MCHC 33.8 05/07/2018 1605   RDW  14.2 05/07/2018 1605   RDW 13.8 10/11/2017 1022   LYMPHSABS 2.6 05/07/2018 1605   LYMPHSABS 1.7 10/11/2017 1022   MONOABS 0.3 05/07/2018 1605   EOSABS 0.1 05/07/2018 1605   EOSABS 0.1 10/11/2017 1022   BASOSABS 0.0 05/07/2018 1605   BASOSABS 0.0 10/11/2017 1022   Iron/TIBC/Ferritin/ %Sat No results found for: IRON, TIBC, FERRITIN, IRONPCTSAT Lipid Panel     Component Value Date/Time  CHOL 143 10/11/2017 1022   TRIG 78 10/11/2017 1022   HDL 53 10/11/2017 1022   CHOLHDL 3 09/13/2017 0929   VLDL 16.4 09/13/2017 0929   LDLCALC 74 10/11/2017 1022   Hepatic Function Panel     Component Value Date/Time   PROT 7.0 05/07/2018 1605   PROT 7.0 05/02/2018 1114   ALBUMIN 4.1 05/07/2018 1605   ALBUMIN 4.4 05/02/2018 1114   AST 13 05/07/2018 1605   ALT 19 05/07/2018 1605   ALKPHOS 53 05/07/2018 1605   BILITOT 0.4 05/07/2018 1605   BILITOT 0.2 05/02/2018 1114   BILIDIR 0.1 09/28/2014 1146   IBILI 0.4 09/28/2014 1146      Component Value Date/Time   TSH 2.18 05/07/2018 1605   TSH 1.880 10/11/2017 1022   TSH 1.98 06/12/2017 1344   Results for MAKANA, FEIGEL (MRN [id number]) as of 05/20/2018 11:31  Ref. Range 05/02/2018 11:14  Vitamin D, 25-Hydroxy Latest Ref Range: 30.0 - 100.0 ng/mL 41.3   ASSESSMENT AND PLAN: Vitamin D deficiency - Plan: Vitamin D, Ergocalciferol, (DRISDOL) 50000 units CAPS capsule  Palpitations  At risk for osteoporosis  Class 3 severe obesity with serious comorbidity and body mass index (BMI) of 60.0 to 69.9 in adult, unspecified obesity type Brandy Saunders)  PLAN:  Palpitations Brandy Saunders will follow up with the above plan and she will follow up with our clinic in 2 weeks.  Vitamin D Deficiency Brandy Saunders was informed that low vitamin D levels contributes to fatigue and are associated with obesity, breast, and colon cancer. She agrees to continue to take prescription Vit D @50 ,000 IU every week #4 with no refills and will follow up for routine testing of vitamin  D, at least 2-3 times per year. She was informed of the risk of over-replacement of vitamin D and agrees to not increase her dose unless she discusses this with [place] first. Brandy Saunders agrees to follow up as directed.  At risk for osteopenia and osteoporosis Brandy Saunders was given extended  (15 minutes) osteoporosis prevention counseling today. Brandy Saunders is at risk for osteopenia and osteoporosis due to her vitamin D deficiency. She was encouraged to take her vitamin D and follow her higher calcium diet and increase strengthening exercise to help strengthen her bones and decrease her risk of osteopenia and osteoporosis.  Obesity Brandy Saunders is currently in the action stage of change. As such, her goal is to continue with weight loss efforts She has agreed to follow a lower carbohydrate, vegetable and lean protein rich diet plan Brandy Saunders is to start exercising again for weight loss and overall health benefits. We discussed the following Behavioral Modification Strategies today: planning for success, increasing lean protein intake, increasing vegetables and work on meal planning and easy cooking plans  Brandy Saunders has agreed to follow up with our clinic in 2 weeks. She was informed of the importance of frequent follow up visits to maximize her success with intensive lifestyle modifications for her multiple health conditions.   OBESITY BEHAVIORAL INTERVENTION VISIT  Today's visit was # 12   Starting weight: 447 lbs Starting date: 10/11/17 Today's weight : 410 lbs  Today's date: 05/16/2018 Total lbs lost to date: 11   ASK: We discussed the diagnosis of obesity with Brandy Saunders today and Brandy Saunders agreed to give [place] permission to discuss obesity behavioral modification therapy today.  ASSESS: Brandy Saunders has the diagnosis of obesity and her BMI today is 66.21 Brandy Saunders is in the action stage of change   ADVISE: Brandy Saunders was educated on the multiple  health risks of obesity as well as the benefit of  weight loss to improve her health. She was advised of the need for long term treatment and the importance of lifestyle modifications to improve her current health and to decrease her risk of future health problems.  AGREE: Multiple dietary modification options and treatment options were discussed and  Brandy Saunders agreed to follow the recommendations documented in the above note.  ARRANGE: Brandy Saunders was educated on the importance of frequent visits to treat obesity as outlined per CMS and USPSTF guidelines and agreed to schedule her next follow up appointment today.  I, Doreene Nest, am acting as transcriptionist for Eber Jones, MD  I have reviewed the above documentation for accuracy and completeness, and I agree with the above. - Ilene Qua, MD

## 2018-05-21 ENCOUNTER — Other Ambulatory Visit (HOSPITAL_BASED_OUTPATIENT_CLINIC_OR_DEPARTMENT_OTHER): Payer: Self-pay

## 2018-05-22 ENCOUNTER — Ambulatory Visit (HOSPITAL_COMMUNITY)
Admission: RE | Admit: 2018-05-22 | Discharge: 2018-05-22 | Disposition: A | Discharge status: Home / Self Care | Payer: BLUE CROSS/BLUE SHIELD | Source: Ambulatory Visit | Attending: Family Medicine | Admitting: Family Medicine

## 2018-05-22 DIAGNOSIS — R002 Palpitations: Secondary | ICD-10-CM | POA: Diagnosis not present

## 2018-05-22 DIAGNOSIS — I10 Essential (primary) hypertension: Secondary | ICD-10-CM | POA: Insufficient documentation

## 2018-05-22 NOTE — Progress Notes (Signed)
  Echocardiogram 2D Echocardiogram has been performed.  Brandy Saunders 05/22/2018, 11:20 AM

## 2018-05-23 ENCOUNTER — Encounter: Payer: Self-pay | Admitting: Family Medicine

## 2018-05-23 ENCOUNTER — Ambulatory Visit: Payer: BLUE CROSS/BLUE SHIELD

## 2018-05-23 DIAGNOSIS — R002 Palpitations: Secondary | ICD-10-CM

## 2018-05-29 ENCOUNTER — Ambulatory Visit (INDEPENDENT_AMBULATORY_CARE_PROVIDER_SITE_OTHER): Payer: BLUE CROSS/BLUE SHIELD | Admitting: Bariatrics

## 2018-06-05 ENCOUNTER — Ambulatory Visit: Payer: BLUE CROSS/BLUE SHIELD | Admitting: Podiatry

## 2018-06-06 ENCOUNTER — Ambulatory Visit (INDEPENDENT_AMBULATORY_CARE_PROVIDER_SITE_OTHER): Payer: BLUE CROSS/BLUE SHIELD | Admitting: Family Medicine

## 2018-06-06 VITALS — BP 137/63 | HR 81 | Temp 98.1°F | Ht 66.0 in | Wt >= 6400 oz

## 2018-06-06 DIAGNOSIS — E559 Vitamin D deficiency, unspecified: Secondary | ICD-10-CM | POA: Diagnosis not present

## 2018-06-06 DIAGNOSIS — R7303 Prediabetes: Secondary | ICD-10-CM

## 2018-06-06 DIAGNOSIS — F3289 Other specified depressive episodes: Secondary | ICD-10-CM | POA: Diagnosis not present

## 2018-06-06 DIAGNOSIS — Z6841 Body Mass Index (BMI) 40.0 and over, adult: Secondary | ICD-10-CM

## 2018-06-06 DIAGNOSIS — Z9189 Other specified personal risk factors, not elsewhere classified: Secondary | ICD-10-CM

## 2018-06-11 NOTE — Progress Notes (Signed)
Office: [phone]  /  Fax: [phone]   HPI:   Chief Complaint: OBESITY Brandy Saunders is here to discuss her progress with her obesity treatment plan. Brandy Saunders is on the lower carbohydrate, vegetable and lean protein rich diet plan and is following her eating plan approximately 50 % of the time. Brandy Saunders states Brandy Saunders is doing cardio and weight lifting for 30-40 minutes 2 times per week. Brandy Saunders has a lot of work stresses and is moving. Brandy Saunders is noticing Brandy Saunders is binging throughout the week with indulgent foods. Her weight is (!) 418 lb (189.6 kg) today and has gained 8 pounds since her last visit. Brandy Saunders has lost 29 lbs since starting treatment with [place].  Vitamin D Deficiency Brandy Saunders has a diagnosis of vitamin D deficiency. Brandy Saunders is currently taking prescription Vit D. Brandy Saunders notes fatigue and denies nausea, vomiting or muscle weakness.  Pre-Diabetes Brandy Saunders has a diagnosis of pre-diabetes based on her elevated Hgb A1c and was informed this puts her at greater risk of developing diabetes. Brandy Saunders notes carbohydrate cravings and Brandy Saunders is not taking her metformin. Brandy Saunders continues to work on diet and exercise to decrease risk of diabetes. Brandy Saunders denies nausea or hypoglycemia.  At risk for diabetes Brandy Saunders is at higher than average risk for developing diabetes due to her obesity and pre-diabetes. Brandy Saunders currently denies polyuria or polydipsia.  Depression with emotional eating behaviors Brandy Saunders notes binging behaviors and Brandy Saunders struggles with emotional eating and using food for comfort to the extent that it is negatively impacting her health. Brandy Saunders often snacks when Brandy Saunders is not hungry. Brandy Saunders sometimes feels Brandy Saunders is out of control and then feels guilty that Brandy Saunders made poor food choices. Brandy Saunders has been working on behavior modification techniques to help reduce her emotional eating and has been somewhat successful. Brandy Saunders shows no sign of suicidal or homicidal ideations.  Depression screen [facility] 2/9 10/11/2017 05/09/2017 10/29/2015 09/17/2014    Decreased Interest 3 0 0 1  Down, Depressed, Hopeless 3 0 1 0  PHQ - 2 Score 6 0 1 1  Altered sleeping 2 - - -  Tired, decreased energy 3 - - -  Change in appetite 2 - - -  Feeling bad or failure about yourself  3 - - -  Trouble concentrating 3 - - -  Moving slowly or fidgety/restless 2 - - -  Suicidal thoughts 2 - - -  PHQ-9 Score 23 - - -  Difficult doing work/chores Extremely dIfficult - - -    ALLERGIES: Allergies  Allergen Reactions  . Ibuprofen Diarrhea  . Meloxicam     Pt stated, "It made my emotions out of wack; it made my left hand numb"  . Metformin And Related Diarrhea    MEDICATIONS: Current Outpatient Medications on File Prior to Visit  Medication Sig Dispense Refill  . acetaminophen (TYLENOL) 500 MG tablet Take 500 mg by mouth every 6 (six) hours as needed.    . Ascorbic Acid (VITAMIN C) 1000 MG tablet Take 1,000 mg by mouth daily.    Marland Kitchen BIOTIN 5000 PO Take 10,000 mg by mouth daily.     . Cyanocobalamin (VITAMIN B 12 PO) Take 2,500 mcg by mouth daily.    . metFORMIN (GLUCOPHAGE) 500 MG tablet Take 1 tablet (500 mg total) by mouth daily with breakfast. 90 tablet 0  . metoprolol succinate (TOPROL XL) 50 MG 24 hr tablet Take 2 tab po qd  with or immediately following a meal. 180 tablet 2  . Multiple Vitamins-Calcium (ONE-A-DAY WOMENS  PO) Take 1 tablet by mouth daily.     . norethindrone-ethinyl estradiol (BLISOVI FE 1/20) 1-20 MG-MCG tablet Take 1 tablet by mouth daily. 84 tablet 3  . Oil Base OIL 1 Dose by Does not apply route at bedtime as needed. CBD oil per pt.    . SUMAtriptan (IMITREX) 100 MG tablet Take 1 tablet (100 mg total) by mouth 2 (two) times daily as needed for migraine. 60 tablet 0  . Vitamin D, Ergocalciferol, (DRISDOL) 50000 units CAPS capsule Take 1 capsule (50,000 Units total) by mouth every 7 (seven) days. 4 capsule 0   No current facility-administered medications on file prior to visit.     PAST MEDICAL HISTORY: Past Medical History:   Diagnosis Date  . Anxiety   . Asthma   . Back pain   . Binge eating   . Carpal tunnel syndrome, bilateral   . Chest pain   . Common migraine with intractable migraine 02/02/2017  . Constipation   . Depression   . Dyspnea   . Fatty liver   . GERD (gastroesophageal reflux disease)   . Hypertension   . Joint pain   . Leg edema   . Morbidly obese ([place])   . Palpitations   . PCOS (polycystic ovarian syndrome)   . Prediabetes   . Vitamin D deficiency     PAST SURGICAL HISTORY: Past Surgical History:  Procedure Laterality Date  . TONSILECTOMY, ADENOIDECTOMY, BILATERAL MYRINGOTOMY AND TUBES  1994    SOCIAL HISTORY: Social History   Tobacco Use  . Smoking status: Former Smoker    Types: Cigarettes  . Smokeless tobacco: Never Used  Substance Use Topics  . Alcohol use: Yes    Alcohol/week: 3.0 standard drinks    Types: 3 Glasses of wine per week  . Drug use: No    Types: Marijuana    Comment: once every 6 months, taking CBD    FAMILY HISTORY: Family History  Problem Relation Age of Onset  . Hypertension Mother   . Hyperlipidemia Mother   . Obesity Mother   . Stroke Father   . Obesity Father   . Alcoholism Father   . Drug abuse Father   . Diabetes Maternal Uncle   . Heart disease Maternal Grandmother   . Colon cancer Neg Hx     ROS: Review of Systems  Constitutional: Positive for malaise/fatigue. Negative for weight loss.  Gastrointestinal: Negative for nausea and vomiting.  Genitourinary: Negative for frequency.  Musculoskeletal:       Negative muscle weakness  Endo/Heme/Allergies: Negative for polydipsia.       Negative hypoglycemia  Psychiatric/Behavioral: Positive for depression. Negative for suicidal ideas.    PHYSICAL EXAM: Blood pressure 137/63, pulse 81, temperature 98.1 F (36.7 C), temperature source Oral, height 5\' 6"  (1.676 m), weight (!) 418 lb (189.6 kg), SpO2 99 %. Body mass index is 67.47 kg/m. Physical Exam  Constitutional: Brandy Saunders is  oriented to person, place, and time. Brandy Saunders appears well-developed and well-nourished.  Cardiovascular: Normal rate.  Pulmonary/Chest: Effort normal.  Musculoskeletal: Normal range of motion.  Neurological: Brandy Saunders is oriented to person, place, and time.  Skin: Skin is warm and dry.  Psychiatric: Brandy Saunders has a normal mood and affect. Her behavior is normal.  Vitals reviewed.   RECENT LABS AND TESTS: BMET    Component Value Date/Time   NA 137 05/07/2018 1605   NA 141 05/02/2018 1114   K 4.6 05/07/2018 1605   CL 101 05/07/2018 1605   CO2  28 05/07/2018 1605   GLUCOSE 95 05/07/2018 1605   BUN 18 05/07/2018 1605   BUN 19 05/02/2018 1114   CREATININE 0.70 05/07/2018 1605   CREATININE 0.67 09/28/2014 1146   CALCIUM 9.3 05/07/2018 1605   GFRNONAA 107 05/02/2018 1114   GFRAA 123 05/02/2018 1114   Lab Results  Component Value Date   HGBA1C 5.8 (H) 05/02/2018   HGBA1C 5.7 (H) 01/17/2018   HGBA1C 6.0 (H) 10/11/2017   HGBA1C 6.1 09/13/2017   HGBA1C 5.9 06/12/2017   Lab Results  Component Value Date   INSULIN 30.3 (H) 05/02/2018   INSULIN 39.5 (H) 01/17/2018   INSULIN 73.6 (H) 10/11/2017   CBC    Component Value Date/Time   WBC 7.2 05/07/2018 1605   RBC 4.93 05/07/2018 1605   HGB 13.9 05/07/2018 1605   HGB 13.0 10/11/2017 1022   HCT 41.2 05/07/2018 1605   HCT 39.9 10/11/2017 1022   PLT 272.0 05/07/2018 1605   MCV 83.5 05/07/2018 1605   MCV 83 10/11/2017 1022   MCH 27.1 10/11/2017 1022   MCH 24.5 (L) 09/28/2014 1146   MCHC 33.8 05/07/2018 1605   RDW 14.2 05/07/2018 1605   RDW 13.8 10/11/2017 1022   LYMPHSABS 2.6 05/07/2018 1605   LYMPHSABS 1.7 10/11/2017 1022   MONOABS 0.3 05/07/2018 1605   EOSABS 0.1 05/07/2018 1605   EOSABS 0.1 10/11/2017 1022   BASOSABS 0.0 05/07/2018 1605   BASOSABS 0.0 10/11/2017 1022   Iron/TIBC/Ferritin/ %Sat No results found for: IRON, TIBC, FERRITIN, IRONPCTSAT Lipid Panel     Component Value Date/Time   CHOL 143 10/11/2017 1022   TRIG 78  10/11/2017 1022   HDL 53 10/11/2017 1022   CHOLHDL 3 09/13/2017 0929   VLDL 16.4 09/13/2017 0929   LDLCALC 74 10/11/2017 1022   Hepatic Function Panel     Component Value Date/Time   PROT 7.0 05/07/2018 1605   PROT 7.0 05/02/2018 1114   ALBUMIN 4.1 05/07/2018 1605   ALBUMIN 4.4 05/02/2018 1114   AST 13 05/07/2018 1605   ALT 19 05/07/2018 1605   ALKPHOS 53 05/07/2018 1605   BILITOT 0.4 05/07/2018 1605   BILITOT 0.2 05/02/2018 1114   BILIDIR 0.1 09/28/2014 1146   IBILI 0.4 09/28/2014 1146      Component Value Date/Time   TSH 2.18 05/07/2018 1605   TSH 1.880 10/11/2017 1022   TSH 1.98 06/12/2017 1344  Results for JENESIS, MARTIN (MRN [id number]) as of 06/11/2018 12:49  Ref. Range 05/02/2018 11:14  Vitamin D, 25-Hydroxy Latest Ref Range: 30.0 - 100.0 ng/mL 41.3    ASSESSMENT AND PLAN: Vitamin D deficiency  Prediabetes  Other depression  At risk for diabetes mellitus  Class 3 severe obesity with serious comorbidity and body mass index (BMI) of 60.0 to 69.9 in adult, unspecified obesity type ([place])  PLAN:  Vitamin D Deficiency Brandy Saunders was informed that low vitamin D levels contributes to fatigue and are associated with obesity, breast, and colon cancer. Petrita agrees to continue taking prescription Vit D @50 ,000 IU every week and will follow up for routine testing of vitamin D, at least 2-3 times per year. Brandy Saunders was informed of the risk of over-replacement of vitamin D and agrees to not increase her dose unless Brandy Saunders discusses this with [place] first. Brandy Saunders agrees to follow up with our clinic in 2 weeks.  Pre-Diabetes Brandy Saunders will continue to work on weight loss, exercise, and decreasing simple carbohydrates in her diet to help decrease the risk of diabetes. We dicussed  metformin including benefits and risks. Brandy Saunders was informed that eating too many simple carbohydrates or too many calories at one sitting increases the likelihood of GI side effects. Brandy Saunders agrees to start  metformin this weekend and we will follow up at next visit. Brandy Saunders agrees to follow up with our clinic in 2 weeks as directed to monitor her progress.  Diabetes risk counselling Brandy Saunders was given extended (15 minutes) diabetes prevention counseling today. Brandy Saunders is 27 y.o. female and has risk factors for diabetes including obesity and pre-diabetes. We discussed intensive lifestyle modifications today with an emphasis on weight loss as well as increasing exercise and decreasing simple carbohydrates in her diet.  Depression with Emotional Eating Behaviors We discussed behavior modification techniques today to help Brandy Saunders deal with her emotional eating and depression. We will refer to Dr. Mallie Mussel, our bariatric psychologist for evaluation. Brandy Saunders agrees to follow up with our clinic in 2 weeks.  Obesity Brandy Saunders is currently in the action stage of change. As such, her goal is to continue with weight loss efforts Brandy Saunders has agreed to follow a lower carbohydrate, vegetable and lean protein rich diet plan Brandy Saunders has been instructed to work up to a goal of 150 minutes of combined cardio and strengthening exercise per week for weight loss and overall health benefits. We discussed the following Behavioral Modification Strategies today: increasing lean protein intake, increasing vegetables, work on meal planning and easy cooking plans, and planning for success   Brandy Saunders has agreed to follow up with our clinic in 2 weeks. Brandy Saunders was informed of the importance of frequent follow up visits to maximize her success with intensive lifestyle modifications for her multiple health conditions.   OBESITY BEHAVIORAL INTERVENTION VISIT  Today's visit was # 13   Starting weight: 447 lbs Starting date: 10/11/17 Today's weight : 418 lbs  Today's date: 06/06/2018 Total lbs lost to date: 21    ASK: We discussed the diagnosis of obesity with Brandy Saunders today and Brandy Saunders agreed to give [place] permission to  discuss obesity behavioral modification therapy today.  ASSESS: Brandy Saunders has the diagnosis of obesity and her BMI today is 67.5 Brandy Saunders is in the action stage of change   ADVISE: Brandy Saunders was educated on the multiple health risks of obesity as well as the benefit of weight loss to improve her health. Brandy Saunders was advised of the need for long term treatment and the importance of lifestyle modifications to improve her current health and to decrease her risk of future health problems.  AGREE: Multiple dietary modification options and treatment options were discussed and  Kirandeep agreed to follow the recommendations documented in the above note.  ARRANGE: Karter was educated on the importance of frequent visits to treat obesity as outlined per CMS and USPSTF guidelines and agreed to schedule her next follow up appointment today.  I, Trixie Dredge, am acting as transcriptionist for Ilene Qua, MD  I have reviewed the above documentation for accuracy and completeness, and I agree with the above. - Ilene Qua, MD

## 2018-06-12 ENCOUNTER — Encounter: Payer: Self-pay | Admitting: Podiatry

## 2018-06-12 ENCOUNTER — Ambulatory Visit: Payer: BLUE CROSS/BLUE SHIELD | Admitting: Podiatry

## 2018-06-12 ENCOUNTER — Encounter

## 2018-06-12 DIAGNOSIS — M7672 Peroneal tendinitis, left leg: Secondary | ICD-10-CM | POA: Diagnosis not present

## 2018-06-12 DIAGNOSIS — M779 Enthesopathy, unspecified: Secondary | ICD-10-CM | POA: Diagnosis not present

## 2018-06-12 DIAGNOSIS — L600 Ingrowing nail: Secondary | ICD-10-CM

## 2018-06-12 NOTE — Progress Notes (Signed)
Subjective:   Patient ID: Brandy Saunders, female   DOB: 27 y.o.   MRN: [id number]   HPI Patient presents stating she still getting pain in her right foot.  We worked on doing better but pain in the top of the foot in the Achilles tendon area   ROS      Objective:  Physical Exam  Neurovascular status intact with discomfort dorsum right foot moderate in the posterior aspect right heel with extreme obesity is complicating factor     Assessment:  Probability for compensatory pain secondary to change in gait after having sinus tarsi injection     Plan:  Reviewed condition and discussed immobilization with boot which patient denies wanting currently.  At this point I do think physical therapy would be her best option I am referring her to physical therapy for this condition and she we will be seen back if not better and may have to consider MRI of the foot.  Also discussed ultimate removal of damaged hallux nail

## 2018-06-14 ENCOUNTER — Encounter: Payer: Self-pay | Admitting: Family Medicine

## 2018-06-14 ENCOUNTER — Ambulatory Visit (INDEPENDENT_AMBULATORY_CARE_PROVIDER_SITE_OTHER): Payer: BLUE CROSS/BLUE SHIELD | Admitting: Family Medicine

## 2018-06-14 VITALS — BP 126/86 | HR 87 | Temp 99.1°F | Resp 18 | Ht 66.0 in | Wt >= 6400 oz

## 2018-06-14 DIAGNOSIS — Z0000 Encounter for general adult medical examination without abnormal findings: Secondary | ICD-10-CM

## 2018-06-14 DIAGNOSIS — I10 Essential (primary) hypertension: Secondary | ICD-10-CM

## 2018-06-14 LAB — LIPID PANEL
CHOLESTEROL: 151 mg/dL (ref 0–200)
HDL: 55.6 mg/dL (ref >39.00)
LDL CALC: 81 mg/dL (ref 0–99)
NonHDL: 95
Total CHOL/HDL Ratio: 3
Triglycerides: 70 mg/dL (ref 0.0–149.0)
VLDL: 14 mg/dL (ref 0.0–40.0)

## 2018-06-14 NOTE — Progress Notes (Signed)
Subjective:     Brandy Saunders is a 27 y.o. female and is here for a comprehensive physical exam. The patient reports no problems.  Social History   Socioeconomic History  . Marital status: Single    Spouse name: n/a  . Number of children: 0  . Years of education: 54  . Highest education level: Not on file  Occupational History  . Occupation: Public relations account executive  Social Needs  . Financial resource strain: Not on file  . Food insecurity:    Worry: Not on file    Inability: Not on file  . Transportation needs:    Medical: Not on file    Non-medical: Not on file  Tobacco Use  . Smoking status: Former Smoker    Types: Cigarettes  . Smokeless tobacco: Never Used  Substance and Sexual Activity  . Alcohol use: Yes    Alcohol/week: 3.0 standard drinks    Types: 3 Glasses of wine per week  . Drug use: No    Types: Marijuana    Comment: once every 6 months, taking CBD  . Sexual activity: Yes    Partners: Male    Birth control/protection: Condom, Pill    Comment: inconsistent condom use  Lifestyle  . Physical activity:    Days per week: Not on file    Minutes per session: Not on file  . Stress: Not on file  Relationships  . Social connections:    Talks on phone: Not on file    Gets together: Not on file    Attends religious service: Not on file    Active member of club or organization: Not on file    Attends meetings of clubs or organizations: Not on file    Relationship status: Not on file  . Intimate partner violence:    Fear of current or ex partner: Not on file    Emotionally abused: Not on file    Physically abused: Not on file    Forced sexual activity: Not on file  Other Topics Concern  . Not on file  Social History Narrative   Lives with a roommate.     Parents live in [place], [place].   Right handed   Caffeine use: coffee weekly   Health Maintenance  Topic Date Due  . INFLUENZA VACCINE  03/29/2019 (Originally 03/28/2018)  . TETANUS/TDAP  07/30/2018  .  PAP SMEAR  04/12/2019  . HIV Screening  Completed    The following portions of the patient's history were reviewed and updated as appropriate:  She  has a past medical history of Anxiety, Asthma, Back pain, Binge eating, Carpal tunnel syndrome, bilateral, Chest pain, Common migraine with intractable migraine (02/02/2017), Constipation, Depression, Dyspnea, Fatty liver, GERD (gastroesophageal reflux disease), Hypertension, Joint pain, Leg edema, Morbidly obese ([place]), Palpitations, PCOS (polycystic ovarian syndrome), Prediabetes, and Vitamin D deficiency. She does not have any pertinent problems on file. She  has a past surgical history that includes Tonsilectomy, adenoidectomy, bilateral myringotomy and tubes (1994). Her family history includes Alcoholism in her father; Diabetes in her maternal uncle; Drug abuse in her father; Heart disease in her maternal grandmother; Hyperlipidemia in her mother; Hypertension in her mother; Obesity in her father and mother; Stroke in her father. She  reports that she has quit smoking. Her smoking use included cigarettes. She has never used smokeless tobacco. She reports that she drinks about 3.0 standard drinks of alcohol per week. She reports that she does not use drugs. She has a current  medication list which includes the following prescription(s): acetaminophen, vitamin c, biotin, cyanocobalamin, metformin, metoprolol succinate, multiple vitamins-calcium, norethindrone-ethinyl estradiol, oil base, sumatriptan, and vitamin d (ergocalciferol). Current Outpatient Medications on File Prior to Visit  Medication Sig Dispense Refill  . acetaminophen (TYLENOL) 500 MG tablet Take 500 mg by mouth every 6 (six) hours as needed.    . Ascorbic Acid (VITAMIN C) 1000 MG tablet Take 1,000 mg by mouth daily.    Marland Kitchen BIOTIN 5000 PO Take 10,000 mg by mouth daily.     . Cyanocobalamin (VITAMIN B 12 PO) Take 2,500 mcg by mouth daily.    . metFORMIN (GLUCOPHAGE) 500 MG tablet Take 1  tablet (500 mg total) by mouth daily with breakfast. 90 tablet 0  . metoprolol succinate (TOPROL XL) 50 MG 24 hr tablet Take 2 tab po qd  with or immediately following a meal. 180 tablet 2  . Multiple Vitamins-Calcium (ONE-A-DAY WOMENS PO) Take 1 tablet by mouth daily.     . norethindrone-ethinyl estradiol (BLISOVI FE 1/20) 1-20 MG-MCG tablet Take 1 tablet by mouth daily. 84 tablet 3  . Oil Base OIL 1 Dose by Does not apply route at bedtime as needed. CBD oil per pt.    . SUMAtriptan (IMITREX) 100 MG tablet Take 1 tablet (100 mg total) by mouth 2 (two) times daily as needed for migraine. 60 tablet 0  . Vitamin D, Ergocalciferol, (DRISDOL) 50000 units CAPS capsule Take 1 capsule (50,000 Units total) by mouth every 7 (seven) days. 4 capsule 0   No current facility-administered medications on file prior to visit.    She is allergic to ibuprofen; meloxicam; and metformin and related..  Review of Systems Review of Systems  Constitutional: Negative for activity change, appetite change and fatigue.  HENT: Negative for hearing loss, congestion, tinnitus and ear discharge.  dentist q86m Eyes: Negative for visual disturbance (see optho q1y -- vision corrected to 20/20 with glasses).  Respiratory: Negative for cough, chest tightness and shortness of breath.   Cardiovascular: Negative for chest pain, palpitations and leg swelling.  Gastrointestinal: Negative for abdominal pain, diarrhea, constipation and abdominal distention.  Genitourinary: Negative for urgency, frequency, decreased urine volume and difficulty urinating.  Musculoskeletal: Negative for back pain, arthralgias and gait problem.  Skin: Negative for color change, pallor and rash.  Neurological: Negative for dizziness, light-headedness, numbness and headaches.  Hematological: Negative for adenopathy. Does not bruise/bleed easily.  Psychiatric/Behavioral: Negative for suicidal ideas, confusion, sleep disturbance, self-injury, dysphoric mood,  decreased concentration and agitation.       Objective:    BP 126/86 (BP Location: Right Arm, Cuff Size: Normal)   Pulse 87   Temp 99.1 F (37.3 C) (Oral)   Resp 18   Ht 5\' 6"  (1.676 m)   Wt (!) 428 lb 9.6 oz (194.4 kg)   LMP 05/31/2018   SpO2 100%   BMI 69.18 kg/m  General appearance: alert, cooperative, appears stated age and no distress Head: Normocephalic, without obvious abnormality, atraumatic Eyes: negative findings: lids and lashes normal, conjunctivae and sclerae normal and pupils equal, round, reactive to light and accomodation Ears: normal TM's and external ear canals both ears Nose: Nares normal. Septum midline. Mucosa normal. No drainage or sinus tenderness. Throat: lips, mucosa, and tongue normal; teeth and gums normal Neck: no adenopathy, no carotid bruit, no JVD, supple, symmetrical, trachea midline and thyroid not enlarged, symmetric, no tenderness/mass/nodules Back: symmetric, no curvature. ROM normal. No CVA tenderness. Lungs: clear to auscultation bilaterally Breasts: normal appearance, no  masses or tenderness Heart: regular rate and rhythm, S1, S2 normal, no murmur, click, rub or gallop Abdomen: soft, non-tender; bowel sounds normal; no masses,  no organomegaly and +morbid obesity Pelvic: deferred Extremities: extremities normal, atraumatic, no cyanosis or edema Pulses: 2+ and symmetric Skin: Skin color, texture, turgor normal. No rashes or lesions Lymph nodes: Cervical, supraclavicular, and axillary nodes normal. Neurologic: Alert and oriented X 3, normal strength and tone. Normal symmetric reflexes. Normal coordination and gait    Assessment:    Healthy female exam.      Plan:    ghm utd Check labs See After Visit Summary for Counseling Recommendations    1. Preventative health care See above  2. Morbid obesity ([place]) Per healthy weight and   3. Essential hypertension Well controlled, no changes to meds. Encouraged heart healthy diet such  as the DASH diet and exercise as tolerated.

## 2018-06-14 NOTE — Patient Instructions (Signed)
Preventive Care 18-39 Years, Female Preventive care refers to lifestyle choices and visits with your health care provider that can promote health and wellness. What does preventive care include?  A yearly physical exam. This is also called an annual well check.  Dental exams once or twice a year.  Routine eye exams. Ask your health care provider how often you should have your eyes checked.  Personal lifestyle choices, including: ? Daily care of your teeth and gums. ? Regular physical activity. ? Eating a healthy diet. ? Avoiding tobacco and drug use. ? Limiting alcohol use. ? Practicing safe sex. ? Taking vitamin and mineral supplements as recommended by your health care provider. What happens during an annual well check? The services and screenings done by your health care provider during your annual well check will depend on your age, overall health, lifestyle risk factors, and family history of disease. Counseling Your health care provider may ask you questions about your:  Alcohol use.  Tobacco use.  Drug use.  Emotional well-being.  Home and relationship well-being.  Sexual activity.  Eating habits.  Work and work Statistician.  Method of birth control.  Menstrual cycle.  Pregnancy history.  Screening You may have the following tests or measurements:  Height, weight, and BMI.  Diabetes screening. This is done by checking your blood sugar (glucose) after you have not eaten for a while (fasting).  Blood pressure.  Lipid and cholesterol levels. These may be checked every 5 years starting at age 66.  Skin check.  Hepatitis C blood test.  Hepatitis B blood test.  Sexually transmitted disease (STD) testing.  BRCA-related cancer screening. This may be done if you have a family history of breast, ovarian, tubal, or peritoneal cancers.  Pelvic exam and Pap test. This may be done every 3 years starting at age 40. Starting at age 59, this may be done every 5  years if you have a Pap test in combination with an HPV test.  Discuss your test results, treatment options, and if necessary, the need for more tests with your health care provider. Vaccines Your health care provider may recommend certain vaccines, such as:  Influenza vaccine. This is recommended every year.  Tetanus, diphtheria, and acellular pertussis (Tdap, Td) vaccine. You may need a Td booster every 10 years.  Varicella vaccine. You may need this if you have not been vaccinated.  HPV vaccine. If you are 69 or younger, you may need three doses over 6 months.  Measles, mumps, and rubella (MMR) vaccine. You may need at least one dose of MMR. You may also need a second dose.  Pneumococcal 13-valent conjugate (PCV13) vaccine. You may need this if you have certain conditions and were not previously vaccinated.  Pneumococcal polysaccharide (PPSV23) vaccine. You may need one or two doses if you smoke cigarettes or if you have certain conditions.  Meningococcal vaccine. One dose is recommended if you are age 27-21 years and a first-year college student living in a residence hall, or if you have one of several medical conditions. You may also need additional booster doses.  Hepatitis A vaccine. You may need this if you have certain conditions or if you travel or work in places where you may be exposed to hepatitis A.  Hepatitis B vaccine. You may need this if you have certain conditions or if you travel or work in places where you may be exposed to hepatitis B.  Haemophilus influenzae type b (Hib) vaccine. You may need this if  you have certain risk factors.  Talk to your health care provider about which screenings and vaccines you need and how often you need them. This information is not intended to replace advice given to you by your health care provider. Make sure you discuss any questions you have with your health care provider. Document Released: 10/10/2001 Document Revised: 05/03/2016  Document Reviewed: 06/15/2015 Elsevier Interactive Patient Education  [employer].

## 2018-06-14 NOTE — Assessment & Plan Note (Signed)
Well controlled, no changes to meds. Encouraged heart healthy diet such as the DASH diet and exercise as tolerated.  °

## 2018-06-14 NOTE — Assessment & Plan Note (Signed)
con't healthy weight and wellness 

## 2018-06-20 ENCOUNTER — Ambulatory Visit (INDEPENDENT_AMBULATORY_CARE_PROVIDER_SITE_OTHER): Payer: Self-pay | Admitting: Family Medicine

## 2018-06-20 ENCOUNTER — Ambulatory Visit (INDEPENDENT_AMBULATORY_CARE_PROVIDER_SITE_OTHER): Payer: Self-pay | Admitting: Psychology
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
## 2018-07-04 ENCOUNTER — Ambulatory Visit (INDEPENDENT_AMBULATORY_CARE_PROVIDER_SITE_OTHER): Payer: BLUE CROSS/BLUE SHIELD | Admitting: Family Medicine

## 2018-07-04 ENCOUNTER — Ambulatory Visit (INDEPENDENT_AMBULATORY_CARE_PROVIDER_SITE_OTHER): Payer: BLUE CROSS/BLUE SHIELD | Admitting: Psychology

## 2018-07-04 VITALS — BP 112/62 | HR 70 | Temp 98.0°F | Ht 66.0 in | Wt >= 6400 oz

## 2018-07-04 DIAGNOSIS — Z6841 Body Mass Index (BMI) 40.0 and over, adult: Secondary | ICD-10-CM

## 2018-07-04 DIAGNOSIS — Z9189 Other specified personal risk factors, not elsewhere classified: Secondary | ICD-10-CM | POA: Diagnosis not present

## 2018-07-04 DIAGNOSIS — R000 Tachycardia, unspecified: Secondary | ICD-10-CM

## 2018-07-04 DIAGNOSIS — F331 Major depressive disorder, recurrent, moderate: Secondary | ICD-10-CM | POA: Diagnosis not present

## 2018-07-04 DIAGNOSIS — I10 Essential (primary) hypertension: Secondary | ICD-10-CM

## 2018-07-04 DIAGNOSIS — R7303 Prediabetes: Secondary | ICD-10-CM | POA: Diagnosis not present

## 2018-07-04 MED ORDER — METOPROLOL SUCCINATE ER 50 MG PO TB24: ORAL_TABLET | ORAL | 0 refills | Status: DC

## 2018-07-08 NOTE — Progress Notes (Signed)
Office: [phone]  /  Fax: [phone]   HPI:   Chief Complaint: OBESITY Brandy Saunders is here to discuss her progress with her obesity treatment plan. She is on the lower carbohydrate, vegetable and lean protein rich diet plan and is following her eating plan approximately 70 % of the time. She states she is exercising 0 minutes 0 times per week. Brandy Saunders has had a recent move and she moved in with her boyfriend. Her company just filed for bankruptcy; so she has stress about the security of her job. Brandy Saunders still has foot issues. Her weight is (!) 419 lb (190.1 kg) today and has had a weight gain of 1 pound over a period of 4 weeks since her last visit. She has lost 28 lbs since starting treatment with [place].  Pre-Diabetes Brandy Saunders has a diagnosis of prediabetes. Her Hgb A1c increased from 5.7 to 5.8 and her insulin level is minimally improved. She admits to nausea and GI side effects of metformin. Brandy Saunders continues to work on diet and exercise to decrease risk of diabetes.   Tachycardia Brandy Saunders has a diagnosis of tachycardia. She currently has no symptoms and she denies chest pain, chest pressure or headache.  At risk for cardiovascular disease Brandy Saunders is at a higher than average risk for cardiovascular disease due to obesity and tachycardia. She currently denies any chest pain.   ALLERGIES: Allergies  Allergen Reactions  . Ibuprofen Diarrhea  . Meloxicam     Pt stated, "It made my emotions out of wack; it made my left hand numb"  . Metformin And Related Diarrhea    MEDICATIONS: Current Outpatient Medications on File Prior to Visit  Medication Sig Dispense Refill  . acetaminophen (TYLENOL) 500 MG tablet Take 500 mg by mouth every 6 (six) hours as needed.    . Ascorbic Acid (VITAMIN C) 1000 MG tablet Take 1,000 mg by mouth daily.    Marland Kitchen BIOTIN 5000 PO Take 10,000 mg by mouth daily.     . Cyanocobalamin (VITAMIN B 12 PO) Take 2,500 mcg by mouth daily.    . metFORMIN (GLUCOPHAGE)  500 MG tablet Take 1 tablet (500 mg total) by mouth daily with breakfast. 90 tablet 0  . Multiple Vitamins-Calcium (ONE-A-DAY WOMENS PO) Take 1 tablet by mouth daily.     . norethindrone-ethinyl estradiol (BLISOVI FE 1/20) 1-20 MG-MCG tablet Take 1 tablet by mouth daily. 84 tablet 3  . Oil Base OIL 1 Dose by Does not apply route at bedtime as needed. CBD oil per pt.    . SUMAtriptan (IMITREX) 100 MG tablet Take 1 tablet (100 mg total) by mouth 2 (two) times daily as needed for migraine. 60 tablet 0  . Vitamin D, Ergocalciferol, (DRISDOL) 50000 units CAPS capsule Take 1 capsule (50,000 Units total) by mouth every 7 (seven) days. 4 capsule 0   No current facility-administered medications on file prior to visit.     PAST MEDICAL HISTORY: Past Medical History:  Diagnosis Date  . Anxiety   . Asthma   . Back pain   . Binge eating   . Carpal tunnel syndrome, bilateral   . Chest pain   . Common migraine with intractable migraine 02/02/2017  . Constipation   . Depression   . Dyspnea   . Fatty liver   . GERD (gastroesophageal reflux disease)   . Hypertension   . Joint pain   . Leg edema   . Morbidly obese (Brandy Saunders)   . Palpitations   . PCOS (polycystic ovarian  syndrome)   . Prediabetes   . Vitamin D deficiency     PAST SURGICAL HISTORY: Past Surgical History:  Procedure Laterality Date  . TONSILECTOMY, ADENOIDECTOMY, BILATERAL MYRINGOTOMY AND TUBES  1994    SOCIAL HISTORY: Social History   Tobacco Use  . Smoking status: Former Smoker    Types: Cigarettes  . Smokeless tobacco: Never Used  Substance Use Topics  . Alcohol use: Yes    Alcohol/week: 3.0 standard drinks    Types: 3 Glasses of wine per week  . Drug use: No    Types: Marijuana    Comment: once every 6 months, taking CBD    FAMILY HISTORY: Family History  Problem Relation Age of Onset  . Hypertension Mother   . Hyperlipidemia Mother   . Obesity Mother   . Stroke Father   . Obesity Father   . Alcoholism Father    . Drug abuse Father   . Diabetes Maternal Uncle   . Heart disease Maternal Grandmother   . Colon cancer Neg Hx     ROS: Review of Systems  Constitutional: Negative for weight loss.  Cardiovascular: Negative for chest pain.       Negative for chest pressure  Gastrointestinal: Positive for diarrhea, nausea and vomiting.  Neurological: Negative for headaches.    PHYSICAL EXAM: Blood pressure 112/62, pulse 70, temperature 98 F (36.7 C), temperature source Oral, height 5\' 6"  (1.676 m), weight (!) 419 lb (190.1 kg), SpO2 99 %. Body mass index is 67.63 kg/m. Physical Exam  Constitutional: She is oriented to person, place, and time. She appears well-developed and well-nourished.  Cardiovascular: Tachycardia present.  Pulmonary/Chest: Effort normal.  Musculoskeletal: Normal range of motion.  Neurological: She is oriented to person, place, and time.  Skin: Skin is warm and dry.  Psychiatric: She has a normal mood and affect. Her behavior is normal.  Vitals reviewed.   RECENT LABS AND TESTS: BMET    Component Value Date/Time   NA 137 05/07/2018 1605   NA 141 05/02/2018 1114   K 4.6 05/07/2018 1605   CL 101 05/07/2018 1605   CO2 28 05/07/2018 1605   GLUCOSE 95 05/07/2018 1605   BUN 18 05/07/2018 1605   BUN 19 05/02/2018 1114   CREATININE 0.70 05/07/2018 1605   CREATININE 0.67 09/28/2014 1146   CALCIUM 9.3 05/07/2018 1605   GFRNONAA 107 05/02/2018 1114   GFRAA 123 05/02/2018 1114   Lab Results  Component Value Date   HGBA1C 5.8 (H) 05/02/2018   HGBA1C 5.7 (H) 01/17/2018   HGBA1C 6.0 (H) 10/11/2017   HGBA1C 6.1 09/13/2017   HGBA1C 5.9 06/12/2017   Lab Results  Component Value Date   INSULIN 30.3 (H) 05/02/2018   INSULIN 39.5 (H) 01/17/2018   INSULIN 73.6 (H) 10/11/2017   CBC    Component Value Date/Time   WBC 7.2 05/07/2018 1605   RBC 4.93 05/07/2018 1605   HGB 13.9 05/07/2018 1605   HGB 13.0 10/11/2017 1022   HCT 41.2 05/07/2018 1605   HCT 39.9 10/11/2017  1022   PLT 272.0 05/07/2018 1605   MCV 83.5 05/07/2018 1605   MCV 83 10/11/2017 1022   MCH 27.1 10/11/2017 1022   MCH 24.5 (L) 09/28/2014 1146   MCHC 33.8 05/07/2018 1605   RDW 14.2 05/07/2018 1605   RDW 13.8 10/11/2017 1022   LYMPHSABS 2.6 05/07/2018 1605   LYMPHSABS 1.7 10/11/2017 1022   MONOABS 0.3 05/07/2018 1605   EOSABS 0.1 05/07/2018 1605   EOSABS 0.1 10/11/2017  1022   BASOSABS 0.0 05/07/2018 1605   BASOSABS 0.0 10/11/2017 1022   Iron/TIBC/Ferritin/ %Sat No results found for: IRON, TIBC, FERRITIN, IRONPCTSAT Lipid Panel     Component Value Date/Time   CHOL 151 06/14/2018 0915   CHOL 143 10/11/2017 1022   TRIG 70.0 06/14/2018 0915   HDL 55.60 06/14/2018 0915   HDL 53 10/11/2017 1022   CHOLHDL 3 06/14/2018 0915   VLDL 14.0 06/14/2018 0915   LDLCALC 81 06/14/2018 0915   LDLCALC 74 10/11/2017 1022   Hepatic Function Panel     Component Value Date/Time   PROT 7.0 05/07/2018 1605   PROT 7.0 05/02/2018 1114   ALBUMIN 4.1 05/07/2018 1605   ALBUMIN 4.4 05/02/2018 1114   AST 13 05/07/2018 1605   ALT 19 05/07/2018 1605   ALKPHOS 53 05/07/2018 1605   BILITOT 0.4 05/07/2018 1605   BILITOT 0.2 05/02/2018 1114   BILIDIR 0.1 09/28/2014 1146   IBILI 0.4 09/28/2014 1146      Component Value Date/Time   TSH 2.18 05/07/2018 1605   TSH 1.880 10/11/2017 1022   TSH 1.98 06/12/2017 1344   Results for HAZYL, MARSEILLE (MRN [id number]) as of 07/08/2018 15:07  Ref. Range 05/02/2018 11:14  Vitamin D, 25-Hydroxy Latest Ref Range: 30.0 - 100.0 ng/mL 41.3   ASSESSMENT AND PLAN: Prediabetes  Tachycardia  At risk for heart disease  Class 3 severe obesity with serious comorbidity and body mass index (BMI) of 60.0 to 69.9 in adult, unspecified obesity type ([place])  Essential hypertension - Plan: metoprolol succinate (TOPROL XL) 50 MG 24 hr tablet  PLAN:  Pre-Diabetes Vika will continue to work on weight loss, exercise, and decreasing simple carbohydrates in her diet to  help decrease the risk of diabetes. We dicussed metformin including benefits and risks. She was informed that eating too many simple carbohydrates or too many calories at one sitting increases the likelihood of GI side effects. Amare is to consider Victoza and will stop metformin. Connor agreed to follow up with [place] as directed to monitor her progress.  Tachycardia Melis agrees to continue Metoprolol succinate 50 mg 2 tablets PO daily #60 with no refills and follow up with our clinic in 2 weeks.  Cardiovascular risk counseling Sheryle was given extended (15 minutes) coronary artery disease prevention counseling today. She is 27 y.o. female and has risk factors for heart disease including obesity and tachycardia. We discussed intensive lifestyle modifications today with an emphasis on specific weight loss instructions and strategies. Pt was also informed of the importance of increasing exercise and decreasing saturated fats to help prevent heart disease.  Obesity Marine is currently in the action stage of change. As such, her goal is to continue with weight loss efforts She has agreed to portion control better and make smarter food choices, such as increase vegetables and decrease simple carbohydrates and starting to track again. Kadijah has been instructed to work up to a goal of 150 minutes of combined cardio and strengthening exercise per week for weight loss and overall health benefits. We discussed the following Behavioral Modification Strategies today: planning for success, increasing lean protein intake, increasing vegetables and work on meal planning and easy cooking plans  Dinia has agreed to follow up with our clinic in 2 weeks. She was informed of the importance of frequent follow up visits to maximize her success with intensive lifestyle modifications for her multiple health conditions.   OBESITY BEHAVIORAL INTERVENTION VISIT  Today's visit was # 14   Starting  weight:  447 lbs Starting date: 10/11/2017 Today's weight : 419 lbs Today's date: 07/04/2018 Total lbs lost to date: 8  ASK: We discussed the diagnosis of obesity with [name] today and Jericka agreed to give [place] permission to discuss obesity behavioral modification therapy today.  ASSESS: Verlin has the diagnosis of obesity and her BMI today is 67.66 Alyda is in the action stage of change   ADVISE: Jachelle was educated on the multiple health risks of obesity as well as the benefit of weight loss to improve her health. She was advised of the need for long term treatment and the importance of lifestyle modifications to improve her current health and to decrease her risk of future health problems.  AGREE: Multiple dietary modification options and treatment options were discussed and  Shanecia agreed to follow the recommendations documented in the above note.  ARRANGE: Jannat was educated on the importance of frequent visits to treat obesity as outlined per CMS and USPSTF guidelines and agreed to schedule her next follow up appointment today.  I, Doreene Nest, am acting as transcriptionist for Eber Jones, MD  I have reviewed the above documentation for accuracy and completeness, and I agree with the above. - Ilene Qua, MD

## 2018-07-11 DIAGNOSIS — M25671 Stiffness of right ankle, not elsewhere classified: Secondary | ICD-10-CM | POA: Diagnosis not present

## 2018-07-11 DIAGNOSIS — M25571 Pain in right ankle and joints of right foot: Secondary | ICD-10-CM | POA: Diagnosis not present

## 2018-07-11 DIAGNOSIS — M25474 Effusion, right foot: Secondary | ICD-10-CM | POA: Diagnosis not present

## 2018-07-11 DIAGNOSIS — M25471 Effusion, right ankle: Secondary | ICD-10-CM | POA: Diagnosis not present

## 2018-07-17 DIAGNOSIS — M25571 Pain in right ankle and joints of right foot: Secondary | ICD-10-CM | POA: Diagnosis not present

## 2018-07-17 DIAGNOSIS — M25474 Effusion, right foot: Secondary | ICD-10-CM | POA: Diagnosis not present

## 2018-07-17 DIAGNOSIS — M25471 Effusion, right ankle: Secondary | ICD-10-CM | POA: Diagnosis not present

## 2018-07-17 DIAGNOSIS — M25671 Stiffness of right ankle, not elsewhere classified: Secondary | ICD-10-CM | POA: Diagnosis not present

## 2018-07-22 ENCOUNTER — Encounter: Payer: Self-pay | Admitting: Family Medicine

## 2018-07-22 DIAGNOSIS — R002 Palpitations: Secondary | ICD-10-CM

## 2018-07-22 NOTE — Progress Notes (Addendum)
Office: [phone]  /  Fax: [phone]   Date: July 23, 2018  Time Seen: 8:00am Duration: 30 minutes Provider: Glennie Saunders, Psy.D. Type of Session: Individual Therapy   HPI: Brandy Saunders was referred by Dr. Ilene Saunders due to depression with emotional eating behaviors, and was seen for an initial appointment by this provider on July 04, 2018. Per the note for the visit with Dr. Ilene Saunders on June 06, 2018, "Brandy Saunders binging behaviors and shestruggleswith emotional eating and using food for comfort to the extent that it is negatively impacting herhealth. Sheoften snacks when sheis not hungry. [name] feels sheis out of control and then feels guilty that shemade poor food choices. Brandy Saunders been working on behavior modification techniques to help reduce heremotional eating and has been somewhat successful.Sheshows no sign of suicidal or homicidal ideations." In addition, per the note for the initial visit with Dr. Ilene Saunders on October 11, 2017, Brandy Saunders has been heavy most of her life and she started gaining weight at birth. Her heaviest weight ever was 450 pounds. During the initial appointment with Dr. Adair Saunders, Brandy Saunders reported experiencing the following: significant food cravings issues; snacking frequently in the evenings; skipping meals frequently; frequently drinking liquids with calories; frequently making poor food choices; frequently eating larger portions than normal; binge eating behaviors; and struggling with emotional eating.  During the initial appointment with this provider, Brandy Saunders reported she does not feel she is where she wants to be and she also wants a healthy relationship with food. Her last episode of overeating eating was approximately one month prior to the initial appointment with this provider. Brandy Saunders reported she constantly thinks about eating and is experiencing "ups and downs" with the prescribed meal plan.  Moreover, Brandy Saunders reported emotional eating started at the age of 42. She indicated she began overeating around late middle school to early high school and attributed it to her "rocky relationship" with her mother. She denied history of engagement in purging and other compensatory strategies. While Brandy Saunders initially denied a history of binge eating, she reported she was diagnosed with binge eating disorder in college. Furthermore, Brandy Saunders was asked to complete a questionnaire assessing various behaviors related to emotional eating. Brandy Saunders endorsed the following: overeat when you are celebrating, experience food cravings on a regular basis, eat certain foods when you are anxious, stressed, depressed, or your feelings are hurt, use food to help you cope with emotional situations, find food is comforting to you, overeat when you are angry or upset, overeat when you are worried about something, overeat frequently when you are bored or lonely, eat to help you stay awake and eat as a reward.   Session Content: Session focused on the following treatment goal: decrease emotional eating. The session was initiated with the administration of the PHQ-9 and GAD-7, as well as a brief check-in. Brandy Saunders reported experiencing heart palpitations, but denied pain. She explained they are not consistent and reported a plan to speak with Dr. Adair Saunders during their appointment today. Notably, Brandy Saunders shared a history of experiencing the aforementioned, and she noted receiving medical attention as appropriate. Regarding hunger, she reported engaging in meal prep for breakfast and lunch; however, she reported she is still frequently hungry. Thus, this was explored further and it was ascertained that Brandy Saunders was missing lunch while at work. As such, she was engaged in problem solving to assist with increasing consumption of her lunch portion of the meal plan. Regarding emotional hunger, Brandy Saunders reported, "There was one day where I  was impulsive  with my choices." She could not recall details; however, she reported consuming two bags of candy and one bag of chips.  Remainder of today's appointment focused on behavioral strategies to assist with Thanksgiving. A handout was provided.  Overall, Brandy Saunders was receptive to today's appointment as evidenced by her openness to sharing, responsiveness to feedback, and willingness to implement behavioral strategies. She also noted, "I feel more in control."  Mental Status Examination: Brandy Saunders arrived on time for the appointment. She presented as appropriately dressed and groomed. Brandy Saunders appeared her stated age and demonstrated adequate orientation to time, place, person, and purpose of the appointment. She also demonstrated appropriate eye contact. No psychomotor abnormalities or behavioral peculiarities noted. Her mood was euthymic with congruent affect. Her thought processes were logical, linear, and goal-directed. No hallucinations, delusions, bizarre thinking or behavior reported or observed. Judgment, insight, and impulse control appeared to be grossly intact. There was no evidence of paraphasias (i.e., errors in speech, gross mispronunciations, and word substitutions), repetition deficits, or disturbances in volume or prosody (i.e., rhythm and intonation). There was no evidence of attention or memory impairments. Brandy Saunders denied current suicidal and homicidal ideation, intent or plan.  Structured Assessment Results: The Patient Health Questionnaire-9 (PHQ-9) is a self-report measure that assesses symptoms and severity of depression over the course of the last two weeks. Brandy Saunders obtained a score of 10 suggesting moderate depression. Brandy Saunders finds the endorsed symptoms to be somewhat difficult. Depression screen PHQ 2/9 07/23/2018  Decreased Interest 1  Down, Depressed, Hopeless 1  PHQ - 2 Score 2  Altered sleeping 2  Tired, decreased energy 2  Change in appetite 1  Feeling bad or  failure about yourself  1  Trouble concentrating 2  Moving slowly or fidgety/restless 0  Suicidal thoughts 0  PHQ-9 Score 10  Difficult doing work/chores -   The Generalized Anxiety Disorder-7 (GAD-7) is a brief self-report measure that assesses symptoms of anxiety over the course of the last two weeks. Brandy Saunders obtained a score of 16 suggesting severe anxiety. GAD 7 : Generalized Anxiety Score 07/23/2018  Nervous, Anxious, on Edge 2  Control/stop worrying 2  Worry too much - different things 3  Trouble relaxing 2  Restless 1  Easily annoyed or irritable 3  Afraid - awful might happen 3  Total GAD 7 Score 16  Anxiety Difficulty Very difficult   Interventions: Brandy Saunders was administered the PHQ-9 and GAD-7 for symptom monitoring. Content from the last session was reviewed. Throughout today's session, empathic reflections and validation were provided. This provider engaged Brandy Saunders in problem solving. Psychoeducation regarding behavioral strategies for Thanksgiving was provided.  DSM-5 Diagnosis: 296.32 (F33.1) Major Depressive Disorder, Recurrent Episode, Moderate, With Anxious Distress, Moderate  Treatment Goal & Progress: Brandy Saunders was seen for an initial appointment with this provider on July 04, 2018 during which the following treatment goal was established: decrease emotional eating. Brandy Saunders has demonstrated progress in her goal of decreasing emotional eating as evidenced by increased awareness of hunger patterns, and willingness to implement behavioral strategies.  Plan: Brandy Saunders continues to appear able and willing to participate as evidenced by engagement in reciprocal conversation, and asking questions for clarification as appropriate. The next appointment will be scheduled in two weeks. The next session will focus on identifying triggers for emotional eating.

## 2018-07-23 ENCOUNTER — Ambulatory Visit (INDEPENDENT_AMBULATORY_CARE_PROVIDER_SITE_OTHER): Payer: BLUE CROSS/BLUE SHIELD | Admitting: Psychology

## 2018-07-23 ENCOUNTER — Ambulatory Visit (INDEPENDENT_AMBULATORY_CARE_PROVIDER_SITE_OTHER): Payer: BLUE CROSS/BLUE SHIELD | Admitting: Family Medicine

## 2018-07-23 VITALS — BP 119/82 | HR 71 | Temp 97.5°F | Ht 66.0 in | Wt >= 6400 oz

## 2018-07-23 DIAGNOSIS — R7303 Prediabetes: Secondary | ICD-10-CM | POA: Diagnosis not present

## 2018-07-23 DIAGNOSIS — Z9189 Other specified personal risk factors, not elsewhere classified: Secondary | ICD-10-CM | POA: Diagnosis not present

## 2018-07-23 DIAGNOSIS — Z6841 Body Mass Index (BMI) 40.0 and over, adult: Secondary | ICD-10-CM

## 2018-07-23 DIAGNOSIS — F331 Major depressive disorder, recurrent, moderate: Secondary | ICD-10-CM | POA: Diagnosis not present

## 2018-07-23 DIAGNOSIS — R002 Palpitations: Secondary | ICD-10-CM

## 2018-07-23 MED ORDER — METOPROLOL SUCCINATE ER 50 MG PO TB24: ORAL_TABLET | ORAL | 0 refills | Status: DC

## 2018-07-23 NOTE — Telephone Encounter (Signed)
Refer to cardiology 

## 2018-07-27 ENCOUNTER — Other Ambulatory Visit (INDEPENDENT_AMBULATORY_CARE_PROVIDER_SITE_OTHER): Payer: Self-pay | Admitting: Family Medicine

## 2018-07-27 DIAGNOSIS — R002 Palpitations: Secondary | ICD-10-CM

## 2018-07-29 NOTE — Progress Notes (Signed)
Office: [phone]  /  Fax: [phone]   HPI:   Chief Complaint: OBESITY Brandy Saunders is here to discuss her progress with her obesity treatment plan. She is on the portion control better and make smarter food choices plan and is following her eating plan approximately 75 % of the time. She states she is exercising 0 minutes 0 times per week. Brandy Saunders is feeling better that at her last visit and she is having occasional palpitations (was just referred to cardiology). Brandy Saunders is learning how to avoid over indulging in carbohydrates when eating with her boyfriend. Her weight is (!) 417 lb (189.1 kg) today and has had a weight loss of 2 pounds over a period of 3 weeks since her last visit. She has lost 30 lbs since starting treatment with [place].  Palpitations  Brandy Saunders has palpitations occasionally and she denies any symptoms currently. She denies chest pain and chest pressure.  At risk for cardiovascular disease Brandy Saunders is at a higher than average risk for cardiovascular disease due to obesity and palpitations. She currently denies any chest pain.  Pre-Diabetes Brandy Saunders has a diagnosis of prediabetes based on her elevated Hgb A1c and was informed this puts her at greater risk of developing diabetes. She is not on metformin, secondary to GI side effects. She would like to work on her meal plan until February and then will reconsider Victoza. Brandy Saunders continues to work on diet and exercise to decrease risk of diabetes. She denies hypoglycemia.  ALLERGIES: Allergies  Allergen Reactions  . Ibuprofen Diarrhea  . Meloxicam     Pt stated, "It made my emotions out of wack; it made my left hand numb"  . Metformin And Related Diarrhea    MEDICATIONS: Current Outpatient Medications on File Prior to Visit  Medication Sig Dispense Refill  . acetaminophen (TYLENOL) 500 MG tablet Take 500 mg by mouth every 6 (six) hours as needed.    . Ascorbic Acid (VITAMIN C) 1000 MG tablet Take 1,000 mg by mouth  daily.    Marland Kitchen BIOTIN 5000 PO Take 10,000 mg by mouth daily.     . Cyanocobalamin (VITAMIN B 12 PO) Take 2,500 mcg by mouth daily.    . metFORMIN (GLUCOPHAGE) 500 MG tablet Take 1 tablet (500 mg total) by mouth daily with breakfast. 90 tablet 0  . Multiple Vitamins-Calcium (ONE-A-DAY WOMENS PO) Take 1 tablet by mouth daily.     . norethindrone-ethinyl estradiol (BLISOVI FE 1/20) 1-20 MG-MCG tablet Take 1 tablet by mouth daily. 84 tablet 3  . Oil Base OIL 1 Dose by Does not apply route at bedtime as needed. CBD oil per pt.    . SUMAtriptan (IMITREX) 100 MG tablet Take 1 tablet (100 mg total) by mouth 2 (two) times daily as needed for migraine. 60 tablet 0  . Vitamin D, Ergocalciferol, (DRISDOL) 50000 units CAPS capsule Take 1 capsule (50,000 Units total) by mouth every 7 (seven) days. 4 capsule 0   No current facility-administered medications on file prior to visit.     PAST MEDICAL HISTORY: Past Medical History:  Diagnosis Date  . Anxiety   . Asthma   . Back pain   . Binge eating   . Carpal tunnel syndrome, bilateral   . Chest pain   . Common migraine with intractable migraine 02/02/2017  . Constipation   . Depression   . Dyspnea   . Fatty liver   . GERD (gastroesophageal reflux disease)   . Hypertension   . Joint pain   .  Leg edema   . Morbidly obese ([place])   . Palpitations   . PCOS (polycystic ovarian syndrome)   . Prediabetes   . Vitamin D deficiency     PAST SURGICAL HISTORY: Past Surgical History:  Procedure Laterality Date  . TONSILECTOMY, ADENOIDECTOMY, BILATERAL MYRINGOTOMY AND TUBES  1994    SOCIAL HISTORY: Social History   Tobacco Use  . Smoking status: Former Smoker    Types: Cigarettes  . Smokeless tobacco: Never Used  Substance Use Topics  . Alcohol use: Yes    Alcohol/week: 3.0 standard drinks    Types: 3 Glasses of wine per week  . Drug use: No    Types: Marijuana    Comment: once every 6 months, taking CBD    FAMILY HISTORY: Family History    Problem Relation Age of Onset  . Hypertension Mother   . Hyperlipidemia Mother   . Obesity Mother   . Stroke Father   . Obesity Father   . Alcoholism Father   . Drug abuse Father   . Diabetes Maternal Uncle   . Heart disease Maternal Grandmother   . Colon cancer Neg Hx     ROS: Review of Systems  Constitutional: Positive for weight loss.  Cardiovascular: Positive for palpitations. Negative for chest pain.       Negative for chest pressure  Gastrointestinal: Negative for diarrhea, nausea and vomiting.  Endo/Heme/Allergies:       Negative for hypoglycemia    PHYSICAL EXAM: Blood pressure 119/82, pulse 71, temperature (!) 97.5 F (36.4 C), temperature source Oral, height 5\' 6"  (1.676 m), weight (!) 417 lb (189.1 kg), SpO2 93 %. Body mass index is 67.31 kg/m. Physical Exam  Constitutional: She is oriented to person, place, and time. She appears well-developed and well-nourished.  Cardiovascular: Normal rate.  Pulmonary/Chest: Effort normal.  Musculoskeletal: Normal range of motion.  Neurological: She is oriented to person, place, and time.  Skin: Skin is warm and dry.  Psychiatric: She has a normal mood and affect. Her behavior is normal.  Vitals reviewed.   RECENT LABS AND TESTS: BMET    Component Value Date/Time   NA 137 05/07/2018 1605   NA 141 05/02/2018 1114   K 4.6 05/07/2018 1605   CL 101 05/07/2018 1605   CO2 28 05/07/2018 1605   GLUCOSE 95 05/07/2018 1605   BUN 18 05/07/2018 1605   BUN 19 05/02/2018 1114   CREATININE 0.70 05/07/2018 1605   CREATININE 0.67 09/28/2014 1146   CALCIUM 9.3 05/07/2018 1605   GFRNONAA 107 05/02/2018 1114   GFRAA 123 05/02/2018 1114   Lab Results  Component Value Date   HGBA1C 5.8 (H) 05/02/2018   HGBA1C 5.7 (H) 01/17/2018   HGBA1C 6.0 (H) 10/11/2017   HGBA1C 6.1 09/13/2017   HGBA1C 5.9 06/12/2017   Lab Results  Component Value Date   INSULIN 30.3 (H) 05/02/2018   INSULIN 39.5 (H) 01/17/2018   INSULIN 73.6 (H)  10/11/2017   CBC    Component Value Date/Time   WBC 7.2 05/07/2018 1605   RBC 4.93 05/07/2018 1605   HGB 13.9 05/07/2018 1605   HGB 13.0 10/11/2017 1022   HCT 41.2 05/07/2018 1605   HCT 39.9 10/11/2017 1022   PLT 272.0 05/07/2018 1605   MCV 83.5 05/07/2018 1605   MCV 83 10/11/2017 1022   MCH 27.1 10/11/2017 1022   MCH 24.5 (L) 09/28/2014 1146   MCHC 33.8 05/07/2018 1605   RDW 14.2 05/07/2018 1605   RDW 13.8 10/11/2017 1022  LYMPHSABS 2.6 05/07/2018 1605   LYMPHSABS 1.7 10/11/2017 1022   MONOABS 0.3 05/07/2018 1605   EOSABS 0.1 05/07/2018 1605   EOSABS 0.1 10/11/2017 1022   BASOSABS 0.0 05/07/2018 1605   BASOSABS 0.0 10/11/2017 1022   Iron/TIBC/Ferritin/ %Sat No results found for: IRON, TIBC, FERRITIN, IRONPCTSAT Lipid Panel     Component Value Date/Time   CHOL 151 06/14/2018 0915   CHOL 143 10/11/2017 1022   TRIG 70.0 06/14/2018 0915   HDL 55.60 06/14/2018 0915   HDL 53 10/11/2017 1022   CHOLHDL 3 06/14/2018 0915   VLDL 14.0 06/14/2018 0915   LDLCALC 81 06/14/2018 0915   LDLCALC 74 10/11/2017 1022   Hepatic Function Panel     Component Value Date/Time   PROT 7.0 05/07/2018 1605   PROT 7.0 05/02/2018 1114   ALBUMIN 4.1 05/07/2018 1605   ALBUMIN 4.4 05/02/2018 1114   AST 13 05/07/2018 1605   ALT 19 05/07/2018 1605   ALKPHOS 53 05/07/2018 1605   BILITOT 0.4 05/07/2018 1605   BILITOT 0.2 05/02/2018 1114   BILIDIR 0.1 09/28/2014 1146   IBILI 0.4 09/28/2014 1146      Component Value Date/Time   TSH 2.18 05/07/2018 1605   TSH 1.880 10/11/2017 1022   TSH 1.98 06/12/2017 1344     Ref. Range 05/02/2018 11:14  Vitamin D, 25-Hydroxy Latest Ref Range: 30.0 - 100.0 ng/mL 41.3   ASSESSMENT AND PLAN: Palpitations - Plan: metoprolol succinate (TOPROL XL) 50 MG 24 hr tablet  Prediabetes  At risk for heart disease  Class 3 severe obesity with serious comorbidity and body mass index (BMI) of 60.0 to 69.9 in adult, unspecified obesity type  ([place])  PLAN:  Palpitations  Brandy Saunders agrees to continue metoprolol succinate 50 mg 2 tablets by mouth daily #60 with no refills and follow up as directed.  Cardiovascular risk counseling Brandy Saunders was given extended (15 minutes) coronary artery disease prevention counseling today. She is 27 y.o. female and has risk factors for heart disease including obesity and palpitations. We discussed intensive lifestyle modifications today with an emphasis on specific weight loss instructions and strategies. Pt was also informed of the importance of increasing exercise and decreasing saturated fats to help prevent heart disease.  Pre-Diabetes Brandy Saunders will continue to work on weight loss, exercise, and decreasing simple carbohydrates in her diet to help decrease the risk of diabetes. She was informed that eating too many simple carbohydrates or too many calories at one sitting increases the likelihood of GI side effects. We will discuss Victoza in February. We will check labs in January and Brandy Saunders agreed to follow up with [place] as directed to monitor her progress.  Obesity Brandy Saunders is currently in the action stage of change. As such, her goal is to continue with weight loss efforts She has agreed to follow the Category 3 plan Brandy Saunders has been instructed to work up to a goal of 150 minutes of combined cardio and strengthening exercise per week for weight loss and overall health benefits. We discussed the following Behavioral Modification Strategies today: planning for success, increasing lean protein intake, work on meal planning and easy cooking plans, dealing with family or coworker sabotage and holiday eating strategies   Brandy Saunders has agreed to follow up with our clinic in 2 weeks. She was informed of the importance of frequent follow up visits to maximize her success with intensive lifestyle modifications for her multiple health conditions.   OBESITY BEHAVIORAL INTERVENTION VISIT  Today's visit was #  15  Starting weight: 447 lbs Starting date: 10/11/2017 Today's weight : 417 lbs Today's date: 07/23/2018 Total lbs lost to date: 30   ASK: We discussed the diagnosis of obesity with Brandy Saunders today and Brandy Saunders agreed to give [place] permission to discuss obesity behavioral modification therapy today.  ASSESS: Brandy Saunders has the diagnosis of obesity and her BMI today is 67.34 Brandy Saunders is in the action stage of change   ADVISE: Brandy Saunders was educated on the multiple health risks of obesity as well as the benefit of weight loss to improve her health. She was advised of the need for long term treatment and the importance of lifestyle modifications to improve her current health and to decrease her risk of future health problems.  AGREE: Multiple dietary modification options and treatment options were discussed and  Brandy Saunders agreed to follow the recommendations documented in the above note.  ARRANGE: Brandy Saunders was educated on the importance of frequent visits to treat obesity as outlined per CMS and USPSTF guidelines and agreed to schedule her next follow up appointment today.  I, Doreene Nest, am acting as transcriptionist for Eber Jones, MD  I have reviewed the above documentation for accuracy and completeness, and I agree with the above. - Ilene Qua, MD

## 2018-07-30 ENCOUNTER — Encounter (INDEPENDENT_AMBULATORY_CARE_PROVIDER_SITE_OTHER): Payer: Self-pay

## 2018-07-30 ENCOUNTER — Telehealth (INDEPENDENT_AMBULATORY_CARE_PROVIDER_SITE_OTHER): Payer: Self-pay | Admitting: Family Medicine

## 2018-07-30 NOTE — Telephone Encounter (Signed)
Pt states she was seen last week and her BP refill was suppose to be sent over to [facility] on [address].  Please advise. Thank you

## 2018-07-30 NOTE — Telephone Encounter (Signed)
MyChart sent to patient. Renee Ramus, LPN

## 2018-08-08 ENCOUNTER — Ambulatory Visit: Payer: BLUE CROSS/BLUE SHIELD | Admitting: Family Medicine

## 2018-08-11 ENCOUNTER — Encounter (HOSPITAL_COMMUNITY): Payer: Self-pay | Admitting: Emergency Medicine

## 2018-08-11 ENCOUNTER — Ambulatory Visit (HOSPITAL_COMMUNITY)
Admission: EM | Admit: 2018-08-11 | Discharge: 2018-08-11 | Disposition: A | Discharge status: Home / Self Care | Payer: BLUE CROSS/BLUE SHIELD | Attending: Emergency Medicine | Admitting: Emergency Medicine

## 2018-08-11 DIAGNOSIS — J019 Acute sinusitis, unspecified: Secondary | ICD-10-CM | POA: Diagnosis not present

## 2018-08-11 MED ORDER — CETIRIZINE HCL 10 MG PO CAPS: 10.0000 mg | ORAL_CAPSULE | Freq: Every day | ORAL | 0 refills | Status: DC

## 2018-08-11 MED ORDER — BENZONATATE 200 MG PO CAPS: 200.0000 mg | ORAL_CAPSULE | Freq: Three times a day (TID) | ORAL | 0 refills | Status: AC | PRN

## 2018-08-11 MED ORDER — AMOXICILLIN-POT CLAVULANATE 875-125 MG PO TABS: 1.0000 | ORAL_TABLET | Freq: Two times a day (BID) | ORAL | 0 refills | Status: AC

## 2018-08-11 NOTE — ED Triage Notes (Signed)
Pt sts cough and URI sx

## 2018-08-11 NOTE — Discharge Instructions (Signed)
Please begin Augmentin twice daily for the next week-this will treat for a sinus infection Please begin daily allergy pill, Zyrtec or Claritin daily to help with congestion and drainage May use Tessalon as needed for cough or over-the-counter Delsym Continue to monitor blood pressure Follow-up if symptoms not improving, developing worsening palpitations, chest discomfort, shortness of breath, fevers

## 2018-08-11 NOTE — ED Provider Notes (Signed)
[place]    CSN: [id number] Arrival date & time: 08/11/18  1233     History   Chief Complaint Chief Complaint  Patient presents with  . Cough    appt 1330    HPI Brandy Saunders is a 27 y.o. female history of PCOS, obesity, hypertension, presenting today for evaluation of URI symptoms.  Patient began to have sore throat beginning over 1 week ago.  Symptoms developed into congestion and cough.  Feels symptoms have continued to worsen.  Started developed chest discomfort and back discomfort with coughing.  Patient also notes that she has had occasional palpitations, has been evaluated with a Holter monitor previously without abnormality.  She plans to follow-up with cardiology.  States that these palpitations have become more frequent since she has been sick.  She is also noted increased headaches and migraines since being sick as well.  Denies fevers.  Denies nausea vomiting or diarrhea.  HPI  Past Medical History:  Diagnosis Date  . Anxiety   . Asthma   . Back pain   . Binge eating   . Carpal tunnel syndrome, bilateral   . Chest pain   . Common migraine with intractable migraine 02/02/2017  . Constipation   . Depression   . Dyspnea   . Fatty liver   . GERD (gastroesophageal reflux disease)   . Hypertension   . Joint pain   . Leg edema   . Morbidly obese ([place])   . Palpitations   . PCOS (polycystic ovarian syndrome)   . Prediabetes   . Vitamin D deficiency     Patient Active Problem List   Diagnosis Date Noted  . Pain of left calf 12/14/2017  . Other fatigue 10/11/2017  . Shortness of breath on exertion 10/11/2017  . Prediabetes 10/11/2017  . Chronic left-sided thoracic back pain 05/23/2017  . Acute left-sided thoracic back pain 05/09/2017  . Migraine 05/09/2017  . Essential hypertension 05/09/2017  . Common migraine with intractable migraine 02/02/2017  . Chronic fatigue 01/06/2016  . Fatty liver 02/15/2015  . Esophageal reflux 02/15/2015  .  Late menses 07/30/2014  . Morbid obesity ([place]) 07/30/2014  . Asthma without acute exacerbation 07/30/2014  . IBS (irritable bowel syndrome) 07/30/2014  . Elevated BP 07/30/2014  . PCOS (polycystic ovarian syndrome) 11/25/2012  . Carpal tunnel syndrome, bilateral     Past Surgical History:  Procedure Laterality Date  . TONSILECTOMY, ADENOIDECTOMY, BILATERAL MYRINGOTOMY AND TUBES  1994    OB History    Gravida  0   Para  0   Term  0   Preterm  0   AB  0   Living  0     SAB  0   TAB  0   Ectopic  0   Multiple  0   Live Births  0            Home Medications    Prior to Admission medications   Medication Sig Start Date End Date Taking? Authorizing Provider  acetaminophen (TYLENOL) 500 MG tablet Take 500 mg by mouth every 6 (six) hours as needed.    [provider]  amoxicillin-clavulanate (AUGMENTIN) 875-125 MG tablet Take 1 tablet by mouth every 12 (twelve) hours for 7 days. 08/11/18 08/18/18  Bryla Burek C, PA-C  Ascorbic Acid (VITAMIN C) 1000 MG tablet Take 1,000 mg by mouth daily.    [provider]  benzonatate (TESSALON) 200 MG capsule Take 1 capsule (200 mg total) by mouth  3 (three) times daily as needed for up to 7 days for cough. 08/11/18 08/18/18  Nigil Braman C, PA-C  BIOTIN 5000 PO Take 10,000 mg by mouth daily.     [provider]  Cetirizine HCl 10 MG CAPS Take 1 capsule (10 mg total) by mouth daily for 10 days. 08/11/18 08/21/18  Keyion Knack C, PA-C  Cyanocobalamin (VITAMIN B 12 PO) Take 2,500 mcg by mouth daily.    [provider]  metFORMIN (GLUCOPHAGE) 500 MG tablet Take 1 tablet (500 mg total) by mouth daily with breakfast. 02/19/18   Eber Jones, MD  metoprolol succinate (TOPROL XL) 50 MG 24 hr tablet Take 2 tab po qd  with or immediately following a meal. 07/23/18   Eber Jones, MD  Multiple Vitamins-Calcium (ONE-A-DAY WOMENS PO) Take 1 tablet by mouth daily.     [provider]  norethindrone-ethinyl estradiol (BLISOVI FE 1/20) 1-20 MG-MCG tablet Take 1 tablet by mouth daily. 04/11/18   Ann Held, DO  Oil Base OIL 1 Dose by Does not apply route at bedtime as needed. CBD oil per pt.    [provider]  SUMAtriptan (IMITREX) 100 MG tablet Take 1 tablet (100 mg total) by mouth 2 (two) times daily as needed for migraine. 04/11/18   Ann Held, DO  Vitamin D, Ergocalciferol, (DRISDOL) 50000 units CAPS capsule Take 1 capsule (50,000 Units total) by mouth every 7 (seven) days. 05/16/18   Eber Jones, MD    Family History Family History  Problem Relation Age of Onset  . Hypertension Mother   . Hyperlipidemia Mother   . Obesity Mother   . Stroke Father   . Obesity Father   . Alcoholism Father   . Drug abuse Father   . Diabetes Maternal Uncle   . Heart disease Maternal Grandmother   . Colon cancer Neg Hx     Social History Social History   Tobacco Use  . Smoking status: Former Smoker    Types: Cigarettes  . Smokeless tobacco: Never Used  Substance Use Topics  . Alcohol use: Yes    Alcohol/week: 3.0 standard drinks    Types: 3 Glasses of wine per week  . Drug use: No    Types: Marijuana    Comment: once every 6 months, taking CBD     Allergies   Ibuprofen; Meloxicam; and Metformin and related   Review of Systems Review of Systems  Constitutional: Negative for activity change, appetite change, chills, fatigue and fever.  HENT: Positive for congestion, rhinorrhea, sinus pressure and sore throat. Negative for ear pain and trouble swallowing.   Eyes: Negative for discharge and redness.  Respiratory: Positive for cough and chest tightness. Negative for shortness of breath.   Cardiovascular: Positive for palpitations. Negative for chest pain and leg swelling.  Gastrointestinal: Negative for abdominal pain, diarrhea, nausea and vomiting.  Musculoskeletal: Negative for myalgias.  Skin: Negative for rash.   Neurological: Negative for dizziness, light-headedness and headaches.     Physical Exam Triage Vital Signs ED Triage Vitals [08/11/18 1328]  Enc Vitals Group     BP (!) 142/84     Pulse Rate 92     Resp 18     Temp 98 F (36.7 C)     Temp Source Oral     SpO2 98 %     Weight      Height      Head Circumference  Peak Flow      Pain Score 3     Pain Loc      Pain Edu?      Excl. in [place]?    No data found.  Updated Vital Signs BP (!) 142/84 (BP Location: Right Arm)   Pulse 92   Temp 98 F (36.7 C) (Oral)   Resp 18   SpO2 98%   Visual Acuity Right Eye Distance:   Left Eye Distance:   Bilateral Distance:    Right Eye Near:   Left Eye Near:    Bilateral Near:     Physical Exam Vitals signs and nursing note reviewed.  Constitutional:      General: She is not in acute distress.    Appearance: She is well-developed.  HENT:     Head: Normocephalic and atraumatic.     Ears:     Comments: Bilateral ears without tenderness to palpation of external auricle, tragus and mastoid, EAC's without erythema or swelling, TM's with good bony landmarks and cone of light. Non erythematous.    Nose:     Comments: Nasal mucosa erythematous with swollen turbinates    Mouth/Throat:     Comments: Oral mucosa pink and moist, no tonsillar enlargement or exudate. Posterior pharynx patent and nonerythematous, no uvula deviation or swelling. Normal phonation. Eyes:     Conjunctiva/sclera: Conjunctivae normal.  Neck:     Musculoskeletal: Neck supple.  Cardiovascular:     Rate and Rhythm: Normal rate and regular rhythm.     Heart sounds: No murmur.  Pulmonary:     Effort: Pulmonary effort is normal. No respiratory distress.     Breath sounds: Normal breath sounds.     Comments: Breath Sound slightly decreased likely due to body habitus; no adventitious sounds auscultated, breathing comfortably at rest Abdominal:     Palpations: Abdomen is soft.     Tenderness: There is no abdominal  tenderness.  Musculoskeletal:     Comments: Back tender to palpation throughout trapezius and thoracic musculature, nontender midline  Skin:    General: Skin is warm and dry.  Neurological:     Mental Status: She is alert.      UC Treatments / Results  Labs (all labs ordered are listed, but only abnormal results are displayed) Labs Reviewed - No data to display  EKG None  Radiology No results found.  Procedures Procedures (including critical care time)  Medications Ordered in UC Medications - No data to display  Initial Impression / Assessment and Plan / UC Course  I have reviewed the triage vital signs and the nursing notes.  Pertinent labs & imaging results that were available during my care of the patient were reviewed by me and considered in my medical decision making (see chart for details).     URI symptoms greater than 1 week.  Vital signs stable, exam nonfocal.  Will treat for sinusitis with Augmentin twice daily x1 week.  Will also recommend daily allergy pill as well as Tessalon for congestion and cough.  Continue to monitor for resolution of symptoms.  But drink plenty of fluids.  Follow-up if developing worsening chest discomfort or palpitations.  Chest discomfort and back pain likely muscle/chest wall pain from coughing so much.Discussed strict return precautions. Patient verbalized understanding and is agreeable with plan.  Final Clinical Impressions(s) / UC Diagnoses   Final diagnoses:  Acute sinusitis with symptoms > 10 days     Discharge Instructions     Please begin Augmentin  twice daily for the next week-this will treat for a sinus infection Please begin daily allergy pill, Zyrtec or Claritin daily to help with congestion and drainage May use Tessalon as needed for cough or over-the-counter Delsym Continue to monitor blood pressure Follow-up if symptoms not improving, developing worsening palpitations, chest discomfort, shortness of breath,  fevers   ED Prescriptions    Medication Sig Dispense Auth. Provider   amoxicillin-clavulanate (AUGMENTIN) 875-125 MG tablet Take 1 tablet by mouth every 12 (twelve) hours for 7 days. 14 tablet Mussa Groesbeck C, PA-C   Cetirizine HCl 10 MG CAPS Take 1 capsule (10 mg total) by mouth daily for 10 days. 10 capsule Praneeth Bussey C, PA-C   benzonatate (TESSALON) 200 MG capsule Take 1 capsule (200 mg total) by mouth 3 (three) times daily as needed for up to 7 days for cough. 28 capsule Cohan Stipes C, PA-C     Controlled Substance Prescriptions [place] Controlled Substance Registry consulted? Not Applicable   Janith Lima, [place] 08/11/18 1357

## 2018-08-15 ENCOUNTER — Ambulatory Visit (INDEPENDENT_AMBULATORY_CARE_PROVIDER_SITE_OTHER): Payer: Self-pay | Admitting: Family Medicine

## 2018-08-15 ENCOUNTER — Ambulatory Visit (INDEPENDENT_AMBULATORY_CARE_PROVIDER_SITE_OTHER): Payer: Self-pay | Admitting: Psychology

## 2018-08-15 ENCOUNTER — Encounter (INDEPENDENT_AMBULATORY_CARE_PROVIDER_SITE_OTHER): Payer: Self-pay

## 2018-08-26 ENCOUNTER — Ambulatory Visit (INDEPENDENT_AMBULATORY_CARE_PROVIDER_SITE_OTHER): Payer: BLUE CROSS/BLUE SHIELD | Admitting: Family Medicine

## 2018-08-26 ENCOUNTER — Encounter: Payer: Self-pay | Admitting: Family Medicine

## 2018-08-26 VITALS — BP 128/80 | HR 85 | Temp 98.3°F | Ht 66.0 in | Wt >= 6400 oz

## 2018-08-26 DIAGNOSIS — J4541 Moderate persistent asthma with (acute) exacerbation: Secondary | ICD-10-CM | POA: Diagnosis not present

## 2018-08-26 MED ORDER — BENZONATATE 200 MG PO CAPS: 200.0000 mg | ORAL_CAPSULE | Freq: Two times a day (BID) | ORAL | 0 refills | Status: DC | PRN

## 2018-08-26 MED ORDER — CLARITHROMYCIN ER 500 MG PO TB24: 1000.0000 mg | ORAL_TABLET | Freq: Every day | ORAL | 0 refills | Status: AC

## 2018-08-26 MED ORDER — PREDNISONE 10 MG PO TABS: 10.0000 mg | ORAL_TABLET | Freq: Two times a day (BID) | ORAL | 0 refills | Status: AC

## 2018-08-26 MED ORDER — ALBUTEROL SULFATE HFA 108 (90 BASE) MCG/ACT IN AERS: 1.0000 | INHALATION_SPRAY | Freq: Four times a day (QID) | RESPIRATORY_TRACT | 0 refills | Status: DC | PRN

## 2018-08-26 NOTE — Progress Notes (Signed)
Established Patient Office Visit  Subjective:  Patient ID: Brandy Saunders, female    DOB: 1990-12-18  Age: 27 y.o. MRN: [id number]  CC:  Chief Complaint  Patient presents with  . Cough    x 3 weeks, finished course of Augmentin on 12/23.    HPI Brandy Saunders presents for treatment and evaluation of a 3-week history of intermittent respiratory tract signs and symptoms.  There is been nasal congestion postnasal drip sore throat cough, wheezing and hot and cold spells.  There is been no fever, chills, facial pressure, teeth pain, myalgias or arthralgias.  There has been some itching in her ears and sneezing.  Seen in the ER on the 15th and treated with Augmentin.  She continues to cough with wheeze.  History of childhood asthma that she grew out of by age 17.  Cough is productive of some yellow phlegm.  Past Medical History:  Diagnosis Date  . Anxiety   . Asthma   . Back pain   . Binge eating   . Carpal tunnel syndrome, bilateral   . Chest pain   . Common migraine with intractable migraine 02/02/2017  . Constipation   . Depression   . Dyspnea   . Fatty liver   . GERD (gastroesophageal reflux disease)   . Hypertension   . Joint pain   . Leg edema   . Morbidly obese ([place])   . Palpitations   . PCOS (polycystic ovarian syndrome)   . Prediabetes   . Vitamin D deficiency     Past Surgical History:  Procedure Laterality Date  . TONSILECTOMY, ADENOIDECTOMY, BILATERAL MYRINGOTOMY AND TUBES  1994    Family History  Problem Relation Age of Onset  . Hypertension Mother   . Hyperlipidemia Mother   . Obesity Mother   . Stroke Father   . Obesity Father   . Alcoholism Father   . Drug abuse Father   . Diabetes Maternal Uncle   . Heart disease Maternal Grandmother   . Colon cancer Neg Hx     Social History   Socioeconomic History  . Marital status: Single    Spouse name: n/a  . Number of children: 0  . Years of education: 11  . Highest education level: Not on  file  Occupational History  . Occupation: Public relations account executive  Social Needs  . Financial resource strain: Not on file  . Food insecurity:    Worry: Not on file    Inability: Not on file  . Transportation needs:    Medical: Not on file    Non-medical: Not on file  Tobacco Use  . Smoking status: Former Smoker    Types: Cigarettes  . Smokeless tobacco: Never Used  Substance and Sexual Activity  . Alcohol use: Yes    Alcohol/week: 3.0 standard drinks    Types: 3 Glasses of wine per week  . Drug use: No    Types: Marijuana    Comment: once every 6 months, taking CBD  . Sexual activity: Yes    Partners: Male    Birth control/protection: Condom, Pill    Comment: inconsistent condom use  Lifestyle  . Physical activity:    Days per week: Not on file    Minutes per session: Not on file  . Stress: Not on file  Relationships  . Social connections:    Talks on phone: Not on file    Gets together: Not on file    Attends religious service: Not on  file    Active member of club or organization: Not on file    Attends meetings of clubs or organizations: Not on file    Relationship status: Not on file  . Intimate partner violence:    Fear of current or ex partner: Not on file    Emotionally abused: Not on file    Physically abused: Not on file    Forced sexual activity: Not on file  Other Topics Concern  . Not on file  Social History Narrative   Lives with a roommate.     Parents live in [place], [place].   Right handed   Caffeine use: coffee weekly    Outpatient Medications Prior to Visit  Medication Sig Dispense Refill  . acetaminophen (TYLENOL) 500 MG tablet Take 500 mg by mouth every 6 (six) hours as needed.    . Ascorbic Acid (VITAMIN C) 1000 MG tablet Take 1,000 mg by mouth daily.    Marland Kitchen BIOTIN 5000 PO Take 10,000 mg by mouth daily.     . Cyanocobalamin (VITAMIN B 12 PO) Take 2,500 mcg by mouth daily.    . metFORMIN (GLUCOPHAGE) 500 MG tablet Take 1 tablet (500 mg total) by  mouth daily with breakfast. 90 tablet 0  . metoprolol succinate (TOPROL XL) 50 MG 24 hr tablet Take 2 tab po qd  with or immediately following a meal. 60 tablet 0  . Multiple Vitamins-Calcium (ONE-A-DAY WOMENS PO) Take 1 tablet by mouth daily.     . norethindrone-ethinyl estradiol (BLISOVI FE 1/20) 1-20 MG-MCG tablet Take 1 tablet by mouth daily. 84 tablet 3  . Oil Base OIL 1 Dose by Does not apply route at bedtime as needed. CBD oil per pt.    . SUMAtriptan (IMITREX) 100 MG tablet Take 1 tablet (100 mg total) by mouth 2 (two) times daily as needed for migraine. 60 tablet 0  . Vitamin D, Ergocalciferol, (DRISDOL) 50000 units CAPS capsule Take 1 capsule (50,000 Units total) by mouth every 7 (seven) days. 4 capsule 0  . Cetirizine HCl 10 MG CAPS Take 1 capsule (10 mg total) by mouth daily for 10 days. 10 capsule 0   No facility-administered medications prior to visit.     Allergies  Allergen Reactions  . Ibuprofen Diarrhea  . Meloxicam     Pt stated, "It made my emotions out of wack; it made my left hand numb"    ROS Review of Systems  Constitutional: Negative for chills, diaphoresis, fatigue, fever and unexpected weight change.  HENT: Positive for congestion, hearing loss, sneezing and sore throat. Negative for ear discharge, ear pain, sinus pressure, sinus pain and voice change.   Eyes: Negative for photophobia and visual disturbance.  Respiratory: Positive for cough and wheezing. Negative for shortness of breath.   Cardiovascular: Negative.   Gastrointestinal: Negative.   Endocrine: Negative for polyphagia and polyuria.  Genitourinary: Negative.   Musculoskeletal: Negative for arthralgias and myalgias.  Skin: Negative for pallor and rash.  Allergic/Immunologic: Negative for immunocompromised state.  Neurological: Negative for speech difficulty and headaches.  Hematological: Does not bruise/bleed easily.  Psychiatric/Behavioral: Negative.       Objective:    Physical Exam    Constitutional: She is oriented to person, place, and time. She appears well-developed. No distress.  HENT:  Head: Normocephalic and atraumatic.  Right Ear: External ear normal.  Left Ear: External ear normal.  Mouth/Throat: Oropharynx is clear and moist. No oropharyngeal exudate.  Eyes: Pupils are equal, round, and reactive to  light. Conjunctivae are normal. Right eye exhibits no discharge. Left eye exhibits no discharge. No scleral icterus.  Neck: Neck supple. No JVD present. No tracheal deviation present. No thyromegaly present.  Cardiovascular: Normal rate, regular rhythm and normal heart sounds.  Pulmonary/Chest: Effort normal and breath sounds normal. No stridor. She has no decreased breath sounds. She has no wheezes. She has no rhonchi. She has no rales.  Abdominal: Bowel sounds are normal.  Lymphadenopathy:    She has no cervical adenopathy.  Neurological: She is alert and oriented to person, place, and time.  Skin: Skin is warm and dry. She is not diaphoretic.  Psychiatric: She has a normal mood and affect. Her behavior is normal.    BP 128/80   Pulse 85   Temp 98.3 F (36.8 C) (Oral)   Ht 5\' 6"  (1.676 m)   Wt (!) 432 lb (196 kg)   SpO2 98%   BMI 69.73 kg/m  Wt Readings from Last 3 Encounters:  08/26/18 (!) 432 lb (196 kg)  07/23/18 (!) 417 lb (189.1 kg)  07/04/18 (!) 419 lb (190.1 kg)   BP Readings from Last 3 Encounters:  08/26/18 128/80  08/11/18 (!) 142/84  07/23/18 119/82   Guideline developer:  UpToDate (see UpToDate for funding source) Date Released: June 2014  Health Maintenance Due  Topic Date Due  . Samul Dada  07/30/2018    There are no preventive care reminders to display for this patient.  Lab Results  Component Value Date   TSH 2.18 05/07/2018   Lab Results  Component Value Date   WBC 7.2 05/07/2018   HGB 13.9 05/07/2018   HCT 41.2 05/07/2018   MCV 83.5 05/07/2018   PLT 272.0 05/07/2018   Lab Results  Component Value Date   NA  137 05/07/2018   K 4.6 05/07/2018   CO2 28 05/07/2018   GLUCOSE 95 05/07/2018   BUN 18 05/07/2018   CREATININE 0.70 05/07/2018   BILITOT 0.4 05/07/2018   ALKPHOS 53 05/07/2018   AST 13 05/07/2018   ALT 19 05/07/2018   PROT 7.0 05/07/2018   ALBUMIN 4.1 05/07/2018   CALCIUM 9.3 05/07/2018   ANIONGAP 9 02/22/2017   GFR 128.05 05/07/2018   Lab Results  Component Value Date   CHOL 151 06/14/2018   Lab Results  Component Value Date   HDL 55.60 06/14/2018   Lab Results  Component Value Date   LDLCALC 81 06/14/2018   Lab Results  Component Value Date   TRIG 70.0 06/14/2018   Lab Results  Component Value Date   CHOLHDL 3 06/14/2018   Lab Results  Component Value Date   HGBA1C 5.8 (H) 05/02/2018      Assessment & Plan:   Problem List Items Addressed This Visit    None    Visit Diagnoses    Moderate persistent asthmatic bronchitis with acute exacerbation    -  Primary   Relevant Medications   clarithromycin (BIAXIN XL) 500 MG 24 hr tablet   predniSONE (DELTASONE) 10 MG tablet   albuterol (PROVENTIL HFA;VENTOLIN HFA) 108 (90 Base) MCG/ACT inhaler   benzonatate (TESSALON) 200 MG capsule      Meds ordered this encounter  Medications  . clarithromycin (BIAXIN XL) 500 MG 24 hr tablet    Sig: Take 2 tablets (1,000 mg total) by mouth daily for 10 days.    Dispense:  20 tablet    Refill:  0  . predniSONE (DELTASONE) 10 MG tablet    Sig: Take  1 tablet (10 mg total) by mouth 2 (two) times daily with a meal for 5 days.    Dispense:  10 tablet    Refill:  0  . albuterol (PROVENTIL HFA;VENTOLIN HFA) 108 (90 Base) MCG/ACT inhaler    Sig: Inhale 1-2 puffs into the lungs every 6 (six) hours as needed for wheezing or shortness of breath. And or cough.    Dispense:  1 Inhaler    Refill:  0    Please instruct.  . benzonatate (TESSALON) 200 MG capsule    Sig: Take 1 capsule (200 mg total) by mouth 2 (two) times daily as needed for cough.    Dispense:  20 capsule    Refill:   0    Follow-up: Return Follow up with Dr. Edilia Bo. .   Discussed inhaler use with patient and suggested that she use it for tightness wheezing and cough.  Low-dose prednisone for 5 days.  Biaxin to cover for atypicals.  Patient given information on Biaxin and also how to use an inhaler.  Asked pharmacy to shower as well.

## 2018-08-26 NOTE — Patient Instructions (Addendum)
How to Use a Metered Dose Inhaler A metered dose inhaler is a handheld device for taking medicine that must be breathed into the lungs (inhaled). The device can be used to deliver a variety of inhaled medicines, including:  Quick relief or rescue medicines, such as bronchodilators.  Controller medicines, such as corticosteroids. The medicine is delivered by pushing down on a metal canister to release a preset amount of spray and medicine. Each device contains the amount of medicine that is needed for a preset number of uses (inhalations). Your health care provider may recommend that you use a spacer with your inhaler to help you take the medicine more effectively. A spacer is a plastic tube with a mouthpiece on one end and an opening that connects to the inhaler on the other end. A spacer holds the medicine in a tube for a short time, which allows you to inhale more medicine. What are the risks? If you do not use your inhaler correctly, medicine might not reach your lungs to help you breathe. Inhaler medicine can cause side effects, such as:  Mouth or throat infection.  Cough.  Hoarseness.  Headache.  Nausea and vomiting.  Lung infection (pneumonia) in people who have a lung condition called COPD. How to use a metered dose inhaler without a spacer  1. Remove the cap from the inhaler. 2. If you are using the inhaler for the first time, shake it for 5 seconds, turn it away from your face, then release 4 puffs into the air. This is called priming. 3. Shake the inhaler for 5 seconds. 4. Position the inhaler so the top of the canister faces up. 5. Put your index finger on the top of the medicine canister. Support the bottom of the inhaler with your thumb. 6. Breathe out normally and as completely as possible, away from the inhaler. 7. Either place the inhaler between your teeth and close your lips tightly around the mouthpiece, or hold the inhaler 1-2 inches (2.5-5 cm) away from your open  mouth. Keep your tongue down out of the way. If you are unsure which technique to use, ask your health care provider. 8. Press the canister down with your index finger to release the medicine, then inhale deeply and slowly through your mouth (not your nose) until your lungs are completely filled. Inhaling should take 4-6 seconds. 9. Hold the medicine in your lungs for 5-10 seconds (10 seconds is best). This helps the medicine get into the small airways of your lungs. 10. With your lips in a tight circle (pursed), breathe out slowly. 11. Repeat steps 3-10 until you have taken the number of puffs that your health care provider directed. Wait about 1 minute between puffs or as directed. 12. Put the cap on the inhaler. 13. If you are using a steroid inhaler, rinse your mouth with water, gargle, and spit out the water. Do not swallow the water. How to use a metered dose inhaler with a spacer  1. Remove the cap from the inhaler. 2. If you are using the inhaler for the first time, shake it for 5 seconds, turn it away from your face, then release 4 puffs into the air. This is called priming. 3. Shake the inhaler for 5 seconds. 4. Place the open end of the spacer onto the inhaler mouthpiece. 5. Position the inhaler so the top of the canister faces up and the spacer mouthpiece faces you. 6. Put your index finger on the top of the medicine canister.   Support the bottom of the inhaler and the spacer with your thumb. 7. Breathe out normally and as completely as possible, away from the spacer. 8. Place the spacer between your teeth and close your lips tightly around it. Keep your tongue down out of the way. 9. Press the canister down with your index finger to release the medicine, then inhale deeply and slowly through your mouth (not your nose) until your lungs are completely filled. Inhaling should take 4-6 seconds. 10. Hold the medicine in your lungs for 5-10 seconds (10 seconds is best). This helps the  medicine get into the small airways of your lungs. 11. With your lips in a tight circle (pursed), breathe out slowly. 12. Repeat steps 3-11 until you have taken the number of puffs that your health care provider directed. Wait about 1 minute between puffs or as directed. 13. Remove the spacer from the inhaler and put the cap on the inhaler. 14. If you are using a steroid inhaler, rinse your mouth with water, gargle, and spit out the water. Do not swallow the water. Follow these instructions at home:  Take your inhaled medicine only as told by your health care provider. Do not use the inhaler more than directed by your health care provider.  Keep all follow-up visits as told by your health care provider. This is important.  If your inhaler has a counter, you can check it to determine how full your inhaler is. If your inhaler does not have a counter, ask your health care provider when you will need to refill your inhaler and write the refill date on a calendar or on your inhaler canister. Note that you cannot know when an inhaler is empty by shaking it.  Follow directions on the package insert for care and cleaning of your inhaler and spacer. Contact a health care provider if:  Symptoms are only partially relieved with your inhaler.  You are having trouble using your inhaler.  You have an increase in phlegm.  You have headaches. Get help right away if:  You feel little or no relief after using your inhaler.  You have dizziness.  You have a fast heart rate.  You have chills or a fever.  You have night sweats.  There is blood in your phlegm. Summary  A metered dose inhaler is a handheld device for taking medicine that must be breathed into the lungs (inhaled).  The medicine is delivered by pushing down on a metal canister to release a preset amount of spray and medicine.  Each device contains the amount of medicine that is needed for a preset number of uses (inhalations). This  information is not intended to replace advice given to you by your health care provider. Make sure you discuss any questions you have with your health care provider. Document Released: 08/14/2005 Document Revised: 03/05/2017 Document Reviewed: 07/04/2016 Elsevier Interactive Patient Education  2019 Elsevier Inc.  Cough, Adult  Coughing is a reflex that clears your throat and your airways. Coughing helps to heal and protect your lungs. It is normal to cough occasionally, but a cough that happens with other symptoms or lasts a long time may be a sign of a condition that needs treatment. A cough may last only 2-3 weeks (acute), or it may last longer than 8 weeks (chronic). What are the causes? Coughing is commonly caused by:  Breathing in substances that irritate your lungs.  A viral or bacterial respiratory infection.  Allergies.  Asthma.  Postnasal drip.  Smoking.  Acid backing up from the stomach into the esophagus (gastroesophageal reflux).  Certain medicines.  Chronic lung problems, including COPD (or rarely, lung cancer).  Other medical conditions such as heart failure. Follow these instructions at home: Pay attention to any changes in your symptoms. Take these actions to help with your discomfort:  Take medicines only as told by your health care provider. ? If you were prescribed an antibiotic medicine, take it as told by your health care provider. Do not stop taking the antibiotic even if you start to feel better. ? Talk with your health care provider before you take a cough suppressant medicine.  Drink enough fluid to keep your urine clear or pale yellow.  If the air is dry, use a cold steam vaporizer or humidifier in your bedroom or your home to help loosen secretions.  Avoid anything that causes you to cough at work or at home.  If your cough is worse at night, try sleeping in a semi-upright position.  Avoid cigarette smoke. If you smoke, quit smoking. If you need  help quitting, ask your health care provider.  Avoid caffeine.  Avoid alcohol.  Rest as needed. Contact a health care provider if:  You have new symptoms.  You cough up pus.  Your cough does not get better after 2-3 weeks, or your cough gets worse.  You cannot control your cough with suppressant medicines and you are losing sleep.  You develop pain that is getting worse or pain that is not controlled with pain medicines.  You have a fever.  You have unexplained weight loss.  You have night sweats. Get help right away if:  You cough up blood.  You have difficulty breathing.  Your heartbeat is very fast. This information is not intended to replace advice given to you by your health care provider. Make sure you discuss any questions you have with your health care provider. Document Released: 02/10/2011 Document Revised: 01/20/2016 Document Reviewed: 10/21/2014 Elsevier Interactive Patient Education  2019 Elsevier Inc. Clarithromycin extended-release tablets What is this medicine? CLARITHROMYCIN (kla RITH roe mye sin) is a macrolide antibiotic. It is used to treat or prevent certain kinds of bacterial infections. It will not work for colds, flu, or other viral infections. This medicine may be used for other purposes; ask your health care provider or pharmacist if you have questions. COMMON BRAND NAME(S): Biaxin XL What should I tell my health care provider before I take this medicine? They need to know if you have any of these conditions: -heart disease -history of irregular heartbeat -kidney disease -liver disease -myasthenia gravis -an unusual or allergic reaction to clarithromycin, other macrolide antibiotics, other medicines, foods, dyes, or preservatives -pregnant or trying to get pregnant -breast-feeding How should I use this medicine? Take this medicine by mouth with food. Swallow the tablets whole, do not break, chew, or crush. Follow the directions on the  prescription label. Take your medicine at regular intervals. Do not take your medicine more often than directed. Take all of your medicine as directed even if you think your are better. Do not skip doses or stop your medicine early. Talk to your pediatrician regarding the use of this medicine in children. Special care may be needed. Overdosage: If you think you have taken too much of this medicine contact a poison control center or emergency room at once. NOTE: This medicine is only for you. Do not share this medicine with others. What if I miss a dose? If you  miss a dose, take it as soon as you can. If it is almost time for your next dose, take only that dose. Do not take double or extra doses. What may interact with this medicine? Do not take this medicine with any of the following medications: -certain medicines for fungal infections like fluconazole, itraconazole, ketoconazole, posaconazole, voriconazole -cisapride -dofetilide -dronedarone -naloxegol -pimozide -thioridazine -ziprasidone This medicine may also interact with the following medications: -birth control pills -carbamazepine -certain medicines for anxiety or sleep like alprazolam or triazolam -certain medicines for cholesterol like atorvastatin, lovastatin, simvastatin -certain medicines for irregular heart beat like amiodarone, disopyramide, flecainide, procainamide, quinidine -certain medicines that treat or prevent clots like warfarin -colchicine -cyclosporine -digoxin -ergot alkaloids like ergotamine, dihydroergotamine -other antibiotics like grepafloxacin, rifabutin, sparfloxacin -other medicines that prolong the QT interval (cause an abnormal heart rhythm) -ritonavir -sildenafil -terfenadine -theophylline -zidovudine This list may not describe all possible interactions. Give your health care provider a list of all the medicines, herbs, non-prescription drugs, or dietary supplements you use. Also tell them if you  smoke, drink alcohol, or use illegal drugs. Some items may interact with your medicine. What should I watch for while using this medicine? Tell your doctor or health care professional if your symptoms do not improve. Do not treat diarrhea with over the counter products. Contact your doctor if you have diarrhea that lasts more than 2 days or if it is severe and watery. If you have diabetes, monitor your blood sugar carefully while on this medicine. What side effects may I notice from receiving this medicine? Side effects that you should report to your doctor or health care professional as soon as possible: -allergic reactions like skin rash, itching or hives, swelling of the face, lips, or tongue -irregular heartbeat or chest pain -pain or difficulty passing urine -redness, blistering, peeling or loosening of the skin, including inside the mouth -yellowing of the eyes or skin Side effects that usually do not require medical attention (report to your doctor or health care professional if they continue or are bothersome): -abnormal taste -anxiety, confusion, or nightmares -diarrhea -headache -intestinal gas -stomach upset or nausea This list may not describe all possible side effects. Call your doctor for medical advice about side effects. You may report side effects to FDA at 1-800-FDA-1088. Where should I keep my medicine? Keep out of the reach of children. Store at room temperature between 15 and 30 degrees C (59 and 86 degrees F). Keep container tightly closed. Protect from light. Throw away any unused medicine after the expiration date. NOTE: This sheet is a summary. It may not cover all possible information. If you have questions about this medicine, talk to your doctor, pharmacist, or health care provider.  2019 Elsevier/Gold Standard (2017-03-28 10:47:12)

## 2018-08-29 ENCOUNTER — Encounter: Payer: Self-pay | Admitting: Family Medicine

## 2018-08-29 ENCOUNTER — Other Ambulatory Visit: Payer: Self-pay | Admitting: Family Medicine

## 2018-08-29 DIAGNOSIS — I10 Essential (primary) hypertension: Secondary | ICD-10-CM

## 2018-08-29 MED ORDER — METOPROLOL SUCCINATE ER 100 MG PO TB24: 100.0000 mg | ORAL_TABLET | Freq: Every day | ORAL | 3 refills | Status: DC

## 2018-08-29 NOTE — Telephone Encounter (Signed)
I sent it in 

## 2018-08-30 ENCOUNTER — Encounter: Payer: Self-pay | Admitting: Cardiovascular Disease

## 2018-08-30 ENCOUNTER — Ambulatory Visit (INDEPENDENT_AMBULATORY_CARE_PROVIDER_SITE_OTHER): Payer: BLUE CROSS/BLUE SHIELD | Admitting: Cardiovascular Disease

## 2018-08-30 VITALS — BP 128/88 | HR 75 | Wt >= 6400 oz

## 2018-08-30 DIAGNOSIS — G4733 Obstructive sleep apnea (adult) (pediatric): Secondary | ICD-10-CM | POA: Diagnosis not present

## 2018-08-30 DIAGNOSIS — R002 Palpitations: Secondary | ICD-10-CM | POA: Diagnosis not present

## 2018-08-30 NOTE — Assessment & Plan Note (Signed)
[name] has symptoms of obstructive sleep apnea with nocturnal snoring, apnea, and daytime somnolence.  She does have palpitations which sleep apnea may be contributory to.  We will obtain an outpatient sleep study to further evaluate.

## 2018-08-30 NOTE — Patient Instructions (Signed)
Medication Instructions:  NO CHANGE If you need a refill on your cardiac medications before your next appointment, please call your pharmacy.   Lab work: NONE If you have labs (blood work) drawn today and your tests are completely normal, you will receive your results only by: Marland Kitchen MyChart Message (if you have MyChart) OR . A paper copy in the mail If you have any lab test that is abnormal or we need to change your treatment, we will call you to review the results.  Testing/Procedures: Your physician has recommended that you have a sleep study. This test records several body functions during sleep, including: brain activity, eye movement, oxygen and carbon dioxide blood levels, heart rate and rhythm, breathing rate and rhythm, the flow of air through your mouth and nose, snoring, body muscle movements, and chest and belly movement.   Follow-Up: At [facility], you and your health needs are our priority.  As part of our continuing mission to provide you with exceptional heart care, we have created designated Provider Care Teams.  These Care Teams include your primary Cardiologist (physician) and Advanced Practice Providers (APPs -  Physician Assistants and Nurse Practitioners) who all work together to provide you with the care you need, when you need it. . You will need a follow up appointment in 3 months.  Please call our office 2 months in advance to schedule this appointment.  You may see Dr. Gwenlyn Found or one of the following Advanced Practice Providers on your designated Care Team:   . Kerin Ransom, [place] . Almyra Deforest, PA-C . Fabian Sharp, PA-C . Jory Sims, DNP . Rosaria Ferries, PA-C . Roby Lofts, PA-C . Sande Rives, PA-C  Any Other Special Instructions Will Be Listed Below (If Applicable).

## 2018-08-30 NOTE — Assessment & Plan Note (Signed)
History of essential hypertension with blood pressure measured today 120/88.  She is on metoprolol.

## 2018-08-30 NOTE — Progress Notes (Signed)
08/30/2018 Brandy Saunders   1991-08-06  [id number]  Primary Physician Ann Held, DO Primary Cardiologist: Lorretta Harp MD Garret Reddish, [place], [place]  HPI:  Brandy Saunders is a 28 y.o. morbidly overweight single African-American female with no children who was referred by Dr. Carollee Herter for cardiovascular valuation because of palpitations.  She works as a Dance movement psychotherapist for [employer].  She does not smoke.  She does have treated hypertension.  She is not diabetic or hyperlipidemic.  There is no family history for heart disease.  She is never had a heart attack or stroke and denies chest pain or shortness of breath.  She does have PCO S.  She drinks wine 3 to 4 days a week.  She also has symptoms compatible with obstructive sleep apnea including including nocturnal snoring and apnea and daytime somnolence.  She wishes to lose weight and is seen providers at the local obesity clinic.  She is noticed new onset palpitations the last several months occurring on a daily basis for weeks at a time.  She has never lost consciousness although she is occasionally gotten dizzy.  Thyroid function test have been normal.  30-day event monitor was normal as well and a 2D echo showed normal heart structurally.   Current Meds  Medication Sig  . acetaminophen (TYLENOL) 500 MG tablet Take 500 mg by mouth every 6 (six) hours as needed.  Marland Kitchen albuterol (PROVENTIL HFA;VENTOLIN HFA) 108 (90 Base) MCG/ACT inhaler Inhale 1-2 puffs into the lungs every 6 (six) hours as needed for wheezing or shortness of breath. And or cough.  . Ascorbic Acid (VITAMIN C) 1000 MG tablet Take 1,000 mg by mouth daily.  . benzonatate (TESSALON) 200 MG capsule Take 1 capsule (200 mg total) by mouth 2 (two) times daily as needed for cough.  Marland Kitchen BIOTIN 5000 PO Take 10,000 mg by mouth daily.   . Cetirizine HCl 10 MG CAPS Take 1 capsule (10 mg total) by mouth daily for 10 days.  . clarithromycin (BIAXIN XL) 500 MG  24 hr tablet Take 2 tablets (1,000 mg total) by mouth daily for 10 days.  . Cyanocobalamin (VITAMIN B 12 PO) Take 2,500 mcg by mouth daily.  . metFORMIN (GLUCOPHAGE) 500 MG tablet Take 1 tablet (500 mg total) by mouth daily with breakfast.  . metoprolol succinate (TOPROL-XL) 100 MG 24 hr tablet Take 1 tablet (100 mg total) by mouth daily. Take with or immediately following a meal.  . Multiple Vitamins-Calcium (ONE-A-DAY WOMENS PO) Take 1 tablet by mouth daily.   . norethindrone-ethinyl estradiol (BLISOVI FE 1/20) 1-20 MG-MCG tablet Take 1 tablet by mouth daily.  . Oil Base OIL 1 Dose by Does not apply route at bedtime as needed. CBD oil per pt.  . predniSONE (DELTASONE) 10 MG tablet Take 1 tablet (10 mg total) by mouth 2 (two) times daily with a meal for 5 days.  . SUMAtriptan (IMITREX) 100 MG tablet Take 1 tablet (100 mg total) by mouth 2 (two) times daily as needed for migraine.  . Vitamin D, Ergocalciferol, (DRISDOL) 50000 units CAPS capsule Take 1 capsule (50,000 Units total) by mouth every 7 (seven) days.     Allergies  Allergen Reactions  . Ibuprofen Diarrhea  . Meloxicam     Pt stated, "It made my emotions out of wack; it made my left hand numb"    Social History   Socioeconomic History  . Marital status: Single  Spouse name: n/a  . Number of children: 0  . Years of education: 66  . Highest education level: Not on file  Occupational History  . Occupation: Public relations account executive  Social Needs  . Financial resource strain: Not on file  . Food insecurity:    Worry: Not on file    Inability: Not on file  . Transportation needs:    Medical: Not on file    Non-medical: Not on file  Tobacco Use  . Smoking status: Former Smoker    Types: Cigarettes  . Smokeless tobacco: Never Used  Substance and Sexual Activity  . Alcohol use: Yes    Alcohol/week: 3.0 standard drinks    Types: 3 Glasses of wine per week  . Drug use: No    Types: Marijuana    Comment: once every 6 months,  taking CBD  . Sexual activity: Yes    Partners: Male    Birth control/protection: Condom, Pill    Comment: inconsistent condom use  Lifestyle  . Physical activity:    Days per week: Not on file    Minutes per session: Not on file  . Stress: Not on file  Relationships  . Social connections:    Talks on phone: Not on file    Gets together: Not on file    Attends religious service: Not on file    Active member of club or organization: Not on file    Attends meetings of clubs or organizations: Not on file    Relationship status: Not on file  . Intimate partner violence:    Fear of current or ex partner: Not on file    Emotionally abused: Not on file    Physically abused: Not on file    Forced sexual activity: Not on file  Other Topics Concern  . Not on file  Social History Narrative   Lives with a roommate.     Parents live in [place], [place].   Right handed   Caffeine use: coffee weekly     Review of Systems: General: negative for chills, fever, night sweats or weight changes.  Cardiovascular: negative for chest pain, dyspnea on exertion, edema, orthopnea, palpitations, paroxysmal nocturnal dyspnea or shortness of breath Dermatological: negative for rash Respiratory: negative for cough or wheezing Urologic: negative for hematuria Abdominal: negative for nausea, vomiting, diarrhea, bright red blood per rectum, melena, or hematemesis Neurologic: negative for visual changes, syncope, or dizziness All other systems reviewed and are otherwise negative except as noted above.    Blood pressure 128/88, pulse 75, weight (!) 437 lb 9.6 oz (198.5 kg).  General appearance: alert and no distress Neck: no adenopathy, no carotid bruit, no JVD, supple, symmetrical, trachea midline and thyroid not enlarged, symmetric, no tenderness/mass/nodules Lungs: clear to auscultation bilaterally Heart: regular rate and rhythm, S1, S2 normal, no murmur, click, rub or gallop Extremities: extremities  normal, atraumatic, no cyanosis or edema Pulses: 2+ and symmetric Skin: Skin color, texture, turgor normal. No rashes or lesions Neurologic: Alert and oriented X 3, normal strength and tone. Normal symmetric reflexes. Normal coordination and gait  EKG sinus rhythm at 75 without ST or T wave changes.  Personally reviewed this EKG.  ASSESSMENT AND PLAN:   Essential hypertension History of essential hypertension with blood pressure measured today 120/88.  She is on metoprolol.  Palpitations Several month history of palpitations that occur on a daily basis for weeks at a time and then resolve spontaneously only to recur again.  She has had  a one-month event monitor performed 05/23/2018 which was unrevealing and a 2D echo that was normal.  TSH was normal as well.  She does drink 12 ounces of coffee a day and has symptoms compatible with obstructive sleep apnea.  At this point, the next diagnostic modality would be insertion of a loop recorder which she is hesitant to pursue at this time.  She is interested in weight loss and dietary modification.  Talked about reduction in caffeine intake as well.  Obstructive sleep apnea Ms. Megan Salon has symptoms of obstructive sleep apnea with nocturnal snoring, apnea, and daytime somnolence.  She does have palpitations which sleep apnea may be contributory to.  We will obtain an outpatient sleep study to further evaluate.      Lorretta Harp MD FACP,FACC,FAHA, [facility] 08/30/2018 9:28 AM

## 2018-08-30 NOTE — Assessment & Plan Note (Signed)
Several month history of palpitations that occur on a daily basis for weeks at a time and then resolve spontaneously only to recur again.  She has had a one-month event monitor performed 05/23/2018 which was unrevealing and a 2D echo that was normal.  TSH was normal as well.  She does drink 12 ounces of coffee a day and has symptoms compatible with obstructive sleep apnea.  At this point, the next diagnostic modality would be insertion of a loop recorder which she is hesitant to pursue at this time.  She is interested in weight loss and dietary modification.  Talked about reduction in caffeine intake as well.

## 2018-09-04 ENCOUNTER — Telehealth: Payer: Self-pay | Admitting: *Deleted

## 2018-09-04 NOTE — Telephone Encounter (Signed)
Patient notified of sleep study appointment scheduled 10/01/18 @ [facility].

## 2018-10-01 ENCOUNTER — Encounter (HOSPITAL_BASED_OUTPATIENT_CLINIC_OR_DEPARTMENT_OTHER): Payer: Self-pay

## 2018-10-21 ENCOUNTER — Ambulatory Visit: Payer: Self-pay | Admitting: Cardiovascular Disease

## 2018-11-06 ENCOUNTER — Ambulatory Visit (HOSPITAL_BASED_OUTPATIENT_CLINIC_OR_DEPARTMENT_OTHER): Payer: BLUE CROSS/BLUE SHIELD | Attending: Cardiovascular Disease | Admitting: Cardiovascular Disease

## 2018-11-06 VITALS — Ht 66.0 in | Wt >= 6400 oz

## 2018-11-06 DIAGNOSIS — G4733 Obstructive sleep apnea (adult) (pediatric): Secondary | ICD-10-CM | POA: Diagnosis not present

## 2018-11-10 ENCOUNTER — Encounter (HOSPITAL_BASED_OUTPATIENT_CLINIC_OR_DEPARTMENT_OTHER): Payer: Self-pay | Admitting: Cardiovascular Disease

## 2018-11-10 NOTE — Procedures (Signed)
  Patient Name: Brandy Saunders, Brandy Saunders Study Date: 11/06/2018 Gender: Female D.O.B: 01/17/1991 Age (years): 27 Referring Provider: Jonathan J Berry Height (inches): 65 Interpreting Physician: Thomas Kelly MD, ABSM Weight (lbs): 430 RPSGT: Spruill, Vicki BMI: 72 MRN: [id number] Neck Size: 18.00  CLINICAL INFORMATION Sleep Study Type: Split Night CPAP  Indication for sleep study: Fatigue, Hypertension, Obesity, OSA, Snoring  Epworth Sleepiness Score: 12  SLEEP STUDY TECHNIQUE As per the AASM Manual for the Scoring of Sleep and Associated Events v2.3 (April 2016) with a hypopnea requiring 4% desaturations.  The channels recorded and monitored were frontal, central and occipital EEG, electrooculogram (EOG), submentalis EMG (chin), nasal and oral airflow, thoracic and abdominal wall motion, anterior tibialis EMG, snore microphone, electrocardiogram, and pulse oximetry. Continuous positive airway pressure (CPAP) was initiated when the patient met split night criteria and was titrated according to treat sleep-disordered breathing.  MEDICATIONS  acetaminophen (TYLENOL) 500 MG tablet    albuterol (PROVENTIL HFA;VENTOLIN HFA) 108 (90 Base) MCG/ACT inhaler    Ascorbic Acid (VITAMIN C) 1000 MG tablet    benzonatate (TESSALON) 200 MG capsule    BIOTIN 5000 PO    Cetirizine HCl 10 MG CAPS(Expired)    Cyanocobalamin (VITAMIN B 12 PO)    metFORMIN (GLUCOPHAGE) 500 MG tablet    metoprolol succinate (TOPROL-XL) 100 MG 24 hr tablet    Multiple Vitamins-Calcium (ONE-A-DAY WOMENS PO)    norethindrone-ethinyl estradiol (BLISOVI FE 1/20) 1-20 MG-MCG tablet    Oil Base OIL    SUMAtriptan (IMITREX) 100 MG tablet    Vitamin D, Ergocalciferol, (DRISDOL) 50000 units CAPS capsule    Medications self-administered by patient taken the night of the study : N/A  RESPIRATORY PARAMETERS Diagnostic Total AHI (/hr): 99.8 RDI (/hr): 106.3 OA Index (/hr): 2.3 CA Index (/hr): 0.0 REM AHI (/hr): 120.0 NREM  AHI (/hr): 99.8 Supine AHI (/hr): 90.1 Non-supine AHI (/hr): 100.5 Min O2 Sat (%): 84.0 Mean O2 (%): 95.3 Time below 88% (min): 0.9   Titration Optimal Pressure (cm): 16 AHI at Optimal Pressure (/hr): 0.0 Min O2 at Optimal Pressure (%): 96.0 Supine % at Optimal (%): 0 Sleep % at Optimal (%): 13   SLEEP ARCHITECTURE The recording time for the entire night was 365 minutes.  During a baseline period of 182.6 minutes, the patient slept for 131.0 minutes in REM and nonREM, yielding a sleep efficiency of 71.8%%. Sleep onset after lights out was 21.3 minutes with a REM latency of 103.0 minutes. The patient spent 24.8%% of the night in stage N1 sleep, 74.8%% in stage N2 sleep, 0.0%% in stage N3 and 0.4% in REM.  During the titration period of 175.4 minutes, the patient slept for 59.5 minutes in REM and nonREM, yielding a sleep efficiency of 33.9%%. Sleep onset after CPAP initiation was 43.3 minutes with a REM latency of N/A minutes. The patient spent 67.2%% of the night in stage N1 sleep, 32.8%% in stage N2 sleep, 0.0%% in stage N3 and 0% in REM.  CARDIAC DATA The 2 lead EKG demonstrated sinus rhythm. The mean heart rate was 100.0 beats per minute. Other EKG findings include: PVCs.  LEG MOVEMENT DATA The total Periodic Limb Movements of Sleep (PLMS) were 0. The PLMS index was 0.0 .  IMPRESSIONS - Severe obstructive sleep apnea occurred during the diagnostic portion of the study (AHI 99.8/h; RDI 106.3/h). CPAP was initiated at 6 cm and was titrated to 16 cm water. AHI at 16 cm was 0, but RDI 71.5/h; 02 nadir 96%. - No   significant central sleep apnea occurred during the diagnostic portion of the study (CAI = 0.0/hour). - Abnormal sleep architecture with absence of slow wave and insignificant REM sleep. - Oxygen desaturation during the diagnostic portion to a nadir of 84%. - The patient snored with moderate snoring volume during the diagnostic portion of the study. - EKG findings include PVCs. -  Clinically significant periodic limb movements did not occur during sleep.  DIAGNOSIS - Obstructive Sleep Apnea (327.23 [G47.33 ICD-10])  RECOMMENDATIONS - Recommend an initial trial of CPAP Auto therapy with EPR of 3 at 16 - 20 cm H2O with heated humidification.  A Medium size Resmed Full Face Mask AirFit F30 mask was used for the titration.  - Effort should be made to optimize nasal and oropharyngeal patency.  - Avoid alcohol, sedatives and other CNS depressants that may worsen sleep apnea and disrupt normal sleep architecture. - Sleep hygiene should be reviewed to assess factors that may improve sleep quality. - Weight management and regular exercise should be initiated or continued. - Recommend a download in 30 days and sleep clinic evaluation after 4 weeks of therapy  [Electronically signed] 11/10/2018 10:39 AM  Shelva Majestic MD, [facility], ABSM Diplomate, American Board of Sleep Medicine   NPI: [id number] [place] PH: [phone]   FX: [phone] [place]

## 2018-11-14 ENCOUNTER — Telehealth: Payer: Self-pay | Admitting: *Deleted

## 2018-11-14 NOTE — Telephone Encounter (Signed)
Patient notified of sleep study results and recommendations. All questions appeared to be answered to her satisfaction. CPAP referral sent to [facility].

## 2018-11-14 NOTE — Telephone Encounter (Signed)
-----   Message from Troy Sine, MD sent at 11/10/2018 10:43 AM EDT ----- Mariann Laster, please notify pt and set up with DME for CPAP

## 2018-11-29 ENCOUNTER — Ambulatory Visit: Payer: BLUE CROSS/BLUE SHIELD | Admitting: Cardiovascular Disease

## 2018-12-03 ENCOUNTER — Emergency Department (HOSPITAL_BASED_OUTPATIENT_CLINIC_OR_DEPARTMENT_OTHER)
Admission: EM | Admit: 2018-12-03 | Discharge: 2018-12-03 | Disposition: A | Discharge status: Home / Self Care | Payer: Self-pay | Attending: Emergency Medicine | Admitting: Emergency Medicine

## 2018-12-03 ENCOUNTER — Ambulatory Visit: Payer: Self-pay | Admitting: *Deleted

## 2018-12-03 ENCOUNTER — Other Ambulatory Visit: Payer: Self-pay

## 2018-12-03 ENCOUNTER — Telehealth: Payer: BLUE CROSS/BLUE SHIELD | Admitting: Physician Assistant

## 2018-12-03 ENCOUNTER — Emergency Department (HOSPITAL_BASED_OUTPATIENT_CLINIC_OR_DEPARTMENT_OTHER): Payer: Self-pay

## 2018-12-03 ENCOUNTER — Encounter (HOSPITAL_BASED_OUTPATIENT_CLINIC_OR_DEPARTMENT_OTHER): Payer: Self-pay | Admitting: *Deleted

## 2018-12-03 DIAGNOSIS — I10 Essential (primary) hypertension: Secondary | ICD-10-CM | POA: Insufficient documentation

## 2018-12-03 DIAGNOSIS — R7303 Prediabetes: Secondary | ICD-10-CM | POA: Insufficient documentation

## 2018-12-03 DIAGNOSIS — Z79899 Other long term (current) drug therapy: Secondary | ICD-10-CM | POA: Insufficient documentation

## 2018-12-03 DIAGNOSIS — Z7984 Long term (current) use of oral hypoglycemic drugs: Secondary | ICD-10-CM | POA: Insufficient documentation

## 2018-12-03 DIAGNOSIS — R059 Cough, unspecified: Secondary | ICD-10-CM

## 2018-12-03 DIAGNOSIS — R0602 Shortness of breath: Secondary | ICD-10-CM | POA: Diagnosis not present

## 2018-12-03 DIAGNOSIS — Z87891 Personal history of nicotine dependence: Secondary | ICD-10-CM | POA: Insufficient documentation

## 2018-12-03 DIAGNOSIS — J45909 Unspecified asthma, uncomplicated: Secondary | ICD-10-CM | POA: Insufficient documentation

## 2018-12-03 DIAGNOSIS — R05 Cough: Secondary | ICD-10-CM

## 2018-12-03 DIAGNOSIS — R079 Chest pain, unspecified: Secondary | ICD-10-CM | POA: Diagnosis not present

## 2018-12-03 LAB — BASIC METABOLIC PANEL
Anion gap: 8 (ref 5–15)
BUN: 11 mg/dL (ref 6–20)
CO2: 24 mmol/L (ref 22–32)
Calcium: 9 mg/dL (ref 8.9–10.3)
Chloride: 103 mmol/L (ref 98–111)
Creatinine, Ser: 0.62 mg/dL (ref 0.44–1.00)
GFR calc Af Amer: >60 mL/min (ref >60)
GFR calc non Af Amer: >60 mL/min (ref >60)
Glucose, Bld: 102 mg/dL — ABNORMAL HIGH (ref 70–99)
Potassium: 4 mmol/L (ref 3.5–5.1)
Sodium: 135 mmol/L (ref 135–145)

## 2018-12-03 LAB — D-DIMER, QUANTITATIVE: D-Dimer, Quant: 0.31 ug/mL-FEU (ref 0.00–0.50)

## 2018-12-03 LAB — CBC
HCT: 41.7 % (ref 36.0–46.0)
Hemoglobin: 13.6 g/dL (ref 12.0–15.0)
MCH: 28 pg (ref 26.0–34.0)
MCHC: 32.6 g/dL (ref 30.0–36.0)
MCV: 85.8 fL (ref 80.0–100.0)
Platelets: 244 10*3/uL (ref 150–400)
RBC: 4.86 MIL/uL (ref 3.87–5.11)
RDW: 12.6 % (ref 11.5–15.5)
WBC: 7.6 10*3/uL (ref 4.0–10.5)
nRBC: 0 % (ref 0.0–0.2)

## 2018-12-03 LAB — TROPONIN I: Troponin I: <0.03 ng/mL (ref <0.03)

## 2018-12-03 LAB — PREGNANCY, URINE: Preg Test, Ur: NEGATIVE

## 2018-12-03 MED ORDER — ALBUTEROL SULFATE HFA 108 (90 BASE) MCG/ACT IN AERS: 1.0000 | INHALATION_SPRAY | Freq: Four times a day (QID) | RESPIRATORY_TRACT | 0 refills | Status: DC | PRN

## 2018-12-03 NOTE — ED Provider Notes (Signed)
[facility] EMERGENCY DEPARTMENT Provider Note   CSN: [id number] Arrival date & time: 12/03/18  1339    History   Chief Complaint Chief Complaint  Patient presents with  . Shortness of Breath    HPI Brandy Saunders is a 28 y.o. female.     28yo F w/ PMH below including HTN, GERD, anxiety/depression, PCOS, morbid obesity, prediabetes who p/w chest pain and SOB. Pt states that 4 days ago she began having constant, central, non-radiating chest pain that she describes as an achy tightness. Yesterday, the pain worsened and she began having sharp pains with deep breaths. She has mild SOB w/ exertion but no severe breathing problems. She states that she sometimes coughs when she takes a deep breath; this started yesterday, she denies any associated URI sx such as sore throat, fever, or runny nose. She denies leg swelling, vomiting/diarrhea, recent travel, sick contacts, or h/o blood clots. She stopped taking OCPs ~1 month ago. No FH heart disease.  The history is provided by the patient.  Shortness of Breath    Past Medical History:  Diagnosis Date  . Anxiety   . Asthma   . Back pain   . Binge eating   . Carpal tunnel syndrome, bilateral   . Chest pain   . Common migraine with intractable migraine 02/02/2017  . Constipation   . Depression   . Dyspnea   . Fatty liver   . GERD (gastroesophageal reflux disease)   . Hypertension   . Joint pain   . Leg edema   . Morbidly obese ([place])   . Palpitations   . PCOS (polycystic ovarian syndrome)   . Prediabetes   . Vitamin D deficiency     Patient Active Problem List   Diagnosis Date Noted  . Palpitations 08/30/2018  . Obstructive sleep apnea 08/30/2018  . Pain of left calf 12/14/2017  . Other fatigue 10/11/2017  . Shortness of breath on exertion 10/11/2017  . Prediabetes 10/11/2017  . Chronic left-sided thoracic back pain 05/23/2017  . Acute left-sided thoracic back pain 05/09/2017  . Migraine 05/09/2017  .  Essential hypertension 05/09/2017  . Common migraine with intractable migraine 02/02/2017  . Chronic fatigue 01/06/2016  . Fatty liver 02/15/2015  . Esophageal reflux 02/15/2015  . Late menses 07/30/2014  . Morbid obesity ([place]) 07/30/2014  . Asthma without acute exacerbation 07/30/2014  . IBS (irritable bowel syndrome) 07/30/2014  . Elevated BP 07/30/2014  . PCOS (polycystic ovarian syndrome) 11/25/2012  . Carpal tunnel syndrome, bilateral     Past Surgical History:  Procedure Laterality Date  . TONSILECTOMY, ADENOIDECTOMY, BILATERAL MYRINGOTOMY AND TUBES  1994     OB History    Gravida  0   Para  0   Term  0   Preterm  0   AB  0   Living  0     SAB  0   TAB  0   Ectopic  0   Multiple  0   Live Births  0            Home Medications    Prior to Admission medications   Medication Sig Start Date End Date Taking? Authorizing Provider  metoprolol succinate (TOPROL-XL) 100 MG 24 hr tablet Take 1 tablet (100 mg total) by mouth daily. Take with or immediately following a meal. 08/29/18  Yes Roma Schanz R, DO  SUMAtriptan (IMITREX) 100 MG tablet Take 1 tablet (100 mg total) by mouth 2 (two) times daily  as needed for migraine. 04/11/18  Yes Ann Held, DO  acetaminophen (TYLENOL) 500 MG tablet Take 500 mg by mouth every 6 (six) hours as needed.    [provider]  albuterol (PROVENTIL HFA;VENTOLIN HFA) 108 (90 Base) MCG/ACT inhaler Inhale 1-2 puffs into the lungs every 6 (six) hours as needed for wheezing or shortness of breath. And or cough. 08/26/18   Libby Maw, MD  Ascorbic Acid (VITAMIN C) 1000 MG tablet Take 1,000 mg by mouth daily.    [provider]  benzonatate (TESSALON) 200 MG capsule Take 1 capsule (200 mg total) by mouth 2 (two) times daily as needed for cough. 08/26/18   Libby Maw, MD  BIOTIN 5000 PO Take 10,000 mg by mouth daily.     [provider]  Cetirizine HCl 10 MG CAPS Take 1  capsule (10 mg total) by mouth daily for 10 days. 08/11/18 08/30/18  Wieters, Hallie C, PA-C  Cyanocobalamin (VITAMIN B 12 PO) Take 2,500 mcg by mouth daily.    [provider]  metFORMIN (GLUCOPHAGE) 500 MG tablet Take 1 tablet (500 mg total) by mouth daily with breakfast. 02/19/18   Eber Jones, MD  Multiple Vitamins-Calcium (ONE-A-DAY WOMENS PO) Take 1 tablet by mouth daily.     [provider]  norethindrone-ethinyl estradiol (BLISOVI FE 1/20) 1-20 MG-MCG tablet Take 1 tablet by mouth daily. 04/11/18   Ann Held, DO  Oil Base OIL 1 Dose by Does not apply route at bedtime as needed. CBD oil per pt.    [provider]  Vitamin D, Ergocalciferol, (DRISDOL) 50000 units CAPS capsule Take 1 capsule (50,000 Units total) by mouth every 7 (seven) days. 05/16/18   Eber Jones, MD    Family History Family History  Problem Relation Age of Onset  . Hypertension Mother   . Hyperlipidemia Mother   . Obesity Mother   . Stroke Father   . Obesity Father   . Alcoholism Father   . Drug abuse Father   . Diabetes Maternal Uncle   . Heart disease Maternal Grandmother   . Colon cancer Neg Hx     Social History Social History   Tobacco Use  . Smoking status: Former Smoker    Types: Cigarettes  . Smokeless tobacco: Never Used  Substance Use Topics  . Alcohol use: Yes    Alcohol/week: 3.0 standard drinks    Types: 3 Glasses of wine per week  . Drug use: No    Types: Marijuana    Comment: once every 6 months, taking CBD     Allergies   Ibuprofen and Meloxicam   Review of Systems Review of Systems  Respiratory: Positive for shortness of breath.    All other systems reviewed and are negative except that which was mentioned in HPI   Physical Exam Updated Vital Signs BP (!) 141/102   Pulse (!) 103   Temp 98.3 F (36.8 C) (Oral)   Resp 20   Ht 5\' 6"  (1.676 m)   Wt (!) 199.6 kg   LMP 11/12/2018   SpO2 99%   BMI 71.02 kg/m    Physical Exam Vitals signs and nursing note reviewed.  Constitutional:      General: She is not in acute distress.    Appearance: She is well-developed.  HENT:     Head: Normocephalic and atraumatic.  Eyes:     Conjunctiva/sclera: Conjunctivae normal.  Neck:     Musculoskeletal: Neck supple.  Cardiovascular:     Rate and Rhythm: Regular rhythm. Tachycardia present.     Heart sounds: Normal heart sounds. No murmur.  Pulmonary:     Effort: Pulmonary effort is normal.     Breath sounds: Normal breath sounds.     Comments: Exam limited due to body habitus Abdominal:     General: Bowel sounds are normal. There is no distension.     Palpations: Abdomen is soft.     Tenderness: There is no abdominal tenderness.  Musculoskeletal:     Right lower leg: Edema present.     Left lower leg: Edema present.     Comments: Mild BLE edema  Skin:    General: Skin is warm and dry.  Neurological:     Mental Status: She is alert and oriented to person, place, and time.     Comments: Fluent speech  Psychiatric:        Judgment: Judgment normal.      ED Treatments / Results  Labs (all labs ordered are listed, but only abnormal results are displayed) Labs Reviewed  BASIC METABOLIC PANEL - Abnormal; Notable for the following components:      Result Value   Glucose, Bld 102 (*)    All other components within normal limits  D-DIMER, QUANTITATIVE (NOT AT [facility])  CBC  PREGNANCY, URINE  TROPONIN I    EKG EKG Interpretation  Date/Time:  Tuesday December 03 2018 14:40:54 EDT Ventricular Rate:  88 PR Interval:    QRS Duration: 75 QT Interval:  361 QTC Calculation: 437 R Axis:   49 Text Interpretation:  Sinus rhythm Low voltage, precordial leads Baseline wander in lead(s) II III aVF No significant change since last tracing Confirmed by Theotis Burrow [phone]) on 12/03/2018 3:05:00 PM   Radiology Dg Chest Port 1 View  Result Date: 12/03/2018 CLINICAL DATA:  Chest pain and short of breath for 1  day EXAM: PORTABLE CHEST 1 VIEW COMPARISON:  02/22/2018 FINDINGS: Normal heart size. Lungs clear. No pneumothorax. No pleural effusion. IMPRESSION: No active disease. Electronically Signed   By: Marybelle Killings M.D.   On: 12/03/2018 14:48    Procedures Procedures (including critical care time)  Medications Ordered in ED Medications - No data to display   Initial Impression / Assessment and Plan / ED Course  I have reviewed the triage vital signs and the nursing notes.  Pertinent labs & imaging results that were available during my care of the patient were reviewed by me and considered in my medical decision making (see chart for details).        Pt comfortable on exam, VS notable for hypertension and mild tachycardia, normal O2 sat and afebrile. Obtained labs including trop as well as d-dimer to evaluate for PE.  Considered COVID-19. While viral illness is possible, her symptoms are not very suggestive of viral process.  Lab work shows normal CBC and BMP.  D-dimer is negative making PE very unlikely.  Chest x-ray is normal.  Her EKG shows sinus rhythm, no ischemic changes.  We are awaiting a troponin.  I am signing out to the oncoming provider who will follow up on troponin.  I feel that single troponin is sufficient for rule out of ACS as the patient has had constant pain for 4 days and her symptoms are atypical.  If troponin is negative, anticipate discharge w/ supportive measures.  Brandy Saunders was evaluated in Emergency Department on 12/03/2018 for the symptoms described in the history of present illness.  She was evaluated in the context of the global COVID-19 pandemic, which necessitated consideration that the patient might be at risk for infection with the SARS-CoV-2 virus that causes COVID-19. Institutional protocols and algorithms that pertain to the evaluation of patients at risk for COVID-19 are in a state of rapid change based on information released by regulatory bodies  including the CDC and federal and state organizations. These policies and algorithms were followed during the patient's care in the ED.   Final Clinical Impressions(s) / ED Diagnoses   Final diagnoses:  None    ED Discharge Orders    None       , Wenda Overland, MD 12/03/18 1507

## 2018-12-03 NOTE — Progress Notes (Signed)
E-Visit for Corona Virus Screening  Based on your current symptoms, you may very well have the virus, however your symptoms are mild. Currently, not all patients are being tested. If the symptoms are mild and there is not a known exposure, performing the test is not indicated.  Coronavirus disease 2019 (COVID-19)is a respiratory illness that can spread from person to person. The virus that causes COVID-19 is a new virus that was first identified in the country of [place] but is now found in multiple other countries and has spread to the [place].  Symptoms associated with the virus are mild to severe fever, cough, and shortness of breath. There is currently no vaccine to protect against COVID-19, and there is no specific antiviral treatment for the virus.   To be considered HIGH RISK for Coronavirus (COVID-19), you have to meet the following criteria:  . Traveled to [place], [place], [place], [place] or [place]; or in the [place] to [place], [place], [place], or [place]; and have fever, cough, and shortness of breath within the last 2 weeks of travel OR  . Been in close contact with a person diagnosed with COVID-19 within the last 2 weeks and have fever, cough, and shortness of breath  . IF YOU DO NOT MEET THESE CRITERIA, YOU ARE CONSIDERED LOW RISK FOR COVID-19.   It is vitally important that if you feel that you have an infection such as this virus or any other virus that you stay home and away from places where you may spread it to others.  You should self-quarantine for 14 days if you have symptoms that could potentially be coronavirus and avoid contact with people age 89 and older.     You may take acetaminophen (Tylenol) as needed for fever.   Reduce your risk of any infection by using the same precautions used for avoiding the common cold or flu: Wash your hands often with soap and warm water for at least 20 seconds.  If soap and water are not readily available, use an  alcohol-based hand sanitizer with at least 60% alcohol.  If coughing or sneezing, cover your mouth and nose by coughing or sneezing into the elbow areas of your shirt or coat, into a tissue or into your sleeve (not your hands). Avoid shaking hands with others and consider head nods or verbal greetings only.  Avoid touching your eyes,nose, or mouth with unwashed hands. Avoid close contact with people who are sick. Avoid places or events with large numbers of people in one location, like concerts or sporting events. Carefully consider travel plans you have or are making. If you are planning any travel outside or inside the [place], visit the CDC'sTravelers' Health webpagefor the latest health notices. If you have some symptoms but not all symptoms, continue to monitor at home and seek medical attention if your symptoms worsen. If you are having a medical emergency, call 911.  HOME CARE Only take medications as instructed by your medical team. Drink plenty of fluids and get plenty of rest. A steam or ultrasonic humidifier can help if you have congestion.   GET HELP RIGHT AWAY IF: You develop worsening fever. You become short of breath You cough up blood. Your symptoms become more severe MAKE SURE YOU  Understand these instructions. Will watch your condition. Will get help right away if you are not doing well or get worse.  Your e-visit answers were reviewed by a board certified advanced clinical practitioner to complete your  personal care plan.  Depending on the condition, your plan could have included both over the counter or prescription medications.  If there is a problem please reply once you have received a response from your provider. Your safety is important to [place].  If you have drug allergies check your prescription carefully.    You can use MyChart to ask questions about today's visit, request a non-urgent call back, or ask for a work or school excuse for 24 hours related to this  e-Visit. If it has been greater than 24 hours you will need to follow up with your provider, or enter a new e-Visit to address those concerns. You will get an e-mail in the next two days asking about your experience.  I hope that your e-visit has been valuable and will speed your recovery. Thank you for using e-visits.    ===View-only below this line===   ----- Message -----    From: [name]    Sent: 12/03/2018 11:06 AM EDT      To: E-Visit Mailing List Subject: E-Visit Submission: CoronaVirus ([id number]) Screening  E-Visit Submission: CoronaVirus ([id number]) Screening --------------------------------  Question: Do you have any of the following?  Answer:   Cough  Question: Do you have any of the following additional symptoms?  Answer:   None of these  Question: Have you had a fever? Answer:   No  Question: Have others in your home or workplace had similar symptoms? Answer:   No  Question: When did your symptoms start? Answer:   Friday  Question: Have you recently visited any of the following countries? Answer:   None of these  Question: If you have traveled anywhere in the last  2 months please document where you have visited: Answer:     Question: Have you recently been around others from these countries or visited these countries who have had coughing or fever? Answer:   No  Question: Have you recently been around anyone who has been diagnosed with Corona virus? Answer:   No  Question: Have you been taking any medications? Answer:   No  Question: If taking medications for these symptoms, please list the names and whether they are helping or not Answer:     Question: Are you treated for any of the following conditions: Asthma, COPD, Diabetes, Renal Failure (on Dialysis), AIDS, any Neuromuscular disease that effects the clearing of secretions, Heart Failure, or Heart Disease? Answer:   Yes  Question: Please enter a phone number where you can be reached if  we have additional questions about your symptoms Answer:   [phone]  Question: Please list your medication allergies that you may have ? (If 'none' , please list as 'none') Answer:   None  Question: Please list any additional comments  Answer:     Question: Are you pregnant? Answer:   I am confident that I am not pregnant  Question: Are you breastfeeding? Answer:   No  A total of 5-10 minutes was spent evaluating this patients questionnaire and formulating a plan of care.

## 2018-12-03 NOTE — Telephone Encounter (Signed)
Patient reports she has been wheezing. Patient also reports she has had pain with breathing- sharp pain with deep breaths. Patient has developed dry cough- patient states she did get clear sputum yesterday. Offered to get patient information for office to schedule virtual visit and patient declines- she states she has been laid off and her insurance has lapsed. Told patient that she can go to [email or web address] - the office may still reach out to her. Discussed symptoms to treat at home and when to go to ED- advised patient do not wait too long.    Reason for Disposition . [1] MILD difficulty breathing (e.g., minimal/no SOB at rest, SOB with walking, pulse <100) AND [2] NEW-onset or WORSE than normal  Answer Assessment - Initial Assessment Questions 1. RESPIRATORY STATUS: "Describe your breathing?" (e.g., wheezing, shortness of breath, unable to speak, severe coughing)      Wheezing at night- patient can hear herself at night, pain with deep breath 2. ONSET: "When did this breathing problem begin?"      4 days- pain since Friday 3. PATTERN "Does the difficult breathing come and go, or has it been constant since it started?"      Constant - with deep breath 4. SEVERITY: "How bad is your breathing?" (e.g., mild, moderate, severe)    - MILD: No SOB at rest, mild SOB with walking, speaks normally in sentences, can lay down, no retractions, pulse < 100.    - MODERATE: SOB at rest, SOB with minimal exertion and prefers to sit, cannot lie down flat, speaks in phrases, mild retractions, audible wheezing, pulse 100-120.    - SEVERE: Very SOB at rest, speaks in single words, struggling to breathe, sitting hunched forward, retractions, pulse > 120      mild 5. RECURRENT SYMPTOM: "Have you had difficulty breathing before?" If so, ask: "When was the last time?" and "What happened that time?"      Patient has childhood history of asthma- no treatment for years- bronchitis this year- but didn't need inhaler 6.  CARDIAC HISTORY: "Do you have any history of heart disease?" (e.g., heart attack, angina, bypass surgery, angioplasty)      Blood pressure medication 7. LUNG HISTORY: "Do you have any history of lung disease?"  (e.g., pulmonary embolus, asthma, emphysema)     Childhood asthma 8. CAUSE: "What do you think is causing the breathing problem?"      unsure 9. OTHER SYMPTOMS: "Do you have any other symptoms? (e.g., dizziness, runny nose, cough, chest pain, fever)     Cough, chest pain with deep breath 10. PREGNANCY: "Is there any chance you are pregnant?" "When was your last menstrual period?"       No- LMP- 11/12/18 11. TRAVEL: "Have you traveled out of the country in the last month?" (e.g., travel history, exposures)       No travel or known exposure  Protocols used: BREATHING DIFFICULTY-A-AH

## 2018-12-03 NOTE — ED Notes (Signed)
Pt c/o chest pain x 4 days with a dry cough x 1 day. Pt denies N?V, denies fever or abdominal pain. Pt states was advised to present to ED by PCP

## 2018-12-03 NOTE — ED Notes (Signed)
ED Provider at bedside. 

## 2018-12-03 NOTE — ED Notes (Signed)
X-ray at bedside

## 2018-12-03 NOTE — Telephone Encounter (Signed)
Please call pt-- she needs to go to ER if she does not want virtual visit

## 2018-12-03 NOTE — Telephone Encounter (Signed)
Called informed the patient of PCP instructions. She agreed and stated would do asap.

## 2018-12-03 NOTE — ED Triage Notes (Signed)
4 days ago she had a pain in her chest that has progressed to a slight cough. She is SOB today.

## 2018-12-03 NOTE — ED Provider Notes (Signed)
Blood pressure (!) 141/102, pulse (!) 103, temperature 98.3 F (36.8 C), temperature source Oral, resp. rate 20, height 5\' 6"  (1.676 m), weight (!) 199.6 kg, last menstrual period 11/12/2018, SpO2 99 %.  Assuming care from Dr. Rex Kras.  In short, Brandy Saunders is a 28 y.o. female with a chief complaint of Shortness of Breath .  Refer to the original H&P for additional details.  The current plan of care is to f/u with labs.  03:28 PM  Lab work reviewed.  Patient's tachycardia has improved.  D-dimer and troponin are negative.  Chest x-ray reviewed with no acute findings.  EKG largely unremarkable.  And for inhaler for symptomatic relief as needed.  Will discharge home with plan for PCP follow-up.  Discussed ED return precautions in detail.    EKG Interpretation  Date/Time:  Tuesday December 03 2018 14:40:54 EDT Ventricular Rate:  88 PR Interval:    QRS Duration: 75 QT Interval:  361 QTC Calculation: 437 R Axis:   49 Text Interpretation:  Sinus rhythm Low voltage, precordial leads Baseline wander in lead(s) II III aVF No significant change since last tracing Confirmed by Theotis Burrow [phone]) on 12/03/2018 3:05:00 PM          Kalijah Westfall, Wonda Olds, MD 12/03/18 [id number]

## 2018-12-03 NOTE — Discharge Instructions (Signed)
Use the prescription as needed for trouble breathing. Return to the ED with any new or worsening symptoms.

## 2018-12-10 ENCOUNTER — Other Ambulatory Visit: Payer: Self-pay

## 2018-12-10 ENCOUNTER — Ambulatory Visit (INDEPENDENT_AMBULATORY_CARE_PROVIDER_SITE_OTHER): Payer: Self-pay | Admitting: Family Medicine

## 2018-12-10 DIAGNOSIS — I10 Essential (primary) hypertension: Secondary | ICD-10-CM

## 2018-12-10 MED ORDER — METOPROLOL SUCCINATE ER 100 MG PO TB24: ORAL_TABLET | ORAL | 3 refills | Status: DC

## 2018-12-10 NOTE — Progress Notes (Signed)
Virtual Visit via Video Note  I connected with Brandy Saunders on 12/10/18 at  2:00 PM EDT by a video enabled telemedicine application and verified that I am speaking with the correct person using two identifiers.   I discussed the limitations of evaluation and management by telemedicine and the availability of in person appointments. The patient expressed understanding and agreed to proceed.  History of Present Illness: Pt home and was in er last tues for cp / gerd.   Pain has subsided   She feels fine now  No sob, no cp, no palpitations   Observations/Objective: 441  Ht 5 ft 6 in  High in er , p 80  Pt in NAD,  rr normal   Assessment and Plan: 1. Essential hypertension Poorly controlled will alter medications, encouraged DASH diet, minimize caffeine and obtain adequate sleep. Report concerning symptoms and follow up as directed and as needed - metoprolol succinate (TOPROL-XL) 100 MG 24 hr tablet; 1 1/2 po qd    Take with or immediately following a meal.  Dispense: 135 tablet; Refill: 3  Gave pt infor for cone payment plan--- bp check 2-3 weeks  Follow Up Instructions:    I discussed the assessment and treatment plan with the patient. The patient was provided an opportunity to ask questions and all were answered. The patient agreed with the plan and demonstrated an understanding of the instructions.   The patient was advised to call back or seek an in-person evaluation if the symptoms worsen or if the condition fails to improve as anticipated.    Ann Held, DO

## 2018-12-11 ENCOUNTER — Telehealth: Payer: Self-pay

## 2018-12-11 ENCOUNTER — Encounter: Payer: Self-pay | Admitting: Family Medicine

## 2018-12-11 NOTE — Telephone Encounter (Signed)
Spoke with the patient and she stated that she doesn't currently have any insurance. She stated that she feels fine. That she would call us to schedule a follow up once she has insurance again.

## 2018-12-16 ENCOUNTER — Ambulatory Visit: Payer: BLUE CROSS/BLUE SHIELD | Admitting: Neurology

## 2018-12-19 ENCOUNTER — Ambulatory Visit: Payer: Self-pay | Admitting: Family Medicine

## 2019-03-05 ENCOUNTER — Other Ambulatory Visit: Payer: Self-pay

## 2019-03-05 NOTE — Progress Notes (Deleted)
[place] at [facility] [address] [place], [place] [address] [phone]  Date:  03/06/2019   Name:  Brandy Saunders   DOB:  1991-03-15   MRN:  [id number]  PCP:  Ann Held, DO    Chief Complaint: No chief complaint on file.   History of Present Illness:  Brandy Saunders is a 28 y.o. very pleasant female patient who presents with the following:  Pt of Dr. Etter Sjogren who is here today to evaluate amenorrhea History of hypertension  Patient Active Problem List   Diagnosis Date Noted  . Palpitations 08/30/2018  . Obstructive sleep apnea 08/30/2018  . Pain of left calf 12/14/2017  . Other fatigue 10/11/2017  . Shortness of breath on exertion 10/11/2017  . Prediabetes 10/11/2017  . Chronic left-sided thoracic back pain 05/23/2017  . Acute left-sided thoracic back pain 05/09/2017  . Migraine 05/09/2017  . Essential hypertension 05/09/2017  . Common migraine with intractable migraine 02/02/2017  . Chronic fatigue 01/06/2016  . Fatty liver 02/15/2015  . Esophageal reflux 02/15/2015  . Late menses 07/30/2014  . Morbid obesity ([place]) 07/30/2014  . Asthma without acute exacerbation 07/30/2014  . IBS (irritable bowel syndrome) 07/30/2014  . Elevated BP 07/30/2014  . PCOS (polycystic ovarian syndrome) 11/25/2012  . Carpal tunnel syndrome, bilateral     Past Medical History:  Diagnosis Date  . Anxiety   . Asthma   . Back pain   . Binge eating   . Carpal tunnel syndrome, bilateral   . Chest pain   . Common migraine with intractable migraine 02/02/2017  . Constipation   . Depression   . Dyspnea   . Fatty liver   . GERD (gastroesophageal reflux disease)   . Hypertension   . Joint pain   . Leg edema   . Morbidly obese ([place])   . Palpitations   . PCOS (polycystic ovarian syndrome)   . Prediabetes   . Vitamin D deficiency     Past Surgical History:  Procedure Laterality Date  . TONSILECTOMY,  ADENOIDECTOMY, BILATERAL MYRINGOTOMY AND TUBES  1994    Social History   Tobacco Use  . Smoking status: Former Smoker    Types: Cigarettes  . Smokeless tobacco: Never Used  Substance Use Topics  . Alcohol use: Yes    Alcohol/week: 3.0 standard drinks    Types: 3 Glasses of wine per week  . Drug use: No    Types: Marijuana    Comment: once every 6 months, taking CBD    Family History  Problem Relation Age of Onset  . Hypertension Mother   . Hyperlipidemia Mother   . Obesity Mother   . Stroke Father   . Obesity Father   . Alcoholism Father   . Drug abuse Father   . Diabetes Maternal Uncle   . Heart disease Maternal Grandmother   . Colon cancer Neg Hx     Allergies  Allergen Reactions  . Ibuprofen Diarrhea  . Meloxicam     Pt stated, "It made my emotions out of wack; it made my left hand numb"    Medication list has been reviewed and updated.  Current Outpatient Medications on File Prior to Visit  Medication Sig Dispense Refill  . acetaminophen (TYLENOL) 500 MG tablet Take 500 mg by mouth every 6 (six) hours as needed.    Marland Kitchen albuterol (PROVENTIL HFA;VENTOLIN HFA) 108 (90 Base) MCG/ACT inhaler Inhale 1-2 puffs into the  lungs every 6 (six) hours as needed for wheezing or shortness of breath. 1 Inhaler 0  . Ascorbic Acid (VITAMIN C) 1000 MG tablet Take 1,000 mg by mouth daily.    . benzonatate (TESSALON) 200 MG capsule Take 1 capsule (200 mg total) by mouth 2 (two) times daily as needed for cough. 20 capsule 0  . BIOTIN 5000 PO Take 10,000 mg by mouth daily.     . Cetirizine HCl 10 MG CAPS Take 1 capsule (10 mg total) by mouth daily for 10 days. 10 capsule 0  . Cyanocobalamin (VITAMIN B 12 PO) Take 2,500 mcg by mouth daily.    . metFORMIN (GLUCOPHAGE) 500 MG tablet Take 1 tablet (500 mg total) by mouth daily with breakfast. 90 tablet 0  . metoprolol succinate (TOPROL-XL) 100 MG 24 hr tablet 1 1/2 po qd    Take with or immediately following a meal. 135 tablet 3  . Multiple  Vitamins-Calcium (ONE-A-DAY WOMENS PO) Take 1 tablet by mouth daily.     . norethindrone-ethinyl estradiol (BLISOVI FE 1/20) 1-20 MG-MCG tablet Take 1 tablet by mouth daily. 84 tablet 3  . Oil Base OIL 1 Dose by Does not apply route at bedtime as needed. CBD oil per pt.    . SUMAtriptan (IMITREX) 100 MG tablet Take 1 tablet (100 mg total) by mouth 2 (two) times daily as needed for migraine. 60 tablet 0  . Vitamin D, Ergocalciferol, (DRISDOL) 50000 units CAPS capsule Take 1 capsule (50,000 Units total) by mouth every 7 (seven) days. 4 capsule 0   No current facility-administered medications on file prior to visit.     Review of Systems:  As per HPI- otherwise negative.   Physical Examination: There were no vitals filed for this visit. There were no vitals filed for this visit. There is no height or weight on file to calculate BMI. Ideal Body Weight:    GEN: WDWN, NAD, Non-toxic, A & O x 3 HEENT: Atraumatic, Normocephalic. Neck supple. No masses, No LAD. Ears and Nose: No external deformity. CV: RRR, No M/G/R. No JVD. No thrill. No extra heart sounds. PULM: CTA B, no wheezes, crackles, rhonchi. No retractions. No resp. distress. No accessory muscle use. ABD: S, NT, ND, +BS. No rebound. No HSM. EXTR: No c/c/e NEURO Normal gait.  PSYCH: Normally interactive. Conversant. Not depressed or anxious appearing.  Calm demeanor.    Assessment and Plan: ***  Signed Lamar Blinks, MD

## 2019-03-06 ENCOUNTER — Ambulatory Visit: Payer: Self-pay | Admitting: Family Medicine

## 2019-03-10 ENCOUNTER — Other Ambulatory Visit: Payer: Self-pay

## 2019-03-10 ENCOUNTER — Ambulatory Visit (INDEPENDENT_AMBULATORY_CARE_PROVIDER_SITE_OTHER): Payer: Self-pay | Admitting: Family Medicine

## 2019-03-10 ENCOUNTER — Encounter: Payer: Self-pay | Admitting: Family Medicine

## 2019-03-10 VITALS — BP 149/67 | HR 77 | Temp 98.7°F | Resp 18 | Ht 66.0 in | Wt >= 6400 oz

## 2019-03-10 DIAGNOSIS — K219 Gastro-esophageal reflux disease without esophagitis: Secondary | ICD-10-CM

## 2019-03-10 DIAGNOSIS — N912 Amenorrhea, unspecified: Secondary | ICD-10-CM | POA: Diagnosis not present

## 2019-03-10 DIAGNOSIS — I10 Essential (primary) hypertension: Secondary | ICD-10-CM

## 2019-03-10 LAB — LIPID PANEL
Cholesterol: 153 mg/dL (ref 0–200)
HDL: 48.6 mg/dL (ref >39.00)
LDL Cholesterol: 84 mg/dL (ref 0–99)
NonHDL: 104.31
Total CHOL/HDL Ratio: 3
Triglycerides: 104 mg/dL (ref 0.0–149.0)
VLDL: 20.8 mg/dL (ref 0.0–40.0)

## 2019-03-10 LAB — COMPREHENSIVE METABOLIC PANEL
ALT: 29 U/L (ref 0–35)
AST: 16 U/L (ref 0–37)
Albumin: 4.3 g/dL (ref 3.5–5.2)
Alkaline Phosphatase: 55 U/L (ref 39–117)
BUN: 15 mg/dL (ref 6–23)
CO2: 25 mEq/L (ref 19–32)
Calcium: 9 mg/dL (ref 8.4–10.5)
Chloride: 103 mEq/L (ref 96–112)
Creatinine, Ser: 0.68 mg/dL (ref 0.40–1.20)
GFR: 123.83 mL/min (ref >60.00)
Glucose, Bld: 100 mg/dL — ABNORMAL HIGH (ref 70–99)
Potassium: 4.4 mEq/L (ref 3.5–5.1)
Sodium: 139 mEq/L (ref 135–145)
Total Bilirubin: 0.5 mg/dL (ref 0.2–1.2)
Total Protein: 6.9 g/dL (ref 6.0–8.3)

## 2019-03-10 LAB — CBC WITH DIFFERENTIAL/PLATELET
Basophils Absolute: 0 10*3/uL (ref 0.0–0.1)
Basophils Relative: 0.6 % (ref 0.0–3.0)
Eosinophils Absolute: 0.1 10*3/uL (ref 0.0–0.7)
Eosinophils Relative: 2.9 % (ref 0.0–5.0)
HCT: 41.7 % (ref 36.0–46.0)
Hemoglobin: 13.5 g/dL (ref 12.0–15.0)
Lymphocytes Relative: 37.9 % (ref 12.0–46.0)
Lymphs Abs: 1.7 10*3/uL (ref 0.7–4.0)
MCHC: 32.4 g/dL (ref 30.0–36.0)
MCV: 84.8 fl (ref 78.0–100.0)
Monocytes Absolute: 0.2 10*3/uL (ref 0.1–1.0)
Monocytes Relative: 5.1 % (ref 3.0–12.0)
Neutro Abs: 2.4 10*3/uL (ref 1.4–7.7)
Neutrophils Relative %: 53.5 % (ref 43.0–77.0)
Platelets: 236 10*3/uL (ref 150.0–400.0)
RBC: 4.92 Mil/uL (ref 3.87–5.11)
RDW: 14.2 % (ref 11.5–15.5)
WBC: 4.5 10*3/uL (ref 4.0–10.5)

## 2019-03-10 LAB — TSH: TSH: 2.56 u[IU]/mL (ref 0.35–4.50)

## 2019-03-10 LAB — H. PYLORI ANTIBODY, IGG: H Pylori IgG: NEGATIVE

## 2019-03-10 MED ORDER — PANTOPRAZOLE SODIUM 40 MG PO TBEC: 40.0000 mg | DELAYED_RELEASE_TABLET | Freq: Every day | ORAL | 3 refills | Status: DC

## 2019-03-10 MED ORDER — METOPROLOL SUCCINATE ER 100 MG PO TB24: ORAL_TABLET | ORAL | 3 refills | Status: DC

## 2019-03-10 NOTE — Assessment & Plan Note (Signed)
ppi per orders Check labs

## 2019-03-10 NOTE — Assessment & Plan Note (Signed)
Refill meds

## 2019-03-10 NOTE — Progress Notes (Signed)
Patient ID: Brandy Saunders, female    DOB: [birth year]  Age: 28 y.o. MRN: [id number]    Subjective:  Subjective  HPI Brandy Saunders presents for amenorrhea since April.  Multiple neg preg tests.  She also c/o gerd problems and omeprazole helped.  She is clearing her throat all the time .  j She also c/o hard area in her abd -- she has lost a lot of weight in the last few months on keto.  Pt needs labs as well .  No other complaints.   Review of Systems  Constitutional: Negative for appetite change, diaphoresis, fatigue and unexpected weight change.  Eyes: Negative for pain, redness and visual disturbance.  Respiratory: Negative for cough, chest tightness, shortness of breath and wheezing.   Cardiovascular: Negative for chest pain, palpitations and leg swelling.  Gastrointestinal: Positive for abdominal pain. Negative for diarrhea, nausea, rectal pain and vomiting.  Endocrine: Negative for cold intolerance, heat intolerance, polydipsia, polyphagia and polyuria.  Genitourinary: Negative for difficulty urinating, dysuria and frequency.  Neurological: Negative for dizziness, light-headedness, numbness and headaches.    History Past Medical History:  Diagnosis Date   Anxiety    Asthma    Back pain    Binge eating    Carpal tunnel syndrome, bilateral    Chest pain    Common migraine with intractable migraine 02/02/2017   Constipation    Depression    Dyspnea    Fatty liver    GERD (gastroesophageal reflux disease)    Hypertension    Joint pain    Leg edema    Morbidly obese ([facility])    Palpitations    PCOS (polycystic ovarian syndrome)    Prediabetes    Vitamin D deficiency     She has a past surgical history that includes Tonsilectomy, adenoidectomy, bilateral myringotomy and tubes (1994).   Her family history includes Alcoholism in her father; Diabetes in her maternal uncle; Drug abuse in her father; Heart disease in her maternal grandmother;  Hyperlipidemia in her mother; Hypertension in her mother; Obesity in her father and mother; Stroke in her father.She reports that she has quit smoking. Her smoking use included cigarettes. She has never used smokeless tobacco. She reports current alcohol use of about 3.0 standard drinks of alcohol per week. She reports that she does not use drugs.  Current Outpatient Medications on File Prior to Visit  Medication Sig Dispense Refill   acetaminophen (TYLENOL) 500 MG tablet Take 500 mg by mouth every 6 (six) hours as needed.     albuterol (PROVENTIL HFA;VENTOLIN HFA) 108 (90 Base) MCG/ACT inhaler Inhale 1-2 puffs into the lungs every 6 (six) hours as needed for wheezing or shortness of breath. 1 Inhaler 0   Ascorbic Acid (VITAMIN C) 1000 MG tablet Take 1,000 mg by mouth daily.     BIOTIN 5000 PO Take 10,000 mg by mouth daily.      Cyanocobalamin (VITAMIN B 12 PO) Take 2,500 mcg by mouth daily.     Multiple Vitamins-Calcium (ONE-A-DAY WOMENS PO) Take 1 tablet by mouth daily.      SUMAtriptan (IMITREX) 100 MG tablet Take 1 tablet (100 mg total) by mouth 2 (two) times daily as needed for migraine. 60 tablet 0   Oil Base OIL 1 Dose by Does not apply route at bedtime as needed. CBD oil per pt.     Vitamin D, Ergocalciferol, (DRISDOL) 50000 units CAPS capsule Take 1 capsule (50,000 Units total) by mouth every 7 (seven) days. (Patient  not taking: Reported on 03/10/2019) 4 capsule 0   No current facility-administered medications on file prior to visit.      Objective:  Objective  Physical Exam Constitutional:      Appearance: She is well-developed.  HENT:     Head: Normocephalic and atraumatic.  Eyes:     Conjunctiva/sclera: Conjunctivae normal.  Neck:     Musculoskeletal: Normal range of motion and neck supple.     Thyroid: No thyromegaly.     Vascular: No carotid bruit or JVD.  Cardiovascular:     Rate and Rhythm: Normal rate and regular rhythm.     Heart sounds: Normal heart sounds.  No murmur.  Pulmonary:     Effort: Pulmonary effort is normal. No respiratory distress.     Breath sounds: Normal breath sounds. No wheezing or rales.  Chest:     Chest wall: No tenderness.  Neurological:     Mental Status: She is alert and oriented to person, place, and time.    BP (!) 149/67 (BP Location: Left Arm, Patient Position: Sitting, Cuff Size: Large)    Pulse 77    Temp 98.7 F (37.1 C) (Oral)    Resp 18    Ht 5\' 6"  (1.676 m)    Wt (!) 434 lb (196.9 kg)    SpO2 99%    BMI 70.05 kg/m  Wt Readings from Last 3 Encounters:  03/10/19 (!) 434 lb (196.9 kg)  12/03/18 (!) 440 lb (199.6 kg)  11/06/18 (!) 430 lb (195 kg)     Lab Results  Component Value Date   WBC 4.5 03/10/2019   HGB 13.5 03/10/2019   HCT 41.7 03/10/2019   PLT 236.0 03/10/2019   GLUCOSE 100 (H) 03/10/2019   CHOL 153 03/10/2019   TRIG 104.0 03/10/2019   HDL 48.60 03/10/2019   LDLCALC 84 03/10/2019   ALT 29 03/10/2019   AST 16 03/10/2019   NA 139 03/10/2019   K 4.4 03/10/2019   CL 103 03/10/2019   CREATININE 0.68 03/10/2019   BUN 15 03/10/2019   CO2 25 03/10/2019   TSH 2.56 03/10/2019   HGBA1C 5.8 (H) 05/02/2018    Dg Chest Port 1 View  Result Date: 12/03/2018 CLINICAL DATA:  Chest pain and short of breath for 1 day EXAM: PORTABLE CHEST 1 VIEW COMPARISON:  02/22/2018 FINDINGS: Normal heart size. Lungs clear. No pneumothorax. No pleural effusion. IMPRESSION: No active disease. Electronically Signed   By: Marybelle Killings M.D.   On: 12/03/2018 14:48     Assessment & Plan:  Plan  I have discontinued Brandy Saunders's metFORMIN, norethindrone-ethinyl estradiol, Cetirizine HCl, and benzonatate. I have also changed her metoprolol succinate. Additionally, I am having her start on pantoprazole. Lastly, I am having her maintain her Multiple Vitamins-Calcium (ONE-A-DAY WOMENS PO), BIOTIN 5000 PO, vitamin C, acetaminophen, Cyanocobalamin (VITAMIN B 12 PO), SUMAtriptan, Oil Base, Vitamin D (Ergocalciferol), and  albuterol.  Meds ordered this encounter  Medications   metoprolol succinate (TOPROL-XL) 100 MG 24 hr tablet    Sig: 1  po qd    Take with or immediately following a meal.    Dispense:  135 tablet    Refill:  3   pantoprazole (PROTONIX) 40 MG tablet    Sig: Take 1 tablet (40 mg total) by mouth daily.    Dispense:  30 tablet    Refill:  3    Problem List Items Addressed This Visit      Unprioritized   Esophageal reflux -  Primary    ppi per orders Check labs       Relevant Medications   pantoprazole (PROTONIX) 40 MG tablet   Other Relevant Orders   CBC with Differential/Platelet (Completed)   H. pylori antibody, IgG (Completed)   Comprehensive metabolic panel (Completed)   Essential hypertension    Refill meds       Relevant Medications   metoprolol succinate (TOPROL-XL) 100 MG 24 hr tablet   Morbid obesity ([place])   Relevant Orders   CBC with Differential/Platelet (Completed)   H. pylori antibody, IgG (Completed)   Lipid panel (Completed)   Comprehensive metabolic panel (Completed)   TSH (Completed)   B-HCG Quant (Completed)   Testosterone, Free, Total, SHBG   Insulin, random    Other Visit Diagnoses    Amenorrhea       Relevant Orders   CBC with Differential/Platelet (Completed)   Comprehensive metabolic panel (Completed)   TSH (Completed)   B-HCG Quant (Completed)   Testosterone, Free, Total, SHBG   POCT urine pregnancy   Hormone Panel      Follow-up: Return in about 3 months (around 06/10/2019), or if symptoms worsen or fail to improve, for hypertension, hyperlipidemia.  Ann Held, DO

## 2019-03-10 NOTE — Patient Instructions (Signed)

## 2019-03-11 ENCOUNTER — Other Ambulatory Visit: Payer: Self-pay | Admitting: Family Medicine

## 2019-03-11 ENCOUNTER — Encounter: Payer: Self-pay | Admitting: Family Medicine

## 2019-03-11 DIAGNOSIS — R7303 Prediabetes: Secondary | ICD-10-CM

## 2019-03-11 DIAGNOSIS — K219 Gastro-esophageal reflux disease without esophagitis: Secondary | ICD-10-CM

## 2019-03-11 LAB — INSULIN, RANDOM: Insulin: 38.6 u[IU]/mL — ABNORMAL HIGH

## 2019-03-11 MED ORDER — METFORMIN HCL 500 MG PO TABS: 500.0000 mg | ORAL_TABLET | Freq: Every day | ORAL | 1 refills | Status: DC

## 2019-03-18 ENCOUNTER — Encounter: Payer: Self-pay | Admitting: Family Medicine

## 2019-03-19 ENCOUNTER — Other Ambulatory Visit: Payer: Self-pay | Admitting: Family Medicine

## 2019-03-19 DIAGNOSIS — R1010 Upper abdominal pain, unspecified: Secondary | ICD-10-CM

## 2019-03-19 LAB — HORMONE PANEL (T4,TSH,FSH,TESTT,SHBG,DHEA,ETC)
DHEA-Sulfate, LCMS: 155 ug/dL
Estradiol, Serum, MS: 35 pg/mL
Estrone Sulfate: 259 ng/dL
Follicle Stimulating Hormone: 7.1 m[IU]/mL
Free T-3: 3.2 pg/mL
Free Testosterone, Serum: 4.1 pg/mL
Progesterone, Serum: <10 ng/dL
Sex Hormone Binding Globulin: 31 nmol/L
T4: 7.4 ug/dL
TSH: 2.9 uU/mL
Testosterone, Serum (Total): 27 ng/dL
Testosterone-% Free: 1.5 %
Triiodothyronine (T-3), Serum: 121 ng/dL

## 2019-03-19 NOTE — Telephone Encounter (Signed)
I did not really feel anything abnormal.   We had discussed maybe it was because she had lost some weight and could feel more But I did put and order in for an [place] ---- she can go into radiolgoy downstairs and schdule it or call them

## 2019-04-04 ENCOUNTER — Ambulatory Visit (HOSPITAL_BASED_OUTPATIENT_CLINIC_OR_DEPARTMENT_OTHER)
Admission: RE | Admit: 2019-04-04 | Discharge: 2019-04-04 | Disposition: A | Discharge status: Home / Self Care | Payer: BC Managed Care – PPO | Source: Ambulatory Visit | Attending: Family Medicine | Admitting: Family Medicine

## 2019-04-04 ENCOUNTER — Other Ambulatory Visit: Payer: Self-pay

## 2019-04-04 ENCOUNTER — Other Ambulatory Visit: Payer: Self-pay | Admitting: Family Medicine

## 2019-04-04 DIAGNOSIS — R1010 Upper abdominal pain, unspecified: Secondary | ICD-10-CM

## 2019-04-04 DIAGNOSIS — R935 Abnormal findings on diagnostic imaging of other abdominal regions, including retroperitoneum: Secondary | ICD-10-CM

## 2019-04-04 DIAGNOSIS — R109 Unspecified abdominal pain: Secondary | ICD-10-CM | POA: Diagnosis not present

## 2019-04-07 ENCOUNTER — Ambulatory Visit (HOSPITAL_BASED_OUTPATIENT_CLINIC_OR_DEPARTMENT_OTHER): Payer: BLUE CROSS/BLUE SHIELD

## 2019-04-08 ENCOUNTER — Other Ambulatory Visit: Payer: Self-pay

## 2019-04-08 ENCOUNTER — Ambulatory Visit (HOSPITAL_BASED_OUTPATIENT_CLINIC_OR_DEPARTMENT_OTHER)
Admission: RE | Admit: 2019-04-08 | Discharge: 2019-04-08 | Disposition: A | Discharge status: Home / Self Care | Payer: BC Managed Care – PPO | Source: Ambulatory Visit | Attending: Family Medicine | Admitting: Family Medicine

## 2019-04-08 ENCOUNTER — Encounter (HOSPITAL_BASED_OUTPATIENT_CLINIC_OR_DEPARTMENT_OTHER): Payer: Self-pay

## 2019-04-08 DIAGNOSIS — R935 Abnormal findings on diagnostic imaging of other abdominal regions, including retroperitoneum: Secondary | ICD-10-CM | POA: Insufficient documentation

## 2019-04-08 DIAGNOSIS — K760 Fatty (change of) liver, not elsewhere classified: Secondary | ICD-10-CM | POA: Diagnosis not present

## 2019-04-08 MED ORDER — IOHEXOL 300 MG/ML  SOLN
100.0000 mL | Freq: Once | INTRAMUSCULAR | Status: AC | PRN
  Administered 2019-04-08: 100 mL via INTRAVENOUS

## 2019-04-11 ENCOUNTER — Ambulatory Visit (HOSPITAL_BASED_OUTPATIENT_CLINIC_OR_DEPARTMENT_OTHER)
Admission: RE | Admit: 2019-04-11 | Discharge: 2019-04-11 | Disposition: A | Discharge status: Home / Self Care | Payer: BC Managed Care – PPO | Source: Ambulatory Visit | Attending: Family Medicine | Admitting: Family Medicine

## 2019-04-11 ENCOUNTER — Other Ambulatory Visit: Payer: Self-pay

## 2019-04-11 DIAGNOSIS — R935 Abnormal findings on diagnostic imaging of other abdominal regions, including retroperitoneum: Secondary | ICD-10-CM | POA: Insufficient documentation

## 2019-04-11 NOTE — Progress Notes (Signed)
Bilateral renal doppler performed     04/11/19 Brandy Saunders

## 2019-05-14 ENCOUNTER — Ambulatory Visit (INDEPENDENT_AMBULATORY_CARE_PROVIDER_SITE_OTHER): Payer: BC Managed Care – PPO | Admitting: Obstetrics & Gynecology

## 2019-05-14 ENCOUNTER — Other Ambulatory Visit: Payer: Self-pay

## 2019-05-14 ENCOUNTER — Encounter: Payer: Self-pay | Admitting: Obstetrics & Gynecology

## 2019-05-14 VITALS — BP 129/98 | HR 73 | Ht 65.0 in | Wt >= 6400 oz

## 2019-05-14 DIAGNOSIS — Z124 Encounter for screening for malignant neoplasm of cervix: Secondary | ICD-10-CM

## 2019-05-14 DIAGNOSIS — Z113 Encounter for screening for infections with a predominantly sexual mode of transmission: Secondary | ICD-10-CM | POA: Diagnosis not present

## 2019-05-14 DIAGNOSIS — Z01419 Encounter for gynecological examination (general) (routine) without abnormal findings: Secondary | ICD-10-CM

## 2019-05-14 DIAGNOSIS — N913 Primary oligomenorrhea: Secondary | ICD-10-CM

## 2019-05-14 MED ORDER — MEDROXYPROGESTERONE ACETATE 10 MG PO TABS: 10.0000 mg | ORAL_TABLET | Freq: Every day | ORAL | 2 refills | Status: DC

## 2019-05-14 NOTE — Progress Notes (Signed)
Subjective:     Brandy Saunders is a 28 y.o. female here for a routine exam.  Current complaints: oligomenorrhea. Pt took the Provera with menses but, her cycles did not return to normal afterward.  Pt is not exercising regularly. She [facility] but, stopped going.    Gynecologic History Patient's last menstrual period was 12/16/2018 (exact date). Contraception: none Last Pap: 04/11/2016. Results were: normal Last mammogram: n/a.   Obstetric History OB History  Gravida Para Term Preterm AB Living  0 0 0 0 0 0  SAB TAB Ectopic Multiple Live Births  0 0 0 0 0    The following portions of the patient's history were reviewed and updated as appropriate: allergies, current medications, past family history, past medical history, past social history, past surgical history and problem list.  Review of Systems Pertinent items are noted in HPI.    Objective:  BP (!) 129/98   Pulse 73   Ht 5\' 5"  (1.651 m)   Wt (!) 437 lb (198.2 kg)   LMP 12/16/2018 (Exact Date)   BMI 72.72 kg/m      General Appearance:    Alert, cooperative, no distress, appears stated age  Head:    Normocephalic, without obvious abnormality, atraumatic  Eyes:    conjunctiva/corneas clear, EOM's intact, both eyes  Ears:    Normal external ear canals, both ears  Nose:   Nares normal, septum midline, mucosa normal, no drainage    or sinus tenderness  Throat:   Lips, mucosa, and tongue normal; teeth and gums normal  Neck:   Supple, symmetrical, trachea midline, no adenopathy;    thyroid:  no enlargement/tenderness/nodules  Back:     Symmetric, no curvature, ROM normal, no CVA tenderness  Lungs:     respirations unlabored  Chest Wall:    No tenderness or deformity   Heart:    Regular rate and rhythm  Breast Exam:    No tenderness, masses, or nipple abnormality  Abdomen:     Soft, non-tender, bowel sounds active all four quadrants,    no masses, no organomegaly  Genitalia:     Normal female without lesion, discharge or tenderness     Extremities:   Extremities normal, atraumatic, no cyanosis or edema  Pulses:   2+ and symmetric all extremities  Skin:   Skin color, texture, turgor normal, no rashes or lesions   Diagnoses and all orders for this visit:  Well woman exam with routine gynecological exam -     Cytology - PAP( [place]) -     medroxyPROGESTERone (PROVERA) 10 MG tablet; Take 1 tablet (10 mg total) by mouth daily. Use for ten days  Primary oligomenorrhea -     medroxyPROGESTERone (PROVERA) 10 MG tablet; Take 1 tablet (10 mg total) by mouth daily. Use for ten days  Obesity, morbid, BMI 50 or higher ([facility]) -     medroxyPROGESTERone (PROVERA) 10 MG tablet; Take 1 tablet (10 mg total) by mouth daily. Use for ten days  Reviewed exercise and diet. Pt to begin walking and logging on [employer] APP. She will also begin to limit her sugar intake.   rec 3 month f/u to reeval.   Brandy Saunders L. Brandy Saunders, M.D., Brandy Saunders

## 2019-05-14 NOTE — Patient Instructions (Signed)

## 2019-05-14 NOTE — Progress Notes (Signed)
Patient has not  had a period since April 2020. Patient interested in conceiving.  [facility]

## 2019-05-19 ENCOUNTER — Encounter: Payer: Self-pay | Admitting: Obstetrics & Gynecology

## 2019-05-19 LAB — CYTOLOGY - PAP
Chlamydia: NEGATIVE
Diagnosis: NEGATIVE
Molecular Disclaimer: NEGATIVE
Neisseria Gonorrhea: NEGATIVE

## 2019-05-23 DIAGNOSIS — Z20828 Contact with and (suspected) exposure to other viral communicable diseases: Secondary | ICD-10-CM | POA: Diagnosis not present

## 2019-06-12 ENCOUNTER — Other Ambulatory Visit: Payer: Self-pay

## 2019-06-12 ENCOUNTER — Ambulatory Visit: Payer: Self-pay | Admitting: Family Medicine

## 2019-06-12 ENCOUNTER — Ambulatory Visit (INDEPENDENT_AMBULATORY_CARE_PROVIDER_SITE_OTHER): Payer: BC Managed Care – PPO | Admitting: Family Medicine

## 2019-06-12 ENCOUNTER — Encounter: Payer: Self-pay | Admitting: Family Medicine

## 2019-06-12 DIAGNOSIS — I10 Essential (primary) hypertension: Secondary | ICD-10-CM

## 2019-06-12 DIAGNOSIS — F419 Anxiety disorder, unspecified: Secondary | ICD-10-CM | POA: Diagnosis not present

## 2019-06-12 MED ORDER — METOPROLOL SUCCINATE ER 100 MG PO TB24: ORAL_TABLET | ORAL | 3 refills | Status: DC

## 2019-06-12 NOTE — Progress Notes (Signed)
Patient ID: [name], female    DOB: [birth year]  Age: 28 y.o. MRN: [id number]    Subjective:  Subjective  HPI Brandy Saunders presents for f/u bp and weight.  She gained 10 lbs this month.  She has a new job which is sedentary.    Review of Systems  Constitutional: Negative for appetite change, diaphoresis, fatigue and unexpected weight change.  Eyes: Negative for pain, redness and visual disturbance.  Respiratory: Negative for cough, chest tightness, shortness of breath and wheezing.   Cardiovascular: Negative for chest pain, palpitations and leg swelling.  Endocrine: Negative for cold intolerance, heat intolerance, polydipsia, polyphagia and polyuria.  Genitourinary: Negative for difficulty urinating, dysuria and frequency.  Neurological: Negative for dizziness, light-headedness, numbness and headaches.    History Past Medical History:  Diagnosis Date  . Alcohol abuse   . Anxiety   . Asthma   . Back pain   . Binge eating   . Carpal tunnel syndrome, bilateral   . Chest pain   . Common migraine with intractable migraine 02/02/2017  . Constipation   . Depression   . Dyspnea   . Fatty liver   . GERD (gastroesophageal reflux disease)   . Hypertension   . Joint pain   . Leg edema   . Morbidly obese ([place])   . Palpitations   . PCOS (polycystic ovarian syndrome)   . Prediabetes   . Sleep apnea   . Vaginal Pap smear, abnormal   . Vitamin D deficiency     She has a past surgical history that includes Tonsilectomy, adenoidectomy, bilateral myringotomy and tubes (1994).   Her family history includes Alcoholism in her father; Diabetes in her maternal uncle; Drug abuse in her father; Heart disease in her maternal grandmother and mother; Hyperlipidemia in her mother; Hypertension in her mother; Obesity in her father and mother; Stroke in her father; Sudden death in her father.She reports that she has quit smoking. Her smoking use included cigarettes. She has never used  smokeless tobacco. She reports current alcohol use of about 3.0 standard drinks of alcohol per week. She reports that she does not use drugs.  Current Outpatient Medications on File Prior to Visit  Medication Sig Dispense Refill  . acetaminophen (TYLENOL) 500 MG tablet Take 500 mg by mouth every 6 (six) hours as needed.    . Ascorbic Acid (VITAMIN C) 1000 MG tablet Take 1,000 mg by mouth daily.    Marland Kitchen BIOTIN 5000 PO Take 10,000 mg by mouth daily.     . Cyanocobalamin (VITAMIN B 12 PO) Take 2,500 mcg by mouth daily.    . medroxyPROGESTERone (PROVERA) 10 MG tablet Take 1 tablet (10 mg total) by mouth daily. Use for ten days 10 tablet 2  . metFORMIN (GLUCOPHAGE) 500 MG tablet Take 1 tablet (500 mg total) by mouth daily with breakfast. 90 tablet 1  . Multiple Vitamins-Calcium (ONE-A-DAY WOMENS PO) Take 1 tablet by mouth daily.     . Oil Base OIL 1 Dose by Does not apply route at bedtime as needed. CBD oil per pt.     No current facility-administered medications on file prior to visit.      Objective:  Objective  Physical Exam Vitals signs and nursing note reviewed.  Constitutional:      Appearance: She is well-developed. She is morbidly obese.  HENT:     Head: Normocephalic and atraumatic.  Eyes:     Conjunctiva/sclera: Conjunctivae normal.  Neck:     Musculoskeletal:  Normal range of motion and neck supple.     Thyroid: No thyromegaly.     Vascular: No carotid bruit or JVD.  Cardiovascular:     Rate and Rhythm: Normal rate and regular rhythm.     Heart sounds: Normal heart sounds. No murmur.  Pulmonary:     Effort: Pulmonary effort is normal. No respiratory distress.     Breath sounds: Normal breath sounds. No wheezing or rales.  Chest:     Chest wall: No tenderness.  Neurological:     Mental Status: She is alert and oriented to person, place, and time.    BP 130/86 (BP Location: Left Arm, Patient Position: Sitting, Cuff Size: Large)   Pulse 79   Temp (!) 97.2 F (36.2 C)  (Temporal)   Resp 18   Ht 5\' 5"  (1.651 m)   Wt (!) 447 lb 3.2 oz (202.8 kg)   LMP 06/09/2019 (Exact Date)   SpO2 97%   BMI 74.42 kg/m  Wt Readings from Last 3 Encounters:  06/17/19 (!) 448 lb (203.2 kg)  06/12/19 (!) 447 lb 3.2 oz (202.8 kg)  05/14/19 (!) 437 lb (198.2 kg)     Lab Results  Component Value Date   WBC 4.5 03/10/2019   HGB 13.5 03/10/2019   HCT 41.7 03/10/2019   PLT 236.0 03/10/2019   GLUCOSE 100 (H) 03/10/2019   CHOL 153 03/10/2019   TRIG 104.0 03/10/2019   HDL 48.60 03/10/2019   LDLCALC 84 03/10/2019   ALT 29 03/10/2019   AST 16 03/10/2019   NA 139 03/10/2019   K 4.4 03/10/2019   CL 103 03/10/2019   CREATININE 0.68 03/10/2019   BUN 15 03/10/2019   CO2 25 03/10/2019   TSH 2.56 03/10/2019   HGBA1C 5.8 (H) 05/02/2018    Vas US Renal Artery Duplex  Result Date: 04/11/2019 ABDOMINAL VISCERAL Indications: Abnormal [place] Abd High Risk Factors: Hypertension, hyperlipidemia. Limitations: Air/bowel gas, obesity, patient discomfort and BMI 70. Performing Technologist: Cardell Peach RDCS, RVT  Examination Guidelines: A complete evaluation includes B-mode imaging, spectral Doppler, color Doppler, and power Doppler as needed of all accessible portions of each vessel. Bilateral testing is considered an integral part of a complete examination. Limited examinations for reoccurring indications may be performed as noted.  Duplex Findings: +------------------+--------+--------+--------------+ Right Renal ArteryPSV cm/sEDV cm/s   Comment     +------------------+--------+--------+--------------+ Origin                            Not Visualized +------------------+--------+--------+--------------+ Proximal             79      34                  +------------------+--------+--------+--------------+ Mid                  94      23                  +------------------+--------+--------+--------------+ Distal               57      15                   +------------------+--------+--------+--------------+ +-----------------+--------+--------+-------+ Left Renal ArteryPSV cm/sEDV cm/sComment +-----------------+--------+--------+-------+ Origin              68      19           +-----------------+--------+--------+-------+ Proximal  73      12           +-----------------+--------+--------+-------+ Mid                126      15           +-----------------+--------+--------+-------+ Distal             106      36           +-----------------+--------+--------+-------+ +------------+--------+--------+----+-----------+--------+--------+----+ Right KidneyPSV cm/sEDV cm/sRI  Left KidneyPSV cm/sEDV cm/sRI   +------------+--------+--------+----+-----------+--------+--------+----+ Upper Pole  28      11      0.61Upper Pole 45      17      0.63 +------------+--------+--------+----+-----------+--------+--------+----+ Mid         22      9       0.58Mid        53      22      0.59 +------------+--------+--------+----+-----------+--------+--------+----+ Lower Pole  28      10      0.63Lower Pole 42      15      0.16 +------------+--------+--------+----+-----------+--------+--------+----+ Hilar                           Hilar      60      16      0.74 +------------+--------+--------+----+-----------+--------+--------+----+ +------------------+-----+------------------+-----+ Right Kidney           Left Kidney             +------------------+-----+------------------+-----+ RAR                    RAR                     +------------------+-----+------------------+-----+ RAR (manual)      .71  RAR (manual)      .95   +------------------+-----+------------------+-----+ Cortex                 Cortex                  +------------------+-----+------------------+-----+ Cortex thickness       Corex thickness         +------------------+-----+------------------+-----+ Kidney length  (cm)12.22Kidney length (cm)13.72 +------------------+-----+------------------+-----+  Technically difficult study due to obesity Summary: Largest Aortic Diameter: 2.8 cm  Renal:  Right: Normal size right kidney. No evidence of right renal artery        stenosis. Left:  Normal size of left kidney. No evidence of left renal artery        stenosis. Mesenteric: Areas of limited visceral study include right renal artery, left renal artery and mesenteric arteries.  limited Study *See table(s) above for measurements and observations.  Diagnosing physician: Shirlee More MD  Electronically signed by Shirlee More MD on 04/11/2019 at 12:33:51 PM.    Final      Assessment & Plan:  Plan  I have discontinued Brandy Saunders's metoprolol succinate and metoprolol succinate. I am also having her maintain her Multiple Vitamins-Calcium (ONE-A-DAY WOMENS PO), BIOTIN 5000 PO, vitamin C, acetaminophen, Cyanocobalamin (VITAMIN B 12 PO), Oil Base, metFORMIN, and medroxyPROGESTERone.  Meds ordered this encounter  Medications  . DISCONTD: metoprolol succinate (TOPROL-XL) 100 MG 24 hr tablet    Sig: 1  po qd    Take with or immediately following a meal.    Dispense:  135 tablet  Refill:  3  . DISCONTD: metoprolol succinate (TOPROL-XL) 100 MG 24 hr tablet    Sig: 1  po qd    Take with or immediately following a meal.    Dispense:  135 tablet    Refill:  3    Problem List Items Addressed This Visit      Unprioritized   Essential hypertension    Well controlled, no changes to meds. Encouraged heart healthy diet such as the DASH diet and exercise as tolerated.       Relevant Orders   Comprehensive metabolic panel   Hemoglobin A1c   Lipid panel   Morbid obesity (HCC) - Primary   Relevant Orders   Amb Ref to Medical Weight Management   Comprehensive metabolic panel   Hemoglobin A1c   Insulin, random   Lipid panel      Follow-up: Return in about 3 months (around 09/12/2019), or if symptoms worsen or  fail to improve, for hypertension, hyperlipidemia.  Ann Held, DO

## 2019-06-12 NOTE — Patient Instructions (Signed)

## 2019-06-17 ENCOUNTER — Other Ambulatory Visit: Payer: Self-pay

## 2019-06-17 ENCOUNTER — Ambulatory Visit (INDEPENDENT_AMBULATORY_CARE_PROVIDER_SITE_OTHER): Payer: BC Managed Care – PPO | Admitting: Family Medicine

## 2019-06-17 ENCOUNTER — Encounter (INDEPENDENT_AMBULATORY_CARE_PROVIDER_SITE_OTHER): Payer: Self-pay | Admitting: Family Medicine

## 2019-06-17 VITALS — BP 134/81 | HR 75 | Temp 97.8°F | Ht 65.0 in | Wt >= 6400 oz

## 2019-06-17 DIAGNOSIS — Z2089 Contact with and (suspected) exposure to other communicable diseases: Secondary | ICD-10-CM

## 2019-06-17 DIAGNOSIS — E559 Vitamin D deficiency, unspecified: Secondary | ICD-10-CM

## 2019-06-17 DIAGNOSIS — R7303 Prediabetes: Secondary | ICD-10-CM | POA: Diagnosis not present

## 2019-06-17 DIAGNOSIS — Z9189 Other specified personal risk factors, not elsewhere classified: Secondary | ICD-10-CM | POA: Diagnosis not present

## 2019-06-17 DIAGNOSIS — R5383 Other fatigue: Secondary | ICD-10-CM

## 2019-06-17 DIAGNOSIS — Z6841 Body Mass Index (BMI) 40.0 and over, adult: Secondary | ICD-10-CM

## 2019-06-17 DIAGNOSIS — R0602 Shortness of breath: Secondary | ICD-10-CM | POA: Diagnosis not present

## 2019-06-17 DIAGNOSIS — E538 Deficiency of other specified B group vitamins: Secondary | ICD-10-CM | POA: Diagnosis not present

## 2019-06-17 DIAGNOSIS — Z1331 Encounter for screening for depression: Secondary | ICD-10-CM

## 2019-06-17 NOTE — Assessment & Plan Note (Signed)
Well controlled, no changes to meds. Encouraged heart healthy diet such as the DASH diet and exercise as tolerated.  °

## 2019-06-18 ENCOUNTER — Other Ambulatory Visit: Payer: BC Managed Care – PPO

## 2019-06-18 LAB — CBC WITH DIFFERENTIAL/PLATELET
Basophils Absolute: 0 10*3/uL (ref 0.0–0.2)
Basos: 0 %
EOS (ABSOLUTE): 0.1 10*3/uL (ref 0.0–0.4)
Eos: 2 %
Hematocrit: 42 % (ref 34.0–46.6)
Hemoglobin: 13.6 g/dL (ref 11.1–15.9)
Immature Grans (Abs): 0 10*3/uL (ref 0.0–0.1)
Immature Granulocytes: 0 %
Lymphocytes Absolute: 2.2 10*3/uL (ref 0.7–3.1)
Lymphs: 43 %
MCH: 27.9 pg (ref 26.6–33.0)
MCHC: 32.4 g/dL (ref 31.5–35.7)
MCV: 86 fL (ref 79–97)
Monocytes Absolute: 0.2 10*3/uL (ref 0.1–0.9)
Monocytes: 5 %
Neutrophils Absolute: 2.5 10*3/uL (ref 1.4–7.0)
Neutrophils: 50 %
Platelets: 272 10*3/uL (ref 150–450)
RBC: 4.88 x10E6/uL (ref 3.77–5.28)
RDW: 13.7 % (ref 11.7–15.4)
WBC: 5.1 10*3/uL (ref 3.4–10.8)

## 2019-06-18 LAB — COMPREHENSIVE METABOLIC PANEL
ALT: 35 IU/L — ABNORMAL HIGH (ref 0–32)
AST: 26 IU/L (ref 0–40)
Albumin/Globulin Ratio: 1.7 (ref 1.2–2.2)
Albumin: 4.2 g/dL (ref 3.9–5.0)
Alkaline Phosphatase: 64 IU/L (ref 39–117)
BUN/Creatinine Ratio: 18 (ref 9–23)
BUN: 10 mg/dL (ref 6–20)
Bilirubin Total: 0.5 mg/dL (ref 0.0–1.2)
CO2: 24 mmol/L (ref 20–29)
Calcium: 8.9 mg/dL (ref 8.7–10.2)
Chloride: 103 mmol/L (ref 96–106)
Creatinine, Ser: 0.56 mg/dL — ABNORMAL LOW (ref 0.57–1.00)
GFR calc Af Amer: 146 mL/min/{1.73_m2} (ref >59)
GFR calc non Af Amer: 126 mL/min/{1.73_m2} (ref >59)
Globulin, Total: 2.5 g/dL (ref 1.5–4.5)
Glucose: 100 mg/dL — ABNORMAL HIGH (ref 65–99)
Potassium: 4.4 mmol/L (ref 3.5–5.2)
Sodium: 139 mmol/L (ref 134–144)
Total Protein: 6.7 g/dL (ref 6.0–8.5)

## 2019-06-18 LAB — T4, FREE: Free T4: 1.34 ng/dL (ref 0.82–1.77)

## 2019-06-18 LAB — LIPID PANEL WITH LDL/HDL RATIO
Cholesterol, Total: 153 mg/dL (ref 100–199)
HDL: 52 mg/dL (ref >39)
LDL Chol Calc (NIH): 79 mg/dL (ref 0–99)
LDL/HDL Ratio: 1.5 ratio (ref 0.0–3.2)
Triglycerides: 127 mg/dL (ref 0–149)
VLDL Cholesterol Cal: 22 mg/dL (ref 5–40)

## 2019-06-18 LAB — VITAMIN B12: Vitamin B-12: >2000 pg/mL — ABNORMAL HIGH (ref 232–1245)

## 2019-06-18 LAB — HEMOGLOBIN A1C
Est. average glucose Bld gHb Est-mCnc: 120 mg/dL
Hgb A1c MFr Bld: 5.8 % — ABNORMAL HIGH (ref 4.8–5.6)

## 2019-06-18 LAB — T3: T3, Total: 140 ng/dL (ref 71–180)

## 2019-06-18 LAB — TSH: TSH: 2.05 u[IU]/mL (ref 0.450–4.500)

## 2019-06-18 LAB — VITAMIN D 25 HYDROXY (VIT D DEFICIENCY, FRACTURES): Vit D, 25-Hydroxy: 28.1 ng/mL — ABNORMAL LOW (ref 30.0–100.0)

## 2019-06-18 LAB — INSULIN, RANDOM: INSULIN: 35.9 u[IU]/mL — ABNORMAL HIGH (ref 2.6–24.9)

## 2019-06-18 LAB — FOLATE: Folate: 17.3 ng/mL (ref >3.0)

## 2019-06-18 NOTE — Progress Notes (Signed)
Office: [phone]  /  Fax: [phone]   HPI:   Chief Complaint: OBESITY  Brandy Saunders (MR# [id number]) is a 28 y.o. female who presents on 06/17/2019 for obesity evaluation and treatment. Current BMI is Body mass index is 74.55 kg/m. Brandy Saunders has struggled with obesity for years and has been unsuccessful in either losing weight or maintaining long term weight loss. Brandy Saunders attended our information session and states she is currently in the action stage of change and ready to dedicate time achieving and maintaining a healthier weight.   Brandy Saunders has been a patient of ours last year. She stopped the program due to insurance issues, but she has another job and better insurance, so she is ready to get back on track.  Brandy Saunders states she struggles with family and or coworkers weight loss sabotage her desired weight loss is 198 lbs she has been heavy most of  her life she started gaining weight as an adolescent her heaviest weight ever was 447 lbs she is a picky eater and doesn't like to eat healthier foods  she has significant food cravings issues  she snacks frequently in the evenings she skips meals frequently she is frequently drinking liquids with calories she frequently makes poor food choices she has problems with excessive hunger  she frequently eats larger portions than normal  she has binge eating behaviors she struggles with emotional eating    Fatigue Brandy Saunders feels her energy is lower than it should be. This has worsened with weight gain and has not worsened recently. Brandy Saunders admits to daytime somnolence and  admits to waking up still tired. Patient is at risk for obstructive sleep apnea. Brandy Saunders has a history of symptoms of daytime fatigue. Patient generally gets 6 hours of sleep per night, and states they generally have nightime awakenings and generally restful sleep. Snoring is present. Apneic episodes are present. Epworth Sleepiness Score is 12.  Dyspnea on  exertion Brandy Saunders notes increasing shortness of breath with exercising and seems to be worsening over time with weight gain. She notes getting out of breath sooner with activity than she used to. This has not gotten worse recently. Brandy Saunders denies orthopnea.  Pre-Diabetes Brandy Saunders has a diagnosis of pre-diabetes based on her elevated Hgb A1c and was informed this puts her at greater risk of developing diabetes. She is taking metformin currently and she is due for labs. She continues to work on diet and exercise to decrease risk of diabetes. She notes polyphagia.  At risk for diabetes Brandy Saunders is at higher than average risk for developing diabetes due to her obesity and pre-diabetes. She currently denies polyuria or polydipsia.  Vitamin D Deficiency Brandy Saunders has a diagnosis of vitamin D deficiency. She is currently taking multivitamins, and has no recent labs. She notes fatigue and denies nausea, vomiting or muscle weakness.  Vitamin B12 Deficiency Brandy Saunders has a diagnosis of B12 insufficiency. She is on OTC Vit B12, and she has no recent labs. She notes fatigue. She is not a vegetarian and does not have a previous diagnosis of pernicious anemia. She does not have a history of weight loss surgery.   Depression Screen Brandy Saunders's Food and Mood (modified PHQ-9) score was  Depression screen PHQ 2/9 06/17/2019  Decreased Interest 3  Down, Depressed, Hopeless 3  PHQ - 2 Score 6  Altered sleeping 3  Tired, decreased energy 3  Change in appetite 3  Feeling bad or failure about yourself  3  Trouble concentrating 3  Moving slowly or fidgety/restless  2  Suicidal thoughts 1  PHQ-9 Score 24  Difficult doing work/chores Very difficult    ASSESSMENT AND PLAN:  Other fatigue - Plan: EKG 12-Lead, CBC with Differential/Platelet, Folate, T3, T4, free, TSH  SOB (shortness of breath) on exertion - Plan: Lipid Panel With LDL/HDL Ratio  Prediabetes - Plan: Comprehensive metabolic panel, Hemoglobin  A1c, Insulin, random  Vitamin D deficiency - Plan: VITAMIN D 25 Hydroxy (Vit-D Deficiency, Fractures)  B12 deficiency - Plan: Vitamin B12  Depression screening  At risk for diabetes mellitus  Class 3 severe obesity with serious comorbidity and body mass index (BMI) greater than or equal to 70 in adult, unspecified obesity type (HCC)  PLAN:  Fatigue Brandy Saunders was informed that her fatigue may be related to obesity, depression or many other causes. Labs will be ordered, and in the meanwhile Brandy Saunders has agreed to work on diet, exercise and weight loss to help with fatigue. Proper sleep hygiene was discussed including the need for 7-8 hours of quality sleep each night. A sleep study was not ordered based on symptoms and Epworth score.  Dyspnea on exertion Brandy Saunders's shortness of breath appears to be obesity related and exercise induced. She has agreed to work on weight loss and gradually increase exercise to treat her exercise induced shortness of breath. If Brandy Saunders follows our instructions and loses weight without improvement of her shortness of breath, we will plan to refer to pulmonology. We will monitor this condition regularly. Brandy Saunders agrees to this plan.  Pre-Diabetes Brandy Saunders will continue to work on weight loss, exercise, and decreasing simple carbohydrates in her diet to help decrease the risk of diabetes. We dicussed metformin including benefits and risks. She was informed that eating too many simple carbohydrates or too many calories at one sitting increases the likelihood of GI side effects. We will check labs today. Brandy Saunders agrees to follow up with [place] as directed to monitor her progress.  Diabetes risk counseling Brandy Saunders was given extended (30 minutes) diabetes prevention counseling today. She is 28 y.o. female and has risk factors for diabetes including obesity and pre-diabetes. We discussed intensive lifestyle modifications today with an emphasis on weight loss as well as  increasing exercise and decreasing simple carbohydrates in her diet.  Vitamin D Deficiency Brandy Saunders was informed that low vitamin D levels contributes to fatigue and are associated with obesity, breast, and colon cancer. She will follow up for routine testing of vitamin D, at least 2-3 times per year. She was informed of the risk of over-replacement of vitamin D and agrees to not increase her dose unless she discusses this with [place] first. We will check labs today. Brandy Saunders agrees to follow up with our clinic in 2 weeks.  Vitamin B12 Deficiency Brandy Saunders will work on increasing B12 rich foods in her diet. B12 supplementation was not prescribed today. We will check labs today. Brandy Saunders agrees to follow up with our clinic in 2 weeks.  Depression Screen Brandy Saunders had a strongly positive depression screening. Depression is commonly associated with obesity and often results in emotional eating behaviors. We will monitor this closely and work on CBT to help improve the non-hunger eating patterns. Referral to Psychology may be required if no improvement is seen as she continues in our clinic.  Obesity Brandy Saunders is currently in the action stage of change and her goal is to continue with weight loss efforts She has agreed to keep a food journal with 1600-2000 calories and 100+ grams of protein daily Brandy Saunders has been instructed to  work up to a goal of 150 minutes of combined cardio and strengthening exercise per week for weight loss and overall health benefits. We discussed the following Behavioral Modification Strategies today: increasing lean protein intake, decrease eating out, and keep a strict food journal  Brandy Saunders has agreed to follow up with our clinic in 2 weeks. She was informed of the importance of frequent follow up visits to maximize her success with intensive lifestyle modifications for her multiple health conditions. She was informed we would discuss her lab results at her next visit unless there  is a critical issue that needs to be addressed sooner. Brandy Saunders agreed to keep her next visit at the agreed upon time to discuss these results.  ALLERGIES: Allergies  Allergen Reactions  . Ibuprofen Diarrhea  . Meloxicam     Pt stated, "It made my emotions out of wack; it made my left hand numb"    MEDICATIONS: Current Outpatient Medications on File Prior to Visit  Medication Sig Dispense Refill  . acetaminophen (TYLENOL) 500 MG tablet Take 500 mg by mouth every 6 (six) hours as needed.    . Ascorbic Acid (VITAMIN C) 1000 MG tablet Take 1,000 mg by mouth daily.    Marland Kitchen BIOTIN 5000 PO Take 10,000 mg by mouth daily.     . Cyanocobalamin (VITAMIN B 12 PO) Take 2,500 mcg by mouth daily.    . medroxyPROGESTERone (PROVERA) 10 MG tablet Take 1 tablet (10 mg total) by mouth daily. Use for ten days 10 tablet 2  . metFORMIN (GLUCOPHAGE) 500 MG tablet Take 1 tablet (500 mg total) by mouth daily with breakfast. 90 tablet 1  . Multiple Vitamins-Calcium (ONE-A-DAY WOMENS PO) Take 1 tablet by mouth daily.     . Oil Base OIL 1 Dose by Does not apply route at bedtime as needed. CBD oil per pt.     No current facility-administered medications on file prior to visit.     PAST MEDICAL HISTORY: Past Medical History:  Diagnosis Date  . Alcohol abuse   . Anxiety   . Asthma   . Back pain   . Binge eating   . Carpal tunnel syndrome, bilateral   . Chest pain   . Common migraine with intractable migraine 02/02/2017  . Constipation   . Depression   . Dyspnea   . Fatty liver   . GERD (gastroesophageal reflux disease)   . Hypertension   . Joint pain   . Leg edema   . Morbidly obese ([place])   . Palpitations   . PCOS (polycystic ovarian syndrome)   . Prediabetes   . Sleep apnea   . Vaginal Pap smear, abnormal   . Vitamin D deficiency     PAST SURGICAL HISTORY: Past Surgical History:  Procedure Laterality Date  . TONSILECTOMY, ADENOIDECTOMY, BILATERAL MYRINGOTOMY AND TUBES  1994    SOCIAL HISTORY:  Social History   Tobacco Use  . Smoking status: Former Smoker    Types: Cigarettes  . Smokeless tobacco: Never Used  Substance Use Topics  . Alcohol use: Yes    Alcohol/week: 3.0 standard drinks    Types: 3 Glasses of wine per week  . Drug use: No    Types: Marijuana    Comment: once every 6 months, taking CBD    FAMILY HISTORY: Family History  Problem Relation Age of Onset  . Hypertension Mother   . Hyperlipidemia Mother   . Obesity Mother   . Heart disease Mother   . Stroke Father   .  Obesity Father   . Alcoholism Father   . Drug abuse Father   . Sudden death Father   . Diabetes Maternal Uncle   . Heart disease Maternal Grandmother   . Colon cancer Neg Hx     ROS: Review of Systems  Constitutional: Positive for chills, fever and malaise/fatigue. Negative for weight loss.       + Trouble sleeping  HENT: Positive for tinnitus.        + Dry mouth  Eyes: Positive for pain.  Respiratory: Positive for shortness of breath (with exertion) and wheezing.   Cardiovascular: Negative for orthopnea.       + Difficulty breathing while lying down  Gastrointestinal: Positive for heartburn. Negative for nausea and vomiting.  Genitourinary: Negative for frequency.  Musculoskeletal:       Negative muscle weakness  Skin: Positive for itching.       + Dryness  Neurological: Positive for weakness and headaches.  Endo/Heme/Allergies: Negative for polydipsia.       Positive polyphagia  Psychiatric/Behavioral: Positive for depression. Negative for suicidal ideas. The patient is nervous/anxious.        + Stress    PHYSICAL EXAM: Blood pressure 134/81, pulse 75, temperature 97.8 F (36.6 C), temperature source Oral, height 5\' 5"  (1.651 m), weight (!) 448 lb (203.2 kg), last menstrual period 06/09/2019, SpO2 97 %. Body mass index is 74.55 kg/m. Physical Exam Vitals signs reviewed.  Constitutional:      Appearance: Normal appearance. She is obese.  HENT:     Head: Normocephalic  and atraumatic.     Nose: Nose normal.  Eyes:     General: No scleral icterus.    Extraocular Movements: Extraocular movements intact.  Neck:     Musculoskeletal: Normal range of motion and neck supple.     Comments: No thyromegaly present Cardiovascular:     Rate and Rhythm: Normal rate and regular rhythm.     Pulses: Normal pulses.     Heart sounds: Normal heart sounds.  Pulmonary:     Effort: Pulmonary effort is normal. No respiratory distress.     Breath sounds: Normal breath sounds.  Abdominal:     Palpations: Abdomen is soft.     Tenderness: There is no abdominal tenderness.     Comments: + Obesity  Musculoskeletal: Normal range of motion.     Right lower leg: No edema.     Left lower leg: No edema.  Skin:    General: Skin is warm and dry.  Neurological:     Mental Status: She is alert and oriented to person, place, and time.     Coordination: Coordination normal.     RECENT LABS AND TESTS: BMET    Component Value Date/Time   NA 139 06/17/2019 0950   K 4.4 06/17/2019 0950   CL 103 06/17/2019 0950   CO2 24 06/17/2019 0950   GLUCOSE 100 (H) 06/17/2019 0950   GLUCOSE 100 (H) 03/10/2019 0840   BUN 10 06/17/2019 0950   CREATININE 0.56 (L) 06/17/2019 0950   CREATININE 0.67 09/28/2014 1146   CALCIUM 8.9 06/17/2019 0950   GFRNONAA 126 06/17/2019 0950   GFRAA 146 06/17/2019 0950   Lab Results  Component Value Date   HGBA1C 5.8 (H) 06/17/2019   Lab Results  Component Value Date   INSULIN 35.9 (H) 06/17/2019   CBC    Component Value Date/Time   WBC 5.1 06/17/2019 0950   WBC 4.5 03/10/2019 0840   RBC 4.88 06/17/2019 0950  RBC 4.92 03/10/2019 0840   HGB 13.6 06/17/2019 0950   HCT 42.0 06/17/2019 0950   PLT 272 06/17/2019 0950   MCV 86 06/17/2019 0950   MCH 27.9 06/17/2019 0950   MCH 28.0 12/03/2018 1440   MCHC 32.4 06/17/2019 0950   MCHC 32.4 03/10/2019 0840   RDW 13.7 06/17/2019 0950   LYMPHSABS 2.2 06/17/2019 0950   MONOABS 0.2 03/10/2019 0840    EOSABS 0.1 06/17/2019 0950   BASOSABS 0.0 06/17/2019 0950   Iron/TIBC/Ferritin/ %Sat No results found for: IRON, TIBC, FERRITIN, IRONPCTSAT Lipid Panel     Component Value Date/Time   CHOL 153 06/17/2019 0950   TRIG 127 06/17/2019 0950   HDL 52 06/17/2019 0950   CHOLHDL 3 03/10/2019 0840   VLDL 20.8 03/10/2019 0840   LDLCALC 79 06/17/2019 0950   Hepatic Function Panel     Component Value Date/Time   PROT 6.7 06/17/2019 0950   ALBUMIN 4.2 06/17/2019 0950   AST 26 06/17/2019 0950   ALT 35 (H) 06/17/2019 0950   ALKPHOS 64 06/17/2019 0950   BILITOT 0.5 06/17/2019 0950   BILIDIR 0.1 09/28/2014 1146   IBILI 0.4 09/28/2014 1146      Component Value Date/Time   TSH 2.050 06/17/2019 0950   Vitamin D No recent labs  ECG  shows NSR with a rate of 87 BPM INDIRECT CALORIMETER done today shows a VO2 of 378 and a REE of 2629. Her calculated basal metabolic rate is [id number] thus her basal metabolic rate is better than expected.       OBESITY BEHAVIORAL INTERVENTION VISIT  Today's visit was # 16   Starting weight: 447 lbs Starting date: 10/11/17 Today's weight : 448 lbs Today's date: 06/17/2019 Total lbs lost to date: 0    ASK: We discussed the diagnosis of obesity with Brandy Saunders today and Lailah agreed to give [place] permission to discuss obesity behavioral modification therapy today.  ASSESS: Charmane has the diagnosis of obesity and her BMI today is 74.55 Anshika is in the action stage of change   ADVISE: Nikolina was educated on the multiple health risks of obesity as well as the benefit of weight loss to improve her health. She was advised of the need for long term treatment and the importance of lifestyle modifications to improve her current health and to decrease her risk of future health problems.  AGREE: Multiple dietary modification options and treatment options were discussed and  [place] agreed to follow the recommendations documented in the above  note.  ARRANGE: Isatu was educated on the importance of frequent visits to treat obesity as outlined per CMS and USPSTF guidelines and agreed to schedule her next follow up appointment today.   I, Trixie Dredge, am acting as transcriptionist for Dennard Nip, MD   I have reviewed the above documentation for accuracy and completeness, and I agree with the above. -Dennard Nip, MD

## 2019-06-26 DIAGNOSIS — F419 Anxiety disorder, unspecified: Secondary | ICD-10-CM | POA: Diagnosis not present

## 2019-07-02 ENCOUNTER — Other Ambulatory Visit: Payer: Self-pay

## 2019-07-02 ENCOUNTER — Ambulatory Visit (INDEPENDENT_AMBULATORY_CARE_PROVIDER_SITE_OTHER): Payer: BC Managed Care – PPO | Admitting: Family Medicine

## 2019-07-02 VITALS — BP 117/76 | HR 81 | Temp 98.0°F | Ht 65.0 in | Wt >= 6400 oz

## 2019-07-02 DIAGNOSIS — E559 Vitamin D deficiency, unspecified: Secondary | ICD-10-CM

## 2019-07-02 DIAGNOSIS — Z9189 Other specified personal risk factors, not elsewhere classified: Secondary | ICD-10-CM

## 2019-07-02 DIAGNOSIS — R7989 Other specified abnormal findings of blood chemistry: Secondary | ICD-10-CM | POA: Diagnosis not present

## 2019-07-02 DIAGNOSIS — R7303 Prediabetes: Secondary | ICD-10-CM

## 2019-07-02 DIAGNOSIS — Z6841 Body Mass Index (BMI) 40.0 and over, adult: Secondary | ICD-10-CM

## 2019-07-02 MED ORDER — VITAMIN D (ERGOCALCIFEROL) 1.25 MG (50000 UNIT) PO CAPS: 50000.0000 [IU] | ORAL_CAPSULE | ORAL | 0 refills | Status: DC

## 2019-07-03 NOTE — Progress Notes (Signed)
Office: [phone]  /  Fax: [phone]   HPI:   Chief Complaint: OBESITY Brandy Saunders is here to discuss her progress with her obesity treatment plan. She is on the keep a food journal with 1600-2000 calories and 100+ grams of protein daily and is following her eating plan approximately 90 % of the time. She states she is walking the dog. Brandy Saunders has done well with journaling. She met her protein goals, but used protein supplements and her sugar intake was approximately 75-100 grams daily, which was 2 times her goal.  Her weight is (!) 439 lb (199.1 kg) today and has had a weight loss of 9 pounds over a period of 2 weeks since her last visit. She has lost 8 lbs since starting treatment with [place].  Elevated LFTs Brandy Saunders has a diagnosis of elevated ALT. Her BMI is over 40. She denies abdominal pain or jaundice and has never been told of any liver problems in the past. She denies excessive alcohol intake.  Vitamin D Deficiency Brandy Saunders has a diagnosis of vitamin D deficiency. Her Vit D level is not yet controlled, and she is not on Vit D anymore. She denies nausea, vomiting or muscle weakness.  Pre-Diabetes Brandy Saunders has a diagnosis of pre-diabetes based on her elevated Hgb A1c and was informed this puts her at greater risk of developing diabetes. Her blood sugar and A1c are still elevated, and fasting insulin is very high. She notes her polyphagia has improved but not yet controlled on metformin. She continues to work on diet and exercise to decrease risk of diabetes. She denies nausea or hypoglycemia.  At risk for diabetes Brandy Saunders is at higher than average risk for developing diabetes due to her obesity and pre-diabetes. She currently denies polyuria or polydipsia.  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 Brandy Saunders was educated on her risk of developing NASH or even liver failure and th only proven treatment for NAFLD was weight loss. Brandy Saunders agreed to continue with her weight loss efforts with healthier diet and exercise as an essential part of her treatment plan. We will recheck labs in 3 months.  Vitamin D Deficiency Brandy Saunders was informed that low vitamin D levels contributes to fatigue and are associated with obesity, breast, and colon cancer. Brandy Saunders agrees to start prescription Vit D 50,000 IU every week #4 with no refills. She will follow up for routine testing of vitamin D, at least 2-3 times per year. She was informed of the risk of over-replacement of vitamin D and agrees to not increase her dose unless she discusses this with [place] first. Brandy Saunders agrees to follow up with our clinic in 2 to 3 weeks.  Pre-Diabetes Brandy Saunders will continue to work on weight loss, diet, exercise, and decreasing simple carbohydrates in her diet to help decrease the risk of diabetes. We dicussed metformin including benefits and risks. She was informed that eating too many simple carbohydrates or too many calories at one sitting increases the likelihood of GI side effects. Brandy Saunders agrees to continue taking metformin, and we will recheck labs in 3 months. Brandy Saunders  agrees to follow up with our clinic in 2 to 3 weeks as directed to monitor her progress.  Diabetes risk counseling Brandy Saunders was given extended (30 minutes) diabetes prevention counseling today. She is 28 y.o. female and has risk factors for diabetes including obesity and pre-diabetes. We discussed intensive lifestyle modifications today with an emphasis on weight loss as well as increasing exercise and decreasing  simple carbohydrates in her diet.  Obesity Brandy Saunders is currently in the action stage of change. As such, her goal is to continue with weight loss efforts She has agreed to keep a food journal with 1600-2000 calories and 100+ grams of protein daily Brandy Saunders has been instructed to work up to a goal of 150 minutes of combined cardio and strengthening exercise per week for weight loss and overall health benefits. We discussed the following Behavioral Modification Strategies today: increasing lean protein intake, decreasing simple carbohydrates  and work on meal planning and easy cooking plans   Brandy Saunders has agreed to follow up with our clinic in 2 to 3 weeks. She was informed of the importance of frequent follow up visits to maximize her success with intensive lifestyle modifications for her multiple health conditions.  ALLERGIES: Allergies  Allergen Reactions  . Ibuprofen Diarrhea  . Meloxicam     Pt stated, "It made my emotions out of wack; it made my left hand numb"    MEDICATIONS: Current Outpatient Medications on File Prior to Visit  Medication Sig Dispense Refill  . acetaminophen (TYLENOL) 500 MG tablet Take 500 mg by mouth every 6 (six) hours as needed.    . Ascorbic Acid (VITAMIN C) 1000 MG tablet Take 1,000 mg by mouth daily.    Marland Kitchen BIOTIN 5000 PO Take 10,000 mg by mouth daily.     . Cyanocobalamin (VITAMIN B 12 PO) Take 2,500 mcg by mouth daily.    . medroxyPROGESTERone (PROVERA) 10 MG tablet Take 1 tablet (10 mg total) by mouth daily. Use for ten days 10 tablet 2  . metFORMIN (GLUCOPHAGE) 500 MG tablet Take 1 tablet (500 mg total) by mouth daily with breakfast. 90 tablet 1  . Multiple Vitamins-Calcium (ONE-A-DAY WOMENS PO) Take 1 tablet by mouth daily.     . Oil Base OIL 1 Dose by Does not apply route at bedtime as needed. CBD oil per pt.     No current facility-administered medications on file prior to visit.     PAST MEDICAL HISTORY: Past Medical History:  Diagnosis Date   . Alcohol abuse   . Anxiety   . Asthma   . Back pain   . Binge eating   . Carpal tunnel syndrome, bilateral   . Chest pain   . Common migraine with intractable migraine 02/02/2017  . Constipation   . Depression   . Dyspnea   . Fatty liver   . GERD (gastroesophageal reflux disease)   . Hypertension   . Joint pain   . Leg edema   . Morbidly obese ([place])   . Palpitations   . PCOS (polycystic ovarian syndrome)   . Prediabetes   . Sleep apnea   . Vaginal Pap smear, abnormal   . Vitamin D deficiency     PAST SURGICAL HISTORY: Past Surgical History:  Procedure Laterality Date  . TONSILECTOMY, ADENOIDECTOMY, BILATERAL MYRINGOTOMY AND TUBES  1994    SOCIAL HISTORY: Social History   Tobacco Use  . Smoking status: Former Smoker    Types: Cigarettes  . Smokeless tobacco: Never Used  Substance Use  Topics  . Alcohol use: Yes    Alcohol/week: 3.0 standard drinks    Types: 3 Glasses of wine per week  . Drug use: No    Types: Marijuana    Comment: once every 6 months, taking CBD    FAMILY HISTORY: Family History  Problem Relation Age of Onset  . Hypertension Mother   . Hyperlipidemia Mother   . Obesity Mother   . Heart disease Mother   . Stroke Father   . Obesity Father   . Alcoholism Father   . Drug abuse Father   . Sudden death Father   . Diabetes Maternal Uncle   . Heart disease Maternal Grandmother   . Colon cancer Neg Hx     ROS: Review of Systems  Constitutional: Positive for weight loss.  Eyes:       Negative jaundice  Gastrointestinal: Negative for abdominal pain, nausea and vomiting.  Genitourinary: Negative for frequency.  Endo/Heme/Allergies: Negative for polydipsia.       Positive polyphagia Negative hypoglycemia    PHYSICAL EXAM: Blood pressure 117/76, pulse 81, temperature 98 F (36.7 C), temperature source Oral, height 5' 5"  (1.651 m), weight (!) 439 lb (199.1 kg), last menstrual period 06/09/2019, SpO2 97 %. Body mass index is 73.05 kg/m.  Physical Exam Vitals signs reviewed.  Constitutional:      Appearance: Normal appearance. She is obese.  Cardiovascular:     Rate and Rhythm: Normal rate.     Pulses: Normal pulses.  Pulmonary:     Effort: Pulmonary effort is normal.     Breath sounds: Normal breath sounds.  Musculoskeletal: Normal range of motion.  Skin:    General: Skin is warm and dry.  Neurological:     Mental Status: She is alert and oriented to person, place, and time.  Psychiatric:        Mood and Affect: Mood normal.        Behavior: Behavior normal.     RECENT LABS AND TESTS: BMET    Component Value Date/Time   NA 139 06/17/2019 0950   K 4.4 06/17/2019 0950   CL 103 06/17/2019 0950   CO2 24 06/17/2019 0950   GLUCOSE 100 (H) 06/17/2019 0950   GLUCOSE 100 (H) 03/10/2019 0840   BUN 10 06/17/2019 0950   CREATININE 0.56 (L) 06/17/2019 0950   CREATININE 0.67 09/28/2014 1146   CALCIUM 8.9 06/17/2019 0950   GFRNONAA 126 06/17/2019 0950   GFRAA 146 06/17/2019 0950   Lab Results  Component Value Date   HGBA1C 5.8 (H) 06/17/2019   HGBA1C 5.8 (H) 05/02/2018   HGBA1C 5.7 (H) 01/17/2018   HGBA1C 6.0 (H) 10/11/2017   HGBA1C 6.1 09/13/2017   Lab Results  Component Value Date   INSULIN 35.9 (H) 06/17/2019   INSULIN 30.3 (H) 05/02/2018   INSULIN 39.5 (H) 01/17/2018   INSULIN 73.6 (H) 10/11/2017   CBC    Component Value Date/Time   WBC 5.1 06/17/2019 0950   WBC 4.5 03/10/2019 0840   RBC 4.88 06/17/2019 0950   RBC 4.92 03/10/2019 0840   HGB 13.6 06/17/2019 0950   HCT 42.0 06/17/2019 0950   PLT 272 06/17/2019 0950   MCV 86 06/17/2019 0950   MCH 27.9 06/17/2019 0950   MCH 28.0 12/03/2018 1440   MCHC 32.4 06/17/2019 0950   MCHC 32.4 03/10/2019 0840   RDW 13.7 06/17/2019 0950   LYMPHSABS 2.2 06/17/2019 0950   MONOABS 0.2 03/10/2019 0840   EOSABS 0.1 06/17/2019 0950  BASOSABS 0.0 06/17/2019 0950   Iron/TIBC/Ferritin/ %Sat No results found for: IRON, TIBC, FERRITIN, IRONPCTSAT Lipid Panel      Component Value Date/Time   CHOL 153 06/17/2019 0950   TRIG 127 06/17/2019 0950   HDL 52 06/17/2019 0950   CHOLHDL 3 03/10/2019 0840   VLDL 20.8 03/10/2019 0840   LDLCALC 79 06/17/2019 0950   Hepatic Function Panel     Component Value Date/Time   PROT 6.7 06/17/2019 0950   ALBUMIN 4.2 06/17/2019 0950   AST 26 06/17/2019 0950   ALT 35 (H) 06/17/2019 0950   ALKPHOS 64 06/17/2019 0950   BILITOT 0.5 06/17/2019 0950   BILIDIR 0.1 09/28/2014 1146   IBILI 0.4 09/28/2014 1146      Component Value Date/Time   TSH 2.050 06/17/2019 0950   TSH 2.56 03/10/2019 0840   TSH 2.18 05/07/2018 1605      OBESITY BEHAVIORAL INTERVENTION VISIT  Today's visit was # 17   Starting weight: 447 lbs Starting date: 10/11/17 Today's weight : 439 lbs Today's date: 07/02/2019 Total lbs lost to date: 8    ASK: We discussed the diagnosis of obesity with [name] today and Shateria agreed to give [place] permission to discuss obesity behavioral modification therapy today.  ASSESS: Judeen has the diagnosis of obesity and her BMI today is 73.05 Maddyn is in the action stage of change   ADVISE: Verlean was educated on the multiple health risks of obesity as well as the benefit of weight loss to improve her health. She was advised of the need for long term treatment and the importance of lifestyle modifications to improve her current health and to decrease her risk of future health problems.  AGREE: Multiple dietary modification options and treatment options were discussed and  Randye agreed to follow the recommendations documented in the above note.  ARRANGE: Anette was educated on the importance of frequent visits to treat obesity as outlined per CMS and USPSTF guidelines and agreed to schedule her next follow up appointment today.  I, Trixie Dredge, am acting as transcriptionist for Dennard Nip, MD  I have reviewed the above documentation for accuracy and completeness, and I  agree with the above. -Dennard Nip, MD

## 2019-07-10 DIAGNOSIS — F419 Anxiety disorder, unspecified: Secondary | ICD-10-CM | POA: Diagnosis not present

## 2019-07-17 ENCOUNTER — Encounter (INDEPENDENT_AMBULATORY_CARE_PROVIDER_SITE_OTHER): Payer: Self-pay | Admitting: Family Medicine

## 2019-07-17 ENCOUNTER — Other Ambulatory Visit: Payer: Self-pay

## 2019-07-17 ENCOUNTER — Ambulatory Visit (INDEPENDENT_AMBULATORY_CARE_PROVIDER_SITE_OTHER): Payer: BC Managed Care – PPO | Admitting: Family Medicine

## 2019-07-17 VITALS — BP 106/66 | HR 74 | Temp 97.5°F | Ht 65.0 in | Wt >= 6400 oz

## 2019-07-17 DIAGNOSIS — I10 Essential (primary) hypertension: Secondary | ICD-10-CM

## 2019-07-17 DIAGNOSIS — Z6841 Body Mass Index (BMI) 40.0 and over, adult: Secondary | ICD-10-CM

## 2019-07-17 DIAGNOSIS — F3289 Other specified depressive episodes: Secondary | ICD-10-CM | POA: Diagnosis not present

## 2019-07-17 DIAGNOSIS — Z9189 Other specified personal risk factors, not elsewhere classified: Secondary | ICD-10-CM

## 2019-07-17 MED ORDER — BUPROPION HCL ER (SR) 150 MG PO TB12: 150.0000 mg | ORAL_TABLET | ORAL | 0 refills | Status: DC

## 2019-07-20 ENCOUNTER — Encounter (INDEPENDENT_AMBULATORY_CARE_PROVIDER_SITE_OTHER): Payer: Self-pay | Admitting: Family Medicine

## 2019-07-21 NOTE — Telephone Encounter (Signed)
Please advise 

## 2019-07-22 DIAGNOSIS — F419 Anxiety disorder, unspecified: Secondary | ICD-10-CM | POA: Diagnosis not present

## 2019-07-22 NOTE — Progress Notes (Signed)
Office: [phone]  /  Fax: [phone]   HPI:   Chief Complaint: OBESITY Brandy Saunders is here to discuss her progress with her obesity treatment plan. Brandy Saunders is on the keep a food journal with 1600-2000 calories and 100+ grams of protein daily and is following her eating plan approximately 85 % of the time. Brandy Saunders states Brandy Saunders is walking the dog for 45 minutes 7 times per week. Brandy Saunders is doing well with diet and weight loss. Her hunger is controlled, but Brandy Saunders is struggling with temptations, comfort and stress eating.  Her weight is (!) 437 lb (198.2 kg) today and has had a weight loss of 2 pounds over a period of 2 weeks since her last visit. Brandy Saunders has lost 10 lbs since starting treatment with [place].  Hypertension Brandy Saunders is a 28 y.o. female with hypertension. Brandy Saunders blood pressure is well controlled. Brandy Saunders denies signs of hypotension. Brandy Saunders is working on diet and weight loss to help control her blood pressure with the goal of decreasing her risk of heart attack and stroke.   At risk for cardiovascular disease Brandy Saunders is at a higher than average risk for cardiovascular disease due to obesity and hypertension. Brandy Saunders currently denies any chest pain.  Depression with Emotional Eating Behaviors Brandy Saunders is struggles with emotional eating. Brandy Saunders notes increase home and work stress, and Brandy Saunders is feeling overwhelmed at times. Brandy Saunders is using food for comfort to the extent that it is negatively impacting her health. Brandy Saunders often snacks when Brandy Saunders is not hungry. Brandy Saunders sometimes feels Brandy Saunders is out of control and then feels guilty that Brandy Saunders made poor food choices. Brandy Saunders has been working on behavior modification techniques to help reduce her emotional eating and has been somewhat successful. Brandy Saunders shows no sign of suicidal or homicidal ideations.  Depression screen Brandy Saunders 07/23/2018 07/04/2018 10/11/2017 05/09/2017  Decreased Interest 3 1 2 3  0  Down, Depressed, Hopeless 3 1 2 3  0  PHQ - 2 Score 6 2 4 6  0   Altered sleeping 3 2 2 2  -  Tired, decreased energy 3 2 2 3  -  Change in appetite 3 1 2 2  -  Feeling bad or failure about yourself  3 1 2 3  -  Trouble concentrating 3 2 1 3  -  Moving slowly or fidgety/restless 2 0 1 2 -  Suicidal thoughts 1 0 1 2 -  PHQ-9 Score 24 10 15 23  -  Difficult doing work/chores Very difficult - - Extremely dIfficult -    ASSESSMENT AND PLAN:  Essential hypertension  Other depression,with emotional eating - Plan: buPROPion (WELLBUTRIN SR) 150 MG 12 hr tablet  At risk for heart disease  Class 3 severe obesity with serious comorbidity and body mass index (BMI) greater than or equal to 70 in adult, unspecified obesity type (HCC)  PLAN:  Hypertension We discussed sodium restriction, working on healthy weight loss, and a regular exercise program as the means to achieve improved blood pressure control. Telecia agreed with this plan and agreed to follow up as directed. We will continue to monitor her blood pressure as well as her progress with the above lifestyle modifications. Ritchie will with diet and exercise, and continue her medications. Brandy Saunders will watch for signs of hypotension as Brandy Saunders continues her lifestyle modifications. Brandy Saunders agrees to follow up with our clinic in 3 weeks.  Cardiovascular risk counseling Brandy Saunders was given extended (15 minutes) coronary artery disease prevention counseling today. Brandy Saunders is 28 y.o. female and has risk factors  for heart disease including obesity and hypertension. We discussed intensive lifestyle modifications today with an emphasis on specific weight loss instructions and strategies. Pt was also informed of the importance of increasing exercise and decreasing saturated fats to help prevent heart disease.  Depression with Emotional Eating Behaviors We discussed behavior modification techniques today to help Brandy Saunders deal with her emotional eating and depression. Yvett agrees to start Wellbutrin SR 150 mg PO q AM #30 with no  refills. Kaleia agrees to follow up with our clinic in 3 weeks.  Obesity Brandy Saunders is currently in the action stage of change. As such, her goal is to continue with weight loss efforts Brandy Saunders has agreed to keep a food journal with 1600-2000 calories and 100+ grams of protein daily Brandy Saunders has been instructed to work up to a goal of 150 minutes of combined cardio and strengthening exercise per week for weight loss and overall health benefits. We discussed the following Behavioral Modification Strategies today: holiday eating strategies  and emotional eating strategies   Brandy Saunders has agreed to follow up with our clinic in 3 weeks. Brandy Saunders was informed of the importance of frequent follow up visits to maximize her success with intensive lifestyle modifications for her multiple health conditions.  ALLERGIES: Allergies  Allergen Reactions  . Ibuprofen Diarrhea  . Meloxicam     Pt stated, "It made my emotions out of wack; it made my left hand numb"    MEDICATIONS: Current Outpatient Medications on File Prior to Visit  Medication Sig Dispense Refill  . acetaminophen (TYLENOL) 500 MG tablet Take 500 mg by mouth every 6 (six) hours as needed.    . Ascorbic Acid (VITAMIN C) 1000 MG tablet Take 1,000 mg by mouth daily.    Marland Kitchen BIOTIN 5000 PO Take 10,000 mg by mouth daily.     . Cyanocobalamin (VITAMIN B 12 PO) Take 2,500 mcg by mouth daily.    . medroxyPROGESTERone (PROVERA) 10 MG tablet Take 1 tablet (10 mg total) by mouth daily. Use for ten days 10 tablet 2  . metFORMIN (GLUCOPHAGE) 500 MG tablet Take 1 tablet (500 mg total) by mouth daily with breakfast. 90 tablet 1  . Multiple Vitamins-Calcium (ONE-A-DAY WOMENS PO) Take 1 tablet by mouth daily.     . Oil Base OIL 1 Dose by Does not apply route at bedtime as needed. CBD oil per pt.    . Vitamin D, Ergocalciferol, (DRISDOL) 1.25 MG (50000 UT) CAPS capsule Take 1 capsule (50,000 Units total) by mouth every 7 (seven) days. 4 capsule 0   No current  facility-administered medications on file prior to visit.     PAST MEDICAL HISTORY: Past Medical History:  Diagnosis Date  . Alcohol abuse   . Anxiety   . Asthma   . Back pain   . Binge eating   . Carpal tunnel syndrome, bilateral   . Chest pain   . Common migraine with intractable migraine 02/02/2017  . Constipation   . Depression   . Dyspnea   . Fatty liver   . GERD (gastroesophageal reflux disease)   . Hypertension   . Joint pain   . Leg edema   . Morbidly obese ([place])   . Palpitations   . PCOS (polycystic ovarian syndrome)   . Prediabetes   . Sleep apnea   . Vaginal Pap smear, abnormal   . Vitamin D deficiency     PAST SURGICAL HISTORY: Past Surgical History:  Procedure Laterality Date  . TONSILECTOMY, ADENOIDECTOMY, BILATERAL MYRINGOTOMY  AND TUBES  1994    SOCIAL HISTORY: Social History   Tobacco Use  . Smoking status: Former Smoker    Types: Cigarettes  . Smokeless tobacco: Never Used  Substance Use Topics  . Alcohol use: Yes    Alcohol/week: 3.0 standard drinks    Types: 3 Glasses of wine per week  . Drug use: No    Types: Marijuana    Comment: once every 6 months, taking CBD    FAMILY HISTORY: Family History  Problem Relation Age of Onset  . Hypertension Mother   . Hyperlipidemia Mother   . Obesity Mother   . Heart disease Mother   . Stroke Father   . Obesity Father   . Alcoholism Father   . Drug abuse Father   . Sudden death Father   . Diabetes Maternal Uncle   . Heart disease Maternal Grandmother   . Colon cancer Neg Hx     ROS: Review of Systems  Constitutional: Positive for weight loss.  Cardiovascular: Negative for chest pain.  Psychiatric/Behavioral: Positive for depression. Negative for suicidal ideas.    PHYSICAL EXAM: Blood pressure 106/66, pulse 74, temperature (!) 97.5 F (36.4 C), temperature source Oral, height 5\' 5"  (1.651 m), weight (!) 437 lb (198.2 kg), last menstrual period 06/09/2019, SpO2 99 %. Body mass index is  72.72 kg/m. Physical Exam Vitals signs reviewed.  Constitutional:      Appearance: Normal appearance. Brandy Saunders is obese.  Cardiovascular:     Rate and Rhythm: Normal rate.     Pulses: Normal pulses.  Pulmonary:     Effort: Pulmonary effort is normal.     Breath sounds: Normal breath sounds.  Musculoskeletal: Normal range of motion.  Skin:    General: Skin is warm and dry.  Neurological:     Mental Status: Brandy Saunders is alert and oriented to person, place, and time.  Psychiatric:        Mood and Affect: Mood normal.        Behavior: Behavior normal.     RECENT LABS AND TESTS: BMET    Component Value Date/Time   NA 139 Saunders 0950   K 4.4 Saunders 0950   CL 103 Saunders 0950   CO2 24 Saunders 0950   GLUCOSE 100 (H) Saunders 0950   GLUCOSE 100 (H) 03/10/2019 0840   BUN 10 Saunders 0950   CREATININE 0.56 (L) Saunders 0950   CREATININE 0.67 09/28/2014 1146   CALCIUM 8.9 Saunders 0950   GFRNONAA 126 Saunders 0950   GFRAA 146 Saunders 0950   Lab Results  Component Value Date   HGBA1C 5.8 (H) Saunders   HGBA1C 5.8 (H) 05/02/2018   HGBA1C 5.7 (H) 01/17/2018   HGBA1C 6.0 (H) 10/11/2017   HGBA1C 6.1 09/13/2017   Lab Results  Component Value Date   INSULIN 35.9 (H) Saunders   INSULIN 30.3 (H) 05/02/2018   INSULIN 39.5 (H) 01/17/2018   INSULIN 73.6 (H) 10/11/2017   CBC    Component Value Date/Time   WBC 5.1 Saunders 0950   WBC 4.5 03/10/2019 0840   RBC 4.88 Saunders 0950   RBC 4.92 03/10/2019 0840   HGB 13.6 Saunders 0950   HCT 42.0 Saunders 0950   PLT 272 Saunders 0950   MCV 86 Saunders 0950   MCH 27.9 Saunders 0950   MCH 28.0 12/03/2018 1440   MCHC 32.4 Saunders 0950   MCHC 32.4 03/10/2019 0840   RDW 13.7 Saunders 0950   LYMPHSABS 2.2 Saunders 0950  MONOABS 0.2 03/10/2019 0840   EOSABS 0.1 Saunders 0950   BASOSABS 0.0 Saunders 0950   Iron/TIBC/Ferritin/ %Sat No results found for: IRON, TIBC, FERRITIN, IRONPCTSAT  Lipid Panel     Component Value Date/Time   CHOL 153 Saunders 0950   TRIG 127 Saunders 0950   HDL 52 Saunders 0950   CHOLHDL 3 03/10/2019 0840   VLDL 20.8 03/10/2019 0840   LDLCALC 79 Saunders 0950   Hepatic Function Panel     Component Value Date/Time   PROT 6.7 Saunders 0950   ALBUMIN 4.2 Saunders 0950   AST 26 Saunders 0950   ALT 35 (H) Saunders 0950   ALKPHOS 64 Saunders 0950   BILITOT 0.5 Saunders 0950   BILIDIR 0.1 09/28/2014 1146   IBILI 0.4 09/28/2014 1146      Component Value Date/Time   TSH 2.050 Saunders 0950   TSH 2.56 03/10/2019 0840   TSH 2.18 05/07/2018 1605      OBESITY BEHAVIORAL INTERVENTION VISIT  Today's visit was # 18   Starting weight: 447 lbs Starting date: 10/11/17 Today's weight : 437 lbs Today's date: 07/17/2019 Total lbs lost to date: 10    ASK: We discussed the diagnosis of obesity with Brandy Saunders today and Brandy Saunders agreed to give [place] permission to discuss obesity behavioral modification therapy today.  ASSESS: Brandy Saunders has the diagnosis of obesity and her BMI today is 72.72 Assata is in the action stage of change   ADVISE: Brandy Saunders was educated on the multiple health risks of obesity as well as the benefit of weight loss to improve her health. Brandy Saunders was advised of the need for long term treatment and the importance of lifestyle modifications to improve her current health and to decrease her risk of future health problems.  AGREE: Multiple dietary modification options and treatment options were discussed and  Valorie agreed to follow the recommendations documented in the above note.  ARRANGE: Brierra was educated on the importance of frequent visits to treat obesity as outlined per CMS and USPSTF guidelines and agreed to schedule her next follow up appointment today.  I, Brandy Dredge, am acting as transcriptionist for Dennard Nip, MD  I have reviewed the above documentation for accuracy and  completeness, and I agree with the above. -Dennard Nip, MD

## 2019-07-30 ENCOUNTER — Ambulatory Visit (INDEPENDENT_AMBULATORY_CARE_PROVIDER_SITE_OTHER): Payer: BC Managed Care – PPO | Admitting: Family Medicine

## 2019-07-30 ENCOUNTER — Other Ambulatory Visit: Payer: Self-pay

## 2019-07-30 ENCOUNTER — Encounter: Payer: Self-pay | Admitting: Family Medicine

## 2019-07-30 ENCOUNTER — Ambulatory Visit: Payer: Self-pay | Admitting: *Deleted

## 2019-07-30 VITALS — BP 136/68 | HR 108 | Temp 97.9°F | Ht 65.0 in

## 2019-07-30 DIAGNOSIS — I10 Essential (primary) hypertension: Secondary | ICD-10-CM | POA: Diagnosis not present

## 2019-07-30 DIAGNOSIS — R519 Headache, unspecified: Secondary | ICD-10-CM | POA: Diagnosis not present

## 2019-07-30 MED ORDER — KETOROLAC TROMETHAMINE 60 MG/2ML IM SOLN
60.0000 mg | Freq: Once | INTRAMUSCULAR | Status: AC
  Administered 2019-07-30: 60 mg via INTRAMUSCULAR

## 2019-07-30 NOTE — Telephone Encounter (Signed)
Spoke with patient and she stated that she just wanted guidance on what to do about her bp medication.  She stated that she has not taken it for 3 days because she just did not put in with her other pills to take.  I spoke with Dr. Nani Ravens and advised him of situation because patient does not want to go to ED as directed by the nurse and I.  He will see her today and appointment made.

## 2019-07-30 NOTE — Patient Instructions (Addendum)
Take half a tab of your Metoprolol tonight and then go back to your regular 100 mg daily routine tomorrow.  Keep the diet clean and stay active.   OK to take Tylenol 1000 mg (2 extra strength tabs) or 975 mg (3 regular strength tabs) every 6 hours as needed.  No NSAIDs (ibuprofen, Advil, etc) for the rest of today.   Follow up as originally scheduled with Dr. Etter Sjogren.   Let us know if you need anything.

## 2019-07-30 NOTE — Progress Notes (Signed)
Chief Complaint  Patient presents with  . Hypertension  . Headache    Subjective Brandy Saunders is a 28 y.o. female who presents for hypertension follow up. She does not monitor home blood pressures. She is usually compliant with medications Toprol-XL 100 mg daily. Patient has these side effects of medication: none She is currently adhering to a healthy diet overall.  Patient has a right-sided headache.  This is the typical side her headaches fall on.  Toradol typically works well for this.  She is not pregnant and has no plans of getting pregnant in the near future.   Past Medical History:  Diagnosis Date  . Alcohol abuse   . Anxiety   . Asthma   . Back pain   . Binge eating   . Carpal tunnel syndrome, bilateral   . Chest pain   . Common migraine with intractable migraine 02/02/2017  . Constipation   . Depression   . Dyspnea   . Fatty liver   . GERD (gastroesophageal reflux disease)   . Hypertension   . Joint pain   . Leg edema   . Morbidly obese ([place])   . Palpitations   . PCOS (polycystic ovarian syndrome)   . Prediabetes   . Sleep apnea   . Vaginal Pap smear, abnormal   . Vitamin D deficiency     Review of Systems Cardiovascular: no chest pain Respiratory:  no shortness of breath  Exam BP 136/68 (BP Location: Right Arm, Patient Position: Sitting, Cuff Size: Large)   Pulse (!) 108   Temp 97.9 F (36.6 C) (Temporal)   Ht 5\' 5"  (1.651 m)   SpO2 94%   BMI 72.72 kg/m  General:  well developed, well nourished, in no apparent distress Heart: RRR, no bruits, no LE edema Lungs: clear to auscultation, no accessory muscle use MSK: Tender to palpation over the midline and paraspinal cervical musculature; there is no tenderness over the temporalis muscles or TMJ Neuro: DTRs equal and symmetric, no cerebellar signs Psych: well oriented with normal range of affect and appropriate judgment/insight  Essential hypertension - Plan: EKG 12-Lead  Acute nonintractable  headache, unspecified headache type - Plan: ketorolac (TORADOL) injection 60 mg  Blood pressure is still higher than what it usually is.  Take half a tab of her Toprol tonight and resume her normal 100 mg daily starting tomorrow. Counseled on diet and exercise.  EKG is reassuring. Toradol injection for her headache today.  Question whether it is related to her blood pressure.  No anti-inflammatories for the rest of the day. F/u as originally scheduled with Dr. Etter Sjogren. The patient voiced understanding and agreement to the plan.  [place], DO 07/30/19  4:17 PM

## 2019-07-30 NOTE — Telephone Encounter (Signed)
Per initial encounter, Pt stated she has missed her metoprolol succinate (TOPROL-XL) 100 MG 24 hr tablet for 3 days. (Not on current med list) She stated she has had some headaches since missing doses and would like to speak with a nurse about safely starting back medication; contacted pt; she states that she has not taken her BP today; her headache her typical migraine since 07/29/2019; she denies SOB, but she had intermittent chest pain, and her eyes were dry & blurry on 07/29/2019; she is currently not having symptoms (which the pt says can be a side effect of Wellbutrin); the pt says that she is trying to obtain emergency visit at [facility] weight management center; she did take a dose of her medication at 1200; the pt does not have a way to check her BP at home; recommendations made per nurse; the pt verbalized understanding;she sees Dr Etter Sjogren, [place]; pt transferred to [facility] for final disposition.  Reason for Disposition . [id number] Systolic BP  >= [id number] OR Diastolic >= [id number] AND [id number] missed most recent dose of blood pressure medication  Answer Assessment - Initial Assessment Questions 1.   NAME of MEDICATION: "What medicine are you calling about?"     Pt missed 3 doses of metoprolol  2.   QUESTION: "What is your question?"    How do I start taking medication again 3.   PRESCRIBING HCP: "Who prescribed it?" Reason: if prescribed by specialist, call should be referred to that group.     Dr Etter Sjogren 4. SYMPTOMS: "Do you have any symptoms?"     07/29/2019 intermittent chest pain, headache (chronic/migraines) 5. SEVERITY: If symptoms are present, ask "Are they mild, moderate or severe?"     mild 6.  PREGNANCY:  "Is there any chance that you are pregnant?" "When was your last menstrual period?" No LMP 06/17/2019  Answer Assessment - Initial Assessment Questions 1. BLOOD PRESSURE: "What is the blood pressure?" "Did you take at least two measurements 5 minutes apart?"    Pt has no way to obtain readings 2.  ONSET: "When did you take your blood pressure?"   n/a 3. HOW: "How did you obtain the blood pressure?" (e.g., visiting nurse, automatic home BP monitor)    n/a 4. HISTORY: "Do you have a history of high blood pressure?"   yes 5. MEDICATIONS: "Are you taking any medications for blood pressure?" "Have you missed any doses recently?"  yes; missed 3 doses 6. OTHER SYMPTOMS: "Do you have any symptoms?" (e.g., headache, chest pain, blurred vision, difficulty breathing, weakness)     Chest pain and blurry vision on 07/29/2019; headache 07/29/2019 7. PREGNANCY: "Is there any chance you are pregnant?" "When was your last menstrual period?"     No LMP 06/17/2019  Protocols used: HIGH BLOOD PRESSURE-A-AH, MEDICATION QUESTION CALL-A-AH

## 2019-07-31 ENCOUNTER — Telehealth (INDEPENDENT_AMBULATORY_CARE_PROVIDER_SITE_OTHER): Payer: Self-pay

## 2019-07-31 DIAGNOSIS — F419 Anxiety disorder, unspecified: Secondary | ICD-10-CM | POA: Diagnosis not present

## 2019-07-31 NOTE — Telephone Encounter (Signed)
Patient called stating she has experienced headache, lightheaded, dizzy, chest pain and dry mouth on yesterday. At the time of the call she was experiencing a headache. Patient states she started Wellbutrin on 11/22 and missed doses of her Metoprolol that was started by her PCP. Patient called her PCP while speaking with her and they triaged her over the phone and was in the process of trying to get her seen that day. I informed the patient to go to the ED if her symptoms worsen and if the chest pain returns. Pt verbalized understanding.   Called pt on 12/3 to follow up, left a message to return my call.   Treshun Wold, [place]

## 2019-07-31 NOTE — Telephone Encounter (Signed)
Patient called back stating she was seen by her PCP on 07/30/19 and they gave her an injection for her headache and she is now back on her medication regimen as instructed. She states she feels better today with no symptoms. Will keep her scheduled appt with [place] next week. Will make Dr Leafy Ro aware.   Kynzleigh Bandel, [place]

## 2019-08-06 ENCOUNTER — Other Ambulatory Visit (INDEPENDENT_AMBULATORY_CARE_PROVIDER_SITE_OTHER): Payer: Self-pay | Admitting: Family Medicine

## 2019-08-06 DIAGNOSIS — E559 Vitamin D deficiency, unspecified: Secondary | ICD-10-CM

## 2019-08-07 ENCOUNTER — Encounter (INDEPENDENT_AMBULATORY_CARE_PROVIDER_SITE_OTHER): Payer: Self-pay | Admitting: Family Medicine

## 2019-08-07 ENCOUNTER — Other Ambulatory Visit: Payer: Self-pay

## 2019-08-07 ENCOUNTER — Ambulatory Visit (INDEPENDENT_AMBULATORY_CARE_PROVIDER_SITE_OTHER): Payer: BC Managed Care – PPO | Admitting: Family Medicine

## 2019-08-07 VITALS — BP 111/67 | HR 71 | Temp 98.1°F | Ht 65.0 in | Wt >= 6400 oz

## 2019-08-07 DIAGNOSIS — E559 Vitamin D deficiency, unspecified: Secondary | ICD-10-CM | POA: Diagnosis not present

## 2019-08-07 DIAGNOSIS — Z9189 Other specified personal risk factors, not elsewhere classified: Secondary | ICD-10-CM

## 2019-08-07 DIAGNOSIS — F3289 Other specified depressive episodes: Secondary | ICD-10-CM

## 2019-08-07 DIAGNOSIS — Z6841 Body Mass Index (BMI) 40.0 and over, adult: Secondary | ICD-10-CM

## 2019-08-07 MED ORDER — VITAMIN D (ERGOCALCIFEROL) 1.25 MG (50000 UNIT) PO CAPS: 50000.0000 [IU] | ORAL_CAPSULE | ORAL | 0 refills | Status: DC

## 2019-08-07 MED ORDER — BUPROPION HCL ER (SR) 150 MG PO TB12: 150.0000 mg | ORAL_TABLET | ORAL | 0 refills | Status: DC

## 2019-08-11 NOTE — Progress Notes (Signed)
Office: [phone]  /  Fax: [phone]   HPI:  Chief Complaint: OBESITY Shyanna is here to discuss her progress with her obesity treatment plan. She is on the keep a food journal with 1600-2000 calories and 100+ grams of protein daily and states she is following her eating plan approximately 60 % of the time. She states she is walking the dogs for 15 minutes 3-4 times per week.  Chele continues to do well with weight loss. She is journaling on and off, but she is struggling to eat all of her protein especially since her appetite has decreased.  Today's visit was # 19  Starting weight: 447 lbs Starting date: 10/11/17 Today's weight : 433 lbs  Today's date: 08/07/2019 Total lbs lost to date: 14 Total lbs lost since last in-office visit: 4  Vitamin D Deficiency [place] has a diagnosis of vitamin D deficiency. She is stable on prescription Vit D. She denies nausea, vomiting or muscle weakness. She requests a refill today.  Depression with Emotional Eating Behaviors Zareena started Wellbutrin and feels it is helping with her emotional eating. She also notes decreased appetite, which sometimes makes eating her protein difficult.  At risk for cardiovascular disease Kalii is at a higher than average risk for cardiovascular disease due to obesity. She currently denies any chest pain.  ASSESSMENT AND PLAN:  Vitamin D deficiency - Plan: Vitamin D, Ergocalciferol, (DRISDOL) 1.25 MG (50000 UT) CAPS capsule  Other depression,with emotional eating - Plan: buPROPion (WELLBUTRIN SR) 150 MG 12 hr tablet  At risk for heart disease  Class 3 severe obesity with serious comorbidity and body mass index (BMI) greater than or equal to 70 in adult, unspecified obesity type (HCC)  PLAN:  Vitamin D Deficiency Low vitamin D level contributes to fatigue and are associated with obesity, breast, and colon cancer. Leane agrees to continue taking prescription Vit D 50,000 IU every week #4 and  we will refill for 1 month. She will follow up for routine testing of vitamin D, at least 2-3 times per year to avoid over-replacement. Emelina agrees to follow up with our clinic in 2 weeks.  Emotional Eating Behaviors (other depression) Behavior modification techniques were discussed today to help Amai deal with her emotional/non-hunger eating behaviors. Jaicey agrees to continue taking Wellbutrin SR 150 mg PO q AM #30 and we will refill for 1 month. Teesha agrees to follow up with our clinic in 2 weeks.  Cardiovascular risk counseling Timyra was given (~15 minutes) coronary artery disease prevention counseling today. She is 28 y.o. female and has risk factors for heart disease including obesity. We discussed intensive lifestyle modifications today with an emphasis on specific weight loss instructions and strategies.   Obesity Amarise is currently in the action stage of change. As such, her goal is to continue with weight loss efforts She has agreed to keep a food journal with 1600-2000 calories and 100+ grams of protein daily Emari has been instructed to work up to a goal of 150 minutes of combined cardio and strengthening exercise per week for weight loss and overall health benefits. We discussed the following Behavioral Modification Strategies today: increasing lean protein intake and work on meal planning and easy cooking plans   Alianna has agreed to follow up with our clinic in 2 weeks. She was informed of the importance of frequent follow up visits to maximize her success with intensive lifestyle modifications for her multiple health conditions.  ALLERGIES: Allergies  Allergen Reactions  . Ibuprofen Diarrhea  .  Meloxicam     Pt stated, "It made my emotions out of wack; it made my left hand numb"    MEDICATIONS: Current Outpatient Medications on File Prior to Visit  Medication Sig Dispense Refill  . acetaminophen (TYLENOL) 500 MG tablet Take 500 mg by mouth every  6 (six) hours as needed.    . Ascorbic Acid (VITAMIN C) 1000 MG tablet Take 1,000 mg by mouth daily.    Marland Kitchen BIOTIN 5000 PO Take 10,000 mg by mouth daily.     . Cyanocobalamin (VITAMIN B 12 PO) Take 2,500 mcg by mouth daily.    . medroxyPROGESTERone (PROVERA) 10 MG tablet Take 1 tablet (10 mg total) by mouth daily. Use for ten days 10 tablet 2  . metFORMIN (GLUCOPHAGE) 500 MG tablet Take 1 tablet (500 mg total) by mouth daily with breakfast. 90 tablet 1  . metoprolol succinate (TOPROL-XL) 100 MG 24 hr tablet Take 100 mg by mouth daily. Take with or immediately following a meal.    . Multiple Vitamins-Calcium (ONE-A-DAY WOMENS PO) Take 1 tablet by mouth daily.     . Oil Base OIL 1 Dose by Does not apply route at bedtime as needed. CBD oil per pt.     No current facility-administered medications on file prior to visit.    PAST MEDICAL HISTORY: Past Medical History:  Diagnosis Date  . Alcohol abuse   . Anxiety   . Asthma   . Back pain   . Binge eating   . Carpal tunnel syndrome, bilateral   . Chest pain   . Common migraine with intractable migraine 02/02/2017  . Constipation   . Depression   . Dyspnea   . Fatty liver   . GERD (gastroesophageal reflux disease)   . Hypertension   . Joint pain   . Leg edema   . Morbidly obese ([place])   . Palpitations   . PCOS (polycystic ovarian syndrome)   . Prediabetes   . Sleep apnea   . Vaginal Pap smear, abnormal   . Vitamin D deficiency     PAST SURGICAL HISTORY: Past Surgical History:  Procedure Laterality Date  . TONSILECTOMY, ADENOIDECTOMY, BILATERAL MYRINGOTOMY AND TUBES  1994    SOCIAL HISTORY: Social History   Tobacco Use  . Smoking status: Former Smoker    Types: Cigarettes  . Smokeless tobacco: Never Used  Substance Use Topics  . Alcohol use: Yes    Alcohol/week: 3.0 standard drinks    Types: 3 Glasses of wine per week  . Drug use: No    Types: Marijuana    Comment: once every 6 months, taking CBD    FAMILY  HISTORY: Family History  Problem Relation Age of Onset  . Hypertension Mother   . Hyperlipidemia Mother   . Obesity Mother   . Heart disease Mother   . Stroke Father   . Obesity Father   . Alcoholism Father   . Drug abuse Father   . Sudden death Father   . Diabetes Maternal Uncle   . Heart disease Maternal Grandmother   . Colon cancer Neg Hx     ROS: Review of Systems  Constitutional: Positive for weight loss.  Cardiovascular: Negative for chest pain.  Gastrointestinal: Negative for nausea and vomiting.  Musculoskeletal:       Negative muscle weakness  Psychiatric/Behavioral: Positive for depression.    PHYSICAL EXAM: Blood pressure 111/67, pulse 71, temperature 98.1 F (36.7 C), temperature source Oral, height 5\' 5"  (1.651 m), weight Marland Kitchen)  433 lb (196.4 kg), last menstrual period 06/17/2019, SpO2 99 %. Body mass index is 72.05 kg/m. Physical Exam Vitals reviewed.  Constitutional:      Appearance: Normal appearance. She is obese.  Cardiovascular:     Rate and Rhythm: Normal rate.     Pulses: Normal pulses.  Pulmonary:     Effort: Pulmonary effort is normal.     Breath sounds: Normal breath sounds.  Musculoskeletal:        General: Normal range of motion.  Skin:    General: Skin is warm and dry.  Neurological:     Mental Status: She is alert and oriented to person, place, and time.  Psychiatric:        Mood and Affect: Mood normal.        Behavior: Behavior normal.     RECENT LABS AND TESTS: BMET    Component Value Date/Time   NA 139 06/17/2019 0950   K 4.4 06/17/2019 0950   CL 103 06/17/2019 0950   CO2 24 06/17/2019 0950   GLUCOSE 100 (H) 06/17/2019 0950   GLUCOSE 100 (H) 03/10/2019 0840   BUN 10 06/17/2019 0950   CREATININE 0.56 (L) 06/17/2019 0950   CREATININE 0.67 09/28/2014 1146   CALCIUM 8.9 06/17/2019 0950   GFRNONAA 126 06/17/2019 0950   GFRAA 146 06/17/2019 0950   Lab Results  Component Value Date   HGBA1C 5.8 (H) 06/17/2019   HGBA1C 5.8  (H) 05/02/2018   HGBA1C 5.7 (H) 01/17/2018   HGBA1C 6.0 (H) 10/11/2017   HGBA1C 6.1 09/13/2017   Lab Results  Component Value Date   INSULIN 35.9 (H) 06/17/2019   INSULIN 30.3 (H) 05/02/2018   INSULIN 39.5 (H) 01/17/2018   INSULIN 73.6 (H) 10/11/2017   CBC    Component Value Date/Time   WBC 5.1 06/17/2019 0950   WBC 4.5 03/10/2019 0840   RBC 4.88 06/17/2019 0950   RBC 4.92 03/10/2019 0840   HGB 13.6 06/17/2019 0950   HCT 42.0 06/17/2019 0950   PLT 272 06/17/2019 0950   MCV 86 06/17/2019 0950   MCH 27.9 06/17/2019 0950   MCH 28.0 12/03/2018 1440   MCHC 32.4 06/17/2019 0950   MCHC 32.4 03/10/2019 0840   RDW 13.7 06/17/2019 0950   LYMPHSABS 2.2 06/17/2019 0950   MONOABS 0.2 03/10/2019 0840   EOSABS 0.1 06/17/2019 0950   BASOSABS 0.0 06/17/2019 0950   Iron/TIBC/Ferritin/ %Sat No results found for: IRON, TIBC, FERRITIN, IRONPCTSAT Lipid Panel     Component Value Date/Time   CHOL 153 06/17/2019 0950   TRIG 127 06/17/2019 0950   HDL 52 06/17/2019 0950   CHOLHDL 3 03/10/2019 0840   VLDL 20.8 03/10/2019 0840   LDLCALC 79 06/17/2019 0950   Hepatic Function Panel     Component Value Date/Time   PROT 6.7 06/17/2019 0950   ALBUMIN 4.2 06/17/2019 0950   AST 26 06/17/2019 0950   ALT 35 (H) 06/17/2019 0950   ALKPHOS 64 06/17/2019 0950   BILITOT 0.5 06/17/2019 0950   BILIDIR 0.1 09/28/2014 1146   IBILI 0.4 09/28/2014 1146      Component Value Date/Time   TSH 2.050 06/17/2019 0950   TSH 2.56 03/10/2019 0840   TSH 2.18 05/07/2018 1605        I, Trixie Dredge, am acting as Location manager for Dennard Nip, MD I have reviewed the above documentation for accuracy and completeness, and I agree with the above. -Dennard Nip, MD

## 2019-08-14 DIAGNOSIS — F419 Anxiety disorder, unspecified: Secondary | ICD-10-CM | POA: Diagnosis not present

## 2019-08-20 ENCOUNTER — Other Ambulatory Visit: Payer: Self-pay

## 2019-08-20 ENCOUNTER — Telehealth (INDEPENDENT_AMBULATORY_CARE_PROVIDER_SITE_OTHER): Payer: BC Managed Care – PPO | Admitting: Family Medicine

## 2019-08-20 ENCOUNTER — Encounter (INDEPENDENT_AMBULATORY_CARE_PROVIDER_SITE_OTHER): Payer: Self-pay | Admitting: Family Medicine

## 2019-08-20 DIAGNOSIS — Z6841 Body Mass Index (BMI) 40.0 and over, adult: Secondary | ICD-10-CM | POA: Diagnosis not present

## 2019-08-20 DIAGNOSIS — F3289 Other specified depressive episodes: Secondary | ICD-10-CM

## 2019-08-28 DIAGNOSIS — F419 Anxiety disorder, unspecified: Secondary | ICD-10-CM | POA: Diagnosis not present

## 2019-09-01 NOTE — Progress Notes (Signed)
Office: [phone]  /  Fax: [phone] TeleHealth Visit:  Brandy Saunders has verbally consented to this TeleHealth visit today. The patient is located at home, the provider is located at the [employer] [facility] office. The participants in this visit include the listed provider and patient and any and all parties involved. The visit was conducted today via FaceTime.  HPI:  Chief Complaint: OBESITY Brandy Saunders is here to discuss her progress with her obesity treatment plan. She is on the keep a food journal of 1600 to 2000 calories and 100+ grams of protein daily plan and states she is following her eating plan approximately 20 % of the time. She states she is walking the dog 15 minutes 3 times per week.  Brandy Saunders has done some celebration eating and increased snacking since our last visit. She suspects she may have gained some weight (weight not reported). Hunger for "regular meals" has decreased, but not for snacks as much. She is not journaling as often and she still struggles to meet her protein goals.  Emotional Eating Brandy Saunders continues to do better with emotional eating on Wellbutrin, but she is still under a lot of stress with work and moving. She is doing some therapy as well.  ASSESSMENT AND PLAN:  Other depression,with emotional eating  Class 3 severe obesity with serious comorbidity and body mass index (BMI) greater than or equal to 70 in adult, unspecified obesity type [facility])  PLAN:  Emotional Eating Behavior modification techniques were discussed today to help Brandy Saunders deal with her emotional/non-hunger eating behaviors. Brandy Saunders will continue Wellbutrin as is prescribed and she will follow up in 2 weeks, after the holidays, and we may increase her dose at that time. Orders and follow up as documented in patient record.   Time 3 We spent > 50% of today's 15 minute visit on the counseling and coordination of care documented above.  Obesity Brandy Saunders is currently  in the action stage of change. As such, her goal is to continue with weight loss efforts. She has agreed to portion control better and make smarter food choices, such as increasing vegetables and decreasing simple carbohydrates . Brandy Saunders has been instructed to work up to a goal of 150 minutes of combined cardio and strengthening exercise per week for weight loss and overall health benefits. We discussed the following Behavioral Modification Strategies today: increasing lean protein intake and holiday eating strategies .  Brandy Saunders has agreed to follow-up with our clinic in 2 weeks. She was informed of the importance of frequent follow-up visits to maximize her success with intensive lifestyle modifications for her multiple health conditions.  ALLERGIES: Allergies  Allergen Reactions  . Ibuprofen Diarrhea  . Meloxicam     Pt stated, "It made my emotions out of wack; it made my left hand numb"    MEDICATIONS: Current Outpatient Medications on File Prior to Visit  Medication Sig Dispense Refill  . acetaminophen (TYLENOL) 500 MG tablet Take 500 mg by mouth every 6 (six) hours as needed.    . Ascorbic Acid (VITAMIN C) 1000 MG tablet Take 1,000 mg by mouth daily.    Marland Kitchen BIOTIN 5000 PO Take 10,000 mg by mouth daily.     Marland Kitchen buPROPion (WELLBUTRIN SR) 150 MG 12 hr tablet Take 1 tablet (150 mg total) by mouth every morning. 30 tablet 0  . Cyanocobalamin (VITAMIN B 12 PO) Take 2,500 mcg by mouth daily.    . medroxyPROGESTERone (PROVERA) 10 MG tablet Take 1 tablet (10 mg total)  by mouth daily. Use for ten days 10 tablet 2  . metFORMIN (GLUCOPHAGE) 500 MG tablet Take 1 tablet (500 mg total) by mouth daily with breakfast. 90 tablet 1  . metoprolol succinate (TOPROL-XL) 100 MG 24 hr tablet Take 100 mg by mouth daily. Take with or immediately following a meal.    . Multiple Vitamins-Calcium (ONE-A-DAY WOMENS PO) Take 1 tablet by mouth daily.     . Oil Base OIL 1 Dose by Does not apply route at bedtime as  needed. CBD oil per pt.    . Vitamin D, Ergocalciferol, (DRISDOL) 1.25 MG (50000 UT) CAPS capsule Take 1 capsule (50,000 Units total) by mouth every 7 (seven) days. 4 capsule 0   No current facility-administered medications on file prior to visit.    PAST MEDICAL HISTORY: Past Medical History:  Diagnosis Date  . Alcohol abuse   . Anxiety   . Asthma   . Back pain   . Binge eating   . Carpal tunnel syndrome, bilateral   . Chest pain   . Common migraine with intractable migraine 02/02/2017  . Constipation   . Depression   . Dyspnea   . Fatty liver   . GERD (gastroesophageal reflux disease)   . Hypertension   . Joint pain   . Leg edema   . Morbidly obese ([place])   . Palpitations   . PCOS (polycystic ovarian syndrome)   . Prediabetes   . Sleep apnea   . Vaginal Pap smear, abnormal   . Vitamin D deficiency     PAST SURGICAL HISTORY: Past Surgical History:  Procedure Laterality Date  . TONSILECTOMY, ADENOIDECTOMY, BILATERAL MYRINGOTOMY AND TUBES  1994    SOCIAL HISTORY: Social History   Tobacco Use  . Smoking status: Former Smoker    Types: Cigarettes  . Smokeless tobacco: Never Used  Substance Use Topics  . Alcohol use: Yes    Alcohol/week: 3.0 standard drinks    Types: 3 Glasses of wine per week  . Drug use: No    Types: Marijuana    Comment: once every 6 months, taking CBD    FAMILY HISTORY: Family History  Problem Relation Age of Onset  . Hypertension Mother   . Hyperlipidemia Mother   . Obesity Mother   . Heart disease Mother   . Stroke Father   . Obesity Father   . Alcoholism Father   . Drug abuse Father   . Sudden death Father   . Diabetes Maternal Uncle   . Heart disease Maternal Grandmother   . Colon cancer Neg Hx     ROS: Review of Systems  Constitutional: Negative for weight loss.  Psychiatric/Behavioral: Positive for depression.       Positive for stress    PHYSICAL EXAM: There were no vitals taken for this visit. There is no height  or weight on file to calculate BMI. Physical Exam Vitals reviewed.  Constitutional:      General: She is not in acute distress.    Appearance: Normal appearance. She is well-developed. She is obese.  Cardiovascular:     Rate and Rhythm: Normal rate.  Pulmonary:     Effort: Pulmonary effort is normal.  Musculoskeletal:        General: Normal range of motion.  Skin:    General: Skin is warm and dry.  Neurological:     Mental Status: She is alert and oriented to person, place, and time.  Psychiatric:        Mood  and Affect: Mood normal.        Behavior: Behavior normal.     RECENT LABS AND TESTS: BMET    Component Value Date/Time   NA 139 06/17/2019 0950   K 4.4 06/17/2019 0950   CL 103 06/17/2019 0950   CO2 24 06/17/2019 0950   GLUCOSE 100 (H) 06/17/2019 0950   GLUCOSE 100 (H) 03/10/2019 0840   BUN 10 06/17/2019 0950   CREATININE 0.56 (L) 06/17/2019 0950   CREATININE 0.67 09/28/2014 1146   CALCIUM 8.9 06/17/2019 0950   GFRNONAA 126 06/17/2019 0950   GFRAA 146 06/17/2019 0950   Lab Results  Component Value Date   HGBA1C 5.8 (H) 06/17/2019   HGBA1C 5.8 (H) 05/02/2018   HGBA1C 5.7 (H) 01/17/2018   HGBA1C 6.0 (H) 10/11/2017   HGBA1C 6.1 09/13/2017   Lab Results  Component Value Date   INSULIN 35.9 (H) 06/17/2019   INSULIN 30.3 (H) 05/02/2018   INSULIN 39.5 (H) 01/17/2018   INSULIN 73.6 (H) 10/11/2017   CBC    Component Value Date/Time   WBC 5.1 06/17/2019 0950   WBC 4.5 03/10/2019 0840   RBC 4.88 06/17/2019 0950   RBC 4.92 03/10/2019 0840   HGB 13.6 06/17/2019 0950   HCT 42.0 06/17/2019 0950   PLT 272 06/17/2019 0950   MCV 86 06/17/2019 0950   MCH 27.9 06/17/2019 0950   MCH 28.0 12/03/2018 1440   MCHC 32.4 06/17/2019 0950   MCHC 32.4 03/10/2019 0840   RDW 13.7 06/17/2019 0950   LYMPHSABS 2.2 06/17/2019 0950   MONOABS 0.2 03/10/2019 0840   EOSABS 0.1 06/17/2019 0950   BASOSABS 0.0 06/17/2019 0950   Iron/TIBC/Ferritin/ %Sat No results found for:  IRON, TIBC, FERRITIN, IRONPCTSAT Lipid Panel     Component Value Date/Time   CHOL 153 06/17/2019 0950   TRIG 127 06/17/2019 0950   HDL 52 06/17/2019 0950   CHOLHDL 3 03/10/2019 0840   VLDL 20.8 03/10/2019 0840   LDLCALC 79 06/17/2019 0950   Hepatic Function Panel     Component Value Date/Time   PROT 6.7 06/17/2019 0950   ALBUMIN 4.2 06/17/2019 0950   AST 26 06/17/2019 0950   ALT 35 (H) 06/17/2019 0950   ALKPHOS 64 06/17/2019 0950   BILITOT 0.5 06/17/2019 0950   BILIDIR 0.1 09/28/2014 1146   IBILI 0.4 09/28/2014 1146      Component Value Date/Time   TSH 2.050 06/17/2019 0950   TSH 2.56 03/10/2019 0840   TSH 2.18 05/07/2018 1605    Ref. Range 06/17/2019 09:50  Vitamin D, 25-Hydroxy Latest Ref Range: 30.0 - 100.0 ng/mL 28.1 (L)    I, Doreene Nest, am acting as transcriptionist for Dennard Nip, MD I have reviewed the above documentation for accuracy and completeness, and I agree with the above. -Dennard Nip, MD

## 2019-09-03 ENCOUNTER — Ambulatory Visit (INDEPENDENT_AMBULATORY_CARE_PROVIDER_SITE_OTHER): Payer: BC Managed Care – PPO | Admitting: Family Medicine

## 2019-09-03 ENCOUNTER — Other Ambulatory Visit: Payer: Self-pay

## 2019-09-03 ENCOUNTER — Encounter (HOSPITAL_BASED_OUTPATIENT_CLINIC_OR_DEPARTMENT_OTHER): Payer: Self-pay

## 2019-09-03 ENCOUNTER — Other Ambulatory Visit (INDEPENDENT_AMBULATORY_CARE_PROVIDER_SITE_OTHER): Payer: Self-pay | Admitting: Family Medicine

## 2019-09-03 ENCOUNTER — Emergency Department (HOSPITAL_BASED_OUTPATIENT_CLINIC_OR_DEPARTMENT_OTHER): Payer: BC Managed Care – PPO

## 2019-09-03 ENCOUNTER — Emergency Department (HOSPITAL_BASED_OUTPATIENT_CLINIC_OR_DEPARTMENT_OTHER)
Admission: EM | Admit: 2019-09-03 | Discharge: 2019-09-04 | Disposition: A | Discharge status: Home / Self Care | Payer: BC Managed Care – PPO | Attending: Emergency Medicine | Admitting: Emergency Medicine

## 2019-09-03 ENCOUNTER — Encounter (INDEPENDENT_AMBULATORY_CARE_PROVIDER_SITE_OTHER): Payer: Self-pay | Admitting: Family Medicine

## 2019-09-03 VITALS — BP 140/78 | HR 81 | Temp 97.9°F | Ht 65.0 in | Wt >= 6400 oz

## 2019-09-03 DIAGNOSIS — I10 Essential (primary) hypertension: Secondary | ICD-10-CM

## 2019-09-03 DIAGNOSIS — Z87891 Personal history of nicotine dependence: Secondary | ICD-10-CM | POA: Diagnosis not present

## 2019-09-03 DIAGNOSIS — S29011A Strain of muscle and tendon of front wall of thorax, initial encounter: Secondary | ICD-10-CM | POA: Diagnosis not present

## 2019-09-03 DIAGNOSIS — E559 Vitamin D deficiency, unspecified: Secondary | ICD-10-CM

## 2019-09-03 DIAGNOSIS — S29012A Strain of muscle and tendon of back wall of thorax, initial encounter: Secondary | ICD-10-CM | POA: Diagnosis not present

## 2019-09-03 DIAGNOSIS — R079 Chest pain, unspecified: Secondary | ICD-10-CM | POA: Diagnosis not present

## 2019-09-03 DIAGNOSIS — M545 Low back pain, unspecified: Secondary | ICD-10-CM

## 2019-09-03 DIAGNOSIS — X58XXXA Exposure to other specified factors, initial encounter: Secondary | ICD-10-CM | POA: Insufficient documentation

## 2019-09-03 DIAGNOSIS — F3289 Other specified depressive episodes: Secondary | ICD-10-CM | POA: Diagnosis not present

## 2019-09-03 DIAGNOSIS — S29001A Unspecified injury of muscle and tendon of front wall of thorax, initial encounter: Secondary | ICD-10-CM | POA: Diagnosis not present

## 2019-09-03 DIAGNOSIS — Y929 Unspecified place or not applicable: Secondary | ICD-10-CM | POA: Insufficient documentation

## 2019-09-03 DIAGNOSIS — J45909 Unspecified asthma, uncomplicated: Secondary | ICD-10-CM | POA: Insufficient documentation

## 2019-09-03 DIAGNOSIS — Z7984 Long term (current) use of oral hypoglycemic drugs: Secondary | ICD-10-CM | POA: Diagnosis not present

## 2019-09-03 DIAGNOSIS — Y939 Activity, unspecified: Secondary | ICD-10-CM | POA: Insufficient documentation

## 2019-09-03 DIAGNOSIS — Z9189 Other specified personal risk factors, not elsewhere classified: Secondary | ICD-10-CM

## 2019-09-03 DIAGNOSIS — Y999 Unspecified external cause status: Secondary | ICD-10-CM | POA: Insufficient documentation

## 2019-09-03 DIAGNOSIS — Z79899 Other long term (current) drug therapy: Secondary | ICD-10-CM | POA: Diagnosis not present

## 2019-09-03 DIAGNOSIS — Z6841 Body Mass Index (BMI) 40.0 and over, adult: Secondary | ICD-10-CM

## 2019-09-03 LAB — BASIC METABOLIC PANEL
Anion gap: 11 (ref 5–15)
BUN: 14 mg/dL (ref 6–20)
CO2: 24 mmol/L (ref 22–32)
Calcium: 9.1 mg/dL (ref 8.9–10.3)
Chloride: 103 mmol/L (ref 98–111)
Creatinine, Ser: 0.81 mg/dL (ref 0.44–1.00)
GFR calc Af Amer: >60 mL/min (ref >60)
GFR calc non Af Amer: >60 mL/min (ref >60)
Glucose, Bld: 142 mg/dL — ABNORMAL HIGH (ref 70–99)
Potassium: 3.8 mmol/L (ref 3.5–5.1)
Sodium: 138 mmol/L (ref 135–145)

## 2019-09-03 LAB — TROPONIN I (HIGH SENSITIVITY): Troponin I (High Sensitivity): <2 ng/L (ref <18)

## 2019-09-03 LAB — CBC
HCT: 41.2 % (ref 36.0–46.0)
Hemoglobin: 13.4 g/dL (ref 12.0–15.0)
MCH: 27.9 pg (ref 26.0–34.0)
MCHC: 32.5 g/dL (ref 30.0–36.0)
MCV: 85.7 fL (ref 80.0–100.0)
Platelets: 248 10*3/uL (ref 150–400)
RBC: 4.81 MIL/uL (ref 3.87–5.11)
RDW: 12.8 % (ref 11.5–15.5)
WBC: 6.3 10*3/uL (ref 4.0–10.5)
nRBC: 0 % (ref 0.0–0.2)

## 2019-09-03 MED ORDER — SODIUM CHLORIDE 0.9% FLUSH
3.0000 mL | Freq: Once | INTRAVENOUS | Status: DC
  Filled 2019-09-03: qty 3

## 2019-09-03 NOTE — ED Triage Notes (Signed)
Pt c/o intermittent CP x 5 days-NAD-steady gait

## 2019-09-03 NOTE — ED Provider Notes (Signed)
[facility] DEPARTMENT Provider Note  CSN: [id number] Arrival date & time: 09/03/19 2104  Chief Complaint(s) Chest Pain  HPI Brandy Saunders is a 29 y.o. female    Chest Pain Pain location:  L lateral chest and L chest Pain quality: dull   Pain severity:  Moderate Onset quality:  Gradual Duration:  3 days Timing:  Constant Progression:  Waxing and waning Chronicity:  Recurrent Context: movement   Relieved by:  Nothing Worsened by:  Movement Associated symptoms: back pain and shortness of breath (due to pain)   Associated symptoms: no cough, no fatigue, no fever, no nausea and no vomiting     Past Medical History Past Medical History:  Diagnosis Date  . Alcohol abuse   . Anxiety   . Asthma   . Back pain   . Binge eating   . Carpal tunnel syndrome, bilateral   . Chest pain   . Common migraine with intractable migraine 02/02/2017  . Constipation   . Depression   . Dyspnea   . Fatty liver   . GERD (gastroesophageal reflux disease)   . Hypertension   . Joint pain   . Leg edema   . Morbidly obese ([place])   . Palpitations   . PCOS (polycystic ovarian syndrome)   . Prediabetes   . Sleep apnea   . Vaginal Pap smear, abnormal   . Vitamin D deficiency    Patient Active Problem List   Diagnosis Date Noted  . Palpitations 08/30/2018  . Obstructive sleep apnea 08/30/2018  . Pain of left calf 12/14/2017  . Other fatigue 10/11/2017  . Shortness of breath on exertion 10/11/2017  . Prediabetes 10/11/2017  . Chronic left-sided thoracic back pain 05/23/2017  . Acute left-sided thoracic back pain 05/09/2017  . Migraine 05/09/2017  . Essential hypertension 05/09/2017  . Common migraine with intractable migraine 02/02/2017  . Chronic fatigue 01/06/2016  . Fatty liver 02/15/2015  . Esophageal reflux 02/15/2015  . Late menses 07/30/2014  . Morbid obesity ([place]) 07/30/2014  . Asthma without acute exacerbation 07/30/2014  . IBS (irritable bowel  syndrome) 07/30/2014  . Elevated BP 07/30/2014  . PCOS (polycystic ovarian syndrome) 11/25/2012  . Carpal tunnel syndrome, bilateral    Home Medication(s) Prior to Admission medications   Medication Sig Start Date End Date Taking? Authorizing Provider  acetaminophen (TYLENOL) 500 MG tablet Take 500 mg by mouth every 6 (six) hours as needed.    [provider]  Ascorbic Acid (VITAMIN C) 1000 MG tablet Take 1,000 mg by mouth daily.    [provider]  BIOTIN 5000 PO Take 10,000 mg by mouth daily.     [provider]  buPROPion (WELLBUTRIN SR) 150 MG 12 hr tablet Take 1 tablet (150 mg total) by mouth every morning. 08/07/19   Dennard Nip D, MD  Cyanocobalamin (VITAMIN B 12 PO) Take 2,500 mcg by mouth daily.    [provider]  medroxyPROGESTERone (PROVERA) 10 MG tablet Take 1 tablet (10 mg total) by mouth daily. Use for ten days 05/14/19   Lavonia Drafts, MD  metFORMIN (GLUCOPHAGE) 500 MG tablet Take 1 tablet (500 mg total) by mouth daily with breakfast. 03/11/19   Carollee Herter, Alferd Apa, DO  metoprolol succinate (TOPROL-XL) 100 MG 24 hr tablet Take 100 mg by mouth daily. Take with or immediately following a meal.    [provider]  Multiple Vitamins-Calcium (ONE-A-DAY WOMENS PO) Take 1 tablet by mouth daily.     [provider]  Vitamin D, Ergocalciferol, (DRISDOL) 1.25 MG (50000 UT) CAPS capsule Take 1 capsule (50,000 Units total) by mouth every 7 (seven) days. 08/07/19   Starlyn Skeans, MD                                                                                                                                    Past Surgical History Past Surgical History:  Procedure Laterality Date  . TONSILECTOMY, ADENOIDECTOMY, BILATERAL MYRINGOTOMY AND TUBES  1994   Family History Family History  Problem Relation Age of Onset  . Hypertension Mother   . Hyperlipidemia Mother   . Obesity Mother   . Heart disease Mother   . Stroke  Father   . Obesity Father   . Alcoholism Father   . Drug abuse Father   . Sudden death Father   . Diabetes Maternal Uncle   . Heart disease Maternal Grandmother   . Colon cancer Neg Hx     Social History Social History   Tobacco Use  . Smoking status: Former Smoker    Types: Cigarettes  . Smokeless tobacco: Never Used  Substance Use Topics  . Alcohol use: Yes    Alcohol/week: 3.0 standard drinks    Types: 3 Glasses of wine per week  . Drug use: No    Types: Marijuana    Comment: once every 6 months, taking CBD   Allergies Ibuprofen and Meloxicam  Review of Systems Review of Systems  Constitutional: Negative for fatigue and fever.  Respiratory: Positive for shortness of breath (due to pain). Negative for cough.   Cardiovascular: Positive for chest pain.  Gastrointestinal: Negative for nausea and vomiting.  Musculoskeletal: Positive for back pain.   All other systems are reviewed and are negative for acute change except as noted in the HPI  Physical Exam Vital Signs  I have reviewed the triage vital signs BP (!) 148/113 (BP Location: Left Wrist)   Pulse 83   Temp 98.5 F (36.9 C) (Oral)   Resp 20   LMP 06/17/2019   SpO2 100%   Physical Exam Vitals reviewed.  Constitutional:      General: She is not in acute distress.    Appearance: She is well-developed. She is morbidly obese. She is not diaphoretic.  HENT:     Head: Normocephalic and atraumatic.     Nose: Nose normal.  Eyes:     General: No scleral icterus.       Right eye: No discharge.        Left eye: No discharge.     Conjunctiva/sclera: Conjunctivae normal.     Pupils: Pupils are equal, round, and reactive to light.  Cardiovascular:     Rate and Rhythm: Normal rate and regular rhythm.     Heart sounds: No murmur. No friction rub. No gallop.   Pulmonary:     Effort: Pulmonary effort is normal. No respiratory distress.  Breath sounds: Normal breath sounds. No stridor. No rales.  Chest:      Chest wall: Tenderness present.    Abdominal:     General: There is no distension.     Palpations: Abdomen is soft.     Tenderness: There is no abdominal tenderness.  Musculoskeletal:     Cervical back: Normal range of motion and neck supple. Spasms and tenderness present. No bony tenderness.     Thoracic back: Spasms and tenderness present. No bony tenderness.  Skin:    General: Skin is warm and dry.     Findings: No erythema or rash.  Neurological:     Mental Status: She is alert and oriented to person, place, and time.     ED Results and Treatments Labs (all labs ordered are listed, but only abnormal results are displayed) Labs Reviewed  BASIC METABOLIC PANEL - Abnormal; Notable for the following components:      Result Value   Glucose, Bld 142 (*)    All other components within normal limits  CBC  TROPONIN I (HIGH SENSITIVITY)                                                                                                                         EKG  EKG Interpretation  Date/Time:  Wednesday September 03 2019 21:19:05 EST Ventricular Rate:  87 PR Interval:  162 QRS Duration: 74 QT Interval:  378 QTC Calculation: 454 R Axis:   24 Text Interpretation: Normal sinus rhythm Normal ECG NO STEMI. Confirmed by Addison Lank [phone]) on 09/03/2019 11:18:44 PM      Radiology DG Chest 2 View  Result Date: 09/03/2019 CLINICAL DATA:  Chest pain EXAM: CHEST - 2 VIEW COMPARISON:  12/03/2018 FINDINGS: Cardiac shadow is within normal limits. The lungs are well aerated bilaterally. No focal infiltrate or sizable effusion is seen. No bony abnormality is noted. IMPRESSION: No active cardiopulmonary disease. Electronically Signed   By: Inez Catalina M.D.   On: 09/03/2019 21:39    Pertinent labs & imaging results that were available during my care of the patient were reviewed by me and considered in my medical decision making (see chart for details).  Medications Ordered in ED Medications    sodium chloride flush (NS) 0.9 % injection 3 mL ( Intravenous Canceled Entry 09/03/19 2315)  ketorolac (TORADOL) injection 30 mg (has no administration in time range)  Procedures Procedures  (including critical care time)  Medical Decision Making / ED Course I have reviewed the nursing notes for this encounter and the patient's prior records (if available in EHR or on provided paperwork).   CHAE HOLOHAN was evaluated in Emergency Department on 09/04/2019 for the symptoms described in the history of present illness. She was evaluated in the context of the global COVID-19 pandemic, which necessitated consideration that the patient might be at risk for infection with the SARS-CoV-2 virus that causes COVID-19. Institutional protocols and algorithms that pertain to the evaluation of patients at risk for COVID-19 are in a state of rapid change based on information released by regulatory bodies including the CDC and federal and state organizations. These policies and algorithms were followed during the patient's care in the ED.  Dull constant chest pain and back pain for 3 days.  Most suspicious for muscle strain/spasm of the upper thoracic region. EKG without acute ischemic changes or evidence of pericarditis.  Triage troponin negative.  Given low suspicion of, atypical nature of symptoms, and duration of symptoms, feel this is sufficient to rule out ACS. Low suspicion for pulmonary embolism. Presentation not classic for aortic dissection or esophageal perforation.  Chest x-ray without evidence suggestive of pneumonia, pneumothorax, pneumomediastinum.  No abnormal contour of the mediastinum to suggest dissection. No evidence of acute injuries.  The patient appears reasonably screened and/or stabilized for discharge and I doubt any other medical condition or other [facility]  requiring further screening, evaluation, or treatment in the ED at this time prior to discharge.  The patient is safe for discharge with strict return precautions.       Final Clinical Impression(s) / ED Diagnoses Final diagnoses:  Muscle strain of chest wall, initial encounter  Muscle strain of upper back     The patient appears reasonably screened and/or stabilized for discharge and I doubt any other medical condition or other [facility] requiring further screening, evaluation, or treatment in the ED at this time prior to discharge.  Disposition: Discharge  Condition: Good  I have discussed the results, Dx and Tx plan with the patient who expressed understanding and agree(s) with the plan. Discharge instructions discussed at great length. The patient was given strict return precautions who verbalized understanding of the instructions. No further questions at time of discharge.    ED Discharge Orders    None       Follow Up: Ann Held, DO [address] [place] [id number] [phone]  Schedule an appointment as soon as possible for a visit       This chart was dictated using voice recognition software.  Despite best efforts to proofread,  errors can occur which can change the documentation meaning.   Fatima Blank, MD 09/04/19 [phone]

## 2019-09-03 NOTE — ED Notes (Signed)
Patient transported to X-ray 

## 2019-09-04 MED ORDER — KETOROLAC TROMETHAMINE 60 MG/2ML IM SOLN
30.0000 mg | Freq: Once | INTRAMUSCULAR | Status: AC
  Administered 2019-09-04: 30 mg via INTRAMUSCULAR
  Filled 2019-09-04: qty 2

## 2019-09-04 NOTE — Discharge Instructions (Signed)
You may use over-the-counter Motrin (Ibuprofen), Acetaminophen (Tylenol), topical muscle creams such as SalonPas, Icy Hot, Bengay, etc. Please stretch, apply heat, and have massage therapy for additional assistance. ° °

## 2019-09-05 ENCOUNTER — Other Ambulatory Visit (INDEPENDENT_AMBULATORY_CARE_PROVIDER_SITE_OTHER): Payer: Self-pay | Admitting: Family Medicine

## 2019-09-05 DIAGNOSIS — F3289 Other specified depressive episodes: Secondary | ICD-10-CM

## 2019-09-09 MED ORDER — METOPROLOL SUCCINATE ER 100 MG PO TB24: 100.0000 mg | ORAL_TABLET | Freq: Every day | ORAL | 0 refills | Status: DC

## 2019-09-09 MED ORDER — VITAMIN D (ERGOCALCIFEROL) 1.25 MG (50000 UNIT) PO CAPS: 50000.0000 [IU] | ORAL_CAPSULE | ORAL | 0 refills | Status: DC

## 2019-09-09 MED ORDER — BUPROPION HCL ER (SR) 150 MG PO TB12: 150.0000 mg | ORAL_TABLET | ORAL | 0 refills | Status: DC

## 2019-09-09 NOTE — Progress Notes (Signed)
Chief Complaint:   OBESITY Brandy Saunders is here to discuss her progress with her obesity treatment plan along with follow-up of her obesity related diagnoses. Brandy Saunders is practicing portion control and making smarter food choices, such as increasing vegetables and decreasing simple carbohydrates and states she is following her eating plan approximately 0% of the time. Brandy Saunders states she is active while walking the dog and also moving her household.  Today's visit was #: 21 Starting weight: 447 lbs Starting date: 10/11/17 Today's weight: 441 lbs Today's date: 09/03/2019 Total lbs lost to date: 6 Total lbs lost since last in-office visit: 0  Interim History: Brandy Saunders did some celebration eating over the holidays and has gained weight, but she states she is ready to get back on track.  Subjective:   1. Essential hypertension [name]'s blood pressure is borderline elevated today. Her blood pressure is normally well controlled and she notes increased back pain recently.  2. Vitamin D deficiency [name] is stable on Vit D, and she denies nausea, vomiting, or muscle weakness.  3. Right low back pain, unspecified chronicity, unspecified whether sciatica present Brandy Saunders has had worsening back pain which is "burning" in nature. She knows her weight can contribute to the pain, but she has not had a extensive work up.  4. Other depression,with emotional eating Brandy Saunders is stable on Wellbutrin. Her blood pressure is slightly elevated today, but normally stable. She requests a refill today.  5. At risk for heart disease Brandy Saunders is at a higher than average risk for cardiovascular disease due to obesity. Reviewed: no chest pain on exertion, no dyspnea on exertion, and no swelling of ankles.  Assessment/Plan:   1. Essential hypertension [name] is working on healthy weight loss and exercise to improve blood pressure control. We will watch for signs of hypotension as she continues  her lifestyle modifications. We will refill metoprolol for 1 month, and will continue to follow up.  - metoprolol succinate (TOPROL-XL) 100 MG 24 hr tablet; Take 1 tablet (100 mg total) by mouth daily. Take with or immediately following a meal. Dispense: 30 tablet; Refill: 0  2. Vitamin D deficiency Low Vitamin D level contributes to fatigue and are associated with obesity, breast, and colon cancer. We will refill prescription Vit D for 1 month. She will follow-up for routine testing of vitamin D, at least 2-3 times per year to avoid over-replacement. We will continue to monitor.  - Vitamin D, Ergocalciferol, (DRISDOL) 1.25 MG (50000 UNIT) CAPS capsule; Take 1 capsule (50,000 Units total) by mouth every 7 (seven) days. Dispense: 4 capsule; Refill: 0.  3. Right low back pain, unspecified chronicity, unspecified whether sciatica present We have sent a referral to Sports Medicine with Dr. Lynne Leader for evaluation.  - Ambulatory referral to Sports Medicine  4. Other depression,with emotional eating Behavior modification techniques were discussed today to help [name] deal with her emotional/non-hunger eating behaviors. We will refill Wellbutrin for 1 month, and will continue to monitor. Orders and follow up as documented in patient record.   - buPROPion (WELLBUTRIN SR) 150 MG 12 hr tablet; Take 1 tablet (150 mg total) by mouth every morning. Dispense: 30 tablet; Refill: 0  5. At risk for heart disease [name] was given approximately 15 minutes of coronary artery disease prevention counseling today. She is 29 y.o. female and has risk factors for heart disease including obesity. We discussed intensive lifestyle modifications today with an emphasis on specific weight loss instructions and strategies.   6.  Class 3 severe obesity with serious comorbidity and body mass index (BMI) greater than or equal to 70 in adult, unspecified obesity type (HCC) Brandy Saunders is currently in the action stage of change.  As such, her goal is to continue with weight loss efforts. She has agreed to keeping a food journal and adhering to recommended goals of 1600-2000 calories and 100+ grams of protein daily.   We discussed the following exercise goals today: For substantial health benefits, adults should do at least 150 minutes (2 hours and 30 minutes) a week of moderate-intensity, or 75 minutes (1 hour and 15 minutes) a week of vigorous-intensity aerobic physical activity, or an equivalent combination of moderate- and vigorous-intensity aerobic activity. Aerobic activity should be performed in episodes of at least 10 minutes, and preferably, it should be spread throughout the week. Adults should also include muscle-strengthening activities that involve all major muscle groups on 2 or more days a week.  We discussed the following behavioral modification strategies today: increasing lean protein intake, meal planning and cooking strategies, emotional eating strategies and planning for success.  Brandy Saunders has agreed to follow-up with our clinic in 2 to 3 weeks. She was informed of the importance of frequent follow-up visits to maximize her success with intensive lifestyle modifications for her multiple health conditions.   Objective:   Blood pressure 140/78, pulse 81, temperature 97.9 F (36.6 C), temperature source Oral, height 5\' 5"  (1.651 m), weight (!) 441 lb (200 kg), last menstrual period 06/17/2019, SpO2 98 %. Body mass index is 73.39 kg/m.  General: Cooperative, alert, well developed, in no acute distress. HEENT: Conjunctivae and lids unremarkable. Neck: No thyromegaly.  Cardiovascular: Regular rhythm.  Lungs: Normal work of breathing. Extremities: No edema.  Neurologic: No focal deficits.   Lab Results  Component Value Date   CREATININE 0.81 09/03/2019   BUN 14 09/03/2019   NA 138 09/03/2019   K 3.8 09/03/2019   CL 103 09/03/2019   CO2 24 09/03/2019   Lab Results  Component Value Date     ALT 35 (H) 06/17/2019   AST 26 06/17/2019   ALKPHOS 64 06/17/2019   BILITOT 0.5 06/17/2019   Lab Results  Component Value Date   HGBA1C 5.8 (H) 06/17/2019   HGBA1C 5.8 (H) 05/02/2018   HGBA1C 5.7 (H) 01/17/2018   HGBA1C 6.0 (H) 10/11/2017   HGBA1C 6.1 09/13/2017   Lab Results  Component Value Date   INSULIN 35.9 (H) 06/17/2019   INSULIN 30.3 (H) 05/02/2018   INSULIN 39.5 (H) 01/17/2018   INSULIN 73.6 (H) 10/11/2017   Lab Results  Component Value Date   TSH 2.050 06/17/2019   Lab Results  Component Value Date   CHOL 153 06/17/2019   HDL 52 06/17/2019   LDLCALC 79 06/17/2019   TRIG 127 06/17/2019   CHOLHDL 3 03/10/2019   Lab Results  Component Value Date   WBC 6.3 09/03/2019   HGB 13.4 09/03/2019   HCT 41.2 09/03/2019   MCV 85.7 09/03/2019   PLT 248 09/03/2019   No results found for: IRON, TIBC, FERRITIN  Attestation Statements:   Reviewed by clinician on day of visit: allergies, medications, problem list, medical history, surgical history, family history, social history, and previous encounter notes.   I, Trixie Dredge, am acting as transcriptionist for Dennard Nip, MD.  I have reviewed the above documentation for accuracy and completeness, and I agree with the above. -  Dennard Nip, MD

## 2019-09-10 ENCOUNTER — Ambulatory Visit (INDEPENDENT_AMBULATORY_CARE_PROVIDER_SITE_OTHER): Payer: BC Managed Care – PPO | Admitting: Family Medicine

## 2019-09-10 ENCOUNTER — Encounter: Payer: Self-pay | Admitting: Family Medicine

## 2019-09-10 ENCOUNTER — Other Ambulatory Visit: Payer: Self-pay

## 2019-09-10 VITALS — BP 108/70 | HR 66 | Ht 65.0 in | Wt >= 6400 oz

## 2019-09-10 DIAGNOSIS — M7062 Trochanteric bursitis, left hip: Secondary | ICD-10-CM | POA: Diagnosis not present

## 2019-09-10 DIAGNOSIS — M545 Low back pain, unspecified: Secondary | ICD-10-CM

## 2019-09-10 DIAGNOSIS — M546 Pain in thoracic spine: Secondary | ICD-10-CM

## 2019-09-10 MED ORDER — CYCLOBENZAPRINE HCL 5 MG PO TABS: 5.0000 mg | ORAL_TABLET | Freq: Three times a day (TID) | ORAL | 1 refills | Status: DC | PRN

## 2019-09-10 NOTE — Progress Notes (Signed)
Subjective:    I'm seeing this patient as a consultation for:  Dr. Leafy Ro. Note will be routed back to referring provider/PCP.  CC: L-sided Low back pain  I, Brandy Saunders, LAT, ATC, am serving as scribe for Dr. Lynne Leader.  HPI: Pt is a 29 y/o female presenting w/ c/o worsening low back pain.  She describes the pain as burning in nature.  She reports pain in her L mid-low back that radiates into the L lateral mid-thigh.  She rates her back pain at a 5/10 that she describes as a sharp, knife-twisting and burning type pain.  Aggravating factors include prolonged sitting and walking.  She denies any numbness or tingling into her B LEs.  She has tried Tylenol and stretching w/ little relief.    Past medical history, Surgical history, Family history, Social history, Allergies, and medications have been entered into the medical record, reviewed.   Review of Systems: No headache, visual changes, nausea, vomiting, diarrhea, constipation, dizziness, abdominal pain, skin rash, fevers, chills, night sweats, weight loss, swollen lymph nodes, body aches, joint swelling, muscle aches, chest pain, shortness of breath, mood changes, visual or auditory hallucinations.   Objective:    Vitals:   09/10/19 1110  BP: 108/70  Pulse: 66  SpO2: 95%   General: Well Developed, well nourished, and in no acute distress.  Neuro/Psych: Alert and oriented x3, extra-ocular muscles intact, able to move all 4 extremities, sensation grossly intact. Skin: Warm and dry, no rashes noted.  Respiratory: Not using accessory muscles, speaking in full sentences, trachea midline.  Cardiovascular: Pulses palpable, no extremity edema. Abdomen: Does not appear distended. MSK:  T-spine: Normal-appearing Nontender spinal midline. Mildly tender palpation bilateral thoracic paraspinal musculature. Decreased thoracic range of motion to rotation and lateral flexion. Lumbar spine: Normal-appearing nontender midline.  Mildly tender  palpation left lumbar paraspinal musculature. Lumbar motion limited mildly to rotation lateral flexion bilaterally normal flexion extension range of motion. Reflexes and equal normal bilateral lower extremities. Lower extremity strength is intact as above noted below.  Left hip: Normal-appearing normal motion.  Mildly tender palpation greater trochanter.  Hip abduction strength 4+/5.  External rotation internal rotation and adduction strength normal.  Lab and Radiology Results  X-ray images of thoracic spine seen on chest x-ray dated September 02, 2018 personally and independently reviewed no significant degenerative changes.  No acute fractures.  X-ray images of lower portion of thoracic spine and lumbosacral spine and hip from CT scan abdomen and pelvis dated April 08, 2019 personally and independently reviewed. No severe degenerative changes.  No acute fractures.  Impression and Recommendations:    Assessment and Plan: 29 y.o. female with multifactorial pain. Patient has pain in paraspinal musculature of thoracic spine, left lumbar paraspinal muscle, and lateral hip pain.  Thoracic spine pain: Paraspinal musculature and periscapular musculature involved.  Patient will benefit significantly from physical therapy.  Plan to proceed with trial of physical therapy.  Limited cyclobenzaprine also prescribed.  Recheck back in 1 month.  Lumbar pain: Again myofascial dysfunction and spasm cause of pain.  Again physical therapy should help with core strength to reduce pain.  Limited cyclobenzaprine prescribed.  Recheck 1 month.  Additionally recommend heating pad and TENS unit.  Left lateral hip pain: Trochanteric bursitis/hip abductor tendinopathy.  Home exercise program reviewed.  Additionally recommend physical therapy.  Limited cyclobenzaprine TENS unit heating pad.  Check back in 1 month..   Orders Placed This Encounter  Procedures  . Ambulatory referral to Physical Therapy  Referral  Priority:   Routine    Referral Type:   Physical Medicine    Referral Reason:   Specialty Services Required    Requested Specialty:   Physical Therapy   Meds ordered this encounter  Medications  . cyclobenzaprine (FLEXERIL) 5 MG tablet    Sig: Take 1-2 tablets (5-10 mg total) by mouth 3 (three) times daily as needed for muscle spasms.    Dispense:  60 tablet    Refill:  1    Discussed warning signs or symptoms. Please see discharge instructions. Patient expresses understanding.   The above documentation has been reviewed and is accurate and complete Lynne Leader

## 2019-09-10 NOTE — Patient Instructions (Addendum)
Thank you for coming in today. Attend PT.  Try heating pad and TENS unit.  Try the cyclobenzaprine muscle relaxer.   Please perform the exercise program that we have prepared for you and gone over in detail on a daily basis.  In addition to the handout you were provided you can access your program through: [email or web address]   Your unique program code is: : [id number] and  [id number]  TENS UNIT: This is helpful for muscle pain and spasm.   Search and Purchase a TENS 7000 2nd edition at  www.[email or web address] or [email or web address] It should be less than $30.     TENS unit instructions: Do not shower or bathe with the unit on Turn the unit off before removing electrodes or batteries If the electrodes lose stickiness add a drop of water to the electrodes after they are disconnected from the unit and place on plastic sheet. If you continued to have difficulty, call the TENS unit company to purchase more electrodes. Do not apply lotion on the skin area prior to use. Make sure the skin is clean and dry as this will help prolong the life of the electrodes. After use, always check skin for unusual red areas, rash or other skin difficulties. If there are any skin problems, does not apply electrodes to the same area. Never remove the electrodes from the unit by pulling the wires. Do not use the TENS unit or electrodes other than as directed. Do not change electrode placement without consultating your therapist or physician. Keep 2 fingers with between each electrode. Wear time ratio is 2:1, on to off times.    For example on for 30 minutes off for 15 minutes and then on for 30 minutes off for 15 minutes     Lumbosacral Strain Lumbosacral strain is an injury that causes pain in the lower back (lumbosacral spine). This injury usually happens from overstretching the muscles or ligaments along your spine. Ligaments are cord-like tissues that connect bones to other bones. A strain can affect one or more  muscles or ligaments. What are the causes? This condition may be caused by:  A hard, direct hit to the back.  Overstretching the lower back muscles. This may result from: ? A fall. ? Lifting something heavy. ? Repetitive movements such as bending or crouching. What increases the risk? The following factors may make you more likely to develop this condition:  Participating in sports or activities that involve: ? A sudden twist of the back. ? Pushing or pulling motions.  Being overweight or obese.  Having poor strength and flexibility, especially tight hamstrings or weak muscles in the back or abdomen.  Having too much of a curve in the lower back.  Having a pelvis that is tilted forward. What are the signs or symptoms? The main symptom of this condition is pain in the lower back, at the site of the strain. Pain may also be felt down one or both legs. How is this diagnosed? This condition is diagnosed based on your symptoms, your medical history, and a physical exam. During the physical exam, your health care provider may push on certain areas of your back to find the source of your pain. You may be asked to bend forward, backward, and side to side to check your pain and range of motion. You may also have imaging tests, such as X-rays and an MRI. How is this treated? This condition may be treated by:  Applying heat and cold on the affected area.  Taking medicines to help relieve pain and relax your muscles.  Taking NSAIDs, such as ibuprofen, to help reduce swelling and discomfort.  Doing stretching and strengthening exercises for your lower back. Symptoms usually improve within several weeks of treatment. However, recovery time varies. When your symptoms improve, gradually return to your normal routine as soon as possible to reduce pain, avoid stiffness, and keep muscle strength. Follow these instructions at home: Medicines  Take over-the-counter and prescription medicines  only as told by your health care provider.  Ask your health care provider if the medicine prescribed to you: ? Requires you to avoid driving or using heavy machinery. ? Can cause constipation. You may need to take these actions to prevent or treat constipation:  Drink enough fluid to keep your urine pale yellow.  Take over-the-counter or prescription medicines.  Eat foods that are high in fiber, such as beans, whole grains, and fresh fruits and vegetables.  Limit foods that are high in fat and processed sugars, such as fried or sweet foods. Managing pain, stiffness, and swelling      If directed, put ice on the injured area. To do this: ? Put ice in a plastic bag. ? Place a towel between your skin and the bag. ? Leave the ice on for 20 minutes, 2-3 times a day.  If directed, apply heat on the affected area as often as told by your health care provider. Use the heat source that your health care provider recommends, such as a moist heat pack or a heating pad. ? Place a towel between your skin and the heat source. ? Leave the heat on for 20-30 minutes. ? Remove the heat if your skin turns bright red. This is especially important if you are unable to feel pain, heat, or cold. You may have a greater risk of getting burned. Activity  Rest as told by your health care provider.  Do not stay in bed. Staying in bed for more than 1-2 days can delay your recovery.  Return to your normal activities as told by your health care provider. Ask your health care provider what activities are safe for you.  Avoid activities that take a lot of energy for as long as told by your health care provider.  Do exercises as told by your health care provider. This includes stretching and strengthening exercises. General instructions  Sit up and stand up straight. Avoid leaning forward when you sit, or hunching over when you stand.  Do not use any products that contain nicotine or tobacco, such as  cigarettes, e-cigarettes, and chewing tobacco. If you need help quitting, ask your health care provider.  Keep all follow-up visits as told by your health care provider. This is important. How is this prevented?   Use correct form when playing sports and lifting heavy objects.  Use good posture when sitting and standing.  Maintain a healthy weight.  Sleep on a mattress with medium firmness to support your back.  Do at least 150 minutes of moderate-intensity exercise each week, such as brisk walking or water aerobics. Try a form of exercise that takes stress off your back, such as swimming or stationary cycling.  Maintain physical fitness, including: ? Strength. ? Flexibility. Contact a health care provider if:  Your back pain does not improve after several weeks of treatment.  Your symptoms get worse. Get help right away if:  Your back pain is severe.  You cannot stand or walk.  You have difficulty controlling when  you urinate or when you have a bowel movement.  You feel nauseous or you vomit.  Your feet or legs get very cold, turn pale, or look blue.  You have numbness, tingling, weakness, or problems using your arms or legs.  You develop any of the following: ? Shortness of breath. ? Dizziness. ? Pain in your legs. ? Weakness in your buttocks or legs. Summary  Lumbosacral strain is an injury that causes pain in the lower back (lumbosacral spine).  This injury usually happens from overstretching the muscles or ligaments along your spine.  This condition may be caused by a direct hit to the lower back or by overstretching the lower back muscles.  Symptoms usually improve within several weeks of treatment. This information is not intended to replace advice given to you by your health care provider. Make sure you discuss any questions you have with your health care provider. Document Revised: 01/07/2019 Document Reviewed: 01/07/2019 Elsevier Patient Education   [place].

## 2019-09-18 ENCOUNTER — Ambulatory Visit (INDEPENDENT_AMBULATORY_CARE_PROVIDER_SITE_OTHER): Payer: BC Managed Care – PPO | Admitting: Family Medicine

## 2019-09-18 ENCOUNTER — Other Ambulatory Visit: Payer: Self-pay

## 2019-09-18 ENCOUNTER — Ambulatory Visit: Payer: BC Managed Care – PPO | Admitting: Family Medicine

## 2019-09-18 ENCOUNTER — Encounter: Payer: Self-pay | Admitting: Family Medicine

## 2019-09-18 VITALS — BP 130/80 | HR 67 | Temp 97.4°F | Resp 18 | Ht 65.0 in | Wt >= 6400 oz

## 2019-09-18 DIAGNOSIS — I10 Essential (primary) hypertension: Secondary | ICD-10-CM | POA: Diagnosis not present

## 2019-09-18 DIAGNOSIS — M62830 Muscle spasm of back: Secondary | ICD-10-CM | POA: Insufficient documentation

## 2019-09-18 DIAGNOSIS — E785 Hyperlipidemia, unspecified: Secondary | ICD-10-CM | POA: Diagnosis not present

## 2019-09-18 MED ORDER — METOPROLOL SUCCINATE ER 100 MG PO TB24: 100.0000 mg | ORAL_TABLET | Freq: Every day | ORAL | 2 refills | Status: DC

## 2019-09-18 NOTE — Assessment & Plan Note (Signed)
Pt has muscle relaxer  Advised --- massage and or chiropractor---- pt actually has app with Dr Tamala Julian next month

## 2019-09-18 NOTE — Progress Notes (Signed)
Patient ID: Brandy Saunders, female    DOB: Feb 26, 1991  Age: 29 y.o. MRN: [id number]    Subjective:  Subjective  HPI Brandy Saunders presents for f/u bp and chol Pt is back t healthy weight and wellness   Review of Systems  Constitutional: Negative for appetite change, diaphoresis, fatigue and unexpected weight change.  Eyes: Negative for pain, redness and visual disturbance.  Respiratory: Negative for cough, chest tightness, shortness of breath and wheezing.   Cardiovascular: Negative for chest pain, palpitations and leg swelling.  Endocrine: Negative for cold intolerance, heat intolerance, polydipsia, polyphagia and polyuria.  Genitourinary: Negative for difficulty urinating, dysuria and frequency.  Musculoskeletal: Positive for back pain.  Neurological: Negative for dizziness, light-headedness, numbness and headaches.    History Past Medical History:  Diagnosis Date  . Alcohol abuse   . Anxiety   . Asthma   . Back pain   . Binge eating   . Carpal tunnel syndrome, bilateral   . Chest pain   . Common migraine with intractable migraine 02/02/2017  . Constipation   . Depression   . Dyspnea   . Fatty liver   . GERD (gastroesophageal reflux disease)   . Hypertension   . Joint pain   . Leg edema   . Morbidly obese ([place])   . Palpitations   . PCOS (polycystic ovarian syndrome)   . Prediabetes   . Sleep apnea   . Vaginal Pap smear, abnormal   . Vitamin D deficiency     She has a past surgical history that includes Tonsilectomy, adenoidectomy, bilateral myringotomy and tubes (1994).   Her family history includes Alcoholism in her father; Diabetes in her maternal uncle; Drug abuse in her father; Heart disease in her maternal grandmother and mother; Hyperlipidemia in her mother; Hypertension in her mother; Obesity in her father and mother; Stroke in her father; Sudden death in her father.She reports that she has quit smoking. Her smoking use included cigarettes. She has  never used smokeless tobacco. She reports current alcohol use of about 3.0 standard drinks of alcohol per week. She reports that she does not use drugs.  Current Outpatient Medications on File Prior to Visit  Medication Sig Dispense Refill  . acetaminophen (TYLENOL) 500 MG tablet Take 500 mg by mouth every 6 (six) hours as needed.    . Ascorbic Acid (VITAMIN C) 1000 MG tablet Take 1,000 mg by mouth daily.    Marland Kitchen BIOTIN 5000 PO Take 10,000 mg by mouth daily.     Marland Kitchen buPROPion (WELLBUTRIN SR) 150 MG 12 hr tablet Take 1 tablet (150 mg total) by mouth every morning. 30 tablet 0  . Cyanocobalamin (VITAMIN B 12 PO) Take 2,500 mcg by mouth daily.    . cyclobenzaprine (FLEXERIL) 5 MG tablet Take 1-2 tablets (5-10 mg total) by mouth 3 (three) times daily as needed for muscle spasms. 60 tablet 1  . medroxyPROGESTERone (PROVERA) 10 MG tablet Take 1 tablet (10 mg total) by mouth daily. Use for ten days 10 tablet 2  . metFORMIN (GLUCOPHAGE) 500 MG tablet Take 1 tablet (500 mg total) by mouth daily with breakfast. 90 tablet 1  . Multiple Vitamins-Calcium (ONE-A-DAY WOMENS PO) Take 1 tablet by mouth daily.     . Vitamin D, Ergocalciferol, (DRISDOL) 1.25 MG (50000 UNIT) CAPS capsule Take 1 capsule (50,000 Units total) by mouth every 7 (seven) days. 4 capsule 0   No current facility-administered medications on file prior to visit.     Objective:  Objective  Physical Exam BP 130/80 (BP Location: Right Arm, Patient Position: Sitting, Cuff Size: Large)   Pulse 67   Temp (!) 97.4 F (36.3 C) (Temporal)   Resp 18   Ht 5\' 5"  (1.651 m)   Wt (!) 440 lb (199.6 kg)   SpO2 98%   BMI 73.22 kg/m  Wt Readings from Last 3 Encounters:  09/18/19 (!) 440 lb (199.6 kg)  09/10/19 (!) 441 lb (200 kg)  09/03/19 (!) 441 lb (200 kg)     Lab Results  Component Value Date   WBC 6.3 09/03/2019   HGB 13.4 09/03/2019   HCT 41.2 09/03/2019   PLT 248 09/03/2019   GLUCOSE 142 (H) 09/03/2019   CHOL 153 06/17/2019   TRIG 127  06/17/2019   HDL 52 06/17/2019   LDLCALC 79 06/17/2019   ALT 35 (H) 06/17/2019   AST 26 06/17/2019   NA 138 09/03/2019   K 3.8 09/03/2019   CL 103 09/03/2019   CREATININE 0.81 09/03/2019   BUN 14 09/03/2019   CO2 24 09/03/2019   TSH 2.050 06/17/2019   HGBA1C 5.8 (H) 06/17/2019    DG Chest 2 View  Result Date: 09/03/2019 CLINICAL DATA:  Chest pain EXAM: CHEST - 2 VIEW COMPARISON:  12/03/2018 FINDINGS: Cardiac shadow is within normal limits. The lungs are well aerated bilaterally. No focal infiltrate or sizable effusion is seen. No bony abnormality is noted. IMPRESSION: No active cardiopulmonary disease. Electronically Signed   By: Inez Catalina M.D.   On: 09/03/2019 21:39     Assessment & Plan:  Plan  I am having Brandy Saunders maintain her Multiple Vitamins-Calcium (ONE-A-DAY WOMENS PO), BIOTIN 5000 PO, vitamin C, acetaminophen, Cyanocobalamin (VITAMIN B 12 PO), metFORMIN, medroxyPROGESTERone, buPROPion, Vitamin D (Ergocalciferol), cyclobenzaprine, and metoprolol succinate.  Meds ordered this encounter  Medications  . metoprolol succinate (TOPROL-XL) 100 MG 24 hr tablet    Sig: Take 1 tablet (100 mg total) by mouth daily. Take with or immediately following a meal.    Dispense:  30 tablet    Refill:  2    Problem List Items Addressed This Visit      Unprioritized   Essential hypertension    Well controlled, no changes to meds. Encouraged heart healthy diet such as the DASH diet and exercise as tolerated.       Relevant Medications   metoprolol succinate (TOPROL-XL) 100 MG 24 hr tablet   Other Relevant Orders   Comprehensive metabolic panel   Hyperlipidemia - Primary    Encouraged heart healthy diet, increase exercise, avoid trans fats, consider a krill oil cap daily      Relevant Medications   metoprolol succinate (TOPROL-XL) 100 MG 24 hr tablet   Other Relevant Orders   Comprehensive metabolic panel   Lipid panel   Morbid obesity ([place])    F/u healthy weight  and wellness       Relevant Orders   Insulin, random   Vitamin D (25 hydroxy)   Spasm of thoracic back muscle    Pt has muscle relaxer  Advised --- massage and or chiropractor---- pt actually has app with Dr Tamala Julian next month         Follow-up: Return in about 6 months (around 03/17/2020), or if symptoms worsen or fail to improve, for annual exam, fasting.  Ann Held, DO

## 2019-09-18 NOTE — Assessment & Plan Note (Signed)
Well controlled, no changes to meds. Encouraged heart healthy diet such as the DASH diet and exercise as tolerated.  °

## 2019-09-18 NOTE — Assessment & Plan Note (Signed)
Encouraged heart healthy diet, increase exercise, avoid trans fats, consider a krill oil cap daily 

## 2019-09-18 NOTE — Assessment & Plan Note (Signed)
F/u healthy weight and wellness

## 2019-09-18 NOTE — Patient Instructions (Addendum)
COVID-19 Vaccine Information can be found at: [email or web address] For questions related to vaccine distribution or appointments, please email [email or web address] or call [phone].       Hypertension, Adult High blood pressure (hypertension) is when the force of blood pumping through the arteries is too strong. The arteries are the blood vessels that carry blood from the heart throughout the body. Hypertension forces the heart to work harder to pump blood and may cause arteries to become narrow or stiff. Untreated or uncontrolled hypertension can cause a heart attack, heart failure, a stroke, kidney disease, and other problems. A blood pressure reading consists of a higher number over a lower number. Ideally, your blood pressure should be below 120/80. The first ("top") number is called the systolic pressure. It is a measure of the pressure in your arteries as your heart beats. The second ("bottom") number is called the diastolic pressure. It is a measure of the pressure in your arteries as the heart relaxes. What are the causes? The exact cause of this condition is not known. There are some conditions that result in or are related to high blood pressure. What increases the risk? Some risk factors for high blood pressure are under your control. The following factors may make you more likely to develop this condition:  Smoking.  Having type 2 diabetes mellitus, high cholesterol, or both.  Not getting enough exercise or physical activity.  Being overweight.  Having too much fat, sugar, calories, or salt (sodium) in your diet.  Drinking too much alcohol. Some risk factors for high blood pressure may be difficult or impossible to change. Some of these factors include:  Having chronic kidney disease.  Having a family history of high blood pressure.  Age. Risk increases with age.  Race. You may be at higher risk if you are  African American.  Gender. Men are at higher risk than women before age 52. After age 41, women are at higher risk than men.  Having obstructive sleep apnea.  Stress. What are the signs or symptoms? High blood pressure may not cause symptoms. Very high blood pressure (hypertensive crisis) may cause:  Headache.  Anxiety.  Shortness of breath.  Nosebleed.  Nausea and vomiting.  Vision changes.  Severe chest pain.  Seizures. How is this diagnosed? This condition is diagnosed by measuring your blood pressure while you are seated, with your arm resting on a flat surface, your legs uncrossed, and your feet flat on the floor. The cuff of the blood pressure monitor will be placed directly against the skin of your upper arm at the level of your heart. It should be measured at least twice using the same arm. Certain conditions can cause a difference in blood pressure between your right and left arms. Certain factors can cause blood pressure readings to be lower or higher than normal for a short period of time:  When your blood pressure is higher when you are in a health care provider's office than when you are at home, this is called white coat hypertension. Most people with this condition do not need medicines.  When your blood pressure is higher at home than when you are in a health care provider's office, this is called masked hypertension. Most people with this condition may need medicines to control blood pressure. If you have a high blood pressure reading during one visit or you have normal blood pressure with other risk factors, you may be asked to:  Return on a different day to have your  blood pressure checked again.  Monitor your blood pressure at home for 1 week or longer. If you are diagnosed with hypertension, you may have other blood or imaging tests to help your health care provider understand your overall risk for other conditions. How is this treated? This condition is  treated by making healthy lifestyle changes, such as eating healthy foods, exercising more, and reducing your alcohol intake. Your health care provider may prescribe medicine if lifestyle changes are not enough to get your blood pressure under control, and if:  Your systolic blood pressure is above 130.  Your diastolic blood pressure is above 80. Your personal target blood pressure may vary depending on your medical conditions, your age, and other factors. Follow these instructions at home: Eating and drinking   Eat a diet that is high in fiber and potassium, and low in sodium, added sugar, and fat. An example eating plan is called the DASH (Dietary Approaches to Stop Hypertension) diet. To eat this way: ? Eat plenty of fresh fruits and vegetables. Try to fill one half of your plate at each meal with fruits and vegetables. ? Eat whole grains, such as whole-wheat pasta, brown rice, or whole-grain bread. Fill about one fourth of your plate with whole grains. ? Eat or drink low-fat dairy products, such as skim milk or low-fat yogurt. ? Avoid fatty cuts of meat, processed or cured meats, and poultry with skin. Fill about one fourth of your plate with lean proteins, such as fish, chicken without skin, beans, eggs, or tofu. ? Avoid pre-made and processed foods. These tend to be higher in sodium, added sugar, and fat.  Reduce your daily sodium intake. Most people with hypertension should eat less than 1,500 mg of sodium a day.  Do not drink alcohol if: ? Your health care provider tells you not to drink. ? You are pregnant, may be pregnant, or are planning to become pregnant.  If you drink alcohol: ? Limit how much you use to:  0-1 drink a day for women.  0-2 drinks a day for men. ? Be aware of how much alcohol is in your drink. In the [place] one drink equals one 12 oz bottle of beer (355 mL), one 5 oz glass of wine (148 mL), or one 1 oz glass of hard liquor (44 mL). Lifestyle   Work with  your health care provider to maintain a healthy body weight or to lose weight. Ask what an ideal weight is for you.  Get at least 30 minutes of exercise most days of the week. Activities may include walking, swimming, or biking.  Include exercise to strengthen your muscles (resistance exercise), such as Pilates or lifting weights, as part of your weekly exercise routine. Try to do these types of exercises for 30 minutes at least 3 days a week.  Do not use any products that contain nicotine or tobacco, such as cigarettes, e-cigarettes, and chewing tobacco. If you need help quitting, ask your health care provider.  Monitor your blood pressure at home as told by your health care provider.  Keep all follow-up visits as told by your health care provider. This is important. Medicines  Take over-the-counter and prescription medicines only as told by your health care provider. Follow directions carefully. Blood pressure medicines must be taken as prescribed.  Do not skip doses of blood pressure medicine. Doing this puts you at risk for problems and can make the medicine less effective.  Ask your health care provider about side  effects or reactions to medicines that you should watch for. Contact a health care provider if you:  Think you are having a reaction to a medicine you are taking.  Have headaches that keep coming back (recurring).  Feel dizzy.  Have swelling in your ankles.  Have trouble with your vision. Get help right away if you:  Develop a severe headache or confusion.  Have unusual weakness or numbness.  Feel faint.  Have severe pain in your chest or abdomen.  Vomit repeatedly.  Have trouble breathing. Summary  Hypertension is when the force of blood pumping through your arteries is too strong. If this condition is not controlled, it may put you at risk for serious complications.  Your personal target blood pressure may vary depending on your medical conditions, your  age, and other factors. For most people, a normal blood pressure is less than 120/80.  Hypertension is treated with lifestyle changes, medicines, or a combination of both. Lifestyle changes include losing weight, eating a healthy, low-sodium diet, exercising more, and limiting alcohol. This information is not intended to replace advice given to you by your health care provider. Make sure you discuss any questions you have with your health care provider. Document Revised: 04/24/2018 Document Reviewed: 04/24/2018 Elsevier Patient Education  2020 [employer].

## 2019-09-22 ENCOUNTER — Encounter (INDEPENDENT_AMBULATORY_CARE_PROVIDER_SITE_OTHER): Payer: Self-pay | Admitting: Family Medicine

## 2019-09-22 ENCOUNTER — Ambulatory Visit (INDEPENDENT_AMBULATORY_CARE_PROVIDER_SITE_OTHER): Payer: BC Managed Care – PPO | Admitting: Family Medicine

## 2019-09-22 ENCOUNTER — Other Ambulatory Visit: Payer: Self-pay

## 2019-09-22 VITALS — BP 123/72 | HR 66 | Temp 98.0°F | Ht 65.0 in | Wt >= 6400 oz

## 2019-09-22 DIAGNOSIS — F3289 Other specified depressive episodes: Secondary | ICD-10-CM

## 2019-09-22 DIAGNOSIS — R7303 Prediabetes: Secondary | ICD-10-CM | POA: Diagnosis not present

## 2019-09-22 DIAGNOSIS — E559 Vitamin D deficiency, unspecified: Secondary | ICD-10-CM | POA: Diagnosis not present

## 2019-09-22 DIAGNOSIS — Z6841 Body Mass Index (BMI) 40.0 and over, adult: Secondary | ICD-10-CM

## 2019-09-22 DIAGNOSIS — Z9189 Other specified personal risk factors, not elsewhere classified: Secondary | ICD-10-CM | POA: Diagnosis not present

## 2019-09-22 MED ORDER — METFORMIN HCL 500 MG PO TABS: 500.0000 mg | ORAL_TABLET | Freq: Every day | ORAL | 1 refills | Status: DC

## 2019-09-22 MED ORDER — VITAMIN D (ERGOCALCIFEROL) 1.25 MG (50000 UNIT) PO CAPS: 50000.0000 [IU] | ORAL_CAPSULE | ORAL | 0 refills | Status: DC

## 2019-09-22 MED ORDER — BUPROPION HCL ER (SR) 150 MG PO TB12: 150.0000 mg | ORAL_TABLET | ORAL | 0 refills | Status: DC

## 2019-09-22 NOTE — Progress Notes (Signed)
Chief Complaint:   OBESITY Brandy Saunders is here to discuss her progress with her obesity treatment plan along with follow-up of her obesity related diagnoses. Brandy Saunders is on keeping a food journal and adhering to recommended goals of 1600-2000 calories and 100+ grams of protein and states she is following her eating plan approximately 100% of the time. Brandy Saunders states she is walking the dog 15 minutes 5 times per week.  Today's visit was #: 22 Starting weight: 447 lbs Starting date: 10/11/2017 Today's weight: 431 lbs Today's date: 09/22/2019 Total lbs lost to date: 16  Total lbs lost since last in-office visit: 10  Interim History: Brandy Saunders has done very well with journaling and weight loss. She is working on increasing her protein and feels she is doing well. Hunger is reasonably controlled. She still struggles with meal planning, but seems to be improving.  Subjective:   Pre-diabetes. Brandy Saunders has a diagnosis of prediabetes based on her elevated HgA1c and was informed this puts her at greater risk of developing diabetes. She continues to work on diet and exercise to decrease her risk of diabetes. She denies nausea or hypoglycemia. She is stable on metformin with decreased polyphagia and is doing well on the dose.  Lab Results  Component Value Date   HGBA1C 5.8 (H) 06/17/2019   Lab Results  Component Value Date   INSULIN 35.9 (H) 06/17/2019   INSULIN 30.3 (H) 05/02/2018   INSULIN 39.5 (H) 01/17/2018   INSULIN 73.6 (H) 10/11/2017   Vitamin D deficiency. Brandy Saunders is stable on Vitamin D. Level is not yet at goal.  Other depression,with emotional eating. Brandy Saunders is struggling with emotional eating and using food for comfort to the extent that it is negatively impacting her health. She has been working on behavior modification techniques to help reduce her emotional eating and has been somewhat successful. She shows no sign of suicidal or homicidal ideations. Brandy Saunders is  tolerating Wellbutrin, but notes mild increased irritability and an increased libido (which, unfortunately, her boyfriend does not feel). She has done well with decreased emotional eating and feels the medication benefits still outweigh the risks.  At high risk for dehydration. Brandy Saunders is at risk for dehydration due to rapid weight loss.  Assessment/Plan:   Pre-diabetes. Brandy Saunders will continue to work on weight loss, exercise, and decreasing simple carbohydrates to help decrease the risk of diabetes. She was given a refill on metFORMIN (GLUCOPHAGE) 500 MG tablet x1 month. She will have labs checked at her next visit.  Vitamin D deficiency. Low Vitamin D level contributes to fatigue and are associated with obesity, breast, and colon cancer. Refill was given for Vitamin D, Ergocalciferol, (DRISDOL) 1.25 MG (50000 UNIT) CAPS capsule x1 month. She will have labs at her next visit.  Other depression,with emotional eating. Behavior modification techniques were discussed today to help Brandy Saunders deal with her emotional/non-hunger eating behaviors.  Orders and follow up as documented in patient record. She was given a refill on buPROPion (WELLBUTRIN SR) 150 MG 12 hr tablet x1 month. Will follow.  At high risk for dehydration.  Brandy Saunders was given approximately 15 minutes dehydration prevention counseling today. Brandy Saunders is at risk for dehydration due to weight loss and current medication(s). She was encouraged to hydrate and monitor fluid status to avoid dehydration as well as weight loss plateaus.  Class 3 severe obesity with serious comorbidity and body mass index (BMI) greater than or equal to 70 in adult, unspecified obesity type ([place]).  Brandy Saunders is  currently in the action stage of change. As such, her goal is to continue with weight loss efforts. She has agreed to keeping a food journal and adhering to recommended goals of 1600-2000 calories and 100+ grams of protein.   Behavioral modification  strategies: increasing lean protein intake, increasing water intake and meal planning and cooking strategies.  Brandy Saunders has agreed to follow-up with our clinic in 2-3 weeks. She was informed of the importance of frequent follow-up visits to maximize her success with intensive lifestyle modifications for her multiple health conditions.   Objective:   Blood pressure 123/72, pulse 66, temperature 98 F (36.7 C), temperature source Oral, height 5\' 5"  (1.651 m), weight (!) 431 lb (195.5 kg), SpO2 98 %. Body mass index is 71.72 kg/m.  General: Cooperative, alert, well developed, in no acute distress. HEENT: Conjunctivae and lids unremarkable. Cardiovascular: Regular rhythm.  Lungs: Normal work of breathing. Neurologic: No focal deficits.   Lab Results  Component Value Date   CREATININE 0.81 09/03/2019   BUN 14 09/03/2019   NA 138 09/03/2019   K 3.8 09/03/2019   CL 103 09/03/2019   CO2 24 09/03/2019   Lab Results  Component Value Date   ALT 35 (H) 06/17/2019   AST 26 06/17/2019   ALKPHOS 64 06/17/2019   BILITOT 0.5 06/17/2019   Lab Results  Component Value Date   HGBA1C 5.8 (H) 06/17/2019   HGBA1C 5.8 (H) 05/02/2018   HGBA1C 5.7 (H) 01/17/2018   HGBA1C 6.0 (H) 10/11/2017   HGBA1C 6.1 09/13/2017   Lab Results  Component Value Date   INSULIN 35.9 (H) 06/17/2019   INSULIN 30.3 (H) 05/02/2018   INSULIN 39.5 (H) 01/17/2018   INSULIN 73.6 (H) 10/11/2017   Lab Results  Component Value Date   TSH 2.050 06/17/2019   Lab Results  Component Value Date   CHOL 153 06/17/2019   HDL 52 06/17/2019   LDLCALC 79 06/17/2019   TRIG 127 06/17/2019   CHOLHDL 3 03/10/2019   Lab Results  Component Value Date   WBC 6.3 09/03/2019   HGB 13.4 09/03/2019   HCT 41.2 09/03/2019   MCV 85.7 09/03/2019   PLT 248 09/03/2019   No results found for: IRON, TIBC, FERRITIN  Attestation Statements:   Reviewed by clinician on day of visit: allergies, medications, problem list, medical  history, surgical history, family history, social history, and previous encounter notes.  I, Michaelene Song, am acting as Location manager for Dennard Nip, MD   I have reviewed the above documentation for accuracy and completeness, and I agree with the above. -  Dennard Nip, MD

## 2019-09-24 ENCOUNTER — Ambulatory Visit: Payer: BC Managed Care – PPO | Attending: Family Medicine | Admitting: Physical Therapy

## 2019-09-24 ENCOUNTER — Encounter: Payer: Self-pay | Admitting: Physical Therapy

## 2019-09-24 ENCOUNTER — Other Ambulatory Visit: Payer: Self-pay

## 2019-09-24 DIAGNOSIS — M545 Low back pain, unspecified: Secondary | ICD-10-CM

## 2019-09-24 DIAGNOSIS — M546 Pain in thoracic spine: Secondary | ICD-10-CM | POA: Insufficient documentation

## 2019-09-24 NOTE — Therapy (Signed)
[address] [place], [place], [address] Phone: [phone]   Fax:  [phone]  Physical Therapy Evaluation  Patient Details  Name: Brandy Saunders MRN: [id number] Date of Birth: 14-Jul-1991 Referring Provider (PT): Georgina Snell   Encounter Date: 09/24/2019  PT End of Session - 09/24/19 1345    Visit Number  1    Number of Visits  30    Date for PT Re-Evaluation  11/22/19    Authorization Type  BCBS    PT Start Time  1300    PT Stop Time  1340    PT Time Calculation (min)  40 min    Activity Tolerance  Patient tolerated treatment well    Behavior During Therapy  [facility] for tasks assessed/performed       Past Medical History:  Diagnosis Date  . Alcohol abuse   . Anxiety   . Asthma   . Back pain   . Binge eating   . Carpal tunnel syndrome, bilateral   . Chest pain   . Common migraine with intractable migraine 02/02/2017  . Constipation   . Depression   . Dyspnea   . Fatty liver   . GERD (gastroesophageal reflux disease)   . Hypertension   . Joint pain   . Leg edema   . Morbidly obese ([place])   . Palpitations   . PCOS (polycystic ovarian syndrome)   . Prediabetes   . Sleep apnea   . Vaginal Pap smear, abnormal   . Vitamin D deficiency     Past Surgical History:  Procedure Laterality Date  . TONSILECTOMY, ADENOIDECTOMY, BILATERAL MYRINGOTOMY AND TUBES  1994    There were no vitals filed for this visit.   Subjective Assessment - 09/24/19 1259    Subjective  Patient presents with mid and low back pain for a few years, x-rays negative.  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  Reports some low back pain for more years, does report some tingling into the left mid thigh laterally    Pertinent History  PCOS    Limitations  Walking;Sitting;House hold activities    How long can you walk comfortably?  20 minutes    Diagnostic tests  negative  x-rays    Patient Stated Goals  be stronger, walk for exericses    Currently in Pain?  Yes    Pain Score  5     Pain Location  Back    Pain Orientation  Lower;Upper;Left    Pain Descriptors / Indicators  Tightness;Spasm    Pain Type  Acute pain    Pain Radiating Towards  some pain inthe left latreral leg    Pain Onset  More than a month ago    Pain Frequency  Constant    Aggravating Factors   pain up to 8/10 activity, grocery shopping, after work    Pain Relieving Factors  sitting, reclingin rest, pain at best a 2/10    Effect of Pain on Daily Activities  limits sitting, walking         [facility] PT Assessment - 09/24/19 0001      Assessment   Medical Diagnosis  LBP, thoracic pain    Referring Provider (PT)  Georgina Snell    Onset Date/Surgical Date  08/24/19    Prior Therapy  no      Precautions   Precautions  None  Balance Screen   Has the patient fallen in the past 6 months  No    Has the patient had a decrease in activity level because of a fear of falling?   No    Is the patient reluctant to leave their home because of a fear of falling?   No      Home Environment   Additional Comments  does housework,  has a dog, no stairs      Prior Function   Level of Independence  Independent    Vocation  Full time employment    Vocation Requirements  at desk    Leisure  likes Zumba, has not done in 2 years, walks the dog      Posture/Postural Control   Posture Comments  obese, fwd head, rounded shoulders      ROM / Strength   AROM / PROM / Strength  AROM;Strength      AROM   Overall AROM Comments  Lumbar ROM decreased 50% flexion other motions WNL's tightness for all with some pain in the left low back especially with right side bending      Strength   Overall Strength Comments  Hips strenght 4/5 with some left hip pain, knees 4/5      Flexibility   Soft Tissue Assessment /Muscle Length  yes    Hamstrings  HS tightness    Quadriceps  very tight  with some pain     Piriformis  good      Palpation   Palpation comment  tight and very tender in the left SI and buttock., some tenderness in the left lateral hip and GT area, very tender to any P/A pressure      Ambulation/Gait   Gait Comments  has increased lordosis with walking                Objective measurements completed on examination: See above findings.                PT Short Term Goals - 09/24/19 1349      PT SHORT TERM GOAL #1   Title  indepednent with initial HEP    Time  2    Period  Weeks    Status  New        PT Long Term Goals - 09/24/19 1350      PT LONG TERM GOAL #1   Title  understand posture and body mechanics    Time  8    Period  Weeks    Status  New      PT LONG TERM GOAL #2   Title  decrease pain overall 50%    Time  8    Period  Weeks    Status  New      PT LONG TERM GOAL #3   Title  increase passive SLR to 70 degrees without pain    Time  8    Period  Weeks    Status  New      PT LONG TERM GOAL #4   Title  report able to shop without difficulty    Time  8    Period  Weeks    Status  New             Plan - 09/24/19 1346    Clinical Impression Statement  Patient has had some low back, mid back andleft hip 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  X-rays were negative.  She  has very tendre area in the left SI, the left GT area, tight and tender in the parapsinals.  She is very tight in the HS, calves and quads, she had some pain with shot gun test.  Has to change postitions often, she would like to become more active and has questions about posture.    Stability/Clinical Decision Making  Stable/Uncomplicated    Clinical Decision Making  Low    Rehab Potential  Good    PT Frequency  2x / week    PT Duration  8 weeks    PT Treatment/Interventions  ADLs/Self Care Home Management;Electrical Stimulation;Cryotherapy;Iontophoresis 4mg /ml Dexamethasone;Moist Heat;Therapeutic activities;Therapeutic exercise;Manual  techniques;Patient/family education    PT Next Visit Plan  start gym activity for core/lumbar stability    Consulted and Agree with Plan of Care  Patient       Patient will benefit from skilled therapeutic intervention in order to improve the following deficits and impairments:  Pain, Improper body mechanics, Decreased mobility, Increased muscle spasms, Postural dysfunction, Decreased strength, Decreased range of motion, Impaired flexibility  Visit Diagnosis: Acute left-sided low back pain without sciatica  Pain in thoracic spine     Problem List Patient Active Problem List   Diagnosis Date Noted  . Hyperlipidemia 09/18/2019  . Spasm of thoracic back muscle 09/18/2019  . Palpitations 08/30/2018  . Obstructive sleep apnea 08/30/2018  . Pain of left calf 12/14/2017  . Other fatigue 10/11/2017  . Shortness of breath on exertion 10/11/2017  . Prediabetes 10/11/2017  . Chronic left-sided thoracic back pain 05/23/2017  . Acute left-sided thoracic back pain 05/09/2017  . Migraine 05/09/2017  . Essential hypertension 05/09/2017  . Common migraine with intractable migraine 02/02/2017  . Chronic fatigue 01/06/2016  . Fatty liver 02/15/2015  . Esophageal reflux 02/15/2015  . Late menses 07/30/2014  . Morbid obesity ([place]) 07/30/2014  . Asthma without acute exacerbation 07/30/2014  . IBS (irritable bowel syndrome) 07/30/2014  . Elevated BP 07/30/2014  . PCOS (polycystic ovarian syndrome) 11/25/2012  . Carpal tunnel syndrome, bilateral     Sumner Boast., PT 09/24/2019, 1:52 PM  [place] [address] [place], [place], [address] Phone: [phone]   Fax:  [phone]  Name: Brandy Saunders MRN: [id number] Date of Birth: [birth year]

## 2019-09-24 NOTE — Patient Instructions (Signed)
Access Code: [email or web address]  URL: [email or web address]  Date: 09/24/2019  Prepared by: Lum Babe   Exercises S[place]us Stretch on Wall - 5 reps - 2 sets - 30 hold - 2x daily - 7x weekly Seated Hamstring Stretch with Chair - 5 reps - 1 sets - 30 hold - 2x daily - 7x weekly Supine Quadriceps Stretch with Strap on Table - 10 reps - 2 sets - 20 hold - 2x daily - 7x weekly Supine Posterior Pelvic Tilt - 10 reps - 2 sets - 5 hold - 2x daily - 7x weekly

## 2019-09-25 DIAGNOSIS — F419 Anxiety disorder, unspecified: Secondary | ICD-10-CM | POA: Diagnosis not present

## 2019-09-30 DIAGNOSIS — Z7689 Persons encountering health services in other specified circumstances: Secondary | ICD-10-CM | POA: Diagnosis not present

## 2019-09-30 DIAGNOSIS — I10 Essential (primary) hypertension: Secondary | ICD-10-CM | POA: Diagnosis not present

## 2019-09-30 DIAGNOSIS — J45909 Unspecified asthma, uncomplicated: Secondary | ICD-10-CM | POA: Diagnosis not present

## 2019-09-30 DIAGNOSIS — Z20822 Contact with and (suspected) exposure to covid-19: Secondary | ICD-10-CM | POA: Diagnosis not present

## 2019-10-02 ENCOUNTER — Other Ambulatory Visit: Payer: Self-pay

## 2019-10-02 ENCOUNTER — Ambulatory Visit: Payer: BC Managed Care – PPO | Attending: Family Medicine | Admitting: Physical Therapy

## 2019-10-02 DIAGNOSIS — M546 Pain in thoracic spine: Secondary | ICD-10-CM | POA: Insufficient documentation

## 2019-10-02 DIAGNOSIS — M545 Low back pain, unspecified: Secondary | ICD-10-CM

## 2019-10-02 NOTE — Therapy (Signed)
[address] [place], [place], [address] Phone: [phone]   Fax:  [phone]  Physical Therapy Treatment  Patient Details  Name: Brandy Saunders MRN: [id number] Date of Birth: 1990/12/20 Referring Provider (PT): Marikay Alar Date: 10/02/2019  PT End of Session - 10/02/19 0923    Visit Number  2    Number of Visits  30    Date for PT Re-Evaluation  11/22/19    PT Start Time  0845    PT Stop Time  0940    PT Time Calculation (min)  55 min       Past Medical History:  Diagnosis Date  . Alcohol abuse   . Anxiety   . Asthma   . Back pain   . Binge eating   . Carpal tunnel syndrome, bilateral   . Chest pain   . Common migraine with intractable migraine 02/02/2017  . Constipation   . Depression   . Dyspnea   . Fatty liver   . GERD (gastroesophageal reflux disease)   . Hypertension   . Joint pain   . Leg edema   . Morbidly obese ([place])   . Palpitations   . PCOS (polycystic ovarian syndrome)   . Prediabetes   . Sleep apnea   . Vaginal Pap smear, abnormal   . Vitamin D deficiency     Past Surgical History:  Procedure Laterality Date  . TONSILECTOMY, ADENOIDECTOMY, BILATERAL MYRINGOTOMY AND TUBES  1994    There were no vitals filed for this visit.  Subjective Assessment - 10/02/19 0847    Subjective  stretches at home are going okay.just still tight and painful    Currently in Pain?  Yes    Pain Score  7     Pain Location  Back                       [facility] Adult PT Treatment/Exercise - 10/02/19 0001      Exercises   Exercises  Lumbar;Knee/Hip      Lumbar Exercises: Stretches   Active Hamstring Stretch  Right;Left   standing 10 resp 5 sec hold     Lumbar Exercises: Aerobic   Nustep  L4 5 min   burning left hip/thigh " like when I walk too long)     Lumbar Exercises: Standing   Other Standing Lumbar Exercises  trunk ext red tband 10 times      Lumbar  Exercises: Supine   Ab Set  15 reps;3 seconds    Other Supine Lumbar Exercises  feet on ball bridge, KTC and obl 10 each      Knee/Hip Exercises: Standing   Hip Abduction  Stengthening;Both;15 reps;Knee straight    Hip Extension  Stengthening;Both;15 reps;Knee straight      Modalities   Modalities  Electrical Stimulation;Moist Heat      Moist Heat Therapy   Number Minutes Moist Heat  15 Minutes    Moist Heat Location  Lumbar Spine      Electrical Stimulation   Electrical Stimulation Location  LB, left hip area    Electrical Stimulation Action  IFC    Electrical Stimulation Parameters  supiine    Electrical Stimulation Goals  Pain             PT Education - 10/02/19 0905    Education Details  desk ergonomics    Person(s) Educated  Patient    Methods  Explanation;Demonstration    Comprehension  Verbalized understanding;Returned demonstration       PT Short Term Goals - 09/24/19 1349      PT SHORT TERM GOAL #1   Title  indepednent with initial HEP    Time  2    Period  Weeks    Status  New        PT Long Term Goals - 09/24/19 1350      PT LONG TERM GOAL #1   Title  understand posture and body mechanics    Time  8    Period  Weeks    Status  New      PT LONG TERM GOAL #2   Title  decrease pain overall 50%    Time  8    Period  Weeks    Status  New      PT LONG TERM GOAL #3   Title  increase passive SLR to 70 degrees without pain    Time  8    Period  Weeks    Status  New      PT LONG TERM GOAL #4   Title  report able to shop without difficulty    Time  8    Period  Weeks    Status  New            Plan - 10/02/19 [id number]    Clinical Impression Statement  started initial ex for lumbar ROM, core and hip strength and  pt tolerated fair. educ on desk regonomics and set up to decrease strain on back.added estim and MH today d/t pain at arrive, some increased burning with ex    PT Treatment/Interventions  ADLs/Self Care Home Management;Electrical  Stimulation;Cryotherapy;Iontophoresis 4mg /ml Dexamethasone;Moist Heat;Therapeutic activities;Therapeutic exercise;Manual techniques;Patient/family education    PT Next Visit Plan  core stab and flexibility       Patient will benefit from skilled therapeutic intervention in order to improve the following deficits and impairments:  Pain, Improper body mechanics, Decreased mobility, Increased muscle spasms, Postural dysfunction, Decreased strength, Decreased range of motion, Impaired flexibility  Visit Diagnosis: Acute left-sided low back pain without sciatica  Pain in thoracic spine     Problem List Patient Active Problem List   Diagnosis Date Noted  . Hyperlipidemia 09/18/2019  . Spasm of thoracic back muscle 09/18/2019  . Palpitations 08/30/2018  . Obstructive sleep apnea 08/30/2018  . Pain of left calf 12/14/2017  . Other fatigue 10/11/2017  . Shortness of breath on exertion 10/11/2017  . Prediabetes 10/11/2017  . Chronic left-sided thoracic back pain 05/23/2017  . Acute left-sided thoracic back pain 05/09/2017  . Migraine 05/09/2017  . Essential hypertension 05/09/2017  . Common migraine with intractable migraine 02/02/2017  . Chronic fatigue 01/06/2016  . Fatty liver 02/15/2015  . Esophageal reflux 02/15/2015  . Late menses 07/30/2014  . Morbid obesity ([place]) 07/30/2014  . Asthma without acute exacerbation 07/30/2014  . IBS (irritable bowel syndrome) 07/30/2014  . Elevated BP 07/30/2014  . PCOS (polycystic ovarian syndrome) 11/25/2012  . Carpal tunnel syndrome, bilateral     Jontrell Bushong,ANGIE PTA 10/02/2019, 9:27 AM  [address] [place], [place], [address] Phone: [phone]   Fax:  [phone]  Name: Brandy Saunders MRN: [id number] Date of Birth: Mar 02, 1991

## 2019-10-08 ENCOUNTER — Ambulatory Visit (INDEPENDENT_AMBULATORY_CARE_PROVIDER_SITE_OTHER): Payer: BC Managed Care – PPO

## 2019-10-08 ENCOUNTER — Ambulatory Visit (INDEPENDENT_AMBULATORY_CARE_PROVIDER_SITE_OTHER): Payer: BC Managed Care – PPO | Admitting: Family Medicine

## 2019-10-08 ENCOUNTER — Other Ambulatory Visit: Payer: Self-pay

## 2019-10-08 ENCOUNTER — Other Ambulatory Visit (INDEPENDENT_AMBULATORY_CARE_PROVIDER_SITE_OTHER): Payer: Self-pay | Admitting: Family Medicine

## 2019-10-08 ENCOUNTER — Encounter: Payer: Self-pay | Admitting: Family Medicine

## 2019-10-08 VITALS — BP 110/78 | HR 83 | Ht 65.0 in | Wt >= 6400 oz

## 2019-10-08 DIAGNOSIS — M545 Low back pain, unspecified: Secondary | ICD-10-CM

## 2019-10-08 DIAGNOSIS — M25552 Pain in left hip: Secondary | ICD-10-CM

## 2019-10-08 DIAGNOSIS — M5416 Radiculopathy, lumbar region: Secondary | ICD-10-CM | POA: Diagnosis not present

## 2019-10-08 DIAGNOSIS — F3289 Other specified depressive episodes: Secondary | ICD-10-CM

## 2019-10-08 MED ORDER — GABAPENTIN 300 MG PO CAPS: ORAL_CAPSULE | ORAL | 3 refills | Status: DC

## 2019-10-08 NOTE — Patient Instructions (Addendum)
Thank you for coming in today. Get xray today.  Return in 1 month.  Continue PT.  Try the gabapentin for nerve pain.  Let me know if not doing well.

## 2019-10-08 NOTE — Progress Notes (Signed)
Brandy Saunders is a 29 y.o. female who presents to [employer] at [facility] today for follow-up low back and hip pain.  Brandy Saunders was originally seen on January 13 for pain in the thoracic spine L-spine and lateral hip thought to be myofascial dysfunction and trochanteric bursitis/hip abductor tendinopathy.  She was referred to physical therapy and she has had 2 visits.  Since then her thoracic back pain has improved however she continues to have pretty significant pain in the left low back.  She also notes some pain now occasionally radiating to the anterior hip and groin.  She notes her lateral hip pain has somewhat diminished.  Pain is worse with motion and activity.  She denies any pain radiating down her leg weakness or numbness or bowel bladder dysfunction.  She notes cyclobenzaprine has been somewhat effective at controlling muscle spasms but tends to make her somewhat sedated.   Pertinent review of systems: No fevers or chills  Relevant historical information: Morbid obesity   Exam:  BP 110/78   Pulse 83   Ht 5\' 5"  (1.651 m)   Wt (!) 436 lb (197.8 kg)   SpO2 98%   BMI 72.55 kg/m  General: Well Developed, well nourished, and in no acute distress.   MSK:  T-spine: Nontender midline.  Nontender paraspinal musculature. L-spine nontender midline.  Tender palpation left SI joint region. Decreased lumbar motion.  Lower extremity strength is intact. Left hip: Normal-appearing normal motion no pain with internal rotation and flexion. Some tenderness at lateral hip greater trochanter. Hip adduction strength is intact however difficult to fully examine due to body habitus.    Lab and Radiology Results  X-ray images obtained today personally independently reviewed.  L-spine: DDD at T12-L1 otherwise normal-appearing L-spine.  Left hip: No severe DJD.  No fracture.  Await formal radiology review  CT scan images abdomen and pelvis obtained August 2020  again personally independently reviewed special focus on L-spine and hip.  No severe changes no severe DJD no fractures.    Assessment and Plan: 29 y.o. female with  Continued left low back pain and now pain into the anterior hip and groin. Pain seems to be more focused at [facility] joint and still is likely to be myofascial dysfunction and spasm.  However pain into the hip could be intra-articular hip versus L1 or L2 lumbar radiculopathy. Plan for x-ray as above.  Discussed options.  Patient declined trial of SI joint injection today.  Continue physical therapy.  Try gabapentin and recheck in 1 month.  If not better at recheck would consider SI joint injection versus MRI.   PDMP not reviewed this encounter. Orders Placed This Encounter  Procedures  . DG Lumbar Spine Complete    Standing Status:   Future    Standing Expiration Date:   12/05/2020    Order Specific Question:   Reason for Exam (SYMPTOM  OR DIAGNOSIS REQUIRED)    Answer:   eval left low back pain. Poss L1 or L2 radic    Order Specific Question:   Is patient pregnant?    Answer:   No    Order Specific Question:   Preferred imaging location?    Answer:   Brandy Saunders    Order Specific Question:   Radiology Contrast Protocol - do NOT remove file path    Answer:   \\[email or web address]s.pdf  . DG HIP UNILAT WITH PELVIS 2-3 VIEWS LEFT    Standing Status:   Future  Standing Expiration Date:   12/05/2020    Order Specific Question:   Reason for Exam (SYMPTOM  OR DIAGNOSIS REQUIRED)    Answer:   eval left low back and hip pain    Order Specific Question:   Is patient pregnant?    Answer:   No    Order Specific Question:   Preferred imaging location?    Answer:   Brandy Saunders    Order Specific Question:   Radiology Contrast Protocol - do NOT remove file path    Answer:   \\[email or web address]\[email or web address]\DXFluoroContrastProtocols.pdf   Meds ordered this encounter  Medications  .  gabapentin (NEURONTIN) 300 MG capsule    Sig: For Nerve Pain One tab PO qHS for a week, then BID for a week, then TID. May double weekly to a max of 3,600mg /day    Dispense:  180 capsule    Refill:  3     Discussed warning signs or symptoms. Please see discharge instructions. Patient expresses understanding.   The above documentation has been reviewed and is accurate and complete Brandy Saunders

## 2019-10-09 ENCOUNTER — Ambulatory Visit: Payer: BC Managed Care – PPO | Admitting: Physical Therapy

## 2019-10-09 DIAGNOSIS — M545 Low back pain, unspecified: Secondary | ICD-10-CM

## 2019-10-09 DIAGNOSIS — M546 Pain in thoracic spine: Secondary | ICD-10-CM

## 2019-10-09 NOTE — Progress Notes (Signed)
X-ray hip looks normal

## 2019-10-09 NOTE — Progress Notes (Signed)
X-ray L-spine looks normal to radiology.

## 2019-10-09 NOTE — Therapy (Signed)
[address] [place], [place], [address] Phone: [phone]   Fax:  [phone]  Physical Therapy Treatment  Patient Details  Name: Brandy Saunders MRN: [id number] Date of Birth: Mar 17, 1991 Referring Provider (PT): Marikay Alar Date: 10/09/2019  PT End of Session - 10/09/19 0923    Visit Number  3    Number of Visits  30    Date for PT Re-Evaluation  11/22/19    PT Start Time  0845    PT Stop Time  0925    PT Time Calculation (min)  40 min       Past Medical History:  Diagnosis Date  . Alcohol abuse   . Anxiety   . Asthma   . Back pain   . Binge eating   . Carpal tunnel syndrome, bilateral   . Chest pain   . Common migraine with intractable migraine 02/02/2017  . Constipation   . Depression   . Dyspnea   . Fatty liver   . GERD (gastroesophageal reflux disease)   . Hypertension   . Joint pain   . Leg edema   . Morbidly obese ([place])   . Palpitations   . PCOS (polycystic ovarian syndrome)   . Prediabetes   . Sleep apnea   . Vaginal Pap smear, abnormal   . Vitamin D deficiency     Past Surgical History:  Procedure Laterality Date  . TONSILECTOMY, ADENOIDECTOMY, BILATERAL MYRINGOTOMY AND TUBES  1994    There were no vitals filed for this visit.  Subjective Assessment - 10/09/19 0845    Subjective  i was miserable after last session, so much pain. pain now isolated in Left LB, " burning, hot" feel like needs to pop    Currently in Pain?  Yes    Pain Score  7     Pain Location  Back    Pain Orientation  Left    Pain Descriptors / Indicators  Burning                       [facility] Adult PT Treatment/Exercise - 10/09/19 0001      Modalities   Modalities  Traction      Traction   Type of Traction  Lumbar    Max (lbs)  125    Time  15      Manual Therapy   Manual Therapy  Passive ROM;Manual Traction    Manual therapy comments  PROM to LE and trunk - mild  tightness and pt c/o mostly feeling it on left side of back    Manual Traction  relief with sheet traction               PT Short Term Goals - 09/24/19 1349      PT SHORT TERM GOAL #1   Title  indepednent with initial HEP    Time  2    Period  Weeks    Status  New        PT Long Term Goals - 09/24/19 1350      PT LONG TERM GOAL #1   Title  understand posture and body mechanics    Time  8    Period  Weeks    Status  New      PT LONG TERM GOAL #2   Title  decrease pain overall 50%    Time  8    Period  Weeks    Status  New      PT LONG TERM GOAL #3   Title  increase passive SLR to 70 degrees without pain    Time  8    Period  Weeks    Status  New      PT LONG TERM GOAL #4   Title  report able to shop without difficulty    Time  8    Period  Weeks    Status  New            Plan - 10/09/19 [id number]    Clinical Impression Statement  pt arrived stating increased pain after last session, after disucssion with pt and MT it seemed more nerve pain so tried mech tarction and she verb it did feel some better after    PT Treatment/Interventions  ADLs/Self Care Home Management;Electrical Stimulation;Cryotherapy;Iontophoresis 4mg /ml Dexamethasone;Moist Heat;Therapeutic activities;Therapeutic exercise;Manual techniques;Patient/family education    PT Next Visit Plan  assess traction and pain       Patient will benefit from skilled therapeutic intervention in order to improve the following deficits and impairments:  Pain, Improper body mechanics, Decreased mobility, Increased muscle spasms, Postural dysfunction, Decreased strength, Decreased range of motion, Impaired flexibility  Visit Diagnosis: Pain in thoracic spine  Acute left-sided low back pain without sciatica     Problem List Patient Active Problem List   Diagnosis Date Noted  . Hyperlipidemia 09/18/2019  . Spasm of thoracic back muscle 09/18/2019  . Palpitations 08/30/2018  . Obstructive sleep apnea  08/30/2018  . Pain of left calf 12/14/2017  . Other fatigue 10/11/2017  . Shortness of breath on exertion 10/11/2017  . Prediabetes 10/11/2017  . Chronic left-sided thoracic back pain 05/23/2017  . Acute left-sided thoracic back pain 05/09/2017  . Migraine 05/09/2017  . Essential hypertension 05/09/2017  . Common migraine with intractable migraine 02/02/2017  . Chronic fatigue 01/06/2016  . Fatty liver 02/15/2015  . Esophageal reflux 02/15/2015  . Late menses 07/30/2014  . Morbid obesity ([place]) 07/30/2014  . Asthma without acute exacerbation 07/30/2014  . IBS (irritable bowel syndrome) 07/30/2014  . Elevated BP 07/30/2014  . PCOS (polycystic ovarian syndrome) 11/25/2012  . Carpal tunnel syndrome, bilateral     Angelica Wix,ANGIE PTA 10/09/2019, 9:26 AM  [address] [place], [place], [address] Phone: [phone]   Fax:  [phone]  Name: ONIESHA LASKO MRN: [id number] Date of Birth: [birth year]

## 2019-10-15 ENCOUNTER — Ambulatory Visit (INDEPENDENT_AMBULATORY_CARE_PROVIDER_SITE_OTHER): Payer: BC Managed Care – PPO | Admitting: Family Medicine

## 2019-10-15 ENCOUNTER — Other Ambulatory Visit: Payer: Self-pay

## 2019-10-15 ENCOUNTER — Encounter (INDEPENDENT_AMBULATORY_CARE_PROVIDER_SITE_OTHER): Payer: Self-pay | Admitting: Family Medicine

## 2019-10-15 VITALS — BP 103/64 | HR 71 | Temp 97.7°F | Ht 65.0 in | Wt >= 6400 oz

## 2019-10-15 DIAGNOSIS — Z9189 Other specified personal risk factors, not elsewhere classified: Secondary | ICD-10-CM

## 2019-10-15 DIAGNOSIS — R7303 Prediabetes: Secondary | ICD-10-CM | POA: Diagnosis not present

## 2019-10-15 DIAGNOSIS — F3289 Other specified depressive episodes: Secondary | ICD-10-CM

## 2019-10-15 DIAGNOSIS — Z6841 Body Mass Index (BMI) 40.0 and over, adult: Secondary | ICD-10-CM

## 2019-10-15 MED ORDER — BUPROPION HCL ER (SR) 200 MG PO TB12: 200.0000 mg | ORAL_TABLET | Freq: Every day | ORAL | 0 refills | Status: DC

## 2019-10-15 NOTE — Progress Notes (Signed)
Chief Complaint:   OBESITY Brandy Saunders is here to discuss her progress with her obesity treatment plan along with follow-up of her obesity related diagnoses. Brandy Saunders is on keeping a food journal and adhering to recommended goals of 2000 calories and 100 grams of protein and states she is following her eating plan approximately 60% of the time. Brandy Saunders states she is exercising 0 minutes 0 times per week.  Today's visit was #: 23 Starting weight: 447 lbs Starting date: 10/11/2017 Today's weight: 434 lbs Today's date: 10/15/2019 Total lbs lost to date: 13 Total lbs lost since last in-office visit: 0  Interim History: Brandy Saunders is not always journaling consistently. She is doing better with meeting her protein goals. She struggles especially at dinner to get in the prescribed amount of protein.  Subjective:   Other depression,with emotional eating. Brandy Saunders is struggling with emotional eating and using food for comfort to the extent that it is negatively impacting her health. She has been working on behavior modification techniques to help reduce her emotional eating and has been somewhat successful. She shows no sign of suicidal or homicidal ideations. Brandy Saunders reports some emotional eating due to chaos at work. This does not happen daily. She is interested in increasing her dose of bupropion.  Prediabetes. Brandy Saunders has a diagnosis of prediabetes based on her elevated HgA1c and was informed this puts her at greater risk of developing diabetes. She continues to work on diet and exercise to decrease her risk of diabetes. She denies nausea or hypoglycemia. Brandy Saunders is on metformin and reports polyphagia. She feels she is not getting full with meals.  Lab Results  Component Value Date   HGBA1C 5.8 (H) 06/17/2019   Lab Results  Component Value Date   INSULIN 35.9 (H) 06/17/2019   INSULIN 30.3 (H) 05/02/2018   INSULIN 39.5 (H) 01/17/2018   INSULIN 73.6 (H) 10/11/2017   At risk  for diabetes mellitus. Brandy Saunders is at higher than average risk for developing diabetes due to her obesity.   Assessment/Plan:   Other depression,with emotional eating. Behavior modification techniques were discussed today to help Brandy Saunders deal with her emotional/non-hunger eating behaviors.  Orders and follow up as documented in patient record. Brandy Saunders will increase her dose of bupropion and she was given a new prescription for buPROPion (WELLBUTRIN SR) 200 MG 12 hr tablet QD #30 with 0 refills.  Prediabetes. Brandy Saunders will continue to work on weight loss, exercise, and decreasing simple carbohydrates to help decrease the risk of diabetes. She was instructed to increase her protein and decrease simple carbs. We discussed Victoza/Saxenda and she will consider this.  At risk for diabetes mellitus. Brandy Saunders was given approximately 15 minutes of diabetes education and counseling today. We discussed intensive lifestyle modifications today with an emphasis on weight loss as well as increasing exercise and decreasing simple carbohydrates in her diet. We also reviewed medication options such as Saxenda with an emphasis on risk versus benefit of those discussed.   Repetitive spaced learning was employed today to elicit superior memory formation and behavioral change.  Class 3 severe obesity with serious comorbidity and body mass index (BMI) greater than or equal to 70 in adult, unspecified obesity type (Brandy Saunders).  Brandy Saunders is currently in the action stage of change. As such, her goal is to continue with weight loss efforts. She has agreed to keeping a food journal and adhering to recommended goals of 1600-2000 calories and 100 grams of protein.   We discussed having her largest  meal before work as she works the evening shift.  Exercise goals: No exercise has been prescribed at this time.  Behavioral modification strategies: increasing lean protein intake, decreasing simple carbohydrates and keeping a strict  food journal.  Brandy Saunders has agreed to follow-up with our clinic in 3 weeks. She was informed of the importance of frequent follow-up visits to maximize her success with intensive lifestyle modifications for her multiple health conditions.   Objective:   Blood pressure 103/64, pulse 71, temperature 97.7 F (36.5 C), temperature source Oral, height 5\' 5"  (1.651 m), weight (!) 434 lb (196.9 kg), SpO2 99 %. Body mass index is 72.22 kg/m.  General: Cooperative, alert, well developed, in no acute distress. HEENT: Conjunctivae and lids unremarkable. Cardiovascular: Regular rhythm.  Lungs: Normal work of breathing. Neurologic: No focal deficits.   Lab Results  Component Value Date   CREATININE 0.81 09/03/2019   BUN 14 09/03/2019   NA 138 09/03/2019   K 3.8 09/03/2019   CL 103 09/03/2019   CO2 24 09/03/2019   Lab Results  Component Value Date   ALT 35 (H) 06/17/2019   AST 26 06/17/2019   ALKPHOS 64 06/17/2019   BILITOT 0.5 06/17/2019   Lab Results  Component Value Date   HGBA1C 5.8 (H) 06/17/2019   HGBA1C 5.8 (H) 05/02/2018   HGBA1C 5.7 (H) 01/17/2018   HGBA1C 6.0 (H) 10/11/2017   HGBA1C 6.1 09/13/2017   Lab Results  Component Value Date   INSULIN 35.9 (H) 06/17/2019   INSULIN 30.3 (H) 05/02/2018   INSULIN 39.5 (H) 01/17/2018   INSULIN 73.6 (H) 10/11/2017   Lab Results  Component Value Date   TSH 2.050 06/17/2019   Lab Results  Component Value Date   CHOL 153 06/17/2019   HDL 52 06/17/2019   LDLCALC 79 06/17/2019   TRIG 127 06/17/2019   CHOLHDL 3 03/10/2019   Lab Results  Component Value Date   WBC 6.3 09/03/2019   HGB 13.4 09/03/2019   HCT 41.2 09/03/2019   MCV 85.7 09/03/2019   PLT 248 09/03/2019   No results found for: IRON, TIBC, FERRITIN  Attestation Statements:   Reviewed by clinician on day of visit: allergies, medications, problem list, medical history, surgical history, family history, social history, and previous encounter notes.  Brandy Saunders, am acting as Location manager for Brandy Schwab, FNP   I have reviewed the above documentation for accuracy and completeness, and I agree with the above. -  Brandy Fick, FNP

## 2019-10-16 ENCOUNTER — Ambulatory Visit: Payer: BC Managed Care – PPO | Admitting: Physical Therapy

## 2019-10-19 DIAGNOSIS — F329 Major depressive disorder, single episode, unspecified: Secondary | ICD-10-CM | POA: Insufficient documentation

## 2019-10-19 DIAGNOSIS — F32A Depression, unspecified: Secondary | ICD-10-CM | POA: Insufficient documentation

## 2019-10-28 ENCOUNTER — Ambulatory Visit: Payer: BC Managed Care – PPO | Admitting: Family Medicine

## 2019-10-30 ENCOUNTER — Other Ambulatory Visit: Payer: Self-pay

## 2019-10-30 ENCOUNTER — Ambulatory Visit: Payer: BC Managed Care – PPO | Attending: Family Medicine | Admitting: Physical Therapy

## 2019-10-30 DIAGNOSIS — M545 Low back pain, unspecified: Secondary | ICD-10-CM

## 2019-10-30 DIAGNOSIS — M546 Pain in thoracic spine: Secondary | ICD-10-CM | POA: Diagnosis not present

## 2019-10-30 NOTE — Therapy (Signed)
[address] [place], [place], [address] Phone: [phone]   Fax:  [phone]  Physical Therapy Treatment  Patient Details  Name: Brandy Saunders MRN: [id number] Date of Birth: 1990-12-19 Referring Provider (PT): Marikay Alar Date: 10/30/2019  PT End of Session - 10/30/19 1044    Visit Number  4    Number of Visits  30    Date for PT Re-Evaluation  11/22/19    PT Start Time  7124    PT Stop Time  1045    PT Time Calculation (min)  30 min       Past Medical History:  Diagnosis Date  . Alcohol abuse   . Anxiety   . Asthma   . Back pain   . Binge eating   . Carpal tunnel syndrome, bilateral   . Chest pain   . Common migraine with intractable migraine 02/02/2017  . Constipation   . Depression   . Dyspnea   . Fatty liver   . GERD (gastroesophageal reflux disease)   . Hypertension   . Joint pain   . Leg edema   . Morbidly obese ([place])   . Palpitations   . PCOS (polycystic ovarian syndrome)   . Prediabetes   . Sleep apnea   . Vaginal Pap smear, abnormal   . Vitamin D deficiency     Past Surgical History:  Procedure Laterality Date  . TONSILECTOMY, ADENOIDECTOMY, BILATERAL MYRINGOTOMY AND TUBES  1994    There were no vitals filed for this visit.  Subjective Assessment - 10/30/19 1017    Subjective  moving better, overall better- " I guess it just calmed down" I think traction was good- may need to revisit that    Currently in Pain?  Yes    Pain Score  3     Pain Location  Back                       [facility] Adult PT Treatment/Exercise - 10/30/19 0001      Lumbar Exercises: Stretches   Active Hamstring Stretch  Right;Left;3 reps;20 seconds   seated   Other Lumbar Stretch Exercise  seated trunk flexion stretch    Other Lumbar Stretch Exercise  standing trunk ext stretch      Lumbar Exercises: Aerobic   Nustep  L 5 6 min      Lumbar Exercises: Standing   Other  Standing Lumbar Exercises  red tband scap stab 10 reps 3 way   isometric abdominal with ball squeeze 10 x 3 sec hold   Other Standing Lumbar Exercises  red tband hip ext and abd 10 each      Lumbar Exercises: Seated   Long Arc Quad on Chair  Strengthening;Both;15 reps;Weights    LAQ on Chair Weights (lbs)  3    Sit to Stand  10 reps   with wt ball chest press   Other Seated Lumbar Exercises  HS curl 15 x red tband             PT Education - 10/30/19 1043    Education Details  trunk and HS stretching    Person(s) Educated  Patient    Methods  Explanation;Demonstration    Comprehension  Verbalized understanding;Returned demonstration       PT Short Term Goals - 10/30/19 1044      PT SHORT TERM GOAL #1   Title  indepednent with  initial HEP    Status  Achieved        PT Long Term Goals - 10/30/19 1044      PT LONG TERM GOAL #1   Title  understand posture and body mechanics    Status  Partially Met      PT LONG TERM GOAL #2   Title  decrease pain overall 50%    Status  On-going      PT LONG TERM GOAL #3   Title  increase passive SLR to 70 degrees without pain    Status  On-going      PT LONG TERM GOAL #4   Title  report able to shop without difficulty    Status  Partially Met            Plan - 10/30/19 1055    Clinical Impression Statement  pt arrived felling better and stated not sure what made it worse or better " so lets just try ex and maybe revisit traction at a later date" limited ex to try not to aggravate adn issued home stretching and she did well. progressing with goals    PT Treatment/Interventions  ADLs/Self Care Home Management;Electrical Stimulation;Cryotherapy;Iontophoresis 32m/ml Dexamethasone;Moist Heat;Therapeutic activities;Therapeutic exercise;Manual techniques;Patient/family education    PT Next Visit Plan  assess and progress       Patient will benefit from skilled therapeutic intervention in order to improve the following deficits  and impairments:  Pain, Improper body mechanics, Decreased mobility, Increased muscle spasms, Postural dysfunction, Decreased strength, Decreased range of motion, Impaired flexibility  Visit Diagnosis: Pain in thoracic spine  Acute left-sided low back pain without sciatica     Problem List Patient Active Problem List   Diagnosis Date Noted  . Depression 10/19/2019  . Hyperlipidemia 09/18/2019  . Spasm of thoracic back muscle 09/18/2019  . Palpitations 08/30/2018  . Obstructive sleep apnea 08/30/2018  . Pain of left calf 12/14/2017  . Other fatigue 10/11/2017  . Shortness of breath on exertion 10/11/2017  . Prediabetes 10/11/2017  . Chronic left-sided thoracic back pain 05/23/2017  . Acute left-sided thoracic back pain 05/09/2017  . Migraine 05/09/2017  . Essential hypertension 05/09/2017  . Common migraine with intractable migraine 02/02/2017  . Chronic fatigue 01/06/2016  . Fatty liver 02/15/2015  . Esophageal reflux 02/15/2015  . Late menses 07/30/2014  . Class 3 severe obesity with serious comorbidity and body mass index (BMI) greater than or equal to 70 in adult ([facility] 07/30/2014  . Asthma without acute exacerbation 07/30/2014  . IBS (irritable bowel syndrome) 07/30/2014  . Elevated BP 07/30/2014  . PCOS (polycystic ovarian syndrome) 11/25/2012  . Carpal tunnel syndrome, bilateral     Brandy Saunders,Brandy Saunders PTA 10/30/2019, 10:57 AM  [address] [address]Phone: [phone]  Fax:  [phone] Name: Brandy COLLANTESMRN: [id number] of Birth: [birth year]

## 2019-11-04 DIAGNOSIS — F419 Anxiety disorder, unspecified: Secondary | ICD-10-CM | POA: Diagnosis not present

## 2019-11-05 ENCOUNTER — Encounter (INDEPENDENT_AMBULATORY_CARE_PROVIDER_SITE_OTHER): Payer: Self-pay | Admitting: Family Medicine

## 2019-11-05 ENCOUNTER — Ambulatory Visit (INDEPENDENT_AMBULATORY_CARE_PROVIDER_SITE_OTHER): Payer: BC Managed Care – PPO | Admitting: Family Medicine

## 2019-11-05 ENCOUNTER — Other Ambulatory Visit: Payer: Self-pay

## 2019-11-05 VITALS — BP 107/73 | HR 75 | Temp 98.6°F | Ht 65.0 in | Wt >= 6400 oz

## 2019-11-05 DIAGNOSIS — Z9189 Other specified personal risk factors, not elsewhere classified: Secondary | ICD-10-CM

## 2019-11-05 DIAGNOSIS — I10 Essential (primary) hypertension: Secondary | ICD-10-CM

## 2019-11-05 DIAGNOSIS — Z6841 Body Mass Index (BMI) 40.0 and over, adult: Secondary | ICD-10-CM

## 2019-11-05 DIAGNOSIS — R7303 Prediabetes: Secondary | ICD-10-CM | POA: Diagnosis not present

## 2019-11-05 DIAGNOSIS — E559 Vitamin D deficiency, unspecified: Secondary | ICD-10-CM | POA: Diagnosis not present

## 2019-11-05 MED ORDER — VITAMIN D (ERGOCALCIFEROL) 1.25 MG (50000 UNIT) PO CAPS: 50000.0000 [IU] | ORAL_CAPSULE | ORAL | 0 refills | Status: DC

## 2019-11-05 MED ORDER — METOPROLOL SUCCINATE ER 100 MG PO TB24: 100.0000 mg | ORAL_TABLET | Freq: Every day | ORAL | 0 refills | Status: DC

## 2019-11-05 MED ORDER — METFORMIN HCL 500 MG PO TABS: 500.0000 mg | ORAL_TABLET | Freq: Every day | ORAL | 0 refills | Status: DC

## 2019-11-05 NOTE — Progress Notes (Signed)
Chief Complaint:   OBESITY Brandy Saunders is here to discuss her progress with her obesity treatment plan along with follow-up of her obesity related diagnoses. Brandy Saunders is on keeping a food journal and adhering to recommended goals of 1600-2000 calories and 100 grams of protein daily and states she is following her eating plan approximately 50% of the time. Brandy Saunders states she is walking for 15 minutes 7 times per week.  Today's visit was #: 24 Starting weight: 447 lbs Starting date: 10/11/2017 Today's weight: 432 lbs Today's date: 11/05/2019 Total lbs lost to date: 15 Total lbs lost since last in-office visit: 2  Interim History: Brandy Saunders has been doing well with journaling, but she feels being so structured is increasing her anxiety. She discussed this with her therapist, and she would like to look at ways to help this.  Subjective:   1. Pre-diabetes Brandy Saunders is stable on metformin. She might have some polyphagia, but this also may be emotional hunger.  2. Essential hypertension Brandy Saunders's blood pressure is very well controlled. She denies dizziness or lightheadedness.  3. Vitamin D deficiency Brandy Saunders is stable on Vit D, but level is not yet at goal.  4. At risk for complication associated with hypotension  The patient is at a higher than average risk of hypotension due to weight loss and low blood pressure.  Assessment/Plan:   1. Pre-diabetes Brandy Saunders will continue to work on weight loss, diet, exercise, and decreasing simple carbohydrates to help decrease the risk of diabetes. We will refill metformin for 1 month.  - metFORMIN (GLUCOPHAGE) 500 MG tablet; Take 1 tablet (500 mg total) by mouth daily with breakfast.  Dispense: 90 tablet; Refill: 0  2. Essential hypertension Brandy Saunders is working on healthy weight loss and exercise to improve blood pressure control. We will watch for signs of hypotension as she continues her lifestyle modifications. We will refill metoprolol for  1 month, and follow closely. We may need to decrease dose if her symptoms of low blood pressure is present.  - metoprolol succinate (TOPROL-XL) 100 MG 24 hr tablet; Take 1 tablet (100 mg total) by mouth daily. Take with or immediately following a meal.  Dispense: 30 tablet; Refill: 0  3. Vitamin D deficiency Low Vitamin D level contributes to fatigue and are associated with obesity, breast, and colon cancer. We will refill prescription Vitamin D for 1 month. Brandy Saunders will follow-up for routine testing of Vitamin D, at least 2-3 times per year to avoid over-replacement.  - Vitamin D, Ergocalciferol, (DRISDOL) 1.25 MG (50000 UNIT) CAPS capsule; Take 1 capsule (50,000 Units total) by mouth every 7 (seven) days.  Dispense: 4 capsule; Refill: 0  4. At risk for complication associated with hypotension Brandy Saunders was given approximately 15 minutes of education and counseling today to help avoid hypotension. We discussed risks of hypotension with weight loss and signs of hypotension such as feeling lightheaded or unsteady.  Repetitive spaced learning was employed today to elicit superior memory formation and behavioral change.  5. Class 3 severe obesity with serious comorbidity and body mass index (BMI) greater than or equal to 70 in adult, unspecified obesity type ([facility]) Brandy Saunders is currently in the action stage of change. As such, her goal is to continue with weight loss efforts. She has agreed to keeping a food journal and adhering to recommended goals of 1600-2000 calories and 100+ grams of protein daily.  Brandy Saunders was encouraged to continue journaling, but to try to see it as just math and remove judgement  and emotions. She will decrease her visit frequency as well.   Exercise goals: As is.  Behavioral modification strategies: emotional eating strategies.  Brandy Saunders has agreed to follow-up with our clinic in 4 weeks. She was informed of the importance of frequent follow-up visits to maximize her  success with intensive lifestyle modifications for her multiple health conditions.   Objective:   Blood pressure 107/73, pulse 75, temperature 98.6 F (37 C), temperature source Oral, height 5\' 5"  (1.651 m), weight (!) 432 lb (196 kg), last menstrual period 10/23/2019, SpO2 100 %. Body mass index is 71.89 kg/m.  General: Cooperative, alert, well developed, in no acute distress. HEENT: Conjunctivae and lids unremarkable. Cardiovascular: Regular rhythm.  Lungs: Normal work of breathing. Neurologic: No focal deficits.   Lab Results  Component Value Date   CREATININE 0.81 09/03/2019   BUN 14 09/03/2019   NA 138 09/03/2019   K 3.8 09/03/2019   CL 103 09/03/2019   CO2 24 09/03/2019   Lab Results  Component Value Date   ALT 35 (H) 06/17/2019   AST 26 06/17/2019   ALKPHOS 64 06/17/2019   BILITOT 0.5 06/17/2019   Lab Results  Component Value Date   HGBA1C 5.8 (H) 06/17/2019   HGBA1C 5.8 (H) 05/02/2018   HGBA1C 5.7 (H) 01/17/2018   HGBA1C 6.0 (H) 10/11/2017   HGBA1C 6.1 09/13/2017   Lab Results  Component Value Date   INSULIN 35.9 (H) 06/17/2019   INSULIN 30.3 (H) 05/02/2018   INSULIN 39.5 (H) 01/17/2018   INSULIN 73.6 (H) 10/11/2017   Lab Results  Component Value Date   TSH 2.050 06/17/2019   Lab Results  Component Value Date   CHOL 153 06/17/2019   HDL 52 06/17/2019   LDLCALC 79 06/17/2019   TRIG 127 06/17/2019   CHOLHDL 3 03/10/2019   Lab Results  Component Value Date   WBC 6.3 09/03/2019   HGB 13.4 09/03/2019   HCT 41.2 09/03/2019   MCV 85.7 09/03/2019   PLT 248 09/03/2019   No results found for: IRON, TIBC, FERRITIN  Attestation Statements:   Reviewed by clinician on day of visit: allergies, medications, problem list, medical history, surgical history, family history, social history, and previous encounter notes.   I, Trixie Dredge, am acting as transcriptionist for Dennard Nip, MD.  I have reviewed the above documentation for accuracy and  completeness, and I agree with the above. -  Dennard Nip, MD

## 2019-11-06 ENCOUNTER — Ambulatory Visit: Payer: BC Managed Care – PPO | Admitting: Physical Therapy

## 2019-11-06 ENCOUNTER — Ambulatory Visit: Payer: BC Managed Care – PPO | Admitting: Family Medicine

## 2019-11-06 DIAGNOSIS — F419 Anxiety disorder, unspecified: Secondary | ICD-10-CM | POA: Diagnosis not present

## 2019-11-06 DIAGNOSIS — M545 Low back pain, unspecified: Secondary | ICD-10-CM

## 2019-11-06 DIAGNOSIS — M546 Pain in thoracic spine: Secondary | ICD-10-CM

## 2019-11-06 NOTE — Therapy (Signed)
[address] [place], [place], [address] Phone: [phone]   Fax:  [phone]  Physical Therapy Treatment  Patient Details  Name: Brandy Saunders MRN: [id number] Date of Birth: March 23, 1991 Referring Provider (PT): Georgina Snell   Encounter Date: 11/06/2019  PT End of Session - 11/06/19 1046    Visit Number  5    Number of Visits  30    Date for PT Re-Evaluation  11/22/19    PT Start Time  1005    PT Stop Time  7948    PT Time Calculation (min)  40 min       Past Medical History:  Diagnosis Date  . Alcohol abuse   . Anxiety   . Asthma   . Back pain   . Binge eating   . Carpal tunnel syndrome, bilateral   . Chest pain   . Common migraine with intractable migraine 02/02/2017  . Constipation   . Depression   . Dyspnea   . Fatty liver   . GERD (gastroesophageal reflux disease)   . Hypertension   . Joint pain   . Leg edema   . Morbidly obese ([place])   . Palpitations   . PCOS (polycystic ovarian syndrome)   . Prediabetes   . Sleep apnea   . Vaginal Pap smear, abnormal   . Vitamin D deficiency     Past Surgical History:  Procedure Laterality Date  . TONSILECTOMY, ADENOIDECTOMY, BILATERAL MYRINGOTOMY AND TUBES  1994    There were no vitals filed for this visit.  Subjective Assessment - 11/06/19 1011    Subjective  doing better overall, still popping alot. doing stretches at home. able to walk the dog my much better now    Currently in Pain?  Yes    Pain Score  3     Pain Location  Back    Pain Orientation  Left                       [facility] Adult PT Treatment/Exercise - 11/06/19 0001      Lumbar Exercises: Aerobic   Nustep  L 5 6 min      Lumbar Exercises: Machines for Strengthening   Cybex Lumbar Extension  10 times black tband   trunk flex 10x black tband   Cybex Knee Extension  10# 2 sets 10    Cybex Knee Flexion  25# 2 sets 10    Other Lumbar Machine Exercise  lat pull  and row 20# 15x             PT Education - 11/06/19 1021    Education Details  scap stab standing, seated LE with tband ( hip flex,abd and LAQ)and standing hip 3 way- all with red tband    Person(s) Educated  Patient    Methods  Explanation;Demonstration;Handout    Comprehension  Verbalized understanding;Returned demonstration       PT Short Term Goals - 10/30/19 1044      PT SHORT TERM GOAL #1   Title  indepednent with initial HEP    Status  Achieved        PT Long Term Goals - 11/06/19 1033      PT LONG TERM GOAL #1   Title  understand posture and body mechanics    Status  Partially Met      PT LONG TERM GOAL #2   Title  decrease pain overall 50%  Status  Partially Met      PT LONG TERM GOAL #3   Title  increase passive SLR to 70 degrees without pain    Status  Partially Met      PT LONG TERM GOAL #4   Title  report able to shop without difficulty    Status  Partially Met            Plan - 11/06/19 1049    Clinical Impression Statement  pt feeling better and more active d/t less pain. pt tolerated increase in gym activity and we added to HEP. Progressing with goals.    PT Treatment/Interventions  ADLs/Self Care Home Management;Electrical Stimulation;Cryotherapy;Iontophoresis 27m/ml Dexamethasone;Moist Heat;Therapeutic activities;Therapeutic exercise;Manual techniques;Patient/family education    PT Next Visit Plan  assess and progress       Patient will benefit from skilled therapeutic intervention in order to improve the following deficits and impairments:  Pain, Improper body mechanics, Decreased mobility, Increased muscle spasms, Postural dysfunction, Decreased strength, Decreased range of motion, Impaired flexibility  Visit Diagnosis: Acute left-sided low back pain without sciatica  Pain in thoracic spine     Problem List Patient Active Problem List   Diagnosis Date Noted  . Depression 10/19/2019  . Hyperlipidemia 09/18/2019  . Spasm of  thoracic back muscle 09/18/2019  . Palpitations 08/30/2018  . Obstructive sleep apnea 08/30/2018  . Pain of left calf 12/14/2017  . Other fatigue 10/11/2017  . Shortness of breath on exertion 10/11/2017  . Prediabetes 10/11/2017  . Chronic left-sided thoracic back pain 05/23/2017  . Acute left-sided thoracic back pain 05/09/2017  . Migraine 05/09/2017  . Essential hypertension 05/09/2017  . Common migraine with intractable migraine 02/02/2017  . Chronic fatigue 01/06/2016  . Fatty liver 02/15/2015  . Esophageal reflux 02/15/2015  . Late menses 07/30/2014  . Class 3 severe obesity with serious comorbidity and body mass index (BMI) greater than or equal to 70 in adult ([facility] 07/30/2014  . Asthma without acute exacerbation 07/30/2014  . IBS (irritable bowel syndrome) 07/30/2014  . Elevated BP 07/30/2014  . PCOS (polycystic ovarian syndrome) 11/25/2012  . Carpal tunnel syndrome, bilateral     Raziel Koenigs,ANGIE PTA 11/06/2019, 10:50 AM  [address] [place] [address]Phone: [phone]  Fax:  [phone] Name: Brandy STRAUCHMRN: [id number] of Birth: 810-14-1992

## 2019-11-08 DIAGNOSIS — S91302A Unspecified open wound, left foot, initial encounter: Secondary | ICD-10-CM | POA: Diagnosis not present

## 2019-11-08 DIAGNOSIS — Z23 Encounter for immunization: Secondary | ICD-10-CM | POA: Diagnosis not present

## 2019-11-11 ENCOUNTER — Telehealth: Payer: Self-pay

## 2019-11-11 NOTE — Telephone Encounter (Signed)
Per pt, please update her vaccinations to include Tetanus she received on Saturday, November 08, 2019.

## 2019-11-11 NOTE — Telephone Encounter (Signed)
Documented

## 2019-11-13 ENCOUNTER — Other Ambulatory Visit: Payer: Self-pay

## 2019-11-13 ENCOUNTER — Ambulatory Visit: Payer: BC Managed Care – PPO | Admitting: Physical Therapy

## 2019-11-13 DIAGNOSIS — M546 Pain in thoracic spine: Secondary | ICD-10-CM

## 2019-11-13 DIAGNOSIS — M545 Low back pain, unspecified: Secondary | ICD-10-CM

## 2019-11-13 NOTE — Therapy (Signed)
[address] [place], [place], [address] Phone: [phone]   Fax:  [phone]  Physical Therapy Treatment  Patient Details  Name: Brandy Saunders MRN: [id number] Date of Birth: May 03, 1991 Referring Provider (PT): Georgina Snell   Encounter Date: 11/13/2019  PT End of Session - 11/13/19 8850    Visit Number  6    Number of Visits  30    Date for PT Re-Evaluation  11/22/19    PT Start Time  1700    PT Stop Time  1740    PT Time Calculation (min)  40 min       Past Medical History:  Diagnosis Date  . Alcohol abuse   . Anxiety   . Asthma   . Back pain   . Binge eating   . Carpal tunnel syndrome, bilateral   . Chest pain   . Common migraine with intractable migraine 02/02/2017  . Constipation   . Depression   . Dyspnea   . Fatty liver   . GERD (gastroesophageal reflux disease)   . Hypertension   . Joint pain   . Leg edema   . Morbidly obese ([place])   . Palpitations   . PCOS (polycystic ovarian syndrome)   . Prediabetes   . Sleep apnea   . Vaginal Pap smear, abnormal   . Vitamin D deficiency     Past Surgical History:  Procedure Laterality Date  . TONSILECTOMY, ADENOIDECTOMY, BILATERAL MYRINGOTOMY AND TUBES  1994    There were no vitals filed for this visit.  Subjective Assessment - 11/13/19 1707    Subjective  overall doing much better, still pain and tightness. starting personal training next week    Currently in Pain?  Yes    Pain Score  2     Pain Location  Back                       [facility] Adult PT Treatment/Exercise - 11/13/19 0001      Self-Care   Self-Care  ADL's;Lifting    ADL's  vacuum,laundry and foot propr    Lifting  bend knees, golfer ift      Lumbar Exercises: Aerobic   UBE (Upper Arm Bike)  2 fwd/2back L 2    Nustep  L 5 6 min      Lumbar Exercises: Machines for Strengthening   Cybex Lumbar Extension  15x black band    Cybex Knee Extension  10# 2 sets  15    Cybex Knee Flexion  25# 2 sets 15    Other Lumbar Machine Exercise  lat pull and row 25# 15x 2 sets      Lumbar Exercises: Seated   Sit to Stand  10 reps   wt ball chest press              PT Short Term Goals - 10/30/19 1044      PT SHORT TERM GOAL #1   Title  indepednent with initial HEP    Status  Achieved        PT Long Term Goals - 11/13/19 1740      PT LONG TERM GOAL #1   Title  understand posture and body mechanics    Status  Achieved      PT LONG TERM GOAL #2   Title  decrease pain overall 50%    Status  Achieved      PT LONG TERM  GOAL #3   Title  increase passive SLR to 70 degrees without pain    Status  Partially Met      PT LONG TERM GOAL #4   Title  report able to shop without difficulty    Status  Partially Met            Plan - 11/13/19 1741    Clinical Impression Statement  pt doing much better. tolerated increased ex today with fatigue esp in quads but no increased back pain. educ on BM, lifting and ADLs. progressing with goals    PT Treatment/Interventions  ADLs/Self Care Home Management;Electrical Stimulation;Cryotherapy;Iontophoresis 32m/ml Dexamethasone;Moist Heat;Therapeutic activities;Therapeutic exercise;Manual techniques;Patient/family education    PT Next Visit Plan  HOLD 2 weeks to start some personal training       Patient will benefit from skilled therapeutic intervention in order to improve the following deficits and impairments:  Pain, Improper body mechanics, Decreased mobility, Increased muscle spasms, Postural dysfunction, Decreased strength, Decreased range of motion, Impaired flexibility  Visit Diagnosis: Pain in thoracic spine  Acute left-sided low back pain without sciatica     Problem List Patient Active Problem List   Diagnosis Date Noted  . Depression 10/19/2019  . Hyperlipidemia 09/18/2019  . Spasm of thoracic back muscle 09/18/2019  . Palpitations 08/30/2018  . Obstructive sleep apnea 08/30/2018   . Pain of left calf 12/14/2017  . Other fatigue 10/11/2017  . Shortness of breath on exertion 10/11/2017  . Prediabetes 10/11/2017  . Chronic left-sided thoracic back pain 05/23/2017  . Acute left-sided thoracic back pain 05/09/2017  . Migraine 05/09/2017  . Essential hypertension 05/09/2017  . Common migraine with intractable migraine 02/02/2017  . Chronic fatigue 01/06/2016  . Fatty liver 02/15/2015  . Esophageal reflux 02/15/2015  . Late menses 07/30/2014  . Class 3 severe obesity with serious comorbidity and body mass index (BMI) greater than or equal to 70 in adult ([facility] 07/30/2014  . Asthma without acute exacerbation 07/30/2014  . IBS (irritable bowel syndrome) 07/30/2014  . Elevated BP 07/30/2014  . PCOS (polycystic ovarian syndrome) 11/25/2012  . Carpal tunnel syndrome, bilateral     Brandy Saunders,Brandy Saunders  PTA 11/13/2019, 5:43 PM  [address] [address]Phone: [phone]  Fax:  [phone] Name: Brandy PELLMANMRN: [id number] of Birth: [birth year]

## 2019-11-19 DIAGNOSIS — F4320 Adjustment disorder, unspecified: Secondary | ICD-10-CM | POA: Diagnosis not present

## 2019-11-20 ENCOUNTER — Ambulatory Visit: Payer: BC Managed Care – PPO | Admitting: Physical Therapy

## 2019-11-20 ENCOUNTER — Ambulatory Visit: Payer: BC Managed Care – PPO | Admitting: Family Medicine

## 2019-11-24 ENCOUNTER — Encounter (INDEPENDENT_AMBULATORY_CARE_PROVIDER_SITE_OTHER): Payer: Self-pay | Admitting: Family Medicine

## 2019-11-24 ENCOUNTER — Ambulatory Visit: Payer: BC Managed Care – PPO | Attending: Internal Medicine

## 2019-11-24 ENCOUNTER — Other Ambulatory Visit (INDEPENDENT_AMBULATORY_CARE_PROVIDER_SITE_OTHER): Payer: Self-pay

## 2019-11-24 DIAGNOSIS — Z23 Encounter for immunization: Secondary | ICD-10-CM

## 2019-11-24 DIAGNOSIS — F3289 Other specified depressive episodes: Secondary | ICD-10-CM

## 2019-11-24 MED ORDER — BUPROPION HCL ER (SR) 200 MG PO TB12: 200.0000 mg | ORAL_TABLET | Freq: Every day | ORAL | 0 refills | Status: DC

## 2019-11-24 NOTE — Progress Notes (Signed)
   Covid-19 Vaccination Clinic  Name:  CHERYEL COLOMB    MRN: [id number] DOB: 08-Jul-1991  11/24/2019  Ms. Megan Salon was observed post Covid-19 immunization for 15 minutes without incident. She was provided with Vaccine Information Sheet and instruction to access the V-Safe system.   Ms. Megan Salon was instructed to call 911 with any severe reactions post vaccine: Marland Kitchen Difficulty breathing  . Swelling of face and throat  . A fast heartbeat  . A bad rash all over body  . Dizziness and weakness   Immunizations Administered    Name Date Dose VIS Date Route   Pfizer COVID-19 Vaccine 11/24/2019 12:42 PM 0.3 mL 08/08/2019 Intramuscular   Manufacturer: [place]   Lot: [id number]   [place]: [id number]

## 2019-12-03 ENCOUNTER — Ambulatory Visit (INDEPENDENT_AMBULATORY_CARE_PROVIDER_SITE_OTHER): Payer: BC Managed Care – PPO | Admitting: Family Medicine

## 2019-12-03 ENCOUNTER — Encounter (INDEPENDENT_AMBULATORY_CARE_PROVIDER_SITE_OTHER): Payer: Self-pay | Admitting: Family Medicine

## 2019-12-03 ENCOUNTER — Other Ambulatory Visit: Payer: Self-pay

## 2019-12-03 VITALS — BP 121/62 | HR 66 | Temp 97.5°F | Ht 65.0 in | Wt >= 6400 oz

## 2019-12-03 DIAGNOSIS — I10 Essential (primary) hypertension: Secondary | ICD-10-CM | POA: Diagnosis not present

## 2019-12-03 DIAGNOSIS — F3289 Other specified depressive episodes: Secondary | ICD-10-CM

## 2019-12-03 DIAGNOSIS — R7303 Prediabetes: Secondary | ICD-10-CM

## 2019-12-03 DIAGNOSIS — Z9189 Other specified personal risk factors, not elsewhere classified: Secondary | ICD-10-CM

## 2019-12-03 DIAGNOSIS — Z6841 Body Mass Index (BMI) 40.0 and over, adult: Secondary | ICD-10-CM

## 2019-12-03 DIAGNOSIS — E66813 Obesity, class 3: Secondary | ICD-10-CM

## 2019-12-03 DIAGNOSIS — E559 Vitamin D deficiency, unspecified: Secondary | ICD-10-CM | POA: Diagnosis not present

## 2019-12-03 MED ORDER — BUPROPION HCL ER (SR) 200 MG PO TB12: 200.0000 mg | ORAL_TABLET | Freq: Every day | ORAL | 0 refills | Status: DC

## 2019-12-03 MED ORDER — METFORMIN HCL 500 MG PO TABS: 500.0000 mg | ORAL_TABLET | Freq: Every day | ORAL | 0 refills | Status: DC

## 2019-12-03 MED ORDER — METOPROLOL SUCCINATE ER 100 MG PO TB24: 100.0000 mg | ORAL_TABLET | Freq: Every day | ORAL | 0 refills | Status: DC

## 2019-12-03 MED ORDER — VITAMIN D (ERGOCALCIFEROL) 1.25 MG (50000 UNIT) PO CAPS: 50000.0000 [IU] | ORAL_CAPSULE | ORAL | 0 refills | Status: DC

## 2019-12-03 NOTE — Progress Notes (Signed)
Chief Complaint:   OBESITY Brandy Saunders is here to discuss her progress with her obesity treatment plan along with follow-up of her obesity related diagnoses. Brandy Saunders is on keeping a food journal and adhering to recommended goals of 1600-2000 calories and 100+ grams of protein daily and states she is following her eating plan approximately 75% of the time. Brandy Saunders states she is exercising with a person trainer and walking the dogs for 30 minutes 4 times per week.  Today's visit was #: 25 Starting weight: 447 lbs Starting date: 10/11/2017 Today's weight: 428 lbs Today's date: 12/03/2019 Total lbs lost to date: 19 Total lbs lost since last in-office visit: 4  Interim History: Brandy Saunders continues to do well with weight loss and has gotten better with journaling. She still has cravings but she is finding ways to indulge in smarter choices.  Subjective:   1. Vitamin D deficiency Brandy Saunders's Vit D level is not yet at goal. She denies nausea, vomiting, or muscle weakness.  2. Pre-diabetes Brandy Saunders is stable on metformin, and she denies nausea, vomiting, or hypoglycemia.  3. Essential hypertension Brandy Saunders's blood pressure is well controlled, and she denies chest pain. She is working on weight loss.   4. Other depression, with emotional eating Brandy Saunders increased Wellbutrin SR and she feels it is helping her mood and ability to meal plan with decreased emotional eating.  5. At risk for heart disease Brandy Saunders is at a higher than average risk for cardiovascular disease due to obesity.   Assessment/Plan:   1. Vitamin D deficiency Low Vitamin D level contributes to fatigue and are associated with obesity, breast, and colon cancer. We will refill prescription Vitamin D for 1 month. Alyzon will follow-up for routine testing of Vitamin D, at least 2-3 times per year to avoid over-replacement. We will check labs today.  - VITAMIN D 25 Hydroxy (Vit-D Deficiency, Fractures)  - Vitamin D,  Ergocalciferol, (DRISDOL) 1.25 MG (50000 UNIT) CAPS capsule; Take 1 capsule (50,000 Units total) by mouth every 7 (seven) days.  Dispense: 4 capsule; Refill: 0  2. Pre-diabetes Sherlie will continue to work on weight loss, diet, exercise, and decreasing simple carbohydrates to help decrease the risk of diabetes. We will refill metformin for 1 month. We will check labs today.  - Hemoglobin A1c - Insulin, random  - metFORMIN (GLUCOPHAGE) 500 MG tablet; Take 1 tablet (500 mg total) by mouth daily with breakfast.  Dispense: 30 tablet; Refill: 0  3. Essential hypertension Yanette is working on healthy weight loss and exercise to improve blood pressure control. We will watch for signs of hypotension as she continues her lifestyle modifications. We will refill metoprolol for 1 month. We will check labs today.  - Comprehensive metabolic panel - Lipid Panel With LDL/HDL Ratio  - metoprolol succinate (TOPROL-XL) 100 MG 24 hr tablet; Take 1 tablet (100 mg total) by mouth daily. Take with or immediately following a meal.  Dispense: 30 tablet; Refill: 0  4. Other depression, with emotional eating Behavior modification techniques were discussed today to help Brandy Saunders deal with her emotional/non-hunger eating behaviors. We will refill Wellbutrin SR for 1 month. Orders and follow up as documented in patient record.   - buPROPion (WELLBUTRIN SR) 200 MG 12 hr tablet; Take 1 tablet (200 mg total) by mouth daily.  Dispense: 30 tablet; Refill: 0  5. At risk for heart disease Brandy Saunders was given approximately 15 minutes of coronary artery disease prevention counseling today. She is 29 y.o. female and has risk  factors for heart disease including obesity. We discussed intensive lifestyle modifications today with an emphasis on specific weight loss instructions and strategies.   Repetitive spaced learning was employed today to elicit superior memory formation and behavioral change.  6. Class 3 severe obesity  with serious comorbidity and body mass index (BMI) greater than or equal to 70 in adult, unspecified obesity type (HCC) Brandy Saunders is currently in the action stage of change. As such, her goal is to continue with weight loss efforts. She has agreed to keeping a food journal and adhering to recommended goals of 1600-2000 calories and 100+ grams of protein daily.   Exercise goals: As is.  Behavioral modification strategies: better snacking choices.  Brandy Saunders has agreed to follow-up with our clinic in 4 weeks. She was informed of the importance of frequent follow-up visits to maximize her success with intensive lifestyle modifications for her multiple health conditions.   Brandy Saunders was informed we would discuss her lab results at her next visit unless there is a critical issue that needs to be addressed sooner. Brandy Saunders agreed to keep her next visit at the agreed upon time to discuss these results.  Objective:   Blood pressure 121/62, pulse 66, temperature (!) 97.5 F (36.4 C), temperature source Oral, height 5\' 5"  (1.651 m), weight (!) 428 lb (194.1 kg), SpO2 97 %. Body mass index is 71.22 kg/m.  General: Cooperative, alert, well developed, in no acute distress. HEENT: Conjunctivae and lids unremarkable. Cardiovascular: Regular rhythm.  Lungs: Normal work of breathing. Neurologic: No focal deficits.   Lab Results  Component Value Date   CREATININE 0.81 09/03/2019   BUN 14 09/03/2019   NA 138 09/03/2019   K 3.8 09/03/2019   CL 103 09/03/2019   CO2 24 09/03/2019   Lab Results  Component Value Date   ALT 35 (H) 06/17/2019   AST 26 06/17/2019   ALKPHOS 64 06/17/2019   BILITOT 0.5 06/17/2019   Lab Results  Component Value Date   HGBA1C 5.8 (H) 06/17/2019   HGBA1C 5.8 (H) 05/02/2018   HGBA1C 5.7 (H) 01/17/2018   HGBA1C 6.0 (H) 10/11/2017   HGBA1C 6.1 09/13/2017   Lab Results  Component Value Date   INSULIN 35.9 (H) 06/17/2019   INSULIN 30.3 (H) 05/02/2018   INSULIN 39.5 (H)  01/17/2018   INSULIN 73.6 (H) 10/11/2017   Lab Results  Component Value Date   TSH 2.050 06/17/2019   Lab Results  Component Value Date   CHOL 153 06/17/2019   HDL 52 06/17/2019   LDLCALC 79 06/17/2019   TRIG 127 06/17/2019   CHOLHDL 3 03/10/2019   Lab Results  Component Value Date   WBC 6.3 09/03/2019   HGB 13.4 09/03/2019   HCT 41.2 09/03/2019   MCV 85.7 09/03/2019   PLT 248 09/03/2019   No results found for: IRON, TIBC, FERRITIN  Attestation Statements:   Reviewed by clinician on day of visit: allergies, medications, problem list, medical history, surgical history, family history, social history, and previous encounter notes.   I, Trixie Dredge, am acting as transcriptionist for Dennard Nip, MD.  I have reviewed the above documentation for accuracy and completeness, and I agree with the above. -  Dennard Nip, MD

## 2019-12-04 ENCOUNTER — Ambulatory Visit: Payer: BC Managed Care – PPO | Admitting: Physical Therapy

## 2019-12-04 LAB — COMPREHENSIVE METABOLIC PANEL
ALT: 50 IU/L — ABNORMAL HIGH (ref 0–32)
AST: 36 IU/L (ref 0–40)
Albumin/Globulin Ratio: 1.7 (ref 1.2–2.2)
Albumin: 4.5 g/dL (ref 3.9–5.0)
Alkaline Phosphatase: 59 IU/L (ref 39–117)
BUN/Creatinine Ratio: 16 (ref 9–23)
BUN: 12 mg/dL (ref 6–20)
Bilirubin Total: 0.5 mg/dL (ref 0.0–1.2)
CO2: 24 mmol/L (ref 20–29)
Calcium: 9.3 mg/dL (ref 8.7–10.2)
Chloride: 103 mmol/L (ref 96–106)
Creatinine, Ser: 0.73 mg/dL (ref 0.57–1.00)
GFR calc Af Amer: 129 mL/min/{1.73_m2} (ref >59)
GFR calc non Af Amer: 112 mL/min/{1.73_m2} (ref >59)
Globulin, Total: 2.7 g/dL (ref 1.5–4.5)
Glucose: 104 mg/dL — ABNORMAL HIGH (ref 65–99)
Potassium: 4.6 mmol/L (ref 3.5–5.2)
Sodium: 141 mmol/L (ref 134–144)
Total Protein: 7.2 g/dL (ref 6.0–8.5)

## 2019-12-04 LAB — CBC WITH DIFFERENTIAL/PLATELET
Basophils Absolute: 0 10*3/uL (ref 0.0–0.2)
Basos: 1 %
EOS (ABSOLUTE): 0.1 10*3/uL (ref 0.0–0.4)
Eos: 2 %
Hematocrit: 43.2 % (ref 34.0–46.6)
Hemoglobin: 14.2 g/dL (ref 11.1–15.9)
Immature Grans (Abs): 0 10*3/uL (ref 0.0–0.1)
Immature Granulocytes: 0 %
Lymphocytes Absolute: 2 10*3/uL (ref 0.7–3.1)
Lymphs: 51 %
MCH: 28.3 pg (ref 26.6–33.0)
MCHC: 32.9 g/dL (ref 31.5–35.7)
MCV: 86 fL (ref 79–97)
Monocytes Absolute: 0.2 10*3/uL (ref 0.1–0.9)
Monocytes: 5 %
Neutrophils Absolute: 1.6 10*3/uL (ref 1.4–7.0)
Neutrophils: 41 %
Platelets: 239 10*3/uL (ref 150–450)
RBC: 5.02 x10E6/uL (ref 3.77–5.28)
RDW: 13.4 % (ref 11.7–15.4)
WBC: 3.9 10*3/uL (ref 3.4–10.8)

## 2019-12-04 LAB — LIPID PANEL WITH LDL/HDL RATIO
Cholesterol, Total: 166 mg/dL (ref 100–199)
HDL: 51 mg/dL (ref >39)
LDL Chol Calc (NIH): 93 mg/dL (ref 0–99)
LDL/HDL Ratio: 1.8 ratio (ref 0.0–3.2)
Triglycerides: 126 mg/dL (ref 0–149)
VLDL Cholesterol Cal: 22 mg/dL (ref 5–40)

## 2019-12-04 LAB — VITAMIN D 25 HYDROXY (VIT D DEFICIENCY, FRACTURES): Vit D, 25-Hydroxy: 39.8 ng/mL (ref 30.0–100.0)

## 2019-12-04 LAB — INSULIN, RANDOM: INSULIN: 30.8 u[IU]/mL — ABNORMAL HIGH (ref 2.6–24.9)

## 2019-12-04 LAB — HEMOGLOBIN A1C
Est. average glucose Bld gHb Est-mCnc: 114 mg/dL
Hgb A1c MFr Bld: 5.6 % (ref 4.8–5.6)

## 2019-12-10 DIAGNOSIS — F4320 Adjustment disorder, unspecified: Secondary | ICD-10-CM | POA: Diagnosis not present

## 2019-12-13 DIAGNOSIS — F4320 Adjustment disorder, unspecified: Secondary | ICD-10-CM | POA: Diagnosis not present

## 2019-12-16 ENCOUNTER — Other Ambulatory Visit: Payer: Self-pay

## 2019-12-16 ENCOUNTER — Encounter: Payer: Self-pay | Admitting: Family Medicine

## 2019-12-16 ENCOUNTER — Ambulatory Visit (INDEPENDENT_AMBULATORY_CARE_PROVIDER_SITE_OTHER): Payer: BC Managed Care – PPO | Admitting: Family Medicine

## 2019-12-16 VITALS — BP 124/84 | HR 70 | Temp 97.5°F | Resp 18 | Ht 65.0 in | Wt >= 6400 oz

## 2019-12-16 DIAGNOSIS — R21 Rash and other nonspecific skin eruption: Secondary | ICD-10-CM | POA: Diagnosis not present

## 2019-12-16 DIAGNOSIS — D229 Melanocytic nevi, unspecified: Secondary | ICD-10-CM | POA: Diagnosis not present

## 2019-12-16 MED ORDER — CLOTRIMAZOLE-BETAMETHASONE 1-0.05 % EX CREA: 1.0000 "application " | TOPICAL_CREAM | Freq: Two times a day (BID) | CUTANEOUS | 0 refills | Status: DC

## 2019-12-16 NOTE — Patient Instructions (Signed)

## 2019-12-16 NOTE — Assessment & Plan Note (Signed)
Responded to cortisone otc but not completely ---  Suspect tinea ----lotrisone If no improvement --- derm referral

## 2019-12-16 NOTE — Assessment & Plan Note (Signed)
Appear benign D/w pt what to look for in skin cancer rto if any changes

## 2019-12-16 NOTE — Progress Notes (Signed)
Patient ID: GOWRI GOODIN, female    DOB: 1991/03/30  Age: 29 y.o. MRN: [id number]    Subjective:  Subjective  HPI GEORGANA STROM presents for rash around neck that keeps coming back and is itchy.  She has been using otc cortisone cream on it.   First started last summer.   She also has a few moles on her back she wants [place] to look at.   No other complaints   Review of Systems  Constitutional: Negative for appetite change, diaphoresis, fatigue and unexpected weight change.  Eyes: Negative for pain, redness and visual disturbance.  Respiratory: Negative for cough, chest tightness, shortness of breath and wheezing.   Cardiovascular: Negative for chest pain, palpitations and leg swelling.  Endocrine: Negative for cold intolerance, heat intolerance, polydipsia, polyphagia and polyuria.  Genitourinary: Negative for difficulty urinating, dysuria and frequency.  Skin: Positive for rash.  Neurological: Negative for dizziness, light-headedness, numbness and headaches.    History Past Medical History:  Diagnosis Date  . Alcohol abuse   . Anxiety   . Asthma   . Back pain   . Binge eating   . Carpal tunnel syndrome, bilateral   . Chest pain   . Common migraine with intractable migraine 02/02/2017  . Constipation   . Depression   . Dyspnea   . Fatty liver   . GERD (gastroesophageal reflux disease)   . Hypertension   . Joint pain   . Leg edema   . Morbidly obese ([place])   . Palpitations   . PCOS (polycystic ovarian syndrome)   . Prediabetes   . Sleep apnea   . Vaginal Pap smear, abnormal   . Vitamin D deficiency     She has a past surgical history that includes Tonsilectomy, adenoidectomy, bilateral myringotomy and tubes (1994).   Her family history includes Alcoholism in her father; Diabetes in her maternal uncle; Drug abuse in her father; Heart disease in her maternal grandmother and mother; Hyperlipidemia in her mother; Hypertension in her mother; Obesity in her father and  mother; Stroke in her father; Sudden death in her father.She reports that she has quit smoking. Her smoking use included cigarettes. She has never used smokeless tobacco. She reports current alcohol use of about 3.0 standard drinks of alcohol per week. She reports that she does not use drugs.  Current Outpatient Medications on File Prior to Visit  Medication Sig Dispense Refill  . acetaminophen (TYLENOL) 500 MG tablet Take 500 mg by mouth every 6 (six) hours as needed.    . Ascorbic Acid (VITAMIN C) 1000 MG tablet Take 1,000 mg by mouth daily.    Marland Kitchen BIOTIN 5000 PO Take 10,000 mg by mouth daily.     Marland Kitchen buPROPion (WELLBUTRIN SR) 200 MG 12 hr tablet Take 1 tablet (200 mg total) by mouth daily. 30 tablet 0  . Cyanocobalamin (VITAMIN B 12 PO) Take 2,500 mcg by mouth daily.    . cyclobenzaprine (FLEXERIL) 5 MG tablet Take 1-2 tablets (5-10 mg total) by mouth 3 (three) times daily as needed for muscle spasms. 60 tablet 1  . medroxyPROGESTERone (PROVERA) 10 MG tablet Take 1 tablet (10 mg total) by mouth daily. Use for ten days 10 tablet 2  . metFORMIN (GLUCOPHAGE) 500 MG tablet Take 1 tablet (500 mg total) by mouth daily with breakfast. 30 tablet 0  . metoprolol succinate (TOPROL-XL) 100 MG 24 hr tablet Take 1 tablet (100 mg total) by mouth daily. Take with or immediately following a meal.  30 tablet 0  . Multiple Vitamins-Calcium (ONE-A-DAY WOMENS PO) Take 1 tablet by mouth daily.     . Vitamin D, Ergocalciferol, (DRISDOL) 1.25 MG (50000 UNIT) CAPS capsule Take 1 capsule (50,000 Units total) by mouth every 7 (seven) days. 4 capsule 0   No current facility-administered medications on file prior to visit.     Objective:  Objective  Physical Exam Vitals and nursing note reviewed.  Skin:    Findings: Rash present. Rash is macular and scaling.           BP 124/84 (BP Location: Right Arm, Patient Position: Sitting, Cuff Size: Large)   Pulse 70   Temp (!) 97.5 F (36.4 C) (Temporal)   Resp 18   Ht  5\' 5"  (1.651 m)   Wt (!) 436 lb 9.6 oz (198 kg)   SpO2 100%   BMI 72.65 kg/m  Wt Readings from Last 3 Encounters:  12/16/19 (!) 436 lb 9.6 oz (198 kg)  12/03/19 (!) 428 lb (194.1 kg)  11/05/19 (!) 432 lb (196 kg)     Lab Results  Component Value Date   WBC 3.9 12/03/2019   HGB 14.2 12/03/2019   HCT 43.2 12/03/2019   PLT 239 12/03/2019   GLUCOSE 104 (H) 12/03/2019   CHOL 166 12/03/2019   TRIG 126 12/03/2019   HDL 51 12/03/2019   LDLCALC 93 12/03/2019   ALT 50 (H) 12/03/2019   AST 36 12/03/2019   NA 141 12/03/2019   K 4.6 12/03/2019   CL 103 12/03/2019   CREATININE 0.73 12/03/2019   BUN 12 12/03/2019   CO2 24 12/03/2019   TSH 2.050 06/17/2019   HGBA1C 5.6 12/03/2019    DG Chest 2 View  Result Date: 09/03/2019 CLINICAL DATA:  Chest pain EXAM: CHEST - 2 VIEW COMPARISON:  12/03/2018 FINDINGS: Cardiac shadow is within normal limits. The lungs are well aerated bilaterally. No focal infiltrate or sizable effusion is seen. No bony abnormality is noted. IMPRESSION: No active cardiopulmonary disease. Electronically Signed   By: Inez Catalina M.D.   On: 09/03/2019 21:39     Assessment & Plan:  Plan  I have discontinued Lamonica L. Campbell's gabapentin. I am also having her start on clotrimazole-betamethasone. Additionally, I am having her maintain her Multiple Vitamins-Calcium (ONE-A-DAY WOMENS PO), BIOTIN 5000 PO, vitamin C, acetaminophen, Cyanocobalamin (VITAMIN B 12 PO), medroxyPROGESTERone, cyclobenzaprine, buPROPion, Vitamin D (Ergocalciferol), metoprolol succinate, and metFORMIN.  Meds ordered this encounter  Medications  . clotrimazole-betamethasone (LOTRISONE) cream    Sig: Apply 1 application topically 2 (two) times daily.    Dispense:  30 g    Refill:  0    Problem List Items Addressed This Visit      Unprioritized   [place]    Appear benign D/w pt what to look for in skin cancer rto if any changes       Rash - Primary     Responded to cortisone otc but not  completely ---  Suspect tinea ----lotrisone If no improvement --- derm referral       Relevant Medications   clotrimazole-betamethasone (LOTRISONE) cream      Follow-up: Return if symptoms worsen or fail to improve.  Ann Held, DO

## 2019-12-17 ENCOUNTER — Ambulatory Visit: Payer: BC Managed Care – PPO | Attending: Internal Medicine

## 2019-12-17 DIAGNOSIS — Z23 Encounter for immunization: Secondary | ICD-10-CM

## 2019-12-17 NOTE — Progress Notes (Signed)
   Covid-19 Vaccination Clinic  Name:  DARLEN STAVIS    MRN: [id number] DOB: Dec 29, 1990  12/17/2019  Ms. Megan Salon was observed post Covid-19 immunization for 15 minutes without incident. She was provided with Vaccine Information Sheet and instruction to access the V-Safe system.   Ms. Megan Salon was instructed to call 911 with any severe reactions post vaccine: Marland Kitchen Difficulty breathing  . Swelling of face and throat  . A fast heartbeat  . A bad rash all over body  . Dizziness and weakness   Immunizations Administered    Name Date Dose VIS Date Route   Pfizer COVID-19 Vaccine 12/17/2019  9:49 AM 0.3 mL 10/22/2018 Intramuscular   Manufacturer: [place]   Lot: [id number]   [place]: [id number]

## 2019-12-24 ENCOUNTER — Other Ambulatory Visit (INDEPENDENT_AMBULATORY_CARE_PROVIDER_SITE_OTHER): Payer: Self-pay | Admitting: Family Medicine

## 2019-12-24 DIAGNOSIS — F4320 Adjustment disorder, unspecified: Secondary | ICD-10-CM | POA: Diagnosis not present

## 2019-12-24 DIAGNOSIS — E559 Vitamin D deficiency, unspecified: Secondary | ICD-10-CM

## 2019-12-31 ENCOUNTER — Ambulatory Visit (INDEPENDENT_AMBULATORY_CARE_PROVIDER_SITE_OTHER): Payer: BC Managed Care – PPO | Admitting: Family Medicine

## 2019-12-31 ENCOUNTER — Other Ambulatory Visit: Payer: Self-pay

## 2019-12-31 ENCOUNTER — Encounter (INDEPENDENT_AMBULATORY_CARE_PROVIDER_SITE_OTHER): Payer: Self-pay | Admitting: Family Medicine

## 2019-12-31 VITALS — BP 107/70 | HR 62 | Temp 97.7°F | Ht 65.0 in | Wt >= 6400 oz

## 2019-12-31 DIAGNOSIS — Z6841 Body Mass Index (BMI) 40.0 and over, adult: Secondary | ICD-10-CM

## 2019-12-31 DIAGNOSIS — F3289 Other specified depressive episodes: Secondary | ICD-10-CM

## 2019-12-31 DIAGNOSIS — I10 Essential (primary) hypertension: Secondary | ICD-10-CM | POA: Diagnosis not present

## 2019-12-31 DIAGNOSIS — R7303 Prediabetes: Secondary | ICD-10-CM

## 2019-12-31 DIAGNOSIS — Z9189 Other specified personal risk factors, not elsewhere classified: Secondary | ICD-10-CM

## 2019-12-31 DIAGNOSIS — E559 Vitamin D deficiency, unspecified: Secondary | ICD-10-CM | POA: Diagnosis not present

## 2019-12-31 MED ORDER — VITAMIN D (ERGOCALCIFEROL) 1.25 MG (50000 UNIT) PO CAPS: 50000.0000 [IU] | ORAL_CAPSULE | ORAL | 0 refills | Status: DC

## 2019-12-31 MED ORDER — METOPROLOL SUCCINATE ER 100 MG PO TB24: 100.0000 mg | ORAL_TABLET | Freq: Every day | ORAL | 0 refills | Status: DC

## 2019-12-31 MED ORDER — BUPROPION HCL ER (SR) 150 MG PO TB12: 150.0000 mg | ORAL_TABLET | Freq: Two times a day (BID) | ORAL | 0 refills | Status: DC

## 2019-12-31 MED ORDER — METFORMIN HCL 500 MG PO TABS: 500.0000 mg | ORAL_TABLET | Freq: Every day | ORAL | 0 refills | Status: DC

## 2019-12-31 NOTE — Progress Notes (Signed)
Chief Complaint:   OBESITY Brandy Saunders is here to discuss her progress with her obesity treatment plan along with follow-up of her obesity related diagnoses. Brandy Saunders is on keeping a food journal and adhering to recommended goals of 1600-2000 calories and 100+ grams of protein daily and states she is following her eating plan approximately 70% of the time. Brandy Saunders states she is exercising with a personal trainer and walking for 30 minutes 3-4 times per week.  Today's visit was #: 26 Starting weight: 447 lbs Starting date: 10/11/2017 Today's weight: 430 lbs Today's date: 12/31/2019 Total lbs lost to date: 17 Total lbs lost since last in-office visit: 0  Interim History: Brandy Saunders has been doing more with journaling and exercise, but she is retaining some fluid today. Her work shift has changed to daytime hours which is good but she is still adjusting her eating to this.  Subjective:   1. Vitamin D deficiency Brandy Saunders is stable on Vit D, and she denies nausea, vomiting, or muscle weakness. Her Vit D level is improving, but not yet at goal. I discussed labs with the patient.   2. Pre-diabetes Brandy Saunders's A1c is improving with diet and metformin. She denies nausea, vomiting, or hypoglycemia. I discussed labs with the patient today.  3. Essential hypertension Brandy Saunders's blood pressure is well controlled on metoprolol. She denies chest pain or lightheadedness.  4. Other depression, with emotional eating Brandy Saunders is stable on Wellbutrin, but she has had changes in work and she feels she may be struggling with emotional eating more.  5. At high risk for fluid overload Brandy Saunders is at a higher than average risk for fluid retention due to obesity and fluid retention. Reviewed: no chest pain on exertion, no dyspnea at rest, and no swelling of ankles.  Assessment/Plan:   1. Vitamin D deficiency Low Vitamin D level contributes to fatigue and are associated with obesity, breast, and colon  cancer. We will refill prescription Vitamin D for 1 month. Brandy Saunders will follow-up for routine testing of Vitamin D, at least 2-3 times per year to avoid over-replacement.  - Vitamin D, Ergocalciferol, (DRISDOL) 1.25 MG (50000 UNIT) CAPS capsule; Take 1 capsule (50,000 Units total) by mouth every 7 (seven) days.  Dispense: 4 capsule; Refill: 0  2. Pre-diabetes Brandy Saunders will continue to work on weight loss, diet, exercise, and decreasing simple carbohydrates to help decrease the risk of diabetes. We will refill metformin for 1 month.  - metFORMIN (GLUCOPHAGE) 500 MG tablet; Take 1 tablet (500 mg total) by mouth daily with breakfast.  Dispense: 30 tablet; Refill: 0  3. Essential hypertension Brandy Saunders is working on diet, healthy weight loss, and exercise to improve blood pressure control. We will watch for signs of hypotension as she continues her lifestyle modifications. We will refill metoprolol for 1 month.  - metoprolol succinate (TOPROL-XL) 100 MG 24 hr tablet; Take 1 tablet (100 mg total) by mouth daily. Take with or immediately following a meal.  Dispense: 30 tablet; Refill: 0  4. Other depression, with emotional eating Behavior modification techniques were discussed today to help Brandy Saunders deal with her emotional/non-hunger eating behaviors. We will refill Wellbutrin SR for 1 month. Orders and follow up as documented in patient record.   - buPROPion (WELLBUTRIN SR) 150 MG 12 hr tablet; Take 1 tablet (150 mg total) by mouth 2 (two) times daily.  Dispense: 60 tablet; Refill: 0  5. At high risk for fluid overload Brandy Saunders was given approximately 15 minutes of fluid retention prevention counseling  today. She is 29 y.o. female and has risk factors for fluid retention including obesity and fluid retention. She is to decrease Na+ and simple carbohydrates and will follow closely. We discussed intensive lifestyle modifications today with an emphasis on specific weight loss instructions, proper  nutrition and exercise strategies.   Repetitive spaced learning was employed today to elicit superior memory formation and behavioral change.  6. Class 3 severe obesity with serious comorbidity and body mass index (BMI) greater than or equal to 70 in adult, unspecified obesity type (HCC) Brandy Saunders is currently in the action stage of change. As such, her goal is to continue with weight loss efforts. She has agreed to keeping a food journal and adhering to recommended goals of 1600-2000 calories and 100+ grams of protein daily.   Brandy Saunders is to work on getting 100 grams of protein from "real food" sources and not from supplements.  Exercise goals: As is.  Behavioral modification strategies: increasing lean protein intake.  Brandy Saunders has agreed to follow-up with our clinic in 3 weeks. She was informed of the importance of frequent follow-up visits to maximize her success with intensive lifestyle modifications for her multiple health conditions.   Objective:   Blood pressure 107/70, pulse 62, temperature 97.7 F (36.5 C), temperature source Oral, height 5\' 5"  (1.651 m), weight (!) 430 lb (195 kg), SpO2 100 %. Body mass index is 71.56 kg/m.  General: Cooperative, alert, well developed, in no acute distress. HEENT: Conjunctivae and lids unremarkable. Cardiovascular: Regular rhythm.  Lungs: Normal work of breathing. Neurologic: No focal deficits.   Lab Results  Component Value Date   CREATININE 0.73 12/03/2019   BUN 12 12/03/2019   NA 141 12/03/2019   K 4.6 12/03/2019   CL 103 12/03/2019   CO2 24 12/03/2019   Lab Results  Component Value Date   ALT 50 (H) 12/03/2019   AST 36 12/03/2019   ALKPHOS 59 12/03/2019   BILITOT 0.5 12/03/2019   Lab Results  Component Value Date   HGBA1C 5.6 12/03/2019   HGBA1C 5.8 (H) 06/17/2019   HGBA1C 5.8 (H) 05/02/2018   HGBA1C 5.7 (H) 01/17/2018   HGBA1C 6.0 (H) 10/11/2017   Lab Results  Component Value Date   INSULIN 30.8 (H) 12/03/2019    INSULIN 35.9 (H) 06/17/2019   INSULIN 30.3 (H) 05/02/2018   INSULIN 39.5 (H) 01/17/2018   INSULIN 73.6 (H) 10/11/2017   Lab Results  Component Value Date   TSH 2.050 06/17/2019   Lab Results  Component Value Date   CHOL 166 12/03/2019   HDL 51 12/03/2019   LDLCALC 93 12/03/2019   TRIG 126 12/03/2019   CHOLHDL 3 03/10/2019   Lab Results  Component Value Date   WBC 3.9 12/03/2019   HGB 14.2 12/03/2019   HCT 43.2 12/03/2019   MCV 86 12/03/2019   PLT 239 12/03/2019   No results found for: IRON, TIBC, FERRITIN  Attestation Statements:   Reviewed by clinician on day of visit: allergies, medications, problem list, medical history, surgical history, family history, social history, and previous encounter notes.   I, Trixie Dredge, am acting as transcriptionist for Dennard Nip, MD.  I have reviewed the above documentation for accuracy and completeness, and I agree with the above. -  Dennard Nip, MD

## 2020-01-09 DIAGNOSIS — F4320 Adjustment disorder, unspecified: Secondary | ICD-10-CM | POA: Diagnosis not present

## 2020-01-13 DIAGNOSIS — Z20828 Contact with and (suspected) exposure to other viral communicable diseases: Secondary | ICD-10-CM | POA: Diagnosis not present

## 2020-01-13 DIAGNOSIS — Z03818 Encounter for observation for suspected exposure to other biological agents ruled out: Secondary | ICD-10-CM | POA: Diagnosis not present

## 2020-01-21 ENCOUNTER — Ambulatory Visit (INDEPENDENT_AMBULATORY_CARE_PROVIDER_SITE_OTHER): Payer: BC Managed Care – PPO | Admitting: Family Medicine

## 2020-01-21 ENCOUNTER — Encounter (INDEPENDENT_AMBULATORY_CARE_PROVIDER_SITE_OTHER): Payer: Self-pay | Admitting: Family Medicine

## 2020-01-21 ENCOUNTER — Other Ambulatory Visit: Payer: Self-pay

## 2020-01-21 VITALS — BP 108/76 | HR 73 | Temp 97.9°F | Ht 65.0 in | Wt >= 6400 oz

## 2020-01-21 DIAGNOSIS — I10 Essential (primary) hypertension: Secondary | ICD-10-CM | POA: Diagnosis not present

## 2020-01-21 DIAGNOSIS — R7303 Prediabetes: Secondary | ICD-10-CM

## 2020-01-21 DIAGNOSIS — Z6841 Body Mass Index (BMI) 40.0 and over, adult: Secondary | ICD-10-CM

## 2020-01-21 DIAGNOSIS — F3289 Other specified depressive episodes: Secondary | ICD-10-CM | POA: Diagnosis not present

## 2020-01-21 DIAGNOSIS — E559 Vitamin D deficiency, unspecified: Secondary | ICD-10-CM

## 2020-01-21 DIAGNOSIS — Z9189 Other specified personal risk factors, not elsewhere classified: Secondary | ICD-10-CM | POA: Diagnosis not present

## 2020-01-21 MED ORDER — VITAMIN D (ERGOCALCIFEROL) 1.25 MG (50000 UNIT) PO CAPS: 50000.0000 [IU] | ORAL_CAPSULE | ORAL | 0 refills | Status: DC

## 2020-01-21 MED ORDER — BUPROPION HCL ER (SR) 150 MG PO TB12: 150.0000 mg | ORAL_TABLET | Freq: Two times a day (BID) | ORAL | 0 refills | Status: DC

## 2020-01-21 MED ORDER — METOPROLOL SUCCINATE ER 100 MG PO TB24: 100.0000 mg | ORAL_TABLET | Freq: Every day | ORAL | 0 refills | Status: DC

## 2020-01-21 MED ORDER — METFORMIN HCL 500 MG PO TABS: 500.0000 mg | ORAL_TABLET | Freq: Every day | ORAL | 0 refills | Status: DC

## 2020-01-21 NOTE — Progress Notes (Signed)
Chief Complaint:   OBESITY Brandy Saunders is here to discuss her progress with her obesity treatment plan along with follow-up of her obesity related diagnoses. Brandy Saunders is on keeping a food journal and adhering to recommended goals of 1600-2000 calories and 100+ grams of protein daily and states she is following her eating plan approximately 50% of the time. Brandy Saunders states she is exercising with a personal trainer and dance videos for 30 minutes 3 times per week.  Today's visit was #: 27 Starting weight: 447 lbs Starting date: 10/11/2017 Today's weight: 432 lbs Today's date: 01/21/2020 Total lbs lost to date: 15 Total lbs lost since last in-office visit: 0  Interim History: Brandy Saunders feels overwhelmed with thinking about meal planning and prepping. She estimates to charts meals about 5% of the time, and will also only follow her calorie and protein goals about 50% of the time.  Subjective:   1. Vitamin D deficiency Brandy Saunders's Vit D level on 12/03/2019 was 39.8. She is on prescription strength Vit D supplementation, and is tolerating it well.  2. Other depression, with emotional eating Brandy Saunders reports stable mood, and denies suicidal ideas or homicidal ideas. She is on bupropion SR 150 mg BID.  3. Pre-diabetes Brandy Saunders's A1c on 12/03/2019 was 5.6 and insulin level was 30.8. She is on metformin 500 mg with breakfast, and is tolerating it well.  4. Essential hypertension Brandy Saunders's blood pressure is well controlled at her office visit today. She is on a beta blocker.  5. At risk for diarrhea Brandy Saunders is at risk for diarrhea due to metformin use.  Assessment/Plan:   1. Vitamin D deficiency Low Vitamin D level contributes to fatigue and are associated with obesity, breast, and colon cancer. We will refill prescription Vitamin D for 1 month. Brandy Saunders will follow-up for routine testing of Vitamin D, at least 2-3 times per year to avoid over-replacement.  - Vitamin D, Ergocalciferol,  (DRISDOL) 1.25 MG (50000 UNIT) CAPS capsule; Take 1 capsule (50,000 Units total) by mouth every 7 (seven) days.  Dispense: 4 capsule; Refill: 0  2. Other depression, with emotional eating Behavior modification techniques were discussed today to help Brandy Saunders deal with her emotional/non-hunger eating behaviors. We will refill bupropion SR for 1 month. Orders and follow up as documented in patient record.   - buPROPion (WELLBUTRIN SR) 150 MG 12 hr tablet; Take 1 tablet (150 mg total) by mouth 2 (two) times daily.  Dispense: 60 tablet; Refill: 0  3. Pre-diabetes Brandy Saunders will continue to work on weight loss, exercise, and decreasing simple carbohydrates to help decrease the risk of diabetes. We will refill metformin for 1 month.   - metFORMIN (GLUCOPHAGE) 500 MG tablet; Take 1 tablet (500 mg total) by mouth daily with breakfast.  Dispense: 30 tablet; Refill: 0  4. Essential hypertension Brandy Saunders is working on healthy weight loss and exercise to improve blood pressure control. We will watch for signs of hypotension as she continues her lifestyle modifications. We will refill metoprolol for 1 month.  - metoprolol succinate (TOPROL-XL) 100 MG 24 hr tablet; Take 1 tablet (100 mg total) by mouth daily. Take with or immediately following a meal.  Dispense: 30 tablet; Refill: 0  5. At risk for diarrhea Brandy Saunders was given approximately 15 minutes of diarrhea prevention counseling today. She is 29 y.o. female and has risk factors for diarrhea including medications and changes in diet. We discussed intensive lifestyle modifications today with an emphasis on specific weight loss instructions including dietary strategies.  Repetitive spaced learning was employed today to elicit superior memory formation and behavioral change.  6. Class 3 severe obesity with serious comorbidity and body mass index (BMI) greater than or equal to 70 in adult, unspecified obesity type (HCC) Brandy Saunders is currently in the action  stage of change. As such, her goal is to continue with weight loss efforts. She has agreed to keeping a food journal and adhering to recommended goals of 1600-2000 calories and 100+ grams of protein daily.   Handout provided today: Multiple recipes.  Exercise goals: As is.  Behavioral modification strategies: increasing lean protein intake and no skipping meals.  Brandy Saunders has agreed to follow-up with our clinic in 3 weeks. She was informed of the importance of frequent follow-up visits to maximize her success with intensive lifestyle modifications for her multiple health conditions.   Objective:   Blood pressure 108/76, pulse 73, temperature 97.9 F (36.6 C), temperature source Oral, height 5\' 5"  (1.651 m), weight (!) 432 lb (196 kg), SpO2 96 %. Body mass index is 71.89 kg/m.  General: Cooperative, alert, well developed, in no acute distress. HEENT: Conjunctivae and lids unremarkable. Cardiovascular: Regular rhythm.  Lungs: Normal work of breathing. Neurologic: No focal deficits.   Lab Results  Component Value Date   CREATININE 0.73 12/03/2019   BUN 12 12/03/2019   NA 141 12/03/2019   K 4.6 12/03/2019   CL 103 12/03/2019   CO2 24 12/03/2019   Lab Results  Component Value Date   ALT 50 (H) 12/03/2019   AST 36 12/03/2019   ALKPHOS 59 12/03/2019   BILITOT 0.5 12/03/2019   Lab Results  Component Value Date   HGBA1C 5.6 12/03/2019   HGBA1C 5.8 (H) 06/17/2019   HGBA1C 5.8 (H) 05/02/2018   HGBA1C 5.7 (H) 01/17/2018   HGBA1C 6.0 (H) 10/11/2017   Lab Results  Component Value Date   INSULIN 30.8 (H) 12/03/2019   INSULIN 35.9 (H) 06/17/2019   INSULIN 30.3 (H) 05/02/2018   INSULIN 39.5 (H) 01/17/2018   INSULIN 73.6 (H) 10/11/2017   Lab Results  Component Value Date   TSH 2.050 06/17/2019   Lab Results  Component Value Date   CHOL 166 12/03/2019   HDL 51 12/03/2019   LDLCALC 93 12/03/2019   TRIG 126 12/03/2019   CHOLHDL 3 03/10/2019   Lab Results  Component  Value Date   WBC 3.9 12/03/2019   HGB 14.2 12/03/2019   HCT 43.2 12/03/2019   MCV 86 12/03/2019   PLT 239 12/03/2019   No results found for: IRON, TIBC, FERRITIN  Attestation Statements:   Reviewed by clinician on day of visit: allergies, medications, problem list, medical history, surgical history, family history, social history, and previous encounter notes.   I, Trixie Dredge, am acting as transcriptionist for Dennard Nip, MD.  I have reviewed the above documentation for accuracy and completeness, and I agree with the above. -  Dennard Nip, MD

## 2020-01-22 DIAGNOSIS — E559 Vitamin D deficiency, unspecified: Secondary | ICD-10-CM | POA: Insufficient documentation

## 2020-01-24 ENCOUNTER — Other Ambulatory Visit (INDEPENDENT_AMBULATORY_CARE_PROVIDER_SITE_OTHER): Payer: Self-pay | Admitting: Family Medicine

## 2020-01-24 DIAGNOSIS — E559 Vitamin D deficiency, unspecified: Secondary | ICD-10-CM

## 2020-01-24 DIAGNOSIS — F4320 Adjustment disorder, unspecified: Secondary | ICD-10-CM | POA: Diagnosis not present

## 2020-01-30 DIAGNOSIS — F4320 Adjustment disorder, unspecified: Secondary | ICD-10-CM | POA: Diagnosis not present

## 2020-01-31 ENCOUNTER — Other Ambulatory Visit (INDEPENDENT_AMBULATORY_CARE_PROVIDER_SITE_OTHER): Payer: Self-pay | Admitting: Family Medicine

## 2020-01-31 DIAGNOSIS — F3289 Other specified depressive episodes: Secondary | ICD-10-CM

## 2020-02-12 ENCOUNTER — Ambulatory Visit (INDEPENDENT_AMBULATORY_CARE_PROVIDER_SITE_OTHER): Payer: BC Managed Care – PPO | Admitting: Family Medicine

## 2020-02-14 DIAGNOSIS — F4320 Adjustment disorder, unspecified: Secondary | ICD-10-CM | POA: Diagnosis not present

## 2020-02-15 ENCOUNTER — Encounter (INDEPENDENT_AMBULATORY_CARE_PROVIDER_SITE_OTHER): Payer: Self-pay | Admitting: Family Medicine

## 2020-02-16 ENCOUNTER — Other Ambulatory Visit (INDEPENDENT_AMBULATORY_CARE_PROVIDER_SITE_OTHER): Payer: Self-pay | Admitting: *Deleted

## 2020-02-16 DIAGNOSIS — I10 Essential (primary) hypertension: Secondary | ICD-10-CM

## 2020-02-16 MED ORDER — METOPROLOL SUCCINATE ER 100 MG PO TB24: 100.0000 mg | ORAL_TABLET | Freq: Every day | ORAL | 0 refills | Status: DC

## 2020-02-23 ENCOUNTER — Other Ambulatory Visit (INDEPENDENT_AMBULATORY_CARE_PROVIDER_SITE_OTHER): Payer: Self-pay | Admitting: Family Medicine

## 2020-02-23 DIAGNOSIS — E559 Vitamin D deficiency, unspecified: Secondary | ICD-10-CM

## 2020-02-25 ENCOUNTER — Ambulatory Visit (INDEPENDENT_AMBULATORY_CARE_PROVIDER_SITE_OTHER): Payer: BC Managed Care – PPO | Admitting: Family Medicine

## 2020-02-26 ENCOUNTER — Encounter: Payer: Self-pay | Admitting: Family Medicine

## 2020-02-26 ENCOUNTER — Other Ambulatory Visit: Payer: Self-pay

## 2020-02-26 ENCOUNTER — Ambulatory Visit (INDEPENDENT_AMBULATORY_CARE_PROVIDER_SITE_OTHER): Payer: BC Managed Care – PPO | Admitting: Family Medicine

## 2020-02-26 DIAGNOSIS — M2550 Pain in unspecified joint: Secondary | ICD-10-CM

## 2020-02-26 DIAGNOSIS — R21 Rash and other nonspecific skin eruption: Secondary | ICD-10-CM

## 2020-02-26 DIAGNOSIS — Z0001 Encounter for general adult medical examination with abnormal findings: Secondary | ICD-10-CM | POA: Diagnosis not present

## 2020-02-26 DIAGNOSIS — N913 Primary oligomenorrhea: Secondary | ICD-10-CM

## 2020-02-26 DIAGNOSIS — R5383 Other fatigue: Secondary | ICD-10-CM | POA: Diagnosis not present

## 2020-02-26 DIAGNOSIS — N912 Amenorrhea, unspecified: Secondary | ICD-10-CM

## 2020-02-26 DIAGNOSIS — H9313 Tinnitus, bilateral: Secondary | ICD-10-CM

## 2020-02-26 DIAGNOSIS — R350 Frequency of micturition: Secondary | ICD-10-CM

## 2020-02-26 DIAGNOSIS — Z6841 Body Mass Index (BMI) 40.0 and over, adult: Secondary | ICD-10-CM | POA: Diagnosis not present

## 2020-02-26 DIAGNOSIS — R7303 Prediabetes: Secondary | ICD-10-CM

## 2020-02-26 DIAGNOSIS — Z0000 Encounter for general adult medical examination without abnormal findings: Secondary | ICD-10-CM | POA: Diagnosis not present

## 2020-02-26 DIAGNOSIS — H9193 Unspecified hearing loss, bilateral: Secondary | ICD-10-CM

## 2020-02-26 LAB — LIPID PANEL
Cholesterol: 165 mg/dL (ref 0–200)
HDL: 57.5 mg/dL (ref >39.00)
LDL Cholesterol: 87 mg/dL (ref 0–99)
NonHDL: 107.34
Total CHOL/HDL Ratio: 3
Triglycerides: 104 mg/dL (ref 0.0–149.0)
VLDL: 20.8 mg/dL (ref 0.0–40.0)

## 2020-02-26 LAB — COMPREHENSIVE METABOLIC PANEL
ALT: 36 U/L — ABNORMAL HIGH (ref 0–35)
AST: 22 U/L (ref 0–37)
Albumin: 4.3 g/dL (ref 3.5–5.2)
Alkaline Phosphatase: 49 U/L (ref 39–117)
BUN: 12 mg/dL (ref 6–23)
CO2: 29 mEq/L (ref 19–32)
Calcium: 8.9 mg/dL (ref 8.4–10.5)
Chloride: 102 mEq/L (ref 96–112)
Creatinine, Ser: 0.75 mg/dL (ref 0.40–1.20)
GFR: 109.86 mL/min (ref >60.00)
Glucose, Bld: 101 mg/dL — ABNORMAL HIGH (ref 70–99)
Potassium: 4.3 mEq/L (ref 3.5–5.1)
Sodium: 138 mEq/L (ref 135–145)
Total Bilirubin: 0.4 mg/dL (ref 0.2–1.2)
Total Protein: 7 g/dL (ref 6.0–8.3)

## 2020-02-26 LAB — CBC WITH DIFFERENTIAL/PLATELET
Basophils Absolute: 0 10*3/uL (ref 0.0–0.1)
Basophils Relative: 0.4 % (ref 0.0–3.0)
Eosinophils Absolute: 0.1 10*3/uL (ref 0.0–0.7)
Eosinophils Relative: 1.9 % (ref 0.0–5.0)
HCT: 40.6 % (ref 36.0–46.0)
Hemoglobin: 13.5 g/dL (ref 12.0–15.0)
Lymphocytes Relative: 45 % (ref 12.0–46.0)
Lymphs Abs: 2 10*3/uL (ref 0.7–4.0)
MCHC: 33.3 g/dL (ref 30.0–36.0)
MCV: 84.6 fl (ref 78.0–100.0)
Monocytes Absolute: 0.2 10*3/uL (ref 0.1–1.0)
Monocytes Relative: 3.8 % (ref 3.0–12.0)
Neutro Abs: 2.2 10*3/uL (ref 1.4–7.7)
Neutrophils Relative %: 48.9 % (ref 43.0–77.0)
Platelets: 256 10*3/uL (ref 150.0–400.0)
RBC: 4.8 Mil/uL (ref 3.87–5.11)
RDW: 13.5 % (ref 11.5–15.5)
WBC: 4.5 10*3/uL (ref 4.0–10.5)

## 2020-02-26 LAB — VITAMIN B12: Vitamin B-12: 1237 pg/mL — ABNORMAL HIGH (ref 211–911)

## 2020-02-26 LAB — TSH: TSH: 1.9 u[IU]/mL (ref 0.35–4.50)

## 2020-02-26 LAB — VITAMIN D 25 HYDROXY (VIT D DEFICIENCY, FRACTURES): VITD: 34.27 ng/mL (ref 30.00–100.00)

## 2020-02-26 MED ORDER — RYBELSUS 3 MG PO TABS: ORAL_TABLET | ORAL | 2 refills | Status: DC

## 2020-02-26 NOTE — Patient Instructions (Signed)
Preventive Care 21-30 Years Old, Female Preventive care refers to visits with your health care provider and lifestyle choices that can promote health and wellness. This includes:  A yearly physical exam. This may also be called an annual well check.  Regular dental visits and eye exams.  Immunizations.  Screening for certain conditions.  Healthy lifestyle choices, such as eating a healthy diet, getting regular exercise, not using drugs or products that contain nicotine and tobacco, and limiting alcohol use. What can I expect for my preventive care visit? Physical exam Your health care provider will check your:  Height and weight. This may be used to calculate body mass index (BMI), which tells if you are at a healthy weight.  Heart rate and blood pressure.  Skin for abnormal spots. Counseling Your health care provider may ask you questions about your:  Alcohol, tobacco, and drug use.  Emotional well-being.  Home and relationship well-being.  Sexual activity.  Eating habits.  Work and work environment.  Method of birth control.  Menstrual cycle.  Pregnancy history. What immunizations do I need?  Influenza (flu) vaccine  This is recommended every year. Tetanus, diphtheria, and pertussis (Tdap) vaccine  You may need a Td booster every 10 years. Varicella (chickenpox) vaccine  You may need this if you have not been vaccinated. Human papillomavirus (HPV) vaccine  If recommended by your health care provider, you may need three doses over 6 months. Measles, mumps, and rubella (MMR) vaccine  You may need at least one dose of MMR. You may also need a second dose. Meningococcal conjugate (MenACWY) vaccine  One dose is recommended if you are age 19-21 years and a first-year college student living in a residence hall, or if you have one of several medical conditions. You may also need additional booster doses. Pneumococcal conjugate (PCV13) vaccine  You may need  this if you have certain conditions and were not previously vaccinated. Pneumococcal polysaccharide (PPSV23) vaccine  You may need one or two doses if you smoke cigarettes or if you have certain conditions. Hepatitis A vaccine  You may need this if you have certain conditions or if you travel or work in places where you may be exposed to hepatitis A. Hepatitis B vaccine  You may need this if you have certain conditions or if you travel or work in places where you may be exposed to hepatitis B. Haemophilus influenzae type b (Hib) vaccine  You may need this if you have certain conditions. You may receive vaccines as individual doses or as more than one vaccine together in one shot (combination vaccines). Talk with your health care provider about the risks and benefits of combination vaccines. What tests do I need?  Blood tests  Lipid and cholesterol levels. These may be checked every 5 years starting at age 20.  Hepatitis C test.  Hepatitis B test. Screening  Diabetes screening. This is done by checking your blood sugar (glucose) after you have not eaten for a while (fasting).  Sexually transmitted disease (STD) testing.  BRCA-related cancer screening. This may be done if you have a family history of breast, ovarian, tubal, or peritoneal cancers.  Pelvic exam and Pap test. This may be done every 3 years starting at age 21. Starting at age 30, this may be done every 5 years if you have a Pap test in combination with an HPV test. Talk with your health care provider about your test results, treatment options, and if necessary, the need for more tests.   Follow these instructions at home: Eating and drinking   Eat a diet that includes fresh fruits and vegetables, whole grains, lean protein, and low-fat dairy.  Take vitamin and mineral supplements as recommended by your health care provider.  Do not drink alcohol if: ? Your health care provider tells you not to drink. ? You are  pregnant, may be pregnant, or are planning to become pregnant.  If you drink alcohol: ? Limit how much you have to 0-1 drink a day. ? Be aware of how much alcohol is in your drink. In the [place] one drink equals one 12 oz bottle of beer (355 mL), one 5 oz glass of wine (148 mL), or one 1 oz glass of hard liquor (44 mL). Lifestyle  Take daily care of your teeth and gums.  Stay active. Exercise for at least 30 minutes on 5 or more days each week.  Do not use any products that contain nicotine or tobacco, such as cigarettes, e-cigarettes, and chewing tobacco. If you need help quitting, ask your health care provider.  If you are sexually active, practice safe sex. Use a condom or other form of birth control (contraception) in order to prevent pregnancy and STIs (sexually transmitted infections). If you plan to become pregnant, see your health care provider for a preconception visit. What's next?  Visit your health care provider once a year for a well check visit.  Ask your health care provider how often you should have your eyes and teeth checked.  Stay up to date on all vaccines. This information is not intended to replace advice given to you by your health care provider. Make sure you discuss any questions you have with your health care provider. Document Revised: 04/25/2018 Document Reviewed: 04/25/2018 Elsevier Patient Education  2020 [employer].

## 2020-02-26 NOTE — Progress Notes (Signed)
Subjective:     Brandy Saunders is a 29 y.o. female and is here for a comprehensive physical exam. The patient reports problems - fatigue, headaches and ringing in the ears x month or more.  .  Social History   Socioeconomic History  . Marital status: Significant Other    Spouse name: Engineer, maintenance (IT)  . Number of children: 0  . Years of education: 53  . Highest education level: Not on file  Occupational History  . Occupation: Public relations account executive  Tobacco Use  . Smoking status: Former Smoker    Types: Cigarettes  . Smokeless tobacco: Never Used  Vaping Use  . Vaping Use: Former  Substance and Sexual Activity  . Alcohol use: Yes    Alcohol/week: 3.0 standard drinks    Types: 3 Glasses of wine per week  . Drug use: No    Types: Marijuana    Comment: once every 6 months, taking CBD  . Sexual activity: Yes    Partners: Male    Birth control/protection: Condom, Pill    Comment: inconsistent condom use  Other Topics Concern  . Not on file  Social History Narrative   Lives with a roommate.     Parents live in [place], [place].   Right handed   Caffeine use: coffee weekly   Social Determinants of Health   Financial Resource Strain:   . Difficulty of Paying Living Expenses:   Food Insecurity:   . Worried About Charity fundraiser in the Last Year:   . Arboriculturist in the Last Year:   Transportation Needs:   . Film/video editor (Medical):   Marland Kitchen Lack of Transportation (Non-Medical):   Physical Activity:   . Days of Exercise per Week:   . Minutes of Exercise per Session:   Stress:   . Feeling of Stress :   Social Connections:   . Frequency of Communication with Friends and Family:   . Frequency of Social Gatherings with Friends and Family:   . Attends Religious Services:   . Active Member of Clubs or Organizations:   . Attends Archivist Meetings:   Marland Kitchen Marital Status:   Intimate Partner Violence:   . Fear of Current or Ex-Partner:   . Emotionally Abused:    Marland Kitchen Physically Abused:   . Sexually Abused:    Health Maintenance  Topic Date Due  . INFLUENZA VACCINE  03/28/2020  . PAP-Cervical Cytology Screening  05/13/2022  . PAP SMEAR-Modifier  05/13/2022  . TETANUS/TDAP  11/07/2029  . COVID-19 Vaccine  Completed  . Hepatitis C Screening  Completed  . HIV Screening  Completed    The following portions of the patient's history were reviewed and updated as appropriate:  She  has a past medical history of Alcohol abuse, Anxiety, Asthma, Back pain, Binge eating, Carpal tunnel syndrome, bilateral, Chest pain, Common migraine with intractable migraine (02/02/2017), Constipation, Depression, Dyspnea, Fatty liver, GERD (gastroesophageal reflux disease), Hypertension, Joint pain, Leg edema, Morbidly obese ([place]), Palpitations, PCOS (polycystic ovarian syndrome), Prediabetes, Sleep apnea, Vaginal Pap smear, abnormal, and Vitamin D deficiency. She does not have any pertinent problems on file. She  has a past surgical history that includes Tonsilectomy, adenoidectomy, bilateral myringotomy and tubes (1994). Her family history includes Alcoholism in her father; Diabetes in her maternal uncle; Drug abuse in her father; Heart disease in her maternal grandmother and mother; Hyperlipidemia in her mother; Hypertension in her mother; Obesity in her father and mother; Stroke in her  father; Sudden death in her father. She  reports that she has quit smoking. Her smoking use included cigarettes. She has never used smokeless tobacco. She reports current alcohol use of about 3.0 standard drinks of alcohol per week. She reports that she does not use drugs. She has a current medication list which includes the following prescription(s): acetaminophen, vitamin c, biotin, bupropion, clotrimazole-betamethasone, cyanocobalamin, cyclobenzaprine, medroxyprogesterone, metformin, metoprolol succinate, multiple vitamins-minerals, and vitamin d (ergocalciferol). Current Outpatient Medications  on File Prior to Visit  Medication Sig Dispense Refill  . acetaminophen (TYLENOL) 500 MG tablet Take 500 mg by mouth every 6 (six) hours as needed.    . Ascorbic Acid (VITAMIN C) 1000 MG tablet Take 1,000 mg by mouth daily.    Marland Kitchen BIOTIN 5000 PO Take 10,000 mg by mouth daily.     Marland Kitchen buPROPion (WELLBUTRIN SR) 150 MG 12 hr tablet Take 1 tablet (150 mg total) by mouth 2 (two) times daily. 60 tablet 0  . clotrimazole-betamethasone (LOTRISONE) cream Apply 1 application topically 2 (two) times daily. 30 g 0  . Cyanocobalamin (VITAMIN B 12 PO) Take 2,500 mcg by mouth daily.    . cyclobenzaprine (FLEXERIL) 5 MG tablet Take 1-2 tablets (5-10 mg total) by mouth 3 (three) times daily as needed for muscle spasms. 60 tablet 1  . medroxyPROGESTERone (PROVERA) 10 MG tablet Take 1 tablet (10 mg total) by mouth daily. Use for ten days 10 tablet 2  . metFORMIN (GLUCOPHAGE) 500 MG tablet Take 1 tablet (500 mg total) by mouth daily with breakfast. 30 tablet 0  . metoprolol succinate (TOPROL-XL) 100 MG 24 hr tablet Take 1 tablet (100 mg total) by mouth daily. Take with or immediately following a meal. 30 tablet 0  . Multiple Vitamins-Calcium (ONE-A-DAY WOMENS PO) Take 1 tablet by mouth daily.     . Vitamin D, Ergocalciferol, (DRISDOL) 1.25 MG (50000 UNIT) CAPS capsule Take 1 capsule (50,000 Units total) by mouth every 7 (seven) days. 4 capsule 0   No current facility-administered medications on file prior to visit.   She is allergic to ibuprofen and meloxicam.. Review of Systems  Constitutional: Negative for activity change, appetite change and fatigue.  HENT: Negative for hearing loss, congestion, tinnitus and ear discharge.  dentist q38m Eyes: Negative for visual disturbance (see optho q1y -- vision corrected to 20/20 with glasses).  Respiratory: Negative for cough, chest tightness and shortness of breath.   Cardiovascular: Negative for chest pain, palpitations and leg swelling.  Gastrointestinal: Negative for  abdominal pain, diarrhea, constipation and abdominal distention.  Genitourinary: Negative for urgency, frequency, decreased urine volume and difficulty urinating.  Musculoskeletal: Negative for back pain, arthralgias and gait problem.  Skin: Negative for color change, pallor and rash.  Neurological: Negative for dizziness, light-headedness, numbness and headaches.  Hematological: Negative for adenopathy. Does not bruise/bleed easily.  Psychiatric/Behavioral: Negative for suicidal ideas, confusion, sleep disturbance, self-injury, dysphoric mood, decreased concentration and agitation.     Review of Systems Review of Systems  Constitutional: Negative for activity change, appetite change + fatigue   HENT: Negative for , congestion,  and ear discharge.  dentist q42m  + tinnitus and hearing loss that comes and goes  Eyes: Negative for visual disturbance (see optho q1y -- vision corrected to 20/20 with glasses).  Respiratory: Negative for cough, chest tightness and shortness of breath.   Cardiovascular: Negative for chest pain, palpitations and leg swelling.  Gastrointestinal: Negative for abdominal pain, diarrhea, constipation and abdominal distention.  Genitourinary: Negative for urgency, frequency,  decreased urine volume and difficulty urinating.  Musculoskeletal: Negative for back pain, arthralgias and gait problem.  Skin: Negative for color change, pallor and rash.   Neurological: Negative for dizziness, light-headedness, numbness  + headaches  Hematological: Negative for adenopathy. Does not bruise/bleed easily.  Psychiatric/Behavioral: Negative for suicidal ideas, confusion, sleep disturbance, self-injury, dysphoric mood, decreased concentration and agitation.       Objective:    BP 134/88 (BP Location: Right Arm, Patient Position: Sitting, Cuff Size: Large)   Pulse 76   Temp 97.7 F (36.5 C) (Temporal)   Resp 20   Ht 5\' 5"  (1.651 m)   Wt (!) 447 lb 6.4 oz (202.9 kg)   LMP  10/08/2019   SpO2 100%   BMI 74.45 kg/m  General appearance: alert, cooperative, appears stated age and no distress Head: Normocephalic, without obvious abnormality, atraumatic Eyes: negative findings: lids and lashes normal, conjunctivae and sclerae normal and pupils equal, round, reactive to light and accomodation Ears: normal TM's and external ear canals both ears Neck: no adenopathy, no carotid bruit, no JVD, supple, symmetrical, trachea midline and thyroid not enlarged, symmetric, no tenderness/mass/nodules Back: symmetric, no curvature. ROM normal. No CVA tenderness. Lungs: clear to auscultation bilaterally Breasts: gyn Heart: regular rate and rhythm, S1, S2 normal, no murmur, click, rub or gallop Abdomen: soft, non-tender; bowel sounds normal; no masses,  no organomegaly Pelvic: deferred --gyn Extremities: extremities normal, atraumatic, no cyanosis or edema Pulses: 2+ and symmetric Skin: Skin color, texture, turgor normal. No rashes or lesions Lymph nodes: Cervical, supraclavicular, and axillary nodes normal. Neurologic: Alert and oriented X 3, normal strength and tone. Normal symmetric reflexes. Normal coordination and gait    Assessment:    Healthy female exam.      Plan:     ghm utd Check labs  See After Visit Summary for Counseling Recommendations    1. Well woman exam with routine gynecological exam Gyn exam per gyn - TSH - Lipid panel - CBC with Differential/Platelet - Comprehensive metabolic panel  2. Primary oligomenorrhea Per gyn  3. Obesity, morbid, BMI 50 or higher ([place]) con't healthy weight and wellness   4. Urinary frequency  - POCT Urinalysis Dipstick (Automated)  5. Fatigue, unspecified type Check labs  Weight loss and exercise can help  - TSH - CBC with Differential/Platelet - Vitamin B12 - Comprehensive metabolic panel - Vitamin D (25 hydroxy) - POCT Urinalysis Dipstick (Automated) - Epstein-Barr virus VCA antibody panel  6.  Prediabetes F/u healthy weight and wellness  - Semaglutide (RYBELSUS) 3 MG TABS; 1 po qd  Dispense: 30 tablet; Refill: 2  7. Tinnitus of both ears With hearing loss  - Ambulatory referral to ENT  8. Bilateral hearing loss, unspecified hearing loss type   - Ambulatory referral to ENT  9. Arthralgia, unspecified joint  - Antinuclear Antib (ANA) - Rheumatoid Factor  10. Rash  - Antinuclear Antib (ANA) - Rheumatoid Factor  11. Amenorrhea, unspecified F/u gyn - hCG, serum, qualitative

## 2020-02-27 DIAGNOSIS — F4320 Adjustment disorder, unspecified: Secondary | ICD-10-CM | POA: Diagnosis not present

## 2020-02-28 DIAGNOSIS — F4320 Adjustment disorder, unspecified: Secondary | ICD-10-CM | POA: Diagnosis not present

## 2020-03-02 ENCOUNTER — Encounter: Payer: Self-pay | Admitting: Family Medicine

## 2020-03-02 LAB — RHEUMATOID FACTOR: Rheumatoid fact SerPl-aCnc: <14 IU/mL (ref <14)

## 2020-03-02 LAB — HCG, SERUM, QUALITATIVE: Preg, Serum: NEGATIVE

## 2020-03-02 LAB — ANTI-NUCLEAR AB-TITER (ANA TITER): ANA Titer 1: 1:80 {titer} — ABNORMAL HIGH

## 2020-03-02 LAB — EPSTEIN-BARR VIRUS VCA ANTIBODY PANEL
EBV NA IgG: 183 U/mL — ABNORMAL HIGH
EBV VCA IgG: >750 U/mL — ABNORMAL HIGH
EBV VCA IgM: <36 U/mL

## 2020-03-02 LAB — ANA: Anti Nuclear Antibody (ANA): POSITIVE — AB

## 2020-03-03 ENCOUNTER — Other Ambulatory Visit: Payer: Self-pay

## 2020-03-03 DIAGNOSIS — R768 Other specified abnormal immunological findings in serum: Secondary | ICD-10-CM

## 2020-03-04 ENCOUNTER — Ambulatory Visit (INDEPENDENT_AMBULATORY_CARE_PROVIDER_SITE_OTHER): Payer: BC Managed Care – PPO | Admitting: Family Medicine

## 2020-03-04 ENCOUNTER — Encounter (INDEPENDENT_AMBULATORY_CARE_PROVIDER_SITE_OTHER): Payer: Self-pay | Admitting: Family Medicine

## 2020-03-04 ENCOUNTER — Other Ambulatory Visit: Payer: Self-pay

## 2020-03-04 VITALS — BP 125/77 | HR 71 | Temp 97.7°F | Ht 65.0 in | Wt >= 6400 oz

## 2020-03-04 DIAGNOSIS — R5383 Other fatigue: Secondary | ICD-10-CM | POA: Diagnosis not present

## 2020-03-04 DIAGNOSIS — I10 Essential (primary) hypertension: Secondary | ICD-10-CM

## 2020-03-04 DIAGNOSIS — Z9189 Other specified personal risk factors, not elsewhere classified: Secondary | ICD-10-CM

## 2020-03-04 DIAGNOSIS — E559 Vitamin D deficiency, unspecified: Secondary | ICD-10-CM | POA: Diagnosis not present

## 2020-03-04 DIAGNOSIS — Z6841 Body Mass Index (BMI) 40.0 and over, adult: Secondary | ICD-10-CM

## 2020-03-04 MED ORDER — METOPROLOL SUCCINATE ER 100 MG PO TB24: 100.0000 mg | ORAL_TABLET | Freq: Every day | ORAL | 0 refills | Status: DC

## 2020-03-04 MED ORDER — VITAMIN D (ERGOCALCIFEROL) 1.25 MG (50000 UNIT) PO CAPS: 50000.0000 [IU] | ORAL_CAPSULE | ORAL | 0 refills | Status: DC

## 2020-03-09 NOTE — Progress Notes (Signed)
Chief Complaint:   OBESITY Brandy Saunders is here to discuss her progress with her obesity treatment plan along with follow-up of her obesity related diagnoses. Brandy Saunders is on keeping a food journal and adhering to recommended goals of 1600-2000 calories and 100+ grams of protein daily and states she is following her eating plan approximately 40% of the time. Brandy Saunders states she is doing 0 minutes 0 times per week.  Today's visit was #: 28 Starting weight: 447 lbs Starting date: 10/11/2017 Today's weight: 446 lbs Today's date: 03/04/2020 Total lbs lost to date: 1 Total lbs lost since last in-office visit: 0  Interim History: Brandy Saunders continues to struggle with weight loss, and she has been considering looking into weight loss surgery. She is frustrated with her health issues including fatigue and a recent positive ANA.  Subjective:   1. Essential hypertension Brandy Saunders blood pressure is well controlled on her medications, and she requests a refill today.  2. Other fatigue Brandy Saunders struggles with excessive fatigue which does not appear to be completely explained by her weight. She has an appointment with Rheumatology to look for other causes of fatigue.  3. Vitamin D deficiency Brandy Saunders is stable on Vit D, and she requests a refill today. Brandy Saunders added 2,000 IU daily OTC Vit D to help increase her Vit D level.  4. At risk for diabetes mellitus Brandy Saunders is at higher than average risk for developing diabetes due to her obesity.   Assessment/Plan:   1. Essential hypertension [name] is working on healthy weight loss and exercise to improve blood pressure control. We will watch for signs of hypotension as she continues her lifestyle modifications. We will refill Toprol for 1 month.  - metoprolol succinate (TOPROL-XL) 100 MG 24 hr tablet; Take 1 tablet (100 mg total) by mouth daily. Take with or immediately following a meal.  Dispense: 30 tablet; Refill: 0  2. Other  fatigue [name] was encouraged to persue causes of fatigue and will continue to work on diet and exercise in the meanwhile.  3. Vitamin D deficiency Low Vitamin D level contributes to fatigue and are associated with obesity, breast, and colon cancer. We will refill prescription Vitamin D for 1 month. [name] will follow-up for routine testing of Vitamin D, at least 2-3 times per year to avoid over-replacement.  - Vitamin D, Ergocalciferol, (DRISDOL) 1.25 MG (50000 UNIT) CAPS capsule; Take 1 capsule (50,000 Units total) by mouth every 7 (seven) days.  Dispense: 4 capsule; Refill: 0  4. At risk for diabetes mellitus Brandy Saunders was given approximately 30 minutes of diabetes education and counseling today. We discussed intensive lifestyle modifications today with an emphasis on weight loss as well as increasing exercise and decreasing simple carbohydrates in her diet. We also reviewed medication options with an emphasis on risk versus benefit of those discussed.   Repetitive spaced learning was employed today to elicit superior memory formation and behavioral change.  5. Class 3 severe obesity with serious comorbidity and body mass index (BMI) greater than or equal to 70 in adult, unspecified obesity type (HCC) Brandy Saunders is currently in the action stage of change. As such, her goal is to continue with weight loss efforts. She has agreed to keeping a food journal and adhering to recommended goals of 1600-2000 calories and 100+ grams of protein daily.   Brandy Saunders was referred to [facility]. She was given a weight loss surgery overview and was encouraged to go to an information session for further details.  Behavioral  modification strategies: increasing lean protein intake, emotional eating strategies and keeping a strict food journal.  Brandy Saunders has agreed to follow-up with our clinic in 3 weeks. She was informed of the importance of frequent follow-up visits to maximize her success with  intensive lifestyle modifications for her multiple health conditions.   Objective:   Blood pressure 125/77, pulse 71, temperature 97.7 F (36.5 C), temperature source Oral, height 5\' 5"  (1.651 m), weight (!) 446 lb (202.3 kg), last menstrual period 10/06/2019, SpO2 100 %. Body mass index is 74.22 kg/m.  General: Cooperative, alert, well developed, in no acute distress. HEENT: Conjunctivae and lids unremarkable. Cardiovascular: Regular rhythm.  Lungs: Normal work of breathing. Neurologic: No focal deficits.   Lab Results  Component Value Date   CREATININE 0.75 02/26/2020   BUN 12 02/26/2020   NA 138 02/26/2020   K 4.3 02/26/2020   CL 102 02/26/2020   CO2 29 02/26/2020   Lab Results  Component Value Date   ALT 36 (H) 02/26/2020   AST 22 02/26/2020   ALKPHOS 49 02/26/2020   BILITOT 0.4 02/26/2020   Lab Results  Component Value Date   HGBA1C 5.6 12/03/2019   HGBA1C 5.8 (H) 06/17/2019   HGBA1C 5.8 (H) 05/02/2018   HGBA1C 5.7 (H) 01/17/2018   HGBA1C 6.0 (H) 10/11/2017   Lab Results  Component Value Date   INSULIN 30.8 (H) 12/03/2019   INSULIN 35.9 (H) 06/17/2019   INSULIN 30.3 (H) 05/02/2018   INSULIN 39.5 (H) 01/17/2018   INSULIN 73.6 (H) 10/11/2017   Lab Results  Component Value Date   TSH 1.90 02/26/2020   Lab Results  Component Value Date   CHOL 165 02/26/2020   HDL 57.50 02/26/2020   LDLCALC 87 02/26/2020   TRIG 104.0 02/26/2020   CHOLHDL 3 02/26/2020   Lab Results  Component Value Date   WBC 4.5 02/26/2020   HGB 13.5 02/26/2020   HCT 40.6 02/26/2020   MCV 84.6 02/26/2020   PLT 256.0 02/26/2020   No results found for: IRON, TIBC, FERRITIN  Attestation Statements:   Reviewed by clinician on day of visit: allergies, medications, problem list, medical history, surgical history, family history, social history, and previous encounter notes.   I, Trixie Dredge, am acting as transcriptionist for Dennard Nip, MD.  I have reviewed the above  documentation for accuracy and completeness, and I agree with the above. -  Dennard Nip, MD

## 2020-03-12 DIAGNOSIS — Z03818 Encounter for observation for suspected exposure to other biological agents ruled out: Secondary | ICD-10-CM | POA: Diagnosis not present

## 2020-03-12 DIAGNOSIS — Z20822 Contact with and (suspected) exposure to covid-19: Secondary | ICD-10-CM | POA: Diagnosis not present

## 2020-03-16 DIAGNOSIS — M791 Myalgia, unspecified site: Secondary | ICD-10-CM | POA: Diagnosis not present

## 2020-03-16 DIAGNOSIS — M2550 Pain in unspecified joint: Secondary | ICD-10-CM | POA: Diagnosis not present

## 2020-03-16 DIAGNOSIS — R768 Other specified abnormal immunological findings in serum: Secondary | ICD-10-CM | POA: Diagnosis not present

## 2020-03-16 DIAGNOSIS — I890 Lymphedema, not elsewhere classified: Secondary | ICD-10-CM | POA: Diagnosis not present

## 2020-03-18 DIAGNOSIS — F4320 Adjustment disorder, unspecified: Secondary | ICD-10-CM | POA: Diagnosis not present

## 2020-03-21 ENCOUNTER — Other Ambulatory Visit (INDEPENDENT_AMBULATORY_CARE_PROVIDER_SITE_OTHER): Payer: Self-pay | Admitting: Family Medicine

## 2020-03-21 DIAGNOSIS — E559 Vitamin D deficiency, unspecified: Secondary | ICD-10-CM

## 2020-03-27 ENCOUNTER — Encounter (INDEPENDENT_AMBULATORY_CARE_PROVIDER_SITE_OTHER): Payer: Self-pay | Admitting: Family Medicine

## 2020-03-27 DIAGNOSIS — F4320 Adjustment disorder, unspecified: Secondary | ICD-10-CM | POA: Diagnosis not present

## 2020-03-29 ENCOUNTER — Ambulatory Visit (INDEPENDENT_AMBULATORY_CARE_PROVIDER_SITE_OTHER): Payer: BC Managed Care – PPO | Admitting: Family Medicine

## 2020-03-31 DIAGNOSIS — Z20822 Contact with and (suspected) exposure to covid-19: Secondary | ICD-10-CM | POA: Diagnosis not present

## 2020-03-31 DIAGNOSIS — Z03818 Encounter for observation for suspected exposure to other biological agents ruled out: Secondary | ICD-10-CM | POA: Diagnosis not present

## 2020-03-31 NOTE — Addendum Note (Signed)
Addended by: Roma Schanz R on: 03/31/2020 02:16 PM   Modules accepted: Level of Service

## 2020-04-09 DIAGNOSIS — F4320 Adjustment disorder, unspecified: Secondary | ICD-10-CM | POA: Diagnosis not present

## 2020-04-17 ENCOUNTER — Other Ambulatory Visit (INDEPENDENT_AMBULATORY_CARE_PROVIDER_SITE_OTHER): Payer: Self-pay | Admitting: Family Medicine

## 2020-04-17 DIAGNOSIS — F3289 Other specified depressive episodes: Secondary | ICD-10-CM

## 2020-04-17 DIAGNOSIS — F4320 Adjustment disorder, unspecified: Secondary | ICD-10-CM | POA: Diagnosis not present

## 2020-04-17 DIAGNOSIS — E559 Vitamin D deficiency, unspecified: Secondary | ICD-10-CM

## 2020-04-19 ENCOUNTER — Other Ambulatory Visit (INDEPENDENT_AMBULATORY_CARE_PROVIDER_SITE_OTHER): Payer: Self-pay | Admitting: Family Medicine

## 2020-04-19 DIAGNOSIS — F3289 Other specified depressive episodes: Secondary | ICD-10-CM

## 2020-04-19 DIAGNOSIS — E559 Vitamin D deficiency, unspecified: Secondary | ICD-10-CM

## 2020-04-19 DIAGNOSIS — I10 Essential (primary) hypertension: Secondary | ICD-10-CM

## 2020-04-19 MED ORDER — BUPROPION HCL ER (SR) 150 MG PO TB12: 150.0000 mg | ORAL_TABLET | Freq: Every day | ORAL | 0 refills | Status: DC

## 2020-04-19 MED ORDER — VITAMIN D (ERGOCALCIFEROL) 1.25 MG (50000 UNIT) PO CAPS: 50000.0000 [IU] | ORAL_CAPSULE | ORAL | 0 refills | Status: DC

## 2020-04-19 MED ORDER — METOPROLOL SUCCINATE ER 100 MG PO TB24: 100.0000 mg | ORAL_TABLET | Freq: Every day | ORAL | 0 refills | Status: DC

## 2020-04-19 NOTE — Addendum Note (Signed)
Addended by: Karren Cobble on: 04/19/2020 10:39 AM   Modules accepted: Orders

## 2020-04-22 DIAGNOSIS — H93291 Other abnormal auditory perceptions, right ear: Secondary | ICD-10-CM | POA: Diagnosis not present

## 2020-04-22 DIAGNOSIS — H9313 Tinnitus, bilateral: Secondary | ICD-10-CM | POA: Diagnosis not present

## 2020-04-22 DIAGNOSIS — L299 Pruritus, unspecified: Secondary | ICD-10-CM | POA: Diagnosis not present

## 2020-04-23 ENCOUNTER — Encounter: Payer: BC Managed Care – PPO | Admitting: Family Medicine

## 2020-05-01 DIAGNOSIS — F4320 Adjustment disorder, unspecified: Secondary | ICD-10-CM | POA: Diagnosis not present

## 2020-05-04 ENCOUNTER — Other Ambulatory Visit: Payer: Self-pay

## 2020-05-04 ENCOUNTER — Ambulatory Visit (INDEPENDENT_AMBULATORY_CARE_PROVIDER_SITE_OTHER): Payer: BC Managed Care – PPO | Admitting: Family Medicine

## 2020-05-04 ENCOUNTER — Encounter (INDEPENDENT_AMBULATORY_CARE_PROVIDER_SITE_OTHER): Payer: Self-pay | Admitting: Family Medicine

## 2020-05-04 VITALS — BP 105/70 | HR 75 | Temp 98.5°F | Ht 65.0 in | Wt >= 6400 oz

## 2020-05-04 DIAGNOSIS — I890 Lymphedema, not elsewhere classified: Secondary | ICD-10-CM | POA: Diagnosis not present

## 2020-05-04 DIAGNOSIS — I10 Essential (primary) hypertension: Secondary | ICD-10-CM | POA: Diagnosis not present

## 2020-05-04 DIAGNOSIS — F3289 Other specified depressive episodes: Secondary | ICD-10-CM

## 2020-05-04 DIAGNOSIS — E559 Vitamin D deficiency, unspecified: Secondary | ICD-10-CM

## 2020-05-04 DIAGNOSIS — R7303 Prediabetes: Secondary | ICD-10-CM

## 2020-05-04 DIAGNOSIS — Z9189 Other specified personal risk factors, not elsewhere classified: Secondary | ICD-10-CM

## 2020-05-04 DIAGNOSIS — Z6841 Body Mass Index (BMI) 40.0 and over, adult: Secondary | ICD-10-CM

## 2020-05-04 MED ORDER — VITAMIN D (ERGOCALCIFEROL) 1.25 MG (50000 UNIT) PO CAPS: 50000.0000 [IU] | ORAL_CAPSULE | ORAL | 0 refills | Status: DC

## 2020-05-04 MED ORDER — RYBELSUS 3 MG PO TABS: ORAL_TABLET | ORAL | 2 refills | Status: DC

## 2020-05-04 MED ORDER — BUPROPION HCL ER (SR) 200 MG PO TB12: 200.0000 mg | ORAL_TABLET | Freq: Every day | ORAL | 0 refills | Status: DC

## 2020-05-04 MED ORDER — METOPROLOL SUCCINATE ER 100 MG PO TB24: 100.0000 mg | ORAL_TABLET | Freq: Every day | ORAL | 0 refills | Status: DC

## 2020-05-04 NOTE — Progress Notes (Signed)
Chief Complaint:   OBESITY Brandy Saunders is here to discuss her progress with her obesity treatment plan along with follow-up of her obesity related diagnoses. [name] is on keeping a food journal and adhering to recommended goals of 1600-2000 calories and 100+ grams of protein daily and states she is following her eating plan approximately 50% of the time. [name] states she is doing 0 minutes 0 times per week.  Today's visit was #: 70 Starting weight: 447 lbs Starting date: 10/11/2017 Today's weight: 454 lbs Today's date: 05/04/2020 Total lbs lost to date: 0 Total lbs lost since last in-office visit: 0  Interim History: [name]'s last visit was 2 months ago. She has a COVID exposure last month and tested negative. She was also on vacation and wasn't concentrating on weight loss as much. She feels her cravings and temptations have been a big issues. She considered weight loss surgery but her insurance wouldn't cover.  Subjective:   1. Essential hypertension Syrina's blood pressures is well controlled on her medications. She is working on diet and she requests a refill today.  2. Pre-diabetes Remington had struggled to take Rybelsus due to timing of her food. She has questions about how to take the medications.  3. Vitamin D deficiency Kamrie is on Vit D OTC and Vit D prescription. Her Vit D level is not yet at goal.  4. Lymphedema Toshua has questions about lyphedema treatment, and she would like a referral for treatment. She was diagnosed previously.  5. Other depression, with emotional eating [name] has been off her Wellbutrin and she is struggling more with increased emotional eating. She had been on a higher dose previously and this may have been working better for her.  6. At high risk for fluid overload [name] is at a higher than average risk for fluid retention due to obesity and lymphedema.  Assessment/Plan:   1. Essential hypertension [name] is working on  healthy weight loss and exercise to improve blood pressure control. We will watch for signs of hypotension as she continues her lifestyle modifications. We will refill metoprolol for 1 month.  - metoprolol succinate (TOPROL-XL) 100 MG 24 hr tablet; Take 1 tablet (100 mg total) by mouth daily. Take with or immediately following a meal.  Dispense: 30 tablet; Refill: 0  2. Pre-diabetes [name] will continue to work on weight loss, exercise, and decreasing simple carbohydrates to help decrease the risk of diabetes. [name] agreed to restart Rybelsus at 3 mg, we discussed how to take first thing in the morning and wait approximately 30 minutes to eat but no much longer.  - Semaglutide (RYBELSUS) 3 MG TABS; 1 po qd  Dispense: 30 tablet; Refill: 2  3. Vitamin D deficiency Low Vitamin D level contributes to fatigue and are associated with obesity, breast, and colon cancer. We will refill prescription Vitamin D for 1 month. [name] will follow-up for routine testing of Vitamin D, at least 2-3 times per year to avoid over-replacement.  - Vitamin D, Ergocalciferol, (DRISDOL) 1.25 MG (50000 UNIT) CAPS capsule; Take 1 capsule (50,000 Units total) by mouth every 7 (seven) days.  Dispense: 4 capsule; Refill: 0  4. Lymphedema We will look into options covered by her insurance and will follow up. [name] will continue with weight loss and exercise in the meanwhile.  5. Other depression, with emotional eating Behavior modification techniques were discussed today to help [name] deal with her emotional/non-hunger eating behaviors. [name] agreed to increased Wellbutrin SR to 200 mg q AM  with no refills, and we will follow closely. Orders and follow up as documented in patient record.   - buPROPion (WELLBUTRIN SR) 200 MG 12 hr tablet; Take 1 tablet (200 mg total) by mouth daily.  Dispense: 30 tablet; Refill: 0  6. At high risk for fluid overload Nijae was given approximately 15 minutes of fluid retention  prevention counseling today. She is 29 y.o. female and has risk factors for fluid retention including obesity. We discussed intensive lifestyle modifications today with an emphasis on specific weight loss instructions, proper nutrition and exercise strategies.   Repetitive spaced learning was employed today to elicit superior memory formation and behavioral change.  7. Class 3 severe obesity with serious comorbidity and body mass index (BMI) greater than or equal to 70 in adult, unspecified obesity type (HCC) [name] is currently in the action stage of change. As such, her goal is to get back to weightloss efforts . She has agreed to get back to strictly following the Category 3 Plan.   Behavioral modification strategies: increasing lean protein intake and emotional eating strategies.  [name] has agreed to follow-up with our clinic in 2 to 3 weeks. She was informed of the importance of frequent follow-up visits to maximize her success with intensive lifestyle modifications for her multiple health conditions.   Objective:   Blood pressure 105/70, pulse 75, temperature 98.5 F (36.9 C), height 5\' 5"  (1.651 m), weight (!) 454 lb (205.9 kg), SpO2 99 %. Body mass index is 75.55 kg/m.  General: Cooperative, alert, well developed, in no acute distress. HEENT: Conjunctivae and lids unremarkable. Cardiovascular: Regular rhythm.  Lungs: Normal work of breathing. Neurologic: No focal deficits.   Lab Results  Component Value Date   CREATININE 0.75 02/26/2020   BUN 12 02/26/2020   NA 138 02/26/2020   K 4.3 02/26/2020   CL 102 02/26/2020   CO2 29 02/26/2020   Lab Results  Component Value Date   ALT 36 (H) 02/26/2020   AST 22 02/26/2020   ALKPHOS 49 02/26/2020   BILITOT 0.4 02/26/2020   Lab Results  Component Value Date   HGBA1C 5.6 12/03/2019   HGBA1C 5.8 (H) 06/17/2019   HGBA1C 5.8 (H) 05/02/2018   HGBA1C 5.7 (H) 01/17/2018   HGBA1C 6.0 (H) 10/11/2017   Lab Results  Component  Value Date   INSULIN 30.8 (H) 12/03/2019   INSULIN 35.9 (H) 06/17/2019   INSULIN 30.3 (H) 05/02/2018   INSULIN 39.5 (H) 01/17/2018   INSULIN 73.6 (H) 10/11/2017   Lab Results  Component Value Date   TSH 1.90 02/26/2020   Lab Results  Component Value Date   CHOL 165 02/26/2020   HDL 57.50 02/26/2020   LDLCALC 87 02/26/2020   TRIG 104.0 02/26/2020   CHOLHDL 3 02/26/2020   Lab Results  Component Value Date   WBC 4.5 02/26/2020   HGB 13.5 02/26/2020   HCT 40.6 02/26/2020   MCV 84.6 02/26/2020   PLT 256.0 02/26/2020   No results found for: IRON, TIBC, FERRITIN  Attestation Statements:   Reviewed by clinician on day of visit: allergies, medications, problem list, medical history, surgical history, family history, social history, and previous encounter notes.   I, Trixie Dredge, am acting as transcriptionist for Dennard Nip, MD.  I have reviewed the above documentation for accuracy and completeness, and I agree with the above. -  Dennard Nip, MD

## 2020-05-05 DIAGNOSIS — I890 Lymphedema, not elsewhere classified: Secondary | ICD-10-CM | POA: Insufficient documentation

## 2020-05-07 DIAGNOSIS — F4320 Adjustment disorder, unspecified: Secondary | ICD-10-CM | POA: Diagnosis not present

## 2020-05-11 ENCOUNTER — Other Ambulatory Visit (INDEPENDENT_AMBULATORY_CARE_PROVIDER_SITE_OTHER): Payer: Self-pay | Admitting: Family Medicine

## 2020-05-11 DIAGNOSIS — E559 Vitamin D deficiency, unspecified: Secondary | ICD-10-CM

## 2020-05-14 DIAGNOSIS — F4320 Adjustment disorder, unspecified: Secondary | ICD-10-CM | POA: Diagnosis not present

## 2020-05-15 DIAGNOSIS — F4320 Adjustment disorder, unspecified: Secondary | ICD-10-CM | POA: Diagnosis not present

## 2020-05-21 DIAGNOSIS — F4320 Adjustment disorder, unspecified: Secondary | ICD-10-CM | POA: Diagnosis not present

## 2020-05-24 ENCOUNTER — Encounter (INDEPENDENT_AMBULATORY_CARE_PROVIDER_SITE_OTHER): Payer: Self-pay | Admitting: Family Medicine

## 2020-05-24 ENCOUNTER — Telehealth (INDEPENDENT_AMBULATORY_CARE_PROVIDER_SITE_OTHER): Payer: Self-pay

## 2020-05-24 ENCOUNTER — Ambulatory Visit (INDEPENDENT_AMBULATORY_CARE_PROVIDER_SITE_OTHER): Payer: BC Managed Care – PPO | Admitting: Family Medicine

## 2020-05-24 ENCOUNTER — Other Ambulatory Visit: Payer: Self-pay

## 2020-05-24 VITALS — BP 106/73 | HR 83 | Temp 97.9°F | Ht 65.0 in | Wt >= 6400 oz

## 2020-05-24 DIAGNOSIS — K5909 Other constipation: Secondary | ICD-10-CM

## 2020-05-24 DIAGNOSIS — Z6841 Body Mass Index (BMI) 40.0 and over, adult: Secondary | ICD-10-CM

## 2020-05-24 DIAGNOSIS — Z9189 Other specified personal risk factors, not elsewhere classified: Secondary | ICD-10-CM

## 2020-05-24 DIAGNOSIS — I890 Lymphedema, not elsewhere classified: Secondary | ICD-10-CM

## 2020-05-24 NOTE — Progress Notes (Signed)
Chief Complaint:   OBESITY Brandy Saunders is here to discuss her progress with her obesity treatment plan along with follow-up of her obesity related diagnoses. Brandy Saunders is on the Category 3 Plan and states she is following her eating plan approximately 60% of the time. Brandy Saunders states she is doing 0 minutes 0 times per week.  Today's visit was #: 30 Starting weight: 447 lbs Starting date: 10/11/2017 Today's weight: 451 lbs Today's date: 05/24/2020 Total lbs lost to date: 0 Total lbs lost since last in-office visit: 3  Interim History: Brandy Saunders has done well with weight loss. She notes her hunger has been controlled but she is now struggling to meet her protein goals. She is happy she has lost weight but disappointed that it wasn't more weight loss.  Subjective:   1. Other constipation Brandy Saunders notes decreased BM frequency in the last few weeks. She is trying to increase protein in her diet.  2. Lymphedema Brandy Saunders would like to be referred for lymphedema treatments. She denies improvement in lymphedema. She is working on weight loss.  3. At risk for heart disease Brandy Saunders is at a higher than average risk for cardiovascular disease due to obesity.   Assessment/Plan:   1. Other constipation Brandy Saunders was informed that a decrease in bowel movement frequency is normal while losing weight, but stools should not be hard or painful. Brandy Saunders is to take OTC miralax as needed and increase her water intake and fiber rich foods. Orders and follow up as documented in patient record.   2. Lymphedema Brandy Saunders has referred Brandy Saunders to Brandy Saunders for lymphedema treatments. - Ambulatory referral to Physical Therapy  3. At risk for heart disease Brandy Saunders was given approximately 15 minutes of coronary artery disease prevention counseling today. She is 29 y.o. female and has risk factors for heart disease including obesity. Brandy Saunders discussed intensive lifestyle  modifications today with an emphasis on specific weight loss instructions and strategies.   Repetitive spaced learning was employed today to elicit superior memory formation and behavioral change.  4. Class 3 severe obesity with serious comorbidity and body mass index (BMI) greater than or equal to 70 in adult, unspecified obesity type (HCC) Brandy Saunders is currently in the action stage of change. As such, her goal is to continue with weight loss efforts. She has agreed to keeping a food journal and adhering to recommended goals of 1600-2000 calories and 100+ grams of protein daily.   Realistic expectations were discussed.  Behavioral modification strategies: increasing lean protein intake, no skipping meals, meal planning and cooking strategies and better snacking choices.  Brandy Saunders has agreed to follow-up with our clinic in 2 to 3 weeks. She was informed of the importance of frequent follow-up visits to maximize her success with intensive lifestyle modifications for her multiple health conditions.   Objective:   Blood pressure 106/73, pulse 83, temperature 97.9 F (36.6 C), height 5\' 5"  (1.651 m), weight (!) 451 lb (204.6 kg), SpO2 98 %. Body mass index is 75.05 kg/m.  General: Cooperative, alert, well developed, in no acute distress. HEENT: Conjunctivae and lids unremarkable. Cardiovascular: Regular rhythm.  Lungs: Normal work of breathing. Neurologic: No focal deficits.   Lab Results  Component Value Date   CREATININE 0.75 02/26/2020   BUN 12 02/26/2020   NA 138 02/26/2020   K 4.3 02/26/2020   CL 102 02/26/2020   CO2 29 02/26/2020   Lab Results  Component Value Date   ALT 36 (H) 02/26/2020  AST 22 02/26/2020   ALKPHOS 49 02/26/2020   BILITOT 0.4 02/26/2020   Lab Results  Component Value Date   HGBA1C 5.6 12/03/2019   HGBA1C 5.8 (H) 06/17/2019   HGBA1C 5.8 (H) 05/02/2018   HGBA1C 5.7 (H) 01/17/2018   HGBA1C 6.0 (H) 10/11/2017   Lab Results  Component Value Date    INSULIN 30.8 (H) 12/03/2019   INSULIN 35.9 (H) 06/17/2019   INSULIN 30.3 (H) 05/02/2018   INSULIN 39.5 (H) 01/17/2018   INSULIN 73.6 (H) 10/11/2017   Lab Results  Component Value Date   TSH 1.90 02/26/2020   Lab Results  Component Value Date   CHOL 165 02/26/2020   HDL 57.50 02/26/2020   LDLCALC 87 02/26/2020   TRIG 104.0 02/26/2020   CHOLHDL 3 02/26/2020   Lab Results  Component Value Date   WBC 4.5 02/26/2020   HGB 13.5 02/26/2020   HCT 40.6 02/26/2020   MCV 84.6 02/26/2020   PLT 256.0 02/26/2020   No results found for: IRON, TIBC, FERRITIN  Attestation Statements:   Reviewed by clinician on day of visit: allergies, medications, problem list, medical history, surgical history, family history, social history, and previous encounter notes.   I, Trixie Dredge, am acting as transcriptionist for Dennard Nip, MD.  I have reviewed the above documentation for accuracy and completeness, and I agree with the above. -  Dennard Nip, MD

## 2020-05-24 NOTE — Telephone Encounter (Signed)
See my chart message

## 2020-06-04 DIAGNOSIS — F4320 Adjustment disorder, unspecified: Secondary | ICD-10-CM | POA: Diagnosis not present

## 2020-06-07 ENCOUNTER — Other Ambulatory Visit (INDEPENDENT_AMBULATORY_CARE_PROVIDER_SITE_OTHER): Payer: Self-pay | Admitting: Family Medicine

## 2020-06-07 DIAGNOSIS — E559 Vitamin D deficiency, unspecified: Secondary | ICD-10-CM

## 2020-06-08 ENCOUNTER — Encounter (INDEPENDENT_AMBULATORY_CARE_PROVIDER_SITE_OTHER): Payer: Self-pay | Admitting: Family Medicine

## 2020-06-08 ENCOUNTER — Other Ambulatory Visit: Payer: Self-pay

## 2020-06-08 ENCOUNTER — Ambulatory Visit (INDEPENDENT_AMBULATORY_CARE_PROVIDER_SITE_OTHER): Payer: BC Managed Care – PPO | Admitting: Family Medicine

## 2020-06-08 VITALS — BP 109/74 | HR 76 | Temp 98.1°F | Ht 65.0 in | Wt >= 6400 oz

## 2020-06-08 DIAGNOSIS — F3289 Other specified depressive episodes: Secondary | ICD-10-CM | POA: Diagnosis not present

## 2020-06-08 DIAGNOSIS — R7303 Prediabetes: Secondary | ICD-10-CM | POA: Diagnosis not present

## 2020-06-08 DIAGNOSIS — Z9189 Other specified personal risk factors, not elsewhere classified: Secondary | ICD-10-CM | POA: Diagnosis not present

## 2020-06-08 DIAGNOSIS — I10 Essential (primary) hypertension: Secondary | ICD-10-CM

## 2020-06-08 DIAGNOSIS — E66813 Obesity, class 3: Secondary | ICD-10-CM

## 2020-06-08 DIAGNOSIS — Z6841 Body Mass Index (BMI) 40.0 and over, adult: Secondary | ICD-10-CM

## 2020-06-08 DIAGNOSIS — E559 Vitamin D deficiency, unspecified: Secondary | ICD-10-CM

## 2020-06-08 MED ORDER — METOPROLOL SUCCINATE ER 100 MG PO TB24: 100.0000 mg | ORAL_TABLET | Freq: Every day | ORAL | 0 refills | Status: DC

## 2020-06-08 MED ORDER — RYBELSUS 7 MG PO TABS: 1.0000 | ORAL_TABLET | Freq: Every morning | ORAL | 0 refills | Status: DC

## 2020-06-08 MED ORDER — BUPROPION HCL ER (SR) 200 MG PO TB12: 200.0000 mg | ORAL_TABLET | Freq: Every day | ORAL | 0 refills | Status: DC

## 2020-06-08 MED ORDER — VITAMIN D (ERGOCALCIFEROL) 1.25 MG (50000 UNIT) PO CAPS: 50000.0000 [IU] | ORAL_CAPSULE | ORAL | 0 refills | Status: DC

## 2020-06-10 NOTE — Progress Notes (Signed)
Chief Complaint:   OBESITY Brandy Saunders is here to discuss her progress with her obesity treatment plan along with follow-up of her obesity related diagnoses. Brandy Saunders is on keeping a food journal and adhering to recommended goals of 1600-2000 calories and 100 grams of protein and states Brandy Saunders is following her eating plan approximately 75% of the time. Brandy Saunders states Brandy Saunders is walking the dog for 15-20 minutes 5 times per week.  Today's visit was #: 44 Starting weight: 447 lbs Starting date: 10/11/2017 Today's weight: 451 lbs Today's date: 06/08/2020 Total lbs lost to date: 0 Total lbs lost since last in-office visit: 0  Interim History: Brandy Saunders is working with her therapist to get into her relationship with food.  Brandy Saunders realized most of her weight loss journey is rooted in mental health.  Brandy Saunders finds herself sometimes struggling to eat food on plan and get enough calories.  Subjective:   1. Prediabetes [name] has a diagnosis of prediabetes based on her elevated HgA1c and was informed this puts her at greater risk of developing diabetes. Brandy Saunders continues to work on diet and exercise to decrease her risk of diabetes. Brandy Saunders denies nausea or hypoglycemia.  Brandy Saunders is taking Rybelsus 3 mg daily with some good control of carb cravings and food intake.  Lab Results  Component Value Date   HGBA1C 5.6 12/03/2019   Lab Results  Component Value Date   INSULIN 30.8 (H) 12/03/2019   INSULIN 35.9 (H) 06/17/2019   INSULIN 30.3 (H) 05/02/2018   INSULIN 39.5 (H) 01/17/2018   INSULIN 73.6 (H) 10/11/2017   2. Essential hypertension Review: taking medications as instructed, no medication side effects noted, no chest pain on exertion, headache, no dyspnea on exertion, no swelling of ankles.  Blood pressure is controlled today.    BP Readings from Last 3 Encounters:  06/08/20 109/74  05/24/20 106/73  05/04/20 105/70   3. Vitamin D deficiency Murphy's Vitamin D level was 34.27 on 02/26/2020. Brandy Saunders is currently  taking prescription vitamin D 50,000 IU each week. Brandy Saunders denies nausea, vomiting or muscle weakness.  Brandy Saunders endorses fatigue.  4. Other depression, with emotional eating Brandy Saunders is taking bupropion 200 mg with some improvement in craving control.  Denies SI/HI.  Denies hypertension.  5. At risk for deficient intake of food The patient is at a higher than average risk of deficient intake of food due to sometimes not eating enough.  Assessment/Plan:   1. Prediabetes Brandy Saunders will continue to work on weight loss, exercise, and decreasing simple carbohydrates to help decrease the risk of diabetes.  Will increase Rybelsus, as per below.  -Increase Semaglutide (RYBELSUS) 7 MG TABS; Take 1 tablet by mouth in the morning.  Dispense: 30 tablet; Refill: 0  2. Essential hypertension Brandy Saunders is working on healthy weight loss and exercise to improve blood pressure control. We will watch for signs of hypotension as Brandy Saunders continues her lifestyle modifications.    -Refill metoprolol succinate (TOPROL-XL) 100 MG 24 hr tablet; Take 1 tablet (100 mg total) by mouth daily. Take with or immediately following a meal.  Dispense: 30 tablet; Refill: 0  3. Vitamin D deficiency Low Vitamin D level contributes to fatigue and are associated with obesity, breast, and colon cancer. Brandy Saunders agrees to continue to take prescription Vitamin D @50 ,000 IU every week and will follow-up for routine testing of Vitamin D, at least 2-3 times per year to avoid over-replacement.  -Refill Vitamin D, Ergocalciferol, (DRISDOL) 1.25 MG (50000 UNIT) CAPS capsule; Take 1 capsule (  50,000 Units total) by mouth every 7 (seven) days.  Dispense: 4 capsule; Refill: 0  4. Other depression, with emotional eating Behavior modification techniques were discussed today to help Kearra deal with her emotional/non-hunger eating behaviors.  Orders and follow up as documented in patient record.   -Refill buPROPion (WELLBUTRIN SR) 200 MG 12 hr tablet; Take 1 tablet  (200 mg total) by mouth daily.  Dispense: 30 tablet; Refill: 0  5. At risk for deficient intake of food Brandy Saunders was given approximately 15 minutes of deficit intake of food prevention counseling today. Brandy Saunders is at risk for eating too few calories based on current food recall. Brandy Saunders was encouraged to focus on meeting caloric and protein goals according to her recommended meal plan.   6. Class 3 severe obesity with serious comorbidity and body mass index (BMI) greater than or equal to 70 in adult, unspecified obesity type (HCC)  Brandy Saunders is currently in the action stage of change. As such, her goal is to continue with weight loss efforts. Brandy Saunders has agreed to keeping a food journal and adhering to recommended goals of 1800-2000 calories and 125+ grams of protein.   Exercise goals: All adults should avoid inactivity. Some physical activity is better than none, and adults who participate in any amount of physical activity gain some health benefits.  Behavioral modification strategies: increasing lean protein intake, increasing vegetables, meal planning and cooking strategies, keeping healthy foods in the home and planning for success.  Brandy Saunders has agreed to follow-up with our clinic in 2-3 weeks. Brandy Saunders was informed of the importance of frequent follow-up visits to maximize her success with intensive lifestyle modifications for her multiple health conditions.   Objective:   Blood pressure 109/74, pulse 76, temperature 98.1 F (36.7 C), temperature source Oral, height 5\' 5"  (1.651 m), weight (!) 451 lb (204.6 kg), last menstrual period 05/18/2020, SpO2 98 %. Body mass index is 75.05 kg/m.  General: Cooperative, alert, well developed, in no acute distress. HEENT: Conjunctivae and lids unremarkable. Cardiovascular: Regular rhythm.  Lungs: Normal work of breathing. Neurologic: No focal deficits.   Lab Results  Component Value Date   CREATININE 0.75 02/26/2020   BUN 12 02/26/2020   NA 138  02/26/2020   K 4.3 02/26/2020   CL 102 02/26/2020   CO2 29 02/26/2020   Lab Results  Component Value Date   ALT 36 (H) 02/26/2020   AST 22 02/26/2020   ALKPHOS 49 02/26/2020   BILITOT 0.4 02/26/2020   Lab Results  Component Value Date   HGBA1C 5.6 12/03/2019   HGBA1C 5.8 (H) 06/17/2019   HGBA1C 5.8 (H) 05/02/2018   HGBA1C 5.7 (H) 01/17/2018   HGBA1C 6.0 (H) 10/11/2017   Lab Results  Component Value Date   INSULIN 30.8 (H) 12/03/2019   INSULIN 35.9 (H) 06/17/2019   INSULIN 30.3 (H) 05/02/2018   INSULIN 39.5 (H) 01/17/2018   INSULIN 73.6 (H) 10/11/2017   Lab Results  Component Value Date   TSH 1.90 02/26/2020   Lab Results  Component Value Date   CHOL 165 02/26/2020   HDL 57.50 02/26/2020   LDLCALC 87 02/26/2020   TRIG 104.0 02/26/2020   CHOLHDL 3 02/26/2020   Lab Results  Component Value Date   WBC 4.5 02/26/2020   HGB 13.5 02/26/2020   HCT 40.6 02/26/2020   MCV 84.6 02/26/2020   PLT 256.0 02/26/2020   Attestation Statements:   Reviewed by clinician on day of visit: allergies, medications, problem list, medical history, surgical history,  family history, social history, and previous encounter notes.  I, Water quality scientist, CMA, am acting as transcriptionist for Coralie Common, MD.  I have reviewed the above documentation for accuracy and completeness, and I agree with the above. - Jinny Blossom, MD

## 2020-06-12 DIAGNOSIS — F4320 Adjustment disorder, unspecified: Secondary | ICD-10-CM | POA: Diagnosis not present

## 2020-06-15 ENCOUNTER — Encounter (INDEPENDENT_AMBULATORY_CARE_PROVIDER_SITE_OTHER): Payer: Self-pay

## 2020-06-18 DIAGNOSIS — F4320 Adjustment disorder, unspecified: Secondary | ICD-10-CM | POA: Diagnosis not present

## 2020-06-23 ENCOUNTER — Encounter (INDEPENDENT_AMBULATORY_CARE_PROVIDER_SITE_OTHER): Payer: Self-pay | Admitting: Family Medicine

## 2020-06-23 ENCOUNTER — Ambulatory Visit (INDEPENDENT_AMBULATORY_CARE_PROVIDER_SITE_OTHER): Payer: BC Managed Care – PPO | Admitting: Family Medicine

## 2020-06-23 ENCOUNTER — Other Ambulatory Visit: Payer: Self-pay

## 2020-06-23 VITALS — BP 105/70 | HR 72 | Temp 97.6°F | Ht 65.0 in | Wt >= 6400 oz

## 2020-06-23 DIAGNOSIS — R7303 Prediabetes: Secondary | ICD-10-CM

## 2020-06-23 DIAGNOSIS — Z6841 Body Mass Index (BMI) 40.0 and over, adult: Secondary | ICD-10-CM

## 2020-06-23 DIAGNOSIS — K5909 Other constipation: Secondary | ICD-10-CM | POA: Diagnosis not present

## 2020-06-23 DIAGNOSIS — Z9189 Other specified personal risk factors, not elsewhere classified: Secondary | ICD-10-CM

## 2020-06-23 MED ORDER — RYBELSUS 3 MG PO TABS: 1.0000 | ORAL_TABLET | Freq: Every day | ORAL | 0 refills | Status: DC

## 2020-06-24 ENCOUNTER — Telehealth (INDEPENDENT_AMBULATORY_CARE_PROVIDER_SITE_OTHER): Payer: Self-pay

## 2020-06-24 NOTE — Telephone Encounter (Signed)
Contacted pt's insurance company to find out why Rybelsus 7mg  tabs were denied when 3mg  tabs were previously approved.  Alecia A. PA rep for [employer] indicated that Rybelsus 7mg  tabs are only approved for a diagnosis of Diabetes Mellitis type 2. Inquired about why 3mg  dose was previously approved without this dx, Brandy Saunders was unable to provide information that answers this question.   Please advise as to how you would like to proceed.

## 2020-06-24 NOTE — Progress Notes (Signed)
Chief Complaint:   OBESITY Brandy Saunders is here to discuss her progress with her obesity treatment plan along with follow-up of her obesity related diagnoses. Brandy Saunders is on keeping a food journal and adhering to recommended goals of 1800-2000 calories and 1258+ grams of protein daily and states she is following her eating plan approximately 90% of the time. Brandy Saunders states she is walking the dog for 15-30 minutes 5 times per week.  Today's visit was #: 61 Starting weight: 447 lbs Starting date: 10/11/2017 Today's weight: 448 lbs Today's date: 06/23/2020 Total lbs lost to date: 0 Total lbs lost since last in-office visit: 3  Interim History: Brandy Saunders continues to do well with weight loss and with journaling, and she feels she is doing well with meeting both calorie and protein. She is working a second job and has increased hr daily steps.  Subjective:   1. Pre-diabetes Brandy Saunders is on Rybelsus but is having problems increasing dose with insurance. She continues to do well with diet and weight loss.  2. Other constipation Brandy Saunders notes decreased BM frequency, but BM is not hard or painful. She has taken miralax previously and done well with it.  3. At risk for diarrhea Brandy Saunders is at higher risk of diarrhea due to increased miralax dose.  Assessment/Plan:   1. Pre-diabetes Brandy Saunders will continue to work on weight loss, exercise, and decreasing simple carbohydrates to help decrease the risk of diabetes. We will refill Rybelsus for 1 month.  - Semaglutide (RYBELSUS) 3 MG TABS; Take 1 tablet by mouth daily.  Dispense: 30 tablet; Refill: 0  2. Other constipation Brandy Saunders was informed that a decrease in bowel movement frequency is normal while losing weight, but stools should not be hard or painful. Brandy Saunders agreed to start miralax 17 grams as needed, and increase water. Will continue to monitor. Orders and follow up as documented in patient record.   3. At risk for  diarrhea Brandy Saunders was given approximately 15 minutes of diarrhea prevention counseling today. She is 29 y.o. female and has risk factors for diarrhea including medications and changes in diet. We discussed intensive lifestyle modifications today with an emphasis on specific weight loss instructions including dietary strategies.   Repetitive spaced learning was employed today to elicit superior memory formation and behavioral change.  4. Class 3 severe obesity with serious comorbidity and body mass index (BMI) greater than or equal to 70 in adult, unspecified obesity type (HCC) Brandy Saunders is currently in the action stage of change. As such, her goal is to continue with weight loss efforts. She has agreed to keeping a food journal and adhering to recommended goals of 1800-2000 calories and 125+ grams of protein daily.   Exercise goals: As is.  Behavioral modification strategies: meal planning and cooking strategies.  Brandy Saunders has agreed to follow-up with our clinic in 2 to 3 weeks. She was informed of the importance of frequent follow-up visits to maximize her success with intensive lifestyle modifications for her multiple health conditions.   Objective:   Blood pressure 105/70, pulse 72, temperature 97.6 F (36.4 C), height 5\' 5"  (1.651 m), weight (!) 448 lb (203.2 kg), SpO2 100 %. Body mass index is 74.55 kg/m.  General: Cooperative, alert, well developed, in no acute distress. HEENT: Conjunctivae and lids unremarkable. Cardiovascular: Regular rhythm.  Lungs: Normal work of breathing. Neurologic: No focal deficits.   Lab Results  Component Value Date   CREATININE 0.75 02/26/2020   BUN 12 02/26/2020   NA 138 02/26/2020  K 4.3 02/26/2020   CL 102 02/26/2020   CO2 29 02/26/2020   Lab Results  Component Value Date   ALT 36 (H) 02/26/2020   AST 22 02/26/2020   ALKPHOS 49 02/26/2020   BILITOT 0.4 02/26/2020   Lab Results  Component Value Date   HGBA1C 5.6 12/03/2019   HGBA1C  5.8 (H) 06/17/2019   HGBA1C 5.8 (H) 05/02/2018   HGBA1C 5.7 (H) 01/17/2018   HGBA1C 6.0 (H) 10/11/2017   Lab Results  Component Value Date   INSULIN 30.8 (H) 12/03/2019   INSULIN 35.9 (H) 06/17/2019   INSULIN 30.3 (H) 05/02/2018   INSULIN 39.5 (H) 01/17/2018   INSULIN 73.6 (H) 10/11/2017   Lab Results  Component Value Date   TSH 1.90 02/26/2020   Lab Results  Component Value Date   CHOL 165 02/26/2020   HDL 57.50 02/26/2020   LDLCALC 87 02/26/2020   TRIG 104.0 02/26/2020   CHOLHDL 3 02/26/2020   Lab Results  Component Value Date   WBC 4.5 02/26/2020   HGB 13.5 02/26/2020   HCT 40.6 02/26/2020   MCV 84.6 02/26/2020   PLT 256.0 02/26/2020   No results found for: IRON, TIBC, FERRITIN  Attestation Statements:   Reviewed by clinician on day of visit: allergies, medications, problem list, medical history, surgical history, family history, social history, and previous encounter notes.   I, Trixie Dredge, am acting as transcriptionist for Dennard Nip, MD.  I have reviewed the above documentation for accuracy and completeness, and I agree with the above. -  Dennard Nip, MD

## 2020-07-10 DIAGNOSIS — F4320 Adjustment disorder, unspecified: Secondary | ICD-10-CM | POA: Diagnosis not present

## 2020-07-11 ENCOUNTER — Other Ambulatory Visit (INDEPENDENT_AMBULATORY_CARE_PROVIDER_SITE_OTHER): Payer: Self-pay | Admitting: Family Medicine

## 2020-07-11 DIAGNOSIS — I10 Essential (primary) hypertension: Secondary | ICD-10-CM

## 2020-07-11 DIAGNOSIS — F3289 Other specified depressive episodes: Secondary | ICD-10-CM

## 2020-07-12 NOTE — Telephone Encounter (Signed)
Last seen by Dr Beasley 

## 2020-07-14 ENCOUNTER — Encounter (INDEPENDENT_AMBULATORY_CARE_PROVIDER_SITE_OTHER): Payer: Self-pay | Admitting: Family Medicine

## 2020-07-14 ENCOUNTER — Other Ambulatory Visit: Payer: Self-pay

## 2020-07-14 ENCOUNTER — Ambulatory Visit (INDEPENDENT_AMBULATORY_CARE_PROVIDER_SITE_OTHER): Payer: BC Managed Care – PPO | Admitting: Family Medicine

## 2020-07-14 VITALS — BP 107/73 | HR 71 | Temp 97.5°F | Ht 65.0 in | Wt >= 6400 oz

## 2020-07-14 DIAGNOSIS — I10 Essential (primary) hypertension: Secondary | ICD-10-CM

## 2020-07-14 DIAGNOSIS — F3289 Other specified depressive episodes: Secondary | ICD-10-CM | POA: Diagnosis not present

## 2020-07-14 DIAGNOSIS — Z9189 Other specified personal risk factors, not elsewhere classified: Secondary | ICD-10-CM | POA: Diagnosis not present

## 2020-07-14 DIAGNOSIS — E559 Vitamin D deficiency, unspecified: Secondary | ICD-10-CM

## 2020-07-14 DIAGNOSIS — Z6841 Body Mass Index (BMI) 40.0 and over, adult: Secondary | ICD-10-CM

## 2020-07-14 MED ORDER — VITAMIN D (ERGOCALCIFEROL) 1.25 MG (50000 UNIT) PO CAPS: 50000.0000 [IU] | ORAL_CAPSULE | ORAL | 0 refills | Status: DC

## 2020-07-14 MED ORDER — SAXENDA 18 MG/3ML ~~LOC~~ SOPN: 3.0000 mg | PEN_INJECTOR | Freq: Every day | SUBCUTANEOUS | 0 refills | Status: DC

## 2020-07-14 MED ORDER — INSULIN PEN NEEDLE 31G X 5 MM MISC: 0 refills | Status: DC

## 2020-07-14 MED ORDER — METOPROLOL SUCCINATE ER 100 MG PO TB24: 100.0000 mg | ORAL_TABLET | Freq: Every day | ORAL | 0 refills | Status: DC

## 2020-07-14 MED ORDER — BUPROPION HCL ER (SR) 200 MG PO TB12: 200.0000 mg | ORAL_TABLET | Freq: Every day | ORAL | 0 refills | Status: DC

## 2020-07-15 NOTE — Progress Notes (Signed)
Chief Complaint:   OBESITY Brandy Saunders is here to discuss her progress with her obesity treatment plan along with follow-up of her obesity related diagnoses. Brandy Saunders is on keeping a food journal and adhering to recommended goals of 1800-2000 calories and 125+ grams of protein daily and states she is following her eating plan approximately 75% of the time. Brandy Saunders states she is walking at work and walking the dog for 60 minutes 4 times per week.  Today's visit was #: 59 Starting weight: 447 lbs Starting date: 10/11/2017 Today's weight: 447 lbs Today's date: 07/14/2020 Total lbs lost to date: 0 Total lbs lost since last in-office visit: 1  Interim History: Brandy Saunders has been working on Research [employer], trade [employer] and meeting her protein goals. She is now happily engaged and she is looking forward to her future. She is open to discussing weight loss medications but she is nervous about starting an injection medication.  Subjective:   1. Vitamin D deficiency Brandy Saunders is stable on Vit D, and she requests a refill today.  2. Essential hypertension Brandy Saunders's blood pressure is well controlled on her medications. She is working on weight loss.  3. Other depression, with emotional eating Brandy Saunders is tolerating Wellbutrin and she continues to work on decreasing emotional eating behaviors. She requests a refill today.  4. At risk for nausea Brandy Saunders is at risk for nausea due to new medication.  Assessment/Plan:   1. Vitamin D deficiency Low Vitamin D level contributes to fatigue and are associated with obesity, breast, and colon cancer. We will refill prescription Vitamin D for 1 month. Brandy Saunders will follow-up for routine testing of Vitamin D, at least 2-3 times per year to avoid over-replacement.  - Vitamin D, Ergocalciferol, (DRISDOL) 1.25 MG (50000 UNIT) CAPS capsule; Take 1 capsule (50,000 Units total) by mouth every 7 (seven) days.  Dispense: 4 capsule; Refill: 0  2. Essential  hypertension Brandy Saunders is working on healthy weight loss and exercise to improve blood pressure control. We will watch for signs of hypotension as she continues her lifestyle modifications. We will refill Toprol-XL for 1 month.  - metoprolol succinate (TOPROL-XL) 100 MG 24 hr tablet; Take 1 tablet (100 mg total) by mouth daily. Take with or immediately following a meal.  Dispense: 30 tablet; Refill: 0  3. Other depression, with emotional eating Behavior modification techniques were discussed today to help Brandy Saunders deal with her emotional/non-hunger eating behaviors. We will refill Wellbutrin SR Orders and follow up as documented in patient record.   - buPROPion (WELLBUTRIN SR) 200 MG 12 hr tablet; Take 1 tablet (200 mg total) by mouth daily.  Dispense: 30 tablet; Refill: 0  4. At risk for nausea Brandy Saunders was given approximately 15 minutes of nausea prevention counseling today. Brandy Saunders is at risk for nausea due to her new or current medication. She was encouraged to titrate her medication slowly, make sure to stay hydrated, eat smaller portions throughout the day, and avoid high fat meals.   5. Class 3 severe obesity with serious comorbidity and body mass index (BMI) greater than or equal to 70 in adult, unspecified obesity type (HCC) Brandy Saunders is currently in the action stage of change. As such, her goal is to continue with weight loss efforts. She has agreed to keeping a food journal and adhering to recommended goals of 1800 calories and 125+ grams of protein daily.   We discussed various medication options to help Brandy Saunders with her weight loss efforts and we both agreed to start Saxenda  at 3.0 mg with no refills (she will start at 0.6 mg q AM). Pen needles #100 with no refills. Brandy Saunders was educated on how to give the injoection and she was reassured that is was very easy to do.  - Liraglutide -Weight Management (SAXENDA) 18 MG/3ML SOPN; Inject 3 mg into the skin daily.  Dispense: 15  mL; Refill: 0 - Insulin Pen Needle 31G X 5 MM MISC; Use one pen needle once daily to inject Saxenda.  Dispense: 100 each; Refill: 0  Exercise goals: As is.  Behavioral modification strategies: holiday eating strategies .  Brandy Saunders has agreed to follow-up with our clinic in 3 to 4 weeks. She was informed of the importance of frequent follow-up visits to maximize her success with intensive lifestyle modifications for her multiple health conditions.   Objective:   Blood pressure 107/73, pulse 71, temperature (!) 97.5 F (36.4 C), height 5\' 5"  (1.651 m), weight (!) 447 lb (202.8 kg), SpO2 95 %. Body mass index is 74.38 kg/m.  General: Cooperative, alert, well developed, in no acute distress. HEENT: Conjunctivae and lids unremarkable. Cardiovascular: Regular rhythm.  Lungs: Normal work of breathing. Neurologic: No focal deficits.   Lab Results  Component Value Date   CREATININE 0.75 02/26/2020   BUN 12 02/26/2020   NA 138 02/26/2020   K 4.3 02/26/2020   CL 102 02/26/2020   CO2 29 02/26/2020   Lab Results  Component Value Date   ALT 36 (H) 02/26/2020   AST 22 02/26/2020   ALKPHOS 49 02/26/2020   BILITOT 0.4 02/26/2020   Lab Results  Component Value Date   HGBA1C 5.6 12/03/2019   HGBA1C 5.8 (H) 06/17/2019   HGBA1C 5.8 (H) 05/02/2018   HGBA1C 5.7 (H) 01/17/2018   HGBA1C 6.0 (H) 10/11/2017   Lab Results  Component Value Date   INSULIN 30.8 (H) 12/03/2019   INSULIN 35.9 (H) 06/17/2019   INSULIN 30.3 (H) 05/02/2018   INSULIN 39.5 (H) 01/17/2018   INSULIN 73.6 (H) 10/11/2017   Lab Results  Component Value Date   TSH 1.90 02/26/2020   Lab Results  Component Value Date   CHOL 165 02/26/2020   HDL 57.50 02/26/2020   LDLCALC 87 02/26/2020   TRIG 104.0 02/26/2020   CHOLHDL 3 02/26/2020   Lab Results  Component Value Date   WBC 4.5 02/26/2020   HGB 13.5 02/26/2020   HCT 40.6 02/26/2020   MCV 84.6 02/26/2020   PLT 256.0 02/26/2020   No results found for: IRON,  TIBC, FERRITIN  Attestation Statements:   Reviewed by clinician on day of visit: allergies, medications, problem list, medical history, surgical history, family history, social history, and previous encounter notes.   I, Trixie Dredge, am acting as transcriptionist for Dennard Nip, MD.  I have reviewed the above documentation for accuracy and completeness, and I agree with the above. -  Dennard Nip, MD

## 2020-07-16 DIAGNOSIS — F4320 Adjustment disorder, unspecified: Secondary | ICD-10-CM | POA: Diagnosis not present

## 2020-07-18 ENCOUNTER — Other Ambulatory Visit: Payer: Self-pay

## 2020-07-18 ENCOUNTER — Emergency Department (HOSPITAL_BASED_OUTPATIENT_CLINIC_OR_DEPARTMENT_OTHER)
Admission: EM | Admit: 2020-07-18 | Discharge: 2020-07-18 | Disposition: A | Discharge status: Home / Self Care | Payer: BC Managed Care – PPO | Attending: Emergency Medicine | Admitting: Emergency Medicine

## 2020-07-18 ENCOUNTER — Encounter (HOSPITAL_BASED_OUTPATIENT_CLINIC_OR_DEPARTMENT_OTHER): Payer: Self-pay | Admitting: *Deleted

## 2020-07-18 ENCOUNTER — Emergency Department (HOSPITAL_BASED_OUTPATIENT_CLINIC_OR_DEPARTMENT_OTHER): Payer: BC Managed Care – PPO

## 2020-07-18 DIAGNOSIS — R0602 Shortness of breath: Secondary | ICD-10-CM

## 2020-07-18 DIAGNOSIS — J45901 Unspecified asthma with (acute) exacerbation: Secondary | ICD-10-CM | POA: Insufficient documentation

## 2020-07-18 DIAGNOSIS — Z794 Long term (current) use of insulin: Secondary | ICD-10-CM | POA: Insufficient documentation

## 2020-07-18 DIAGNOSIS — I10 Essential (primary) hypertension: Secondary | ICD-10-CM | POA: Insufficient documentation

## 2020-07-18 DIAGNOSIS — Z7984 Long term (current) use of oral hypoglycemic drugs: Secondary | ICD-10-CM | POA: Insufficient documentation

## 2020-07-18 DIAGNOSIS — Z79899 Other long term (current) drug therapy: Secondary | ICD-10-CM | POA: Insufficient documentation

## 2020-07-18 DIAGNOSIS — Z87891 Personal history of nicotine dependence: Secondary | ICD-10-CM | POA: Insufficient documentation

## 2020-07-18 LAB — COMPREHENSIVE METABOLIC PANEL
ALT: 38 U/L (ref 0–44)
AST: 26 U/L (ref 15–41)
Albumin: 4.2 g/dL (ref 3.5–5.0)
Alkaline Phosphatase: 48 U/L (ref 38–126)
Anion gap: 9 (ref 5–15)
BUN: 13 mg/dL (ref 6–20)
CO2: 24 mmol/L (ref 22–32)
Calcium: 8.9 mg/dL (ref 8.9–10.3)
Chloride: 104 mmol/L (ref 98–111)
Creatinine, Ser: 0.9 mg/dL (ref 0.44–1.00)
GFR, Estimated: >60 mL/min (ref >60)
Glucose, Bld: 112 mg/dL — ABNORMAL HIGH (ref 70–99)
Potassium: 3.9 mmol/L (ref 3.5–5.1)
Sodium: 137 mmol/L (ref 135–145)
Total Bilirubin: 0.7 mg/dL (ref 0.3–1.2)
Total Protein: 7.5 g/dL (ref 6.5–8.1)

## 2020-07-18 LAB — CBC WITH DIFFERENTIAL/PLATELET
Abs Immature Granulocytes: 0.02 10*3/uL (ref 0.00–0.07)
Basophils Absolute: 0 10*3/uL (ref 0.0–0.1)
Basophils Relative: 0 %
Eosinophils Absolute: 0.1 10*3/uL (ref 0.0–0.5)
Eosinophils Relative: 2 %
HCT: 42.5 % (ref 36.0–46.0)
Hemoglobin: 13.8 g/dL (ref 12.0–15.0)
Immature Granulocytes: 0 %
Lymphocytes Relative: 55 %
Lymphs Abs: 3 10*3/uL (ref 0.7–4.0)
MCH: 28 pg (ref 26.0–34.0)
MCHC: 32.5 g/dL (ref 30.0–36.0)
MCV: 86.4 fL (ref 80.0–100.0)
Monocytes Absolute: 0.3 10*3/uL (ref 0.1–1.0)
Monocytes Relative: 5 %
Neutro Abs: 2.1 10*3/uL (ref 1.7–7.7)
Neutrophils Relative %: 38 %
Platelets: 234 10*3/uL (ref 150–400)
RBC: 4.92 MIL/uL (ref 3.87–5.11)
RDW: 12.6 % (ref 11.5–15.5)
WBC: 5.6 10*3/uL (ref 4.0–10.5)
nRBC: 0 % (ref 0.0–0.2)

## 2020-07-18 LAB — BRAIN NATRIURETIC PEPTIDE: B Natriuretic Peptide: 34.4 pg/mL (ref 0.0–100.0)

## 2020-07-18 LAB — D-DIMER, QUANTITATIVE: D-Dimer, Quant: <0.27 ug/mL-FEU (ref 0.00–0.50)

## 2020-07-18 LAB — TROPONIN I (HIGH SENSITIVITY): Troponin I (High Sensitivity): <2 ng/L (ref <18)

## 2020-07-18 MED ORDER — ALBUTEROL SULFATE HFA 108 (90 BASE) MCG/ACT IN AERS
2.0000 | INHALATION_SPRAY | Freq: Once | RESPIRATORY_TRACT | Status: AC
  Administered 2020-07-18: 2 via RESPIRATORY_TRACT
  Filled 2020-07-18: qty 6.7

## 2020-07-18 NOTE — ED Notes (Signed)
Attempted PIV twice, unsuccessful; pt tolerated well

## 2020-07-18 NOTE — ED Provider Notes (Signed)
[facility]Y DEPARTMENT Provider Note   CSN: [id number] Arrival date & time: 07/18/20  2049     History Chief Complaint  Patient presents with  . Shortness of Breath    Brandy Saunders is a 29 y.o. female history of, asthma who presented with shortness of breath.  Patient states that for the last 4 days she felt like she cannot take a deep breath.  She has subjective shortness of breath only when she takes a deep breath.  Patient is fully vaccinated for Covid.  Patient denies any cough or fever or Covid exposure. Patient denies any history of blood clots.  She states that her legs are more swollen than usual.  No history of heart failure.  The history is provided by the patient.       Past Medical History:  Diagnosis Date  . Alcohol abuse   . Anxiety   . Asthma   . Back pain   . Binge eating   . Carpal tunnel syndrome, bilateral   . Chest pain   . Common migraine with intractable migraine 02/02/2017  . Constipation   . Depression   . Dyspnea   . Fatty liver   . GERD (gastroesophageal reflux disease)   . Hypertension   . Joint pain   . Leg edema   . Morbidly obese ([place])   . Palpitations   . PCOS (polycystic ovarian syndrome)   . Prediabetes   . Sleep apnea   . Vaginal Pap smear, abnormal   . Vitamin D deficiency     Patient Active Problem List   Diagnosis Date Noted  . Lymphedema 05/05/2020  . Vitamin D deficiency 01/22/2020  . Rash 12/16/2019  . Nevus 12/16/2019  . Depression 10/19/2019  . Hyperlipidemia 09/18/2019  . Spasm of thoracic back muscle 09/18/2019  . Palpitations 08/30/2018  . Obstructive sleep apnea 08/30/2018  . Pain of left calf 12/14/2017  . Other fatigue 10/11/2017  . Shortness of breath on exertion 10/11/2017  . Prediabetes 10/11/2017  . Chronic left-sided thoracic back pain 05/23/2017  . Acute left-sided thoracic back pain 05/09/2017  . Migraine 05/09/2017  . Essential hypertension 05/09/2017  . Common migraine  with intractable migraine 02/02/2017  . Chronic fatigue 01/06/2016  . Fatty liver 02/15/2015  . Esophageal reflux 02/15/2015  . Late menses 07/30/2014  . Class 3 severe obesity with serious comorbidity and body mass index (BMI) greater than or equal to 70 in adult [facility]) 07/30/2014  . Asthma without acute exacerbation 07/30/2014  . IBS (irritable bowel syndrome) 07/30/2014  . Elevated BP 07/30/2014  . PCOS (polycystic ovarian syndrome) 11/25/2012  . Carpal tunnel syndrome, bilateral     Past Surgical History:  Procedure Laterality Date  . TONSILECTOMY, ADENOIDECTOMY, BILATERAL MYRINGOTOMY AND TUBES  1994     OB History    Gravida  0   Para  0   Term  0   Preterm  0   AB  0   Living  0     SAB  0   TAB  0   Ectopic  0   Multiple  0   Live Births  0           Family History  Problem Relation Age of Onset  . Hypertension Mother   . Hyperlipidemia Mother   . Obesity Mother   . Heart disease Mother   . Stroke Father   . Obesity Father   . Alcoholism Father   . Drug  abuse Father   . Sudden death Father   . Diabetes Maternal Uncle   . Heart disease Maternal Grandmother   . Colon cancer Neg Hx     Social History   Tobacco Use  . Smoking status: Former Smoker    Types: Cigarettes  . Smokeless tobacco: Never Used  Vaping Use  . Vaping Use: Former  Substance Use Topics  . Alcohol use: Yes    Alcohol/week: 3.0 standard drinks    Types: 3 Glasses of wine per week  . Drug use: No    Types: Marijuana    Comment: once every 6 months, taking CBD    Home Medications Prior to Admission medications   Medication Sig Start Date End Date Taking? Authorizing Provider  acetaminophen (TYLENOL) 500 MG tablet Take 500 mg by mouth every 6 (six) hours as needed.    [provider]  Ascorbic Acid (VITAMIN C) 1000 MG tablet Take 1,000 mg by mouth daily.    [provider]  BIOTIN 5000 PO Take 10,000 mg by mouth daily.     [provider]   buPROPion (WELLBUTRIN SR) 200 MG 12 hr tablet Take 1 tablet (200 mg total) by mouth daily. 07/14/20   Dennard Nip D, MD  Cholecalciferol (VITAMIN D) 50 MCG (2000 UT) CAPS Take 1 capsule by mouth daily.    [provider]  clotrimazole-betamethasone (LOTRISONE) cream Apply 1 application topically 2 (two) times daily. 12/16/19   Ann Held, DO  Cyanocobalamin (VITAMIN B 12 PO) Take 2,500 mcg by mouth daily.    [provider]  cyclobenzaprine (FLEXERIL) 5 MG tablet Take 1-2 tablets (5-10 mg total) by mouth 3 (three) times daily as needed for muscle spasms. 09/10/19   Gregor Hams, MD  Insulin Pen Needle 31G X 5 MM MISC Use one pen needle once daily to inject Saxenda. 07/14/20   Dennard Nip D, MD  Liraglutide -Weight Management (SAXENDA) 18 MG/3ML SOPN Inject 3 mg into the skin daily. 07/14/20   Dennard Nip D, MD  medroxyPROGESTERone (PROVERA) 10 MG tablet Take 1 tablet (10 mg total) by mouth daily. Use for ten days 05/14/19   Lavonia Drafts, MD  metoprolol succinate (TOPROL-XL) 100 MG 24 hr tablet Take 1 tablet (100 mg total) by mouth daily. Take with or immediately following a meal. 07/14/20   Dennard Nip D, MD  Multiple Vitamins-Calcium (ONE-A-DAY WOMENS PO) Take 1 tablet by mouth daily.     [provider]  polyethylene glycol (MIRALAX / GLYCOLAX) 17 g packet Take 17 g by mouth daily as needed (constipation).    [provider]  Vitamin D, Ergocalciferol, (DRISDOL) 1.25 MG (50000 UNIT) CAPS capsule Take 1 capsule (50,000 Units total) by mouth every 7 (seven) days. 07/14/20   Dennard Nip D, MD    Allergies    Ibuprofen and Meloxicam  Review of Systems   Review of Systems  Respiratory: Positive for shortness of breath.   All other systems reviewed and are negative.   Physical Exam Updated Vital Signs BP 116/71   Pulse 73   Temp 98.2 F (36.8 C) (Oral)   Resp 20   Ht 5\' 5"  (1.651 m)   Wt (!) 202.7 kg   SpO2 100%    BMI 74.36 kg/m   Physical Exam Vitals and nursing note reviewed.  Constitutional:      Comments: Obese, slightly tachypneic   HENT:     Head: Normocephalic.     Mouth/Throat:  Mouth: Mucous membranes are moist.  Eyes:     Extraocular Movements: Extraocular movements intact.     Pupils: Pupils are equal, round, and reactive to light.  Cardiovascular:     Rate and Rhythm: Normal rate and regular rhythm.  Pulmonary:     Comments: Slightly tachypneic, diminished bilateral bases  Abdominal:     General: Bowel sounds are normal.     Palpations: Abdomen is soft.  Musculoskeletal:        General: Normal range of motion.     Cervical back: Normal range of motion and neck supple.     Comments: Trace edema bilaterally   Skin:    General: Skin is warm.     Capillary Refill: Capillary refill takes less than 2 seconds.  Neurological:     General: No focal deficit present.     Mental Status: She is oriented to person, place, and time.  Psychiatric:        Mood and Affect: Mood normal.        Behavior: Behavior normal.     ED Results / Procedures / Treatments   Labs (all labs ordered are listed, but only abnormal results are displayed) Labs Reviewed  COMPREHENSIVE METABOLIC PANEL - Abnormal; Notable for the following components:      Result Value   Glucose, Bld 112 (*)    All other components within normal limits  CBC WITH DIFFERENTIAL/PLATELET  BRAIN NATRIURETIC PEPTIDE  D-DIMER, QUANTITATIVE (NOT AT [facility])  TROPONIN I (HIGH SENSITIVITY)    EKG EKG Interpretation  Date/Time:  Sunday July 18 2020 21:10:10 EST Ventricular Rate:  74 PR Interval:    QRS Duration: 90 QT Interval:  405 QTC Calculation: 450 R Axis:   48 Text Interpretation: Sinus rhythm No significant change since last tracing Confirmed by Wandra Arthurs [phone]) on 07/18/2020 9:12:40 PM   Radiology DG Chest Port 1 View  Result Date: 07/18/2020 CLINICAL DATA:  Shortness of breath EXAM: PORTABLE CHEST  1 VIEW COMPARISON:  None. FINDINGS: The heart size and mediastinal contours are within normal limits. Both lungs are clear. The visualized skeletal structures are unremarkable. IMPRESSION: No active disease. Electronically Signed   By: Prudencio Pair M.D.   On: 07/18/2020 21:54    Procedures Procedures (including critical care time)  Medications Ordered in ED Medications  albuterol (VENTOLIN HFA) 108 (90 Base) MCG/ACT inhaler 2 puff (has no administration in time range)    ED Course  I have reviewed the triage vital signs and the nursing notes.  Pertinent labs & imaging results that were available during my care of the patient were reviewed by me and considered in my medical decision making (see chart for details).    MDM Rules/Calculators/A&P                         COLEY LITTLES is a 29 y.o. female here with shortness of breath.  Consider PE versus obesity hypoventilation syndrome.  She can also have diastolic heart failure as well.  Plan to get CBC, CMP, troponin, BNP, chest x-ray.  Patient has no Covid exposure and was fully vaccinated.   11:01 PM D-dimer negative, BNP normal. CXR normal. Trop negative. Maybe mild bronchitis. Will dc home with albuterol prn. Stable for discharge    Final Clinical Impression(s) / ED Diagnoses Final diagnoses:  None    Rx / DC Orders ED Discharge Orders    None       Darl Householder,  Lujean Rave, MD 07/18/20 [id number]

## 2020-07-18 NOTE — ED Triage Notes (Signed)
Pt reports 4 days of feeling SOB. States "feel like I can't take a deep breath". Pt is vaccinated for Covid. Ambulated to room 11; HR 95-100, O2 sats 100%. Dr. Darl Householder at bedside during triage

## 2020-07-18 NOTE — Discharge Instructions (Signed)
You may have mild asthma.  Your labs are unremarkable.  In particular we tested your heart function as well as for heart failure and for blood clots.  See your doctor for follow-up  Return to ER if you have worse shortness of breath, chest pain, fever, cough.

## 2020-07-19 ENCOUNTER — Telehealth (INDEPENDENT_AMBULATORY_CARE_PROVIDER_SITE_OTHER): Payer: Self-pay

## 2020-07-19 ENCOUNTER — Encounter (INDEPENDENT_AMBULATORY_CARE_PROVIDER_SITE_OTHER): Payer: Self-pay | Admitting: Family Medicine

## 2020-07-19 ENCOUNTER — Encounter: Payer: Self-pay | Admitting: Obstetrics & Gynecology

## 2020-07-19 ENCOUNTER — Ambulatory Visit (INDEPENDENT_AMBULATORY_CARE_PROVIDER_SITE_OTHER): Payer: BC Managed Care – PPO | Admitting: Obstetrics & Gynecology

## 2020-07-19 VITALS — BP 114/69 | HR 72 | Ht 65.0 in | Wt >= 6400 oz

## 2020-07-19 DIAGNOSIS — N6331 Unspecified lump in axillary tail of the right breast: Secondary | ICD-10-CM

## 2020-07-19 NOTE — Progress Notes (Signed)
GYNECOLOGY OFFICE VISIT NOTE  History:   Brandy Saunders is a 29 y.o. G0P0000 here today for evaluation of R breast lump noticed a few weeks ago. Occasionally tender to touch.  No nipple drainage.  No significant FH of breast or ovarian cancer, patient is just concerned because it has been there for weeks. She denies any abnormal vaginal discharge, bleeding, pelvic pain or other concerns.    Past Medical History:  Diagnosis Date  . Alcohol abuse   . Anxiety   . Asthma   . Back pain   . Binge eating   . Carpal tunnel syndrome, bilateral   . Chest pain   . Common migraine with intractable migraine 02/02/2017  . Constipation   . Depression   . Dyspnea   . Fatty liver   . GERD (gastroesophageal reflux disease)   . Hypertension   . Joint pain   . Leg edema   . Morbidly obese ([place])   . Palpitations   . PCOS (polycystic ovarian syndrome)   . Prediabetes   . Sleep apnea   . Vaginal Pap smear, abnormal   . Vitamin D deficiency     Past Surgical History:  Procedure Laterality Date  . TONSILECTOMY, ADENOIDECTOMY, BILATERAL MYRINGOTOMY AND TUBES  1994    The following portions of the patient's history were reviewed and updated as appropriate: allergies, current medications, past family history, past medical history, past social history, past surgical history and problem list.   Health Maintenance:  Normal pap on 05/14/2019.   Review of Systems:  Pertinent items noted in HPI and remainder of comprehensive ROS otherwise negative.  Physical Exam:  BP 114/69   Pulse 72   Ht 5\' 5"  (1.651 m)   Wt (!) 455 lb (206.4 kg)   LMP 05/18/2020   BMI 75.72 kg/m  BP 114/69   Pulse 72   Ht 5\' 5"  (1.651 m)   Wt (!) 455 lb (206.4 kg)   LMP 05/18/2020   BMI 75.72 kg/m  CONSTITUTIONAL: Well-developed, well-nourished female in no acute distress.  MUSCULOSKELETAL: Normal range of motion. No tenderness.  No cyanosis, clubbing, or edema.   NEUROLOGIC: Alert and oriented to person, place,  and time. Normal reflexes, muscle tone coordination. No cranial nerve deficit noted. PSYCHIATRIC: Normal mood and affect. Normal behavior. Normal judgment and thought content. CARDIOVASCULAR: Normal heart rate noted, RESPIRATORY:. Effort and breath sounds normal, no problems with respiration noted. BREASTS: Symmetric in size. Small <1cm mobile mass palpated in axillary tail of right breast about 15 cm down from midaxillary point, in the fold.  Obese habitus, hard to palpate initially.  No other masses, tenderness, skin changes, nipple drainage, or lymphadenopathy bilaterally.  Performed in the presence of a chaperone.     Assessment and Plan:      1. Lump of axillary tail of right breast Likely benign but will obtain imaging; will follow up results and manage accordingly. - US BREAST LTD UNI RIGHT INC AXILLA; Future - MM DIAG BREAST TOMO BILATERAL; Future Routine preventative health maintenance measures emphasized. Please refer to After Visit Summary for other counseling recommendations.   Return for any gynecologic concerns.    I spent 15 minutes dedicated to the care of this patient including pre-visit review of records, face to face time with the patient discussing her conditions and treatments and post visit ordering of testing.    Verita Schneiders, MD, [place] for [employer], [place]

## 2020-07-19 NOTE — Telephone Encounter (Signed)
PA for Saxenda was initiated and approved via [email or web address].   [name] (Key: [id number]) Rx #: [id number] Saxenda 18MG Fayne Mediate pen-injectors   Form [employer] Electronic PA Form [phone] NCPDP) Created 5 days ago Sent to Plan 7 minutes ago Plan Response 6 minutes ago Submit Clinical Questions 3 minutes ago Determination Favorable 2 minutes ago Your prior authorization for Brandy Saunders has been approved! MORE INFO For eligible patients, copay assistance may be available. To learn more and be redirected to the [place] [facility] website, click on the "More Info" button to the right. Please also note that you may need to schedule a follow-up visit with your patient prior to the expiration of this prior authorization, as updated patient weight may be required for reauthorization.  Message from plan: [id number];BCWUGQ:[id number];Review Type:Prior Auth;Coverage Start Date:06/19/2020;Coverage End Date:11/16/2020;  Pt notified.

## 2020-07-26 ENCOUNTER — Encounter: Payer: Self-pay | Admitting: Family Medicine

## 2020-07-26 ENCOUNTER — Inpatient Hospital Stay: Payer: BC Managed Care – PPO | Admitting: Family

## 2020-07-26 ENCOUNTER — Ambulatory Visit (INDEPENDENT_AMBULATORY_CARE_PROVIDER_SITE_OTHER): Payer: BC Managed Care – PPO | Admitting: Family Medicine

## 2020-07-26 ENCOUNTER — Other Ambulatory Visit: Payer: Self-pay

## 2020-07-26 VITALS — BP 132/84 | HR 84 | Temp 97.6°F | Resp 20 | Ht 65.0 in | Wt >= 6400 oz

## 2020-07-26 DIAGNOSIS — T7840XA Allergy, unspecified, initial encounter: Secondary | ICD-10-CM

## 2020-07-26 DIAGNOSIS — R21 Rash and other nonspecific skin eruption: Secondary | ICD-10-CM | POA: Diagnosis not present

## 2020-07-26 DIAGNOSIS — Z6841 Body Mass Index (BMI) 40.0 and over, adult: Secondary | ICD-10-CM | POA: Diagnosis not present

## 2020-07-26 NOTE — Assessment & Plan Note (Signed)
con't healthy weight and wellness 

## 2020-07-26 NOTE — Patient Instructions (Signed)
Shortness of Breath, Adult Shortness of breath is when a person has trouble breathing enough air or when a person feels like she or he is having trouble breathing in enough air. Shortness of breath could be a sign of a medical problem. Follow these instructions at home:   Pay attention to any changes in your symptoms.  Do not use any products that contain nicotine or tobacco, such as cigarettes, e-cigarettes, and chewing tobacco.  Do not smoke. Smoking is a common cause of shortness of breath. If you need help quitting, ask your health care provider.  Avoid things that can irritate your airways, such as: ? Mold. ? Dust. ? Air pollution. ? Chemical fumes. ? Things that can cause allergy symptoms (allergens), if you have allergies.  Keep your living space clean and free of mold and dust.  Rest as needed. Slowly return to your usual activities.  Take over-the-counter and prescription medicines only as told by your health care provider. This includes oxygen therapy and inhaled medicines.  Keep all follow-up visits as told by your health care provider. This is important. Contact a health care provider if:  Your condition does not improve as soon as expected.  You have a hard time doing your normal activities, even after you rest.  You have new symptoms. Get help right away if:  Your shortness of breath gets worse.  You have shortness of breath when you are resting.  You feel light-headed or you faint.  You have a cough that is not controlled with medicines.  You cough up blood.  You have pain with breathing.  You have pain in your chest, arms, shoulders, or abdomen.  You have a fever.  You cannot walk up stairs or exercise the way that you normally do. These symptoms may represent a serious problem that is an emergency. Do not wait to see if the symptoms will go away. Get medical help right away. Call your local emergency services (911 in the [place] Do not drive yourself  to the hospital. Summary  Shortness of breath is when a person has trouble breathing enough air. It can be a sign of a medical problem.  Avoid things that irritate your lungs, such as smoking, pollution, mold, and dust.  Pay attention to changes in your symptoms and contact your health care provider if you have a hard time completing daily activities because of shortness of breath. This information is not intended to replace advice given to you by your health care provider. Make sure you discuss any questions you have with your health care provider. Document Revised: 01/14/2018 Document Reviewed: 01/14/2018 Elsevier Patient Education  2020 [employer]  

## 2020-07-26 NOTE — Progress Notes (Signed)
Patient ID: Brandy Saunders, female    DOB: 10/16/1990  Age: 29 y.o. MRN: [id number]    Subjective:  Subjective  HPI Brandy Saunders presents for f/u er for sob , edema and rash.  Pt was seen in er and was given albuterol inhaler and told she may have allergies.  Cardiac w/u / labs were normal It was advised she see an allergist  Pt has just started with healthy weight and wellness and was started on saxenda.    Review of Systems  Constitutional: Negative for appetite change, diaphoresis, fatigue and unexpected weight change.  Eyes: Negative for pain, redness and visual disturbance.  Respiratory: Positive for shortness of breath. Negative for cough, chest tightness and wheezing.   Cardiovascular: Negative for chest pain, palpitations and leg swelling.  Endocrine: Negative for cold intolerance, heat intolerance, polydipsia, polyphagia and polyuria.  Genitourinary: Negative for difficulty urinating, dysuria and frequency.  Skin: Positive for rash.  Neurological: Negative for dizziness, light-headedness, numbness and headaches.    History Past Medical History:  Diagnosis Date  . Alcohol abuse   . Anxiety   . Asthma   . Back pain   . Binge eating   . Carpal tunnel syndrome, bilateral   . Chest pain   . Common migraine with intractable migraine 02/02/2017  . Constipation   . Depression   . Dyspnea   . Fatty liver   . GERD (gastroesophageal reflux disease)   . Hypertension   . Joint pain   . Leg edema   . Morbidly obese ([place])   . Palpitations   . PCOS (polycystic ovarian syndrome)   . Prediabetes   . Sleep apnea   . Vaginal Pap smear, abnormal   . Vitamin D deficiency     She has a past surgical history that includes Tonsilectomy, adenoidectomy, bilateral myringotomy and tubes (1994).   Her family history includes Alcoholism in her father; Diabetes in her maternal uncle; Drug abuse in her father; Heart disease in her maternal grandmother and mother; Hyperlipidemia in  her mother; Hypertension in her mother; Obesity in her father and mother; Stroke in her father; Sudden death in her father.She reports that she has quit smoking. Her smoking use included cigarettes. She has never used smokeless tobacco. She reports current alcohol use of about 3.0 standard drinks of alcohol per week. She reports that she does not use drugs.  Current Outpatient Medications on File Prior to Visit  Medication Sig Dispense Refill  . acetaminophen (TYLENOL) 500 MG tablet Take 500 mg by mouth every 6 (six) hours as needed.    Marland Kitchen albuterol (VENTOLIN HFA) 108 (90 Base) MCG/ACT inhaler Inhale into the lungs.    . Ascorbic Acid (VITAMIN C) 1000 MG tablet Take 1,000 mg by mouth daily.    Marland Kitchen BIOTIN 5000 PO Take 10,000 mg by mouth daily.     Marland Kitchen buPROPion (WELLBUTRIN SR) 200 MG 12 hr tablet Take 1 tablet (200 mg total) by mouth daily. 30 tablet 0  . Cholecalciferol (VITAMIN D) 50 MCG (2000 UT) CAPS Take 1 capsule by mouth daily.    . clotrimazole-betamethasone (LOTRISONE) cream Apply 1 application topically 2 (two) times daily. 30 g 0  . Cyanocobalamin (VITAMIN B 12 PO) Take 2,500 mcg by mouth daily.    . cyclobenzaprine (FLEXERIL) 5 MG tablet Take 1-2 tablets (5-10 mg total) by mouth 3 (three) times daily as needed for muscle spasms. 60 tablet 1  . Insulin Pen Needle 31G X 5 MM MISC Use  one pen needle once daily to inject Saxenda. 100 each 0  . Liraglutide -Weight Management (SAXENDA) 18 MG/3ML SOPN Inject 3 mg into the skin daily. 15 mL 0  . medroxyPROGESTERone (PROVERA) 10 MG tablet Take 1 tablet (10 mg total) by mouth daily. Use for ten days 10 tablet 2  . metoprolol succinate (TOPROL-XL) 100 MG 24 hr tablet Take 1 tablet (100 mg total) by mouth daily. Take with or immediately following a meal. 30 tablet 0  . Multiple Vitamins-Calcium (ONE-A-DAY WOMENS PO) Take 1 tablet by mouth daily.     . polyethylene glycol (MIRALAX / GLYCOLAX) 17 g packet Take 17 g by mouth daily as needed (constipation).     . Vitamin D, Ergocalciferol, (DRISDOL) 1.25 MG (50000 UNIT) CAPS capsule Take 1 capsule (50,000 Units total) by mouth every 7 (seven) days. 4 capsule 0   No current facility-administered medications on file prior to visit.     Objective:  Objective  Physical Exam Vitals and nursing note reviewed.  Constitutional:      Appearance: She is well-developed.  HENT:     Head: Normocephalic and atraumatic.  Eyes:     Conjunctiva/sclera: Conjunctivae normal.  Neck:     Thyroid: No thyromegaly.     Vascular: No carotid bruit or JVD.  Cardiovascular:     Rate and Rhythm: Normal rate and regular rhythm.     Heart sounds: Normal heart sounds. No murmur heard.   Pulmonary:     Effort: Pulmonary effort is normal. No respiratory distress.     Breath sounds: Normal breath sounds. No wheezing or rales.  Chest:     Chest wall: No tenderness.  Musculoskeletal:     Cervical back: Normal range of motion and neck supple.     Right lower leg: Edema present.     Left lower leg: Edema present.  Skin:    Findings: Rash present. Rash is macular.       Neurological:     Mental Status: She is alert and oriented to person, place, and time.    BP 132/84 (BP Location: Right Arm, Patient Position: Sitting, Cuff Size: Large)   Pulse 84   Temp 97.6 F (36.4 C) (Oral)   Resp 20   Ht 5\' 5"  (1.651 m)   Wt (!) 455 lb 6.4 oz (206.6 kg)   SpO2 96%   BMI 75.78 kg/m  Wt Readings from Last 3 Encounters:  07/26/20 (!) 455 lb 6.4 oz (206.6 kg)  07/19/20 (!) 455 lb (206.4 kg)  07/18/20 (!) 446 lb 14 oz (202.7 kg)     Lab Results  Component Value Date   WBC 5.6 07/18/2020   HGB 13.8 07/18/2020   HCT 42.5 07/18/2020   PLT 234 07/18/2020   GLUCOSE 112 (H) 07/18/2020   CHOL 165 02/26/2020   TRIG 104.0 02/26/2020   HDL 57.50 02/26/2020   LDLCALC 87 02/26/2020   ALT 38 07/18/2020   AST 26 07/18/2020   NA 137 07/18/2020   K 3.9 07/18/2020   CL 104 07/18/2020   CREATININE 0.90 07/18/2020   BUN 13  07/18/2020   CO2 24 07/18/2020   TSH 1.90 02/26/2020   HGBA1C 5.6 12/03/2019    DG Chest Port 1 View  Result Date: 07/18/2020 CLINICAL DATA:  Shortness of breath EXAM: PORTABLE CHEST 1 VIEW COMPARISON:  None. FINDINGS: The heart size and mediastinal contours are within normal limits. Both lungs are clear. The visualized skeletal structures are unremarkable. IMPRESSION: No active disease.  Electronically Signed   By: Prudencio Pair M.D.   On: 07/18/2020 21:54     Assessment & Plan:  Plan  I am having Mckenize L. Megan Salon maintain her Multiple Vitamins-Calcium (ONE-A-DAY WOMENS PO), BIOTIN 5000 PO, vitamin C, acetaminophen, Cyanocobalamin (VITAMIN B 12 PO), medroxyPROGESTERone, cyclobenzaprine, clotrimazole-betamethasone, Vitamin D, polyethylene glycol, Vitamin D (Ergocalciferol), metoprolol succinate, buPROPion, Saxenda, Insulin Pen Needle, and albuterol.  No orders of the defined types were placed in this encounter.   Problem List Items Addressed This Visit      Unprioritized   Rash - Primary   Relevant Orders   Ambulatory referral to Allergy    Other Visit Diagnoses    Allergy, initial encounter       Relevant Orders   Ambulatory referral to Allergy      Follow-up: Return in about 8 months (around 03/25/2021) for annual exam, fasting.  Ann Held, DO

## 2020-07-26 NOTE — Assessment & Plan Note (Signed)
And asthma Refer to allergy  con't inhaler and add antihistamine

## 2020-07-26 NOTE — Assessment & Plan Note (Signed)
con't lostrisone Consider derm if not improvement with all referral

## 2020-07-29 ENCOUNTER — Other Ambulatory Visit: Payer: Self-pay

## 2020-07-29 ENCOUNTER — Ambulatory Visit (INDEPENDENT_AMBULATORY_CARE_PROVIDER_SITE_OTHER): Payer: BC Managed Care – PPO | Admitting: Allergy & Immunology

## 2020-07-29 ENCOUNTER — Encounter: Payer: Self-pay | Admitting: Allergy & Immunology

## 2020-07-29 VITALS — BP 120/78 | HR 84 | Temp 97.5°F | Resp 18 | Ht 64.5 in | Wt >= 6400 oz

## 2020-07-29 DIAGNOSIS — R0602 Shortness of breath: Secondary | ICD-10-CM | POA: Diagnosis not present

## 2020-07-29 DIAGNOSIS — L500 Allergic urticaria: Secondary | ICD-10-CM | POA: Diagnosis not present

## 2020-07-29 DIAGNOSIS — R21 Rash and other nonspecific skin eruption: Secondary | ICD-10-CM

## 2020-07-29 DIAGNOSIS — L299 Pruritus, unspecified: Secondary | ICD-10-CM | POA: Diagnosis not present

## 2020-07-29 NOTE — Progress Notes (Signed)
NEW PATIENT  Date of Service/Encounter:  07/29/20  Referring provider: Carollee Saunders, Brandy Apa, DO   Assessment:   Rash  SOB (shortness of breath)   I honestly have no idea what is going on with her rash.  It seems she is already had an autoimmune work-up, as I do not think there is any reason to do any more with that.  We are going to get an environmental allergy panel as well as an alpha gal panel.  We will look for the presence of antimast cell antibodies with chronic urticaria panel and rule out mastocytosis with a serum tryptase.  She brought up the idea of sarcoidosis, which frankly can do a number of things.  So we will look for that with an ACE level.  I did ask her to come in when she had an outbreak of the rash.  We will be able to work her in for a quick visit.  Plan/Recommendations:   1. Rash - We are going to get some labs to rule out weird causes of the rash.  - Allergens Zone 2  - Alpha-Gal Panel  - Chronic Urticaria  - Tryptase  - Angiotensin converting enzyme - Call when the rash comes back and we can work you into the schedule. - Email me pictures of the rash that you have on your phone ([email or web address])  2. SOB (shortness of breath) - Lung testing looked good. - Continue with albuterol 4 puffs every 4-6 hours as needed. - There is no need for a controller medication at this time.  3. Return in about 3 months (around 10/27/2020).    Subjective:   Brandy Saunders is a 29 y.o. female presenting today for evaluation of  Chief Complaint  Patient presents with  . Rash  . Wheezing    possible asthma  . Eczema    Brandy Saunders has a history of the following: Patient Active Problem List   Diagnosis Date Noted  . Allergies 07/26/2020  . Lymphedema 05/05/2020  . Vitamin D deficiency 01/22/2020  . Rash 12/16/2019  . Nevus 12/16/2019  . Depression 10/19/2019  . Hyperlipidemia 09/18/2019  . Spasm of thoracic back muscle 09/18/2019    . Palpitations 08/30/2018  . Obstructive sleep apnea 08/30/2018  . Pain of left calf 12/14/2017  . Other fatigue 10/11/2017  . Shortness of breath on exertion 10/11/2017  . Prediabetes 10/11/2017  . Chronic left-sided thoracic back pain 05/23/2017  . Acute left-sided thoracic back pain 05/09/2017  . Migraine 05/09/2017  . Essential hypertension 05/09/2017  . Common migraine with intractable migraine 02/02/2017  . Chronic fatigue 01/06/2016  . Fatty liver 02/15/2015  . Esophageal reflux 02/15/2015  . Late menses 07/30/2014  . Class 3 severe obesity with serious comorbidity and body mass index (BMI) greater than or equal to 70 in adult [facility]) 07/30/2014  . Asthma without acute exacerbation 07/30/2014  . IBS (irritable bowel syndrome) 07/30/2014  . Elevated BP 07/30/2014  . PCOS (polycystic ovarian syndrome) 11/25/2012  . Carpal tunnel syndrome, bilateral     History obtained from: chart review and patient. She is from [place] and moved to [place] when she was 43.  Brandy Saunders was referred by Brandy Saunders, Brandy Apa, DO.     [name] is a 29 y.o. female presenting for an evaluation of a rash and shortness of breath.   Asthma/Respiratory Symptom History: She does report some wheezing today. She has no wheezing outside of the episodes, but she does  report some wheezing. She does have an inhaler now as of last week and she was discharged with an albuterol inhaler. She did have childhood asthma and was on steroids for multiple episodes per year.  Food Allergy Symptom History: She did have a peanut allergy as a child but she now eats everthing including peanuts without a problem.  Eczema Symptom History: She had eczmea on her neck as well as bilaterla hand swelling. She had head to toe itching and a rash over her entire body. She had some chest tightness as well for a couple of days. It has been recurring around every month. Eczema breaks out. She has been using a prescription topical  cream for this. These episodes tend to last around five days or so. It will resolve completely. Sometimes it gets to the point where it is blistery and scabs.   She was recently started on Saxenda. She has a history of chronic constipation.   She never saw a Dermatologist. This workup has been performed by her PCP predominantly. She did get a Lotrimin cream since it was thought to be ringworm. Her ANA was positive, but the subsequent workup has been negative. It was only one visit and they did not have a follow up. She was never put on steroids due to her weight.    There is no association with her menstrual cycles. There does not seem to be a trigger at all. She thinks maybe it is stress related. There is no association with her work exposure. She is now working for [employer].  She has episodes where she becomes "deaf" and then it slowly comes back. It seems that this is more of a muffled sensation rather than full deafness.  Otherwise, there is no history of other atopic diseases, including drug allergies, stinging insect allergies, urticaria or contact dermatitis. There is no significant infectious history. Vaccinations are up to date.    Past Medical History: Patient Active Problem List   Diagnosis Date Noted  . Allergies 07/26/2020  . Lymphedema 05/05/2020  . Vitamin D deficiency 01/22/2020  . Rash 12/16/2019  . Nevus 12/16/2019  . Depression 10/19/2019  . Hyperlipidemia 09/18/2019  . Spasm of thoracic back muscle 09/18/2019  . Palpitations 08/30/2018  . Obstructive sleep apnea 08/30/2018  . Pain of left calf 12/14/2017  . Other fatigue 10/11/2017  . Shortness of breath on exertion 10/11/2017  . Prediabetes 10/11/2017  . Chronic left-sided thoracic back pain 05/23/2017  . Acute left-sided thoracic back pain 05/09/2017  . Migraine 05/09/2017  . Essential hypertension 05/09/2017  . Common migraine with intractable migraine 02/02/2017  . Chronic fatigue 01/06/2016  . Fatty liver  02/15/2015  . Esophageal reflux 02/15/2015  . Late menses 07/30/2014  . Class 3 severe obesity with serious comorbidity and body mass index (BMI) greater than or equal to 70 in adult [facility]) 07/30/2014  . Asthma without acute exacerbation 07/30/2014  . IBS (irritable bowel syndrome) 07/30/2014  . Elevated BP 07/30/2014  . PCOS (polycystic ovarian syndrome) 11/25/2012  . Carpal tunnel syndrome, bilateral     Medication List:  Allergies as of 07/29/2020      Reactions   Ibuprofen Diarrhea   Meloxicam    Pt stated, "It made my emotions out of wack; it made my left hand numb"      Medication List       Accurate as of July 29, 2020 11:59 PM. If you have any questions, ask your nurse or doctor.  acetaminophen 500 MG tablet Commonly known as: TYLENOL Take 500 mg by mouth every 6 (six) hours as needed.   albuterol 108 (90 Base) MCG/ACT inhaler Commonly known as: VENTOLIN HFA Inhale into the lungs.   BIOTIN 5000 PO Take 10,000 mg by mouth daily.   buPROPion 200 MG 12 hr tablet Commonly known as: WELLBUTRIN SR Take 1 tablet (200 mg total) by mouth daily.   clotrimazole-betamethasone cream Commonly known as: Lotrisone Apply 1 application topically 2 (two) times daily.   cyclobenzaprine 5 MG tablet Commonly known as: FLEXERIL Take 1-2 tablets (5-10 mg total) by mouth 3 (three) times daily as needed for muscle spasms.   Insulin Pen Needle 31G X 5 MM Misc Use one pen needle once daily to inject Saxenda.   medroxyPROGESTERone 10 MG tablet Commonly known as: PROVERA Take 1 tablet (10 mg total) by mouth daily. Use for ten days   metoprolol succinate 100 MG 24 hr tablet Commonly known as: TOPROL-XL Take 1 tablet (100 mg total) by mouth daily. Take with or immediately following a meal.   ONE-A-DAY WOMENS PO Take 1 tablet by mouth daily.   polyethylene glycol 17 g packet Commonly known as: MIRALAX / GLYCOLAX Take 17 g by mouth daily as needed (constipation).     Saxenda 18 MG/3ML Sopn Generic drug: Liraglutide -Weight Management Inject 3 mg into the skin daily.   VITAMIN B 12 PO Take 2,500 mcg by mouth daily.   vitamin C 1000 MG tablet Take 1,000 mg by mouth daily.   Vitamin D (Ergocalciferol) 1.25 MG (50000 UNIT) Caps capsule Commonly known as: DRISDOL Take 1 capsule (50,000 Units total) by mouth every 7 (seven) days.   Vitamin D 50 MCG (2000 UT) Caps Take 1 capsule by mouth daily.       Birth History: non-contributory  Developmental History: non-contributory  Past Surgical History: Past Surgical History:  Procedure Laterality Date  . ADENOIDECTOMY    . TONSILECTOMY, ADENOIDECTOMY, BILATERAL MYRINGOTOMY AND TUBES  1994     Family History: Family History  Problem Relation Age of Onset  . Hypertension Mother   . Hyperlipidemia Mother   . Obesity Mother   . Heart disease Mother   . Stroke Father   . Obesity Father   . Alcoholism Father   . Drug abuse Father   . Sudden death Father   . Diabetes Maternal Uncle   . Heart disease Maternal Grandmother   . Colon cancer Neg Hx      Social History: Ashiyah lives at home with her fianc.  She lives in an apartment.  There is hardwood throughout the home with rugs.  She does have some carpeting in the bedroom.  There is electric heating and central cooling.  There is a dog inside of the home.  There are no dust mite covers on the bedding.  There is tobacco exposure in the form of vaping.  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  She is exposed to fumes, chemicals, or dust.  She is unsure if she has a HEPA filter.  [place].   Review of Systems  Constitutional: Negative.  Negative for chills, fever, malaise/fatigue and weight loss.  HENT: Positive for congestion. Negative for ear discharge, ear pain and sinus pain.   Eyes: Negative for pain, discharge and redness.  Respiratory: Negative  for cough, sputum production, shortness of breath and wheezing.   Cardiovascular:  Negative.  Negative for chest pain and palpitations.  Gastrointestinal: Negative for abdominal pain, constipation, diarrhea, heartburn, nausea and vomiting.  Skin: Positive for itching and rash.  Neurological: Negative for dizziness and headaches.  Endo/Heme/Allergies: Positive for environmental allergies. Does not bruise/bleed easily.       Objective:   Blood pressure 120/78, pulse 84, temperature (!) 97.5 F (36.4 C), temperature source Temporal, resp. rate 18, height 5' 4.5" (1.638 m), weight (!) 460 lb 12.8 oz (209 kg), SpO2 97 %. Body mass index is 77.87 kg/m.   Physical Exam:   Physical Exam Constitutional:      Appearance: She is well-developed. She is obese.  HENT:     Head: Normocephalic.     Right Ear: Tympanic membrane, ear canal and external ear normal. No drainage, swelling or tenderness. Tympanic membrane is not injected, scarred, erythematous, retracted or bulging.     Left Ear: Tympanic membrane, ear canal and external ear normal. No drainage, swelling or tenderness. Tympanic membrane is not injected, scarred, erythematous, retracted or bulging.     Nose: No nasal deformity, septal deviation, mucosal edema or rhinorrhea.     Right Turbinates: Enlarged and swollen.     Left Turbinates: Enlarged and swollen.     Right Sinus: No maxillary sinus tenderness or frontal sinus tenderness.     Left Sinus: No maxillary sinus tenderness or frontal sinus tenderness.     Comments: No mucus.    Mouth/Throat:     Mouth: Mucous membranes are not pale and not dry.     Pharynx: Uvula midline.  Eyes:     General:        Right eye: No discharge.        Left eye: No discharge.     Conjunctiva/sclera: Conjunctivae normal.     Right eye: Right conjunctiva is not injected. No chemosis.    Left eye: Left conjunctiva is not injected. No chemosis.    Pupils: Pupils are equal, round, and reactive to  light.  Cardiovascular:     Rate and Rhythm: Normal rate and regular rhythm.     Heart sounds: Normal heart sounds.  Pulmonary:     Effort: Pulmonary effort is normal. No tachypnea, accessory muscle usage or respiratory distress.     Breath sounds: Normal breath sounds. No wheezing, rhonchi or rales.     Comments: Difficult to hear due to body habitus. Chest:     Chest wall: No tenderness.  Abdominal:     Tenderness: There is no abdominal tenderness. There is no guarding or rebound.  Lymphadenopathy:     Head:     Right side of head: No submandibular, tonsillar or occipital adenopathy.     Left side of head: No submandibular, tonsillar or occipital adenopathy.     Cervical: No cervical adenopathy.  Skin:    Coloration: Skin is not pale.     Findings: No abrasion, erythema, petechiae or rash. Rash is not papular, urticarial or vesicular.     Comments: No eczematous or urticarial lesions noted.  There are some areas where she has scratched and there are some resolving pustules.  Neurological:     Mental Status: She is alert.  Psychiatric:        Behavior: Behavior is cooperative.      Diagnostic studies:    Spirometry: results normal (FEV1: 2.46/89%, FVC: 3.07/95%, FEV1/FVC: 80%).    Spirometry consistent with normal pattern.   Allergy Studies:  labs sent instead  Debra Colon, MD [facility]      

## 2020-07-29 NOTE — Patient Instructions (Addendum)
1. Rash - We are going to get some labs to rule out weird causes of the rash.  - Allergens Zone 2  - Alpha-Gal Panel  - Chronic Urticaria  - Tryptase  - Angiotensin converting enzyme - Call when the rash comes back and we can work you into the schedule. - Email me pictures of the rash that you have on your phone ([email or web address])  2. SOB (shortness of breath) - Lung testing looked good. - Continue with albuterol 4 puffs every 4-6 hours as needed. - There is no need for a controller medication at this time.  3. Return in about 3 months (around 10/27/2020).    Please inform us of any Emergency Department visits, hospitalizations, or changes in symptoms. Call us before going to the ED for breathing or allergy symptoms since we might be able to fit you in for a sick visit. Feel free to contact us anytime with any questions, problems, or concerns.  It was a pleasure to meet you today!  Websites that have reliable patient information: 1. American Academy of Asthma, Allergy, and Immunology: www.aaaai.org 2. Food Allergy Research and Education (FARE): foodallergy.org 3. Mothers of Asthmatics: http://www.asthmacommunitynetwork.org 4. American College of Allergy, Asthma, and Immunology: www.acaai.org   COVID-19 Vaccine Information can be found at: ShippingScam.co.uk For questions related to vaccine distribution or appointments, please email [email or web address] or call 7244751795.     "Like" [place] on Facebook and Instagram for our latest updates!     HAPPY FALL!     Make sure you are registered to vote! If you have moved or changed any of your contact information, you will need to get this updated before voting!  In some cases, you MAY be able to register to vote online: [email or web address]

## 2020-08-01 ENCOUNTER — Encounter: Payer: Self-pay | Admitting: Allergy & Immunology

## 2020-08-03 ENCOUNTER — Other Ambulatory Visit: Payer: Self-pay

## 2020-08-03 ENCOUNTER — Ambulatory Visit (INDEPENDENT_AMBULATORY_CARE_PROVIDER_SITE_OTHER): Payer: BC Managed Care – PPO | Admitting: Family Medicine

## 2020-08-03 ENCOUNTER — Encounter (INDEPENDENT_AMBULATORY_CARE_PROVIDER_SITE_OTHER): Payer: Self-pay | Admitting: Family Medicine

## 2020-08-03 VITALS — BP 116/76 | HR 81 | Temp 97.6°F | Ht 65.0 in | Wt >= 6400 oz

## 2020-08-03 DIAGNOSIS — Z9189 Other specified personal risk factors, not elsewhere classified: Secondary | ICD-10-CM

## 2020-08-03 DIAGNOSIS — F3289 Other specified depressive episodes: Secondary | ICD-10-CM

## 2020-08-03 DIAGNOSIS — F4320 Adjustment disorder, unspecified: Secondary | ICD-10-CM | POA: Diagnosis not present

## 2020-08-03 DIAGNOSIS — Z6841 Body Mass Index (BMI) 40.0 and over, adult: Secondary | ICD-10-CM

## 2020-08-03 DIAGNOSIS — E66813 Obesity, class 3: Secondary | ICD-10-CM

## 2020-08-03 DIAGNOSIS — I10 Essential (primary) hypertension: Secondary | ICD-10-CM

## 2020-08-03 DIAGNOSIS — E559 Vitamin D deficiency, unspecified: Secondary | ICD-10-CM

## 2020-08-04 LAB — ALPHA-GAL PANEL

## 2020-08-04 MED ORDER — VITAMIN D (ERGOCALCIFEROL) 1.25 MG (50000 UNIT) PO CAPS: 50000.0000 [IU] | ORAL_CAPSULE | ORAL | 0 refills | Status: DC

## 2020-08-04 MED ORDER — METOPROLOL SUCCINATE ER 100 MG PO TB24: 100.0000 mg | ORAL_TABLET | Freq: Every day | ORAL | 0 refills | Status: DC

## 2020-08-04 MED ORDER — BUPROPION HCL ER (SR) 200 MG PO TB12: 200.0000 mg | ORAL_TABLET | Freq: Every day | ORAL | 0 refills | Status: DC

## 2020-08-04 NOTE — Progress Notes (Signed)
Chief Complaint:   OBESITY Brandy Saunders is here to discuss her progress with her obesity treatment plan along with follow-up of her obesity related diagnoses. Brandy Saunders is on keeping a food journal and adhering to recommended goals of 1800 calories and 125+ grams of protein daily and states she is following her eating plan approximately 40% of the time. Brandy Saunders states she is walking the dog for 30 minutes 4 times per week, and is active at her part time job.  Today's visit was #: 63 Starting weight: 447 lbs Starting date: 10/11/2017 Today's weight: 454 lbs Today's date: 08/03/2020 Total lbs lost to date: 0 Total lbs lost since last in-office visit: 0  Interim History: Brandy Saunders has struggled to stay on track recently, especially over the holidays. She started [place] and she has had some mild nausea, but this seems to have improved. She would like to try to increase her dose.  Subjective:   1. Essential hypertension Brandy Saunders's blood pressure is stable on her medications, and she denies signs of lightheadedness or chest pain.  2. Vitamin D deficiency Brandy Saunders is stable on Vit D, and she requests a refill today.  3. Other depression, with emotional eating Brandy Saunders still struggles with emotional eating, but she is tolerating her medications well. Her blood pressure is stable.  4. At risk for activity intolerance Brandy Saunders is at risk for exercise intolerance due to weight loss without adding strengthening exercise yet  Assessment/Plan:   1. Essential hypertension Brandy Saunders is working on healthy weight loss and exercise to improve blood pressure control. She will watch for signs of hypotension as she continues her lifestyle modifications. We will refill Toprol for 1 month, and will continue to monitor closely.  - metoprolol succinate (TOPROL-XL) 100 MG 24 hr tablet; Take 1 tablet (100 mg total) by mouth daily. Take with or immediately following a meal.  Dispense: 30 tablet; Refill:  0  2. Vitamin D deficiency Low Vitamin D level contributes to fatigue and are associated with obesity, breast, and colon cancer. We will refill prescription Vitamin D for 1 month. Brandy Saunders will follow-up for routine testing of Vitamin D, at least 2-3 times per year to avoid over-replacement.  - Vitamin D, Ergocalciferol, (DRISDOL) 1.25 MG (50000 UNIT) CAPS capsule; Take 1 capsule (50,000 Units total) by mouth every 7 (seven) days.  Dispense: 4 capsule; Refill: 0  3. Other depression, with emotional eating Behavior modification techniques were discussed today to help Brandy Saunders deal with her emotional/non-hunger eating behaviors. We will refill Wellbutrin SR for 1 month. We may need to increase this dose eventually if she continues to struggle after the holidays. Orders and follow up as documented in patient record.   - buPROPion (WELLBUTRIN SR) 200 MG 12 hr tablet; Take 1 tablet (200 mg total) by mouth daily.  Dispense: 30 tablet; Refill: 0  4. At risk for activity intolerance Brandy Saunders was given up to 15 minutes of exercise intolerance counseling today. She is 29 y.o. female and has risk factors exercise intolerance including obesity. We discussed intensive lifestyle modifications today with an emphasis on specific weight loss instructions and strategies. Brandy Saunders will slowly increase activity as tolerated.  Repetitive spaced learning was employed today to elicit superior memory formation and behavioral change.  5. Class 3 severe obesity with serious comorbidity and body mass index (BMI) greater than or equal to 70 in adult, unspecified obesity type (HCC) Brandy Saunders is currently in the action stage of change. As such, her goal is to continue with weight  loss efforts. She has agreed to keeping a food journal and adhering to recommended goals of 1800 calories and 125+ grams of protein daily.   We discussed various medication options to help Brandy Saunders with her weight loss efforts and it is ok for her to  increase Saxenda to 1.2 mg.  Exercise goals: As is.  Behavioral modification strategies: increasing lean protein intake and decreasing simple carbohydrates.  Brandy Saunders has agreed to follow-up with our clinic in 4 weeks. She was informed of the importance of frequent follow-up visits to maximize her success with intensive lifestyle modifications for her multiple health conditions.   Objective:   Blood pressure 116/76, pulse 81, temperature 97.6 F (36.4 C), height 5\' 5"  (1.651 m), weight (!) 454 lb (205.9 kg), SpO2 99 %. Body mass index is 75.55 kg/m.  General: Cooperative, alert, well developed, in no acute distress. HEENT: Conjunctivae and lids unremarkable. Cardiovascular: Regular rhythm.  Lungs: Normal work of breathing. Neurologic: No focal deficits.   Lab Results  Component Value Date   CREATININE 0.90 07/18/2020   BUN 13 07/18/2020   NA 137 07/18/2020   K 3.9 07/18/2020   CL 104 07/18/2020   CO2 24 07/18/2020   Lab Results  Component Value Date   ALT 38 07/18/2020   AST 26 07/18/2020   ALKPHOS 48 07/18/2020   BILITOT 0.7 07/18/2020   Lab Results  Component Value Date   HGBA1C 5.6 12/03/2019   HGBA1C 5.8 (H) 06/17/2019   HGBA1C 5.8 (H) 05/02/2018   HGBA1C 5.7 (H) 01/17/2018   HGBA1C 6.0 (H) 10/11/2017   Lab Results  Component Value Date   INSULIN 30.8 (H) 12/03/2019   INSULIN 35.9 (H) 06/17/2019   INSULIN 30.3 (H) 05/02/2018   INSULIN 39.5 (H) 01/17/2018   INSULIN 73.6 (H) 10/11/2017   Lab Results  Component Value Date   TSH 1.90 02/26/2020   Lab Results  Component Value Date   CHOL 165 02/26/2020   HDL 57.50 02/26/2020   LDLCALC 87 02/26/2020   TRIG 104.0 02/26/2020   CHOLHDL 3 02/26/2020   Lab Results  Component Value Date   WBC 5.6 07/18/2020   HGB 13.8 07/18/2020   HCT 42.5 07/18/2020   MCV 86.4 07/18/2020   PLT 234 07/18/2020   No results found for: IRON, TIBC, FERRITIN  Attestation Statements:   Reviewed by clinician on day of  visit: allergies, medications, problem list, medical history, surgical history, family history, social history, and previous encounter notes.   I, Trixie Dredge, am acting as transcriptionist for Dennard Nip, MD.  I have reviewed the above documentation for accuracy and completeness, and I agree with the above. -  Dennard Nip, MD

## 2020-08-05 ENCOUNTER — Encounter: Payer: Self-pay | Admitting: Allergy & Immunology

## 2020-08-10 ENCOUNTER — Other Ambulatory Visit: Payer: Self-pay

## 2020-08-10 MED ORDER — TRIAMCINOLONE ACETONIDE 0.1 % EX OINT: 1.0000 "application " | TOPICAL_OINTMENT | Freq: Two times a day (BID) | CUTANEOUS | 2 refills | Status: DC

## 2020-08-10 MED ORDER — TRIAMCINOLONE ACETONIDE 0.1 % EX OINT: TOPICAL_OINTMENT | CUTANEOUS | 2 refills | Status: DC

## 2020-08-11 ENCOUNTER — Other Ambulatory Visit (INDEPENDENT_AMBULATORY_CARE_PROVIDER_SITE_OTHER): Payer: Self-pay | Admitting: Family Medicine

## 2020-08-11 ENCOUNTER — Encounter (INDEPENDENT_AMBULATORY_CARE_PROVIDER_SITE_OTHER): Payer: Self-pay | Admitting: Family Medicine

## 2020-08-11 DIAGNOSIS — I10 Essential (primary) hypertension: Secondary | ICD-10-CM

## 2020-08-11 NOTE — Telephone Encounter (Signed)
Last seen by Dr Beasley 

## 2020-08-13 DIAGNOSIS — F4320 Adjustment disorder, unspecified: Secondary | ICD-10-CM | POA: Diagnosis not present

## 2020-08-13 LAB — IGE+ALLERGENS ZONE 2(30)
Alternaria Alternata IgE: 0.29 kU/L — AB
Amer Sycamore IgE Qn: <0.1 kU/L
Aspergillus Fumigatus IgE: 0.19 kU/L — AB
Bahia Grass IgE: 0.12 kU/L — AB
Bermuda Grass IgE: <0.1 kU/L
Cat Dander IgE: 0.75 kU/L — AB
Cedar, Mountain IgE: <0.1 kU/L
Cladosporium Herbarum IgE: 0.13 kU/L — AB
Cockroach, American IgE: <0.1 kU/L
Common Silver Birch IgE: <0.1 kU/L
D Farinae IgE: 0.87 kU/L — AB
D Pteronyssinus IgE: 0.52 kU/L — AB
Dog Dander IgE: 0.13 kU/L — AB
Elm, American IgE: <0.1 kU/L
Hickory, White IgE: <0.1 kU/L
IgE (Immunoglobulin E), Serum: 143 IU/mL (ref 6–495)
Johnson Grass IgE: <0.1 kU/L
Maple/Box Elder IgE: <0.1 kU/L
Mucor Racemosus IgE: <0.1 kU/L
Mugwort IgE Qn: <0.1 kU/L
Nettle IgE: <0.1 kU/L
Oak, White IgE: <0.1 kU/L
Penicillium Chrysogen IgE: <0.1 kU/L
Pigweed, Rough IgE: <0.1 kU/L
Plantain, English IgE: <0.1 kU/L
Ragweed, Short IgE: <0.1 kU/L
Sheep Sorrel IgE Qn: <0.1 kU/L
Stemphylium Herbarum IgE: 0.73 kU/L — AB
Sweet gum IgE RAST Ql: <0.1 kU/L
Timothy Grass IgE: 0.16 kU/L — AB
White Mulberry IgE: <0.1 kU/L

## 2020-08-13 LAB — ANGIOTENSIN CONVERTING ENZYME: Angio Convert Enzyme: 71 U/L (ref 14–82)

## 2020-08-13 LAB — ALPHA-GAL PANEL
Alpha Gal IgE*: <0.1 kU/L (ref <0.10)
Beef (Bos spp) IgE: <0.1 kU/L (ref <0.35)
Class Interpretation: 0
Class Interpretation: 0
Class Interpretation: 0
Lamb/Mutton (Ovis spp) IgE: <0.1 kU/L (ref <0.35)
Pork (Sus spp) IgE: <0.1 kU/L (ref <0.35)

## 2020-08-13 LAB — CHRONIC URTICARIA: cu index: <3.2 (ref <10)

## 2020-08-13 LAB — TRYPTASE: Tryptase: 5.7 ug/L (ref 2.2–13.2)

## 2020-08-16 ENCOUNTER — Other Ambulatory Visit (INDEPENDENT_AMBULATORY_CARE_PROVIDER_SITE_OTHER): Payer: Self-pay | Admitting: Family Medicine

## 2020-08-16 ENCOUNTER — Encounter (INDEPENDENT_AMBULATORY_CARE_PROVIDER_SITE_OTHER): Payer: Self-pay

## 2020-08-16 DIAGNOSIS — Z6841 Body Mass Index (BMI) 40.0 and over, adult: Secondary | ICD-10-CM

## 2020-08-16 NOTE — Telephone Encounter (Signed)
MyChart message sent to pt to find out if they have enough medication to get them through until next appt.   

## 2020-08-17 NOTE — Telephone Encounter (Signed)
Refill form given to MD for approval.

## 2020-08-19 ENCOUNTER — Other Ambulatory Visit: Payer: BC Managed Care – PPO

## 2020-08-25 DIAGNOSIS — F4320 Adjustment disorder, unspecified: Secondary | ICD-10-CM | POA: Diagnosis not present

## 2020-08-30 ENCOUNTER — Ambulatory Visit: Payer: BC Managed Care – PPO | Admitting: Family Medicine

## 2020-09-01 ENCOUNTER — Ambulatory Visit (INDEPENDENT_AMBULATORY_CARE_PROVIDER_SITE_OTHER): Payer: BC Managed Care – PPO | Admitting: Family Medicine

## 2020-09-03 DIAGNOSIS — F4320 Adjustment disorder, unspecified: Secondary | ICD-10-CM | POA: Diagnosis not present

## 2020-09-10 ENCOUNTER — Other Ambulatory Visit (INDEPENDENT_AMBULATORY_CARE_PROVIDER_SITE_OTHER): Payer: Self-pay | Admitting: Family Medicine

## 2020-09-10 DIAGNOSIS — I10 Essential (primary) hypertension: Secondary | ICD-10-CM

## 2020-09-12 ENCOUNTER — Other Ambulatory Visit (INDEPENDENT_AMBULATORY_CARE_PROVIDER_SITE_OTHER): Payer: Self-pay | Admitting: Family Medicine

## 2020-09-12 DIAGNOSIS — E559 Vitamin D deficiency, unspecified: Secondary | ICD-10-CM

## 2020-09-13 NOTE — Telephone Encounter (Signed)
Dr.Beasley 

## 2020-09-15 ENCOUNTER — Other Ambulatory Visit: Payer: Self-pay

## 2020-09-15 ENCOUNTER — Telehealth (INDEPENDENT_AMBULATORY_CARE_PROVIDER_SITE_OTHER): Payer: BC Managed Care – PPO | Admitting: Family Medicine

## 2020-09-15 ENCOUNTER — Encounter (INDEPENDENT_AMBULATORY_CARE_PROVIDER_SITE_OTHER): Payer: Self-pay | Admitting: Family Medicine

## 2020-09-15 ENCOUNTER — Other Ambulatory Visit (INDEPENDENT_AMBULATORY_CARE_PROVIDER_SITE_OTHER): Payer: Self-pay | Admitting: Family Medicine

## 2020-09-15 DIAGNOSIS — Z6841 Body Mass Index (BMI) 40.0 and over, adult: Secondary | ICD-10-CM

## 2020-09-15 DIAGNOSIS — Z9189 Other specified personal risk factors, not elsewhere classified: Secondary | ICD-10-CM

## 2020-09-15 DIAGNOSIS — I10 Essential (primary) hypertension: Secondary | ICD-10-CM

## 2020-09-15 DIAGNOSIS — E559 Vitamin D deficiency, unspecified: Secondary | ICD-10-CM

## 2020-09-15 DIAGNOSIS — F3289 Other specified depressive episodes: Secondary | ICD-10-CM

## 2020-09-15 MED ORDER — SAXENDA 18 MG/3ML ~~LOC~~ SOPN: PEN_INJECTOR | SUBCUTANEOUS | 0 refills | Status: DC

## 2020-09-15 MED ORDER — METOPROLOL SUCCINATE ER 100 MG PO TB24: 100.0000 mg | ORAL_TABLET | Freq: Every day | ORAL | 0 refills | Status: DC

## 2020-09-15 MED ORDER — PEN NEEDLES 32G X 6 MM MISC: 0 refills | Status: DC

## 2020-09-15 MED ORDER — BUPROPION HCL ER (SR) 200 MG PO TB12: 200.0000 mg | ORAL_TABLET | Freq: Every day | ORAL | 0 refills | Status: DC

## 2020-09-15 MED ORDER — VITAMIN D (ERGOCALCIFEROL) 1.25 MG (50000 UNIT) PO CAPS: 50000.0000 [IU] | ORAL_CAPSULE | ORAL | 0 refills | Status: DC

## 2020-09-16 NOTE — Progress Notes (Signed)
TeleHealth Visit:  Due to the COVID-19 pandemic, this visit was completed with telemedicine (audio/video) technology to reduce patient and provider exposure as well as to preserve personal protective equipment.   Brandy Saunders has verbally consented to this TeleHealth visit. The patient is located at home, the provider is located at the [facility] office. The participants in this visit include the listed provider and patient. The visit was conducted today via MyChart video.   Chief Complaint: OBESITY Brandy Saunders is here to discuss her progress with her obesity treatment plan along with follow-up of her obesity related diagnoses. Brandy Saunders is on keeping a food journal and adhering to recommended goals of 1800 calories and 125+ grams of protein daily and states she is following her eating plan approximately 60% of the time. Brandy Saunders states she is walking for 15 minutes 4 times per week.  Today's visit was #: 39 Starting weight: 447 lbs Starting date: 10/11/2017  Interim History: Brandy Saunders feels she has done well maintaining her weight over the holidays. She increased sexanda to 1.2 mg and she felt her hunger had actually worsened.  Subjective:   1. Essential hypertension [place]ina ia working on diet and weight loss to improve her blood pressure. She denies lightheadedness, and she is tolerating her medications well.  2. Vitamin D deficiency Brandy Saunders is stable on Vit D, and she denies nausea or vomiting.  3. Other depression, with emotional eating Brandy Saunders has had increased stress, but she seems to be doing well minimizing emotional eating behaviors.  4. At risk for heart disease Brandy Saunders is at a higher than average risk for cardiovascular disease due to obesity.   Assessment/Plan:   1. Essential hypertension Brandy Saunders is working on healthy weight loss and exercise to improve blood pressure control. We will watch for signs of hypotension as she continues her lifestyle  modifications. We will refill metoprolol for 1 month.  - metoprolol succinate (TOPROL-XL) 100 MG 24 hr tablet; Take 1 tablet (100 mg total) by mouth daily. Take with or immediately following a meal.  Dispense: 30 tablet; Refill: 0  2. Vitamin D deficiency Low Vitamin D level contributes to fatigue and are associated with obesity, breast, and colon cancer. We will refill prescription Vitamin D for 1 month. Brandy Saunders will follow-up for routine testing of Vitamin D, at least 2-3 times per year to avoid over-replacement.  - Vitamin D, Ergocalciferol, (DRISDOL) 1.25 MG (50000 UNIT) CAPS capsule; Take 1 capsule (50,000 Units total) by mouth every 7 (seven) days.  Dispense: 4 capsule; Refill: 0  3. Other depression, with emotional eating Behavior modification techniques were discussed today to help [name] deal with her emotional/non-hunger eating behaviors. We will refill Wellbutrin SR for 1 month. Orders and follow up as documented in patient record.   - buPROPion (WELLBUTRIN SR) 200 MG 12 hr tablet; Take 1 tablet (200 mg total) by mouth daily.  Dispense: 30 tablet; Refill: 0  4. At risk for heart disease Brandy Saunders was given approximately 15 minutes of coronary artery disease prevention counseling today. She is 30 y.o. female and has risk factors for heart disease including obesity. We discussed intensive lifestyle modifications today with an emphasis on specific weight loss instructions and strategies.   Repetitive spaced learning was employed today to elicit superior memory formation and behavioral change.  5. Class 3 severe obesity with serious comorbidity and body mass index (BMI) greater than or equal to 70 in adult, unspecified obesity type (HCC) Brandy Saunders is currently in the action stage of  change. As such, her goal is to continue with weight loss efforts. She has agreed to keeping a food journal and adhering to recommended goals of 1800 calories and 125 grams of protein daily.   We discussed  various medication options to help Brandy Saunders with her weight loss efforts and we both agreed to continue Saxenda and we will refill for 1 month and increase needle length (ok to decrease to 0.6 mg, if she gets better results at this dose).  - Liraglutide -Weight Management (SAXENDA) 18 MG/3ML SOPN; INJECT 3 MG DOSE INTO THE SKIN DAILY  Dispense: 15 mL; Refill: 0  Exercise goals: As is.  Behavioral modification strategies: increasing lean protein intake and meal planning and cooking strategies.  Brandy Saunders has agreed to follow-up with our clinic in 3 weeks. She was informed of the importance of frequent follow-up visits to maximize her success with intensive lifestyle modifications for her multiple health conditions.  Objective:   VITALS: Per patient if applicable, see vitals. GENERAL: Alert and in no acute distress. CARDIOPULMONARY: No increased WOB. Speaking in clear sentences.  PSYCH: Pleasant and cooperative. Speech normal rate and rhythm. Affect is appropriate. Insight and judgement are appropriate. Attention is focused, linear, and appropriate.  NEURO: Oriented as arrived to appointment on time with no prompting.   Lab Results  Component Value Date   CREATININE 0.90 07/18/2020   BUN 13 07/18/2020   NA 137 07/18/2020   K 3.9 07/18/2020   CL 104 07/18/2020   CO2 24 07/18/2020   Lab Results  Component Value Date   ALT 38 07/18/2020   AST 26 07/18/2020   ALKPHOS 48 07/18/2020   BILITOT 0.7 07/18/2020   Lab Results  Component Value Date   HGBA1C 5.6 12/03/2019   HGBA1C 5.8 (H) 06/17/2019   HGBA1C 5.8 (H) 05/02/2018   HGBA1C 5.7 (H) 01/17/2018   HGBA1C 6.0 (H) 10/11/2017   Lab Results  Component Value Date   INSULIN 30.8 (H) 12/03/2019   INSULIN 35.9 (H) 06/17/2019   INSULIN 30.3 (H) 05/02/2018   INSULIN 39.5 (H) 01/17/2018   INSULIN 73.6 (H) 10/11/2017   Lab Results  Component Value Date   TSH 1.90 02/26/2020   Lab Results  Component Value Date   CHOL 165  02/26/2020   HDL 57.50 02/26/2020   LDLCALC 87 02/26/2020   TRIG 104.0 02/26/2020   CHOLHDL 3 02/26/2020   Lab Results  Component Value Date   WBC 5.6 07/18/2020   HGB 13.8 07/18/2020   HCT 42.5 07/18/2020   MCV 86.4 07/18/2020   PLT 234 07/18/2020   No results found for: IRON, TIBC, FERRITIN  Attestation Statements:   Reviewed by clinician on day of visit: allergies, medications, problem list, medical history, surgical history, family history, social history, and previous encounter notes.   I, Trixie Dredge, am acting as transcriptionist for Dennard Nip, MD.  I have reviewed the above documentation for accuracy and completeness, and I agree with the above. - Dennard Nip, MD

## 2020-09-18 DIAGNOSIS — F4320 Adjustment disorder, unspecified: Secondary | ICD-10-CM | POA: Diagnosis not present

## 2020-10-01 ENCOUNTER — Other Ambulatory Visit: Payer: Self-pay

## 2020-10-01 ENCOUNTER — Ambulatory Visit
Admission: RE | Admit: 2020-10-01 | Discharge: 2020-10-01 | Disposition: A | Discharge status: Home / Self Care | Payer: BC Managed Care – PPO | Source: Ambulatory Visit | Attending: Obstetrics & Gynecology | Admitting: Obstetrics & Gynecology

## 2020-10-01 DIAGNOSIS — R928 Other abnormal and inconclusive findings on diagnostic imaging of breast: Secondary | ICD-10-CM | POA: Diagnosis not present

## 2020-10-01 DIAGNOSIS — N6331 Unspecified lump in axillary tail of the right breast: Secondary | ICD-10-CM

## 2020-10-01 DIAGNOSIS — N6489 Other specified disorders of breast: Secondary | ICD-10-CM | POA: Diagnosis not present

## 2020-10-01 DIAGNOSIS — F4320 Adjustment disorder, unspecified: Secondary | ICD-10-CM | POA: Diagnosis not present

## 2020-10-07 ENCOUNTER — Other Ambulatory Visit: Payer: Self-pay

## 2020-10-07 ENCOUNTER — Ambulatory Visit (INDEPENDENT_AMBULATORY_CARE_PROVIDER_SITE_OTHER): Payer: BC Managed Care – PPO | Admitting: Family Medicine

## 2020-10-07 ENCOUNTER — Encounter (INDEPENDENT_AMBULATORY_CARE_PROVIDER_SITE_OTHER): Payer: Self-pay | Admitting: Family Medicine

## 2020-10-07 VITALS — BP 120/79 | HR 85 | Temp 97.7°F | Ht 65.0 in | Wt >= 6400 oz

## 2020-10-07 DIAGNOSIS — Z9189 Other specified personal risk factors, not elsewhere classified: Secondary | ICD-10-CM

## 2020-10-07 DIAGNOSIS — I10 Essential (primary) hypertension: Secondary | ICD-10-CM

## 2020-10-07 DIAGNOSIS — E559 Vitamin D deficiency, unspecified: Secondary | ICD-10-CM | POA: Diagnosis not present

## 2020-10-07 DIAGNOSIS — R600 Localized edema: Secondary | ICD-10-CM

## 2020-10-07 DIAGNOSIS — F3289 Other specified depressive episodes: Secondary | ICD-10-CM | POA: Diagnosis not present

## 2020-10-07 DIAGNOSIS — Z6841 Body Mass Index (BMI) 40.0 and over, adult: Secondary | ICD-10-CM

## 2020-10-07 MED ORDER — METOPROLOL SUCCINATE ER 100 MG PO TB24: 100.0000 mg | ORAL_TABLET | Freq: Every day | ORAL | 0 refills | Status: DC

## 2020-10-07 MED ORDER — BUPROPION HCL ER (SR) 200 MG PO TB12: 200.0000 mg | ORAL_TABLET | Freq: Every day | ORAL | 0 refills | Status: DC

## 2020-10-07 MED ORDER — VITAMIN D (ERGOCALCIFEROL) 1.25 MG (50000 UNIT) PO CAPS: 50000.0000 [IU] | ORAL_CAPSULE | ORAL | 0 refills | Status: DC

## 2020-10-07 NOTE — Progress Notes (Signed)
Chief Complaint:   OBESITY Brandy Saunders is here to discuss her progress with her obesity treatment plan along with follow-up of her obesity related diagnoses. Brandy Saunders is on keeping a food journal and adhering to recommended goals of 1800 calories and 125 grams of protein daily and states she is following her eating plan approximately 50% of the time. Brandy Saunders states she is doing 0 minutes 0 times per week.  Today's visit was #: 69 Starting weight: 447 lbs Starting date: 10/11/2017 Today's weight: 456 lbs Today's date: 10/07/2020 Total lbs lost to date: 0 Total lbs lost since last in-office visit: 0  Interim History: Brandy Saunders feels she is retaining fluid today. She is tolerating Saxenda most of the time, but sometimes notes increased hunger soon after taking it.  Subjective:   1. Bilateral leg edema Brandy Saunders notes increased AM, bilateral lower extremity edema in the last 1-2 days. She normally has swelling later in the day, and this is new. She denies chest pain or worsening shortness of breath.  2. Vitamin D deficiency Brandy Saunders stable on Vit D, and requests a refill. She denies change in fatigue.  3. Essential hypertension Brandy Saunders's blood pressure is well controlled today on medications. She continues to work on diet and exercise.  4. Other depression, with emotional eating Brandy Saunders's mood is stable on her medications. She is working on strategies to decrease emotional eating behaviors. Her blood pressure is stable.  5. At risk for heart disease Brandy Saunders is at a higher than average risk for cardiovascular disease due to obesity.   Assessment/Plan:   1. Bilateral leg edema Brandy Saunders agreed to start chlorthalidone 12.5 mg q AM with no refills. She will continue to follow up as directed.  2. Vitamin D deficiency Low Vitamin D level contributes to fatigue and are associated with obesity, breast, and colon cancer. We will refill prescription Vitamin D for 1 month. Brandy Saunders will  follow-up for routine testing of Vitamin D, at least 2-3 times per year to avoid over-replacement.  - Vitamin D, Ergocalciferol, (DRISDOL) 1.25 MG (50000 UNIT) CAPS capsule; Take 1 capsule (50,000 Units total) by mouth every 7 (seven) days.  Dispense: 4 capsule; Refill: 0  3. Essential hypertension Brandy Saunders is working on healthy weight loss and exercise to improve blood pressure control. We will watch for signs of hypotension as she continues her lifestyle modifications. We will refill metoprolol for 1 month.  - metoprolol succinate (TOPROL-XL) 100 MG 24 hr tablet; Take 1 tablet (100 mg total) by mouth daily. Take with or immediately following a meal.  Dispense: 30 tablet; Refill: 0  4. Other depression, with emotional eating Behavior modification techniques were discussed today to help Lanyah deal with her emotional/non-hunger eating behaviors. We will refill Brandy Saunders SR for 1 month. Orders and follow up as documented in patient record.   - buPROPion (Brandy Saunders SR) 200 MG 12 hr tablet; Take 1 tablet (200 mg total) by mouth daily.  Dispense: 30 tablet; Refill: 0  5. At risk for heart disease Gentry was given approximately 15 minutes of coronary artery disease prevention counseling today. She is 30 y.o. female and has risk factors for heart disease including obesity. We discussed intensive lifestyle modifications today with an emphasis on specific weight loss instructions and strategies.   Repetitive spaced learning was employed today to elicit superior memory formation and behavioral change.  6. Class 3 severe obesity with serious comorbidity and body mass index (BMI) greater than or equal to 70 in adult, unspecified obesity type (  [place]) Threasa is currently in the action stage of change. As such, her goal is to continue with weight loss efforts. She has agreed to keeping a food journal and adhering to recommended goals of 1800 calories and 125+ grams of protein daily.   We discussed  various medication options to help Tarika with her weight loss efforts and we both agreed that Lasonya is ok to increase Saxenda to as high as 2.4 mg.  Recipes were given today.  Exercise goals: All adults should avoid inactivity. Some physical activity is better than none, and adults who participate in any amount of physical activity gain some health benefits.  Behavioral modification strategies: increasing water intake.  Sanii has agreed to follow-up with our clinic in 3 weeks. She was informed of the importance of frequent follow-up visits to maximize her success with intensive lifestyle modifications for her multiple health conditions.   Objective:   Blood pressure 120/79, pulse 85, temperature 97.7 F (36.5 C), height 5\' 5"  (1.651 m), weight (!) 456 lb (206.8 kg), SpO2 99 %. Body mass index is 75.88 kg/m.  General: Cooperative, alert, well developed, in no acute distress. HEENT: Conjunctivae and lids unremarkable. Cardiovascular: Regular rhythm.  Lungs: Normal work of breathing. Neurologic: No focal deficits.   Lab Results  Component Value Date   CREATININE 0.90 07/18/2020   BUN 13 07/18/2020   NA 137 07/18/2020   K 3.9 07/18/2020   CL 104 07/18/2020   CO2 24 07/18/2020   Lab Results  Component Value Date   ALT 38 07/18/2020   AST 26 07/18/2020   ALKPHOS 48 07/18/2020   BILITOT 0.7 07/18/2020   Lab Results  Component Value Date   HGBA1C 5.6 12/03/2019   HGBA1C 5.8 (H) 06/17/2019   HGBA1C 5.8 (H) 05/02/2018   HGBA1C 5.7 (H) 01/17/2018   HGBA1C 6.0 (H) 10/11/2017   Lab Results  Component Value Date   INSULIN 30.8 (H) 12/03/2019   INSULIN 35.9 (H) 06/17/2019   INSULIN 30.3 (H) 05/02/2018   INSULIN 39.5 (H) 01/17/2018   INSULIN 73.6 (H) 10/11/2017   Lab Results  Component Value Date   TSH 1.90 02/26/2020   Lab Results  Component Value Date   CHOL 165 02/26/2020   HDL 57.50 02/26/2020   LDLCALC 87 02/26/2020   TRIG 104.0 02/26/2020   CHOLHDL 3  02/26/2020   Lab Results  Component Value Date   WBC 5.6 07/18/2020   HGB 13.8 07/18/2020   HCT 42.5 07/18/2020   MCV 86.4 07/18/2020   PLT 234 07/18/2020   No results found for: IRON, TIBC, FERRITIN  Attestation Statements:   Reviewed by clinician on day of visit: allergies, medications, problem list, medical history, surgical history, family history, social history, and previous encounter notes.   I, Trixie Dredge, am acting as transcriptionist for Dennard Nip, MD.  I have reviewed the above documentation for accuracy and completeness, and I agree with the above. -  Dennard Nip, MD

## 2020-10-08 ENCOUNTER — Other Ambulatory Visit (INDEPENDENT_AMBULATORY_CARE_PROVIDER_SITE_OTHER): Payer: Self-pay | Admitting: Family Medicine

## 2020-10-08 DIAGNOSIS — I10 Essential (primary) hypertension: Secondary | ICD-10-CM

## 2020-10-09 DIAGNOSIS — F4320 Adjustment disorder, unspecified: Secondary | ICD-10-CM | POA: Diagnosis not present

## 2020-10-11 ENCOUNTER — Encounter (INDEPENDENT_AMBULATORY_CARE_PROVIDER_SITE_OTHER): Payer: Self-pay | Admitting: Family Medicine

## 2020-10-13 ENCOUNTER — Other Ambulatory Visit (INDEPENDENT_AMBULATORY_CARE_PROVIDER_SITE_OTHER): Payer: Self-pay | Admitting: Family Medicine

## 2020-10-13 DIAGNOSIS — R600 Localized edema: Secondary | ICD-10-CM

## 2020-10-13 DIAGNOSIS — Z6841 Body Mass Index (BMI) 40.0 and over, adult: Secondary | ICD-10-CM

## 2020-10-13 NOTE — Telephone Encounter (Signed)
Last OV with Dr. Beasley 

## 2020-10-13 NOTE — Telephone Encounter (Signed)
MyChart message sent to pt to find out if they have enough medication to get them through until next appt.   

## 2020-10-13 NOTE — Telephone Encounter (Signed)
yes

## 2020-10-14 ENCOUNTER — Telehealth: Payer: Self-pay

## 2020-10-14 NOTE — Telephone Encounter (Signed)
Ok with me 

## 2020-10-14 NOTE — Telephone Encounter (Signed)
Pt calling to request changing providers.  Pt would like to switch to Dr. Bryan Lemma.  Please see message and advise.

## 2020-10-18 ENCOUNTER — Other Ambulatory Visit (INDEPENDENT_AMBULATORY_CARE_PROVIDER_SITE_OTHER): Payer: Self-pay

## 2020-10-18 DIAGNOSIS — R600 Localized edema: Secondary | ICD-10-CM

## 2020-10-18 DIAGNOSIS — K219 Gastro-esophageal reflux disease without esophagitis: Secondary | ICD-10-CM | POA: Diagnosis not present

## 2020-10-18 DIAGNOSIS — G43009 Migraine without aura, not intractable, without status migrainosus: Secondary | ICD-10-CM | POA: Diagnosis not present

## 2020-10-18 DIAGNOSIS — J45909 Unspecified asthma, uncomplicated: Secondary | ICD-10-CM | POA: Diagnosis not present

## 2020-10-18 DIAGNOSIS — R0602 Shortness of breath: Secondary | ICD-10-CM | POA: Diagnosis not present

## 2020-10-18 DIAGNOSIS — R0789 Other chest pain: Secondary | ICD-10-CM | POA: Diagnosis not present

## 2020-10-18 MED ORDER — CHLORTHALIDONE 25 MG PO TABS: ORAL_TABLET | ORAL | 0 refills | Status: DC

## 2020-10-18 NOTE — Telephone Encounter (Signed)
Lm to call office back to schedule appt.

## 2020-10-26 ENCOUNTER — Other Ambulatory Visit: Payer: Self-pay

## 2020-10-26 ENCOUNTER — Encounter: Payer: Self-pay | Admitting: Allergy & Immunology

## 2020-10-26 ENCOUNTER — Ambulatory Visit (INDEPENDENT_AMBULATORY_CARE_PROVIDER_SITE_OTHER): Payer: BC Managed Care – PPO | Admitting: Allergy & Immunology

## 2020-10-26 ENCOUNTER — Telehealth (INDEPENDENT_AMBULATORY_CARE_PROVIDER_SITE_OTHER): Payer: Self-pay

## 2020-10-26 VITALS — BP 132/78 | HR 78 | Temp 98.4°F | Resp 18 | Ht 65.0 in | Wt >= 6400 oz

## 2020-10-26 DIAGNOSIS — J4530 Mild persistent asthma, uncomplicated: Secondary | ICD-10-CM

## 2020-10-26 DIAGNOSIS — R21 Rash and other nonspecific skin eruption: Secondary | ICD-10-CM | POA: Diagnosis not present

## 2020-10-26 DIAGNOSIS — J3089 Other allergic rhinitis: Secondary | ICD-10-CM | POA: Diagnosis not present

## 2020-10-26 DIAGNOSIS — K219 Gastro-esophageal reflux disease without esophagitis: Secondary | ICD-10-CM | POA: Diagnosis not present

## 2020-10-26 DIAGNOSIS — J302 Other seasonal allergic rhinitis: Secondary | ICD-10-CM

## 2020-10-26 MED ORDER — FAMOTIDINE 40 MG PO TABS: 40.0000 mg | ORAL_TABLET | Freq: Two times a day (BID) | ORAL | 5 refills | Status: DC | PRN

## 2020-10-26 MED ORDER — CETIRIZINE HCL 10 MG PO TABS: 10.0000 mg | ORAL_TABLET | Freq: Every day | ORAL | 5 refills | Status: DC

## 2020-10-26 MED ORDER — ALVESCO 160 MCG/ACT IN AERS: 1.0000 | INHALATION_SPRAY | Freq: Two times a day (BID) | RESPIRATORY_TRACT | 5 refills | Status: DC

## 2020-10-26 NOTE — Progress Notes (Signed)
FOLLOW UP  Date of Service/Encounter:  10/26/20   Assessment:   Rash  SOB (shortness of breath) - likely asthma  GERD - recommended taking famotidine BID when the symptoms start (patient prefers to not taking something on a daily basis)  Plan/Recommendations:   1. Mild persistent asthma, uncomplicated - Lung testing looked stable. - We did give you albuterol to see if there was improvement on the breathing test and there indeed was, although not markedly so. - We are going to start Alvesco one puff twice daily to see if that helps with asthma symptoms. - In the future, if you feel that you are having these night time symptoms, start Pepcid (famotidine) 40mg  twice daily (this is more meant to be used AS NEEDED compared to the Protonix that you have now, which should be used more consistently for the best results).  - This might answer whether reflux is related to this.  - Spacer and demonstration provided. - Spacer sample and demonstration provided. - Daily controller medication(s): Alvesco 126mcg one puff twice daily - Prior to physical activity: albuterol 2 puffs 10-15 minutes before physical activity. - Rescue medications: albuterol 4 puffs every 4-6 hours as needed - Asthma control goals:  * Full participation in all desired activities (may need albuterol before activity) * Albuterol use two time or less a week on average (not counting use with activity) * Cough interfering with sleep two time or less a month * Oral steroids no more than once a year * No hospitalizations  2. Seasonal and perennial allergic rhinitis (dust mites, cat, dog, grasses, and molds) - Start Zyrtec (cetirizine) 10mg  once daily to see if this can help too.    3. Rash - Continue to take pictures of the rash when it happens.  4. Return in about 4 weeks (around 11/23/2020).   Subjective:   Brandy Saunders is a 30 y.o. female presenting today for follow up of  Chief Complaint  Patient  presents with  . Rash    Has not had a flare since last visit nov.-dec.  Shortness, fatigue,headache, and nausea - started the week if valentines     [name] has a history of the following: Patient Active Problem List   Diagnosis Date Noted  . Allergies 07/26/2020  . Lymphedema 05/05/2020  . Vitamin D deficiency 01/22/2020  . Rash 12/16/2019  . Nevus 12/16/2019  . Depression 10/19/2019  . Hyperlipidemia 09/18/2019  . Spasm of thoracic back muscle 09/18/2019  . Palpitations 08/30/2018  . Obstructive sleep apnea 08/30/2018  . Pain of left calf 12/14/2017  . Other fatigue 10/11/2017  . Shortness of breath on exertion 10/11/2017  . Prediabetes 10/11/2017  . Chronic left-sided thoracic back pain 05/23/2017  . Acute left-sided thoracic back pain 05/09/2017  . Migraine 05/09/2017  . Essential hypertension 05/09/2017  . Common migraine with intractable migraine 02/02/2017  . Chronic fatigue 01/06/2016  . Fatty liver 02/15/2015  . Esophageal reflux 02/15/2015  . Late menses 07/30/2014  . Class 3 severe obesity with serious comorbidity and body mass index (BMI) greater than or equal to 70 in adult [facility]) 07/30/2014  . Asthma without acute exacerbation 07/30/2014  . IBS (irritable bowel syndrome) 07/30/2014  . Elevated BP 07/30/2014  . PCOS (polycystic ovarian syndrome) 11/25/2012  . Carpal tunnel syndrome, bilateral     History obtained from: chart review and patient.  Brandy Saunders is a 30 y.o. female presenting for a follow up visit.  She was last seen  in December 2021 for evaluation of a rash.  She also had shortness of breath but her spiral looked great.  We will continue with albuterol as needed.  For her rash, we obtained quite a bit of lab work.  All of her lab work was largely normal.  Her environmental allergy panel came back positive for dust mites, cat, dog, grasses, and molds.   Since the last visit ,she has done well. She does report nausea, headaches, and SOB  episodes. This has been ongoing since Valentine's Day. She thinks that the rash and the breathing go hand-in-hand. But her rash has not been an issue since the last visit, although she is expecting it to become a problem any day now.   Asthma/Respiratory Symptom History: She reports some itching over the neck area but the rash has been non-existent. She has bene having SOB and difficulty with deep breathing. She started feeling better last Thursday but it has been 7-10 days of feeling bad. She did not get steroids. She has never been on anything other than albuterol. She did not feel much of a difference with the the SOB episodes. She never really felt better with this. There is enough tightness that she is concerned with it. Currently it is better for the past 2-3 days.    VaporRub and sleeping on the cough helped. She does feel that she has episodes of heart burn but "nothing serious". She was prescribed Protonix but she never took it because she felt better. She did develop some burning and nausea in her abdomen that it now gone. She is unsure of the dosing of the Protonix. She feels that she is trying to avoid adding medications to her regimen.   Allergic Rhinitis Symptom History: She really does not have anything in the way of allergic rhinitis symptoms.  She does not take an antihistamine on a regular basis, but is very open to it in case all of this is related to allergic triggers. She just does not want something that might interefere with her blood pressure.   Otherwise, there have been no changes to her past medical history, surgical history, family history, or social history.    Review of Systems  Constitutional: Negative.  Negative for chills, fever, malaise/fatigue and weight loss.  HENT: Negative for congestion, ear discharge, ear pain and sinus pain.   Eyes: Negative for pain, discharge and redness.  Respiratory: Positive for cough, shortness of breath and wheezing. Negative for sputum  production.   Cardiovascular: Negative.  Negative for chest pain and palpitations.  Gastrointestinal: Negative for abdominal pain, constipation, diarrhea, heartburn, nausea and vomiting.  Skin: Negative.  Negative for itching and rash.  Neurological: Negative for dizziness and headaches.  Endo/Heme/Allergies: Positive for environmental allergies. Does not bruise/bleed easily.       Objective:   Blood pressure 132/78, pulse 78, temperature 98.4 F (36.9 C), resp. rate 18, height 5\' 5"  (1.651 m), weight (!) 459 lb 6.4 oz (208.4 kg), SpO2 95 %. Body mass index is 76.45 kg/m.   Physical Exam:  Physical Exam Constitutional:      Appearance: She is well-developed. She is morbidly obese.  HENT:     Head: Normocephalic and atraumatic.     Right Ear: Tympanic membrane, ear canal and external ear normal.     Left Ear: Tympanic membrane, ear canal and external ear normal.     Nose: No nasal deformity, septal deviation, mucosal edema, rhinorrhea or epistaxis.     Right Turbinates:  Swollen and pale.     Left Turbinates: Pale.     Right Sinus: No maxillary sinus tenderness or frontal sinus tenderness.     Left Sinus: No maxillary sinus tenderness or frontal sinus tenderness.     Mouth/Throat:     Mouth: Oropharynx is clear and moist. Mucous membranes are not pale and not dry.     Pharynx: Uvula midline.  Eyes:     General:        Right eye: No discharge.        Left eye: No discharge.     Extraocular Movements: EOM normal.     Conjunctiva/sclera: Conjunctivae normal.     Right eye: Right conjunctiva is not injected. No chemosis.    Left eye: Left conjunctiva is not injected. No chemosis.    Pupils: Pupils are equal, round, and reactive to light.  Cardiovascular:     Rate and Rhythm: Normal rate and regular rhythm.     Heart sounds: Normal heart sounds.  Pulmonary:     Effort: Pulmonary effort is normal. No tachypnea, accessory muscle usage or respiratory distress.     Breath  sounds: Normal breath sounds. No wheezing, rhonchi or rales.     Comments: Moving air well in all lung fields. No increased work of breathing noted. Difficult to fully evaluate due to body habitus.  Chest:     Chest wall: No tenderness.  Lymphadenopathy:     Cervical: No cervical adenopathy.  Skin:    Coloration: Skin is not pale.     Findings: No abrasion, erythema, petechiae or rash. Rash is not papular, urticarial or vesicular.  Neurological:     Mental Status: She is alert.  Psychiatric:        Mood and Affect: Mood and affect normal.        Behavior: Behavior is cooperative.      Diagnostic studies:   Spirometry: results normal (FEV1: 2.45/84%, FVC: 3.10/90%, FEV1/FVC: 79%).    Spirometry consistent with normal pattern. Albuterol four puffs via MDI treatment given in clinic with improvement in FEV1 and FVC, but not significant per ATS criteria. She did have some improvement in her FEF 25-75% of 20%.   Allergy Studies: none     Salvatore Marvel, MD  [facility] [place]

## 2020-10-26 NOTE — Patient Instructions (Addendum)
1. Mild persistent asthma, uncomplicated - Lung testing looked stable. - We did give you albuterol to see if there was improvement on the breathing test and there indeed was, although not markedly so. - We are going to start Alvesco one puff twice daily to see if that helps with asthma symptoms. - In the future, if you feel that you are having these night time symptoms, start Pepcid (famotidine) 40mg  twice daily (this is more meant to be used AS NEEDED compared to the Protonix that you have now, which should be used more consistently for the best results).  - This might answer whether reflux is related to this.  - Spacer and demonstration provided. - Spacer sample and demonstration provided. - Daily controller medication(s): Alvesco 138mcg one puff twice daily - Prior to physical activity: albuterol 2 puffs 10-15 minutes before physical activity. - Rescue medications: albuterol 4 puffs every 4-6 hours as needed - Asthma control goals:  * Full participation in all desired activities (may need albuterol before activity) * Albuterol use two time or less a week on average (not counting use with activity) * Cough interfering with sleep two time or less a month * Oral steroids no more than once a year * No hospitalizations  2. Seasonal and perennial allergic rhinitis (dust mites, cat, dog, grasses, and molds) - Start Zyrtec (cetirizine) 10mg  once daily to see if this can help too.    3. Rash - Continue to take pictures of the rash when it happens.  4. Return in about 4 weeks (around 11/23/2020).    Please inform us of any Emergency Department visits, hospitalizations, or changes in symptoms. Call us before going to the ED for breathing or allergy symptoms since we might be able to fit you in for a sick visit. Feel free to contact us anytime with any questions, problems, or concerns.  It was a pleasure to see you again today!  Websites that have reliable patient information: 1. American  Academy of Asthma, Allergy, and Immunology: www.aaaai.org 2. Food Allergy Research and Education (FARE): foodallergy.org 3. Mothers of Asthmatics: http://www.asthmacommunitynetwork.org 4. American College of Allergy, Asthma, and Immunology: www.acaai.org   COVID-19 Vaccine Information can be found at: ShippingScam.[email or web address].uk For questions related to vaccine distribution or appointments, please email [email or web address] or call [phone].   We realize that you might be concerned about having an allergic reaction to the COVID19 vaccines. To help with that concern, WE ARE OFFERING THE COVID19 VACCINES IN OUR OFFICE! Ask the front desk for dates!     "Like" [place] on Facebook and Instagram for our latest updates!      A healthy democracy works best when [employer] participate! Make sure you are registered to vote! If you have moved or changed any of your contact information, you will need to get this updated before voting!  In some cases, you MAY be able to register to vote online: [email or web address]

## 2020-10-26 NOTE — Telephone Encounter (Signed)
Your prior authorization for Brandy Saunders has been approved!  For eligible patients, copay assistance may be available. To learn more and be redirected to the [facility] website, click on the "More Info" button to the right. Please also note that you may need to schedule a follow-up visit with your patient prior to the expiration of this prior authorization, as updated patient weight may be required for reauthorization.  Message from plan: CaseId:[id number];Status:Approved;Review Type:Prior Auth;Coverage Start Date:09/26/2020;Coverage End Date:02/23/2021;

## 2020-10-26 NOTE — Telephone Encounter (Signed)
PA has been initiated via [email or web address] for Saxenda 18mg /76mL.  Key: Brandy Saunders 18MG Brandy Saunders pen-injectors   Form: [employer] Electronic PA Form (2017 NCPDP)  Determination: Wait for Determination Please wait for [employer] 2017 to return a determination.

## 2020-10-27 ENCOUNTER — Telehealth: Payer: Self-pay

## 2020-10-27 NOTE — Telephone Encounter (Signed)
Alvesco is not covered by patient insurance please consider alternative medication since no documentation tired and fail medication.  FLOVENT HFA INH AEROSOL 44MCG  FLOVENT HFA INH AEROSOL 12GM 110MCG  FLOVENT HFA INH AEROSOL 12GM 220MCG  ASMANEX TWISTHALER 30 DOSES 220MCG  ASMANEX TWISTHALER 60 DOSES 220MCG  ASMANEX TWISTHALER 120 DOSES 220MCG  FLOVENT DISKUS (60DOSE) 50MCG  ASMANEX TWISTHALER 110MCG

## 2020-10-27 NOTE — Telephone Encounter (Signed)
I thought ALVESCO was covered not matter what with the copay card.   Please advise.  Brandy Marvel, MD [facility] [place]

## 2020-10-28 ENCOUNTER — Other Ambulatory Visit: Payer: Self-pay

## 2020-10-28 ENCOUNTER — Encounter (INDEPENDENT_AMBULATORY_CARE_PROVIDER_SITE_OTHER): Payer: Self-pay | Admitting: Family Medicine

## 2020-10-28 ENCOUNTER — Ambulatory Visit (INDEPENDENT_AMBULATORY_CARE_PROVIDER_SITE_OTHER): Payer: BC Managed Care – PPO | Admitting: Family Medicine

## 2020-10-28 VITALS — BP 113/71 | HR 83 | Temp 98.0°F | Ht 65.0 in | Wt >= 6400 oz

## 2020-10-28 DIAGNOSIS — E559 Vitamin D deficiency, unspecified: Secondary | ICD-10-CM

## 2020-10-28 DIAGNOSIS — Z9189 Other specified personal risk factors, not elsewhere classified: Secondary | ICD-10-CM

## 2020-10-28 DIAGNOSIS — F3289 Other specified depressive episodes: Secondary | ICD-10-CM

## 2020-10-28 DIAGNOSIS — I10 Essential (primary) hypertension: Secondary | ICD-10-CM | POA: Diagnosis not present

## 2020-10-28 DIAGNOSIS — Z6841 Body Mass Index (BMI) 40.0 and over, adult: Secondary | ICD-10-CM

## 2020-10-28 MED ORDER — BUPROPION HCL ER (SR) 200 MG PO TB12: 200.0000 mg | ORAL_TABLET | Freq: Every day | ORAL | 0 refills | Status: DC

## 2020-10-28 MED ORDER — CHLORTHALIDONE 25 MG PO TABS
ORAL_TABLET | ORAL | 0 refills | Status: DC
Start: 2020-10-28 — End: 2020-12-06

## 2020-10-28 MED ORDER — VITAMIN D (ERGOCALCIFEROL) 1.25 MG (50000 UNIT) PO CAPS: 50000.0000 [IU] | ORAL_CAPSULE | ORAL | 0 refills | Status: DC

## 2020-10-28 MED ORDER — METOPROLOL SUCCINATE ER 100 MG PO TB24: 100.0000 mg | ORAL_TABLET | Freq: Every day | ORAL | 0 refills | Status: DC

## 2020-10-28 NOTE — Telephone Encounter (Signed)
I attached a coupon when I sent in the prescription to the pharmacy. I will call later today and see what the pharmacy says before we change inhalers.

## 2020-10-29 DIAGNOSIS — F4320 Adjustment disorder, unspecified: Secondary | ICD-10-CM | POA: Diagnosis not present

## 2020-10-29 NOTE — Telephone Encounter (Signed)
Noted!  Thanks for taking care of.  Salvatore Marvel, MD [facility] [place]

## 2020-10-30 DIAGNOSIS — F4320 Adjustment disorder, unspecified: Secondary | ICD-10-CM | POA: Diagnosis not present

## 2020-11-01 NOTE — Progress Notes (Signed)
Chief Complaint:   OBESITY Brandy Saunders is here to discuss her progress with her obesity treatment plan along with follow-up of her obesity related diagnoses. Brandy Saunders is on the Category 2 Plan and states she is following her eating plan approximately 70% of the time. Brandy Saunders states she is doing 0 minutes 0 times per week.  Today's visit was #: 50 Starting weight: 447 lbs Starting date: 10/11/2017 Today's weight: 453 lbs Today's date: 10/28/2020 Total lbs lost to date: 0 Total lbs lost since last in-office visit: 3  Interim History: Brandy Saunders has done very well with weight loss. She is back on track with journaling and working on increasing lean protein. Her hunger is controlled and she tolerates her Saxenda well overall.  Subjective:   1. Vitamin D deficiency Brandy Saunders is stable on Vit D, and she denies nausea or vomiting. She is needing a refill today.  2. Essential hypertension Brandy Saunders's blood pressure is well controlled on her medications, and with diet and weight loss.  3. Other depression, with emotional eating Brandy Saunders's mood is improving with the onset of Spring and decreased SAD symptoms. She is mindful of her emotional eating behaviors, and she is working on ways to decrease this.  4. At risk for heart disease Brandy Saunders is at a higher than average risk for cardiovascular disease due to obesity.   Assessment/Plan:   1. Vitamin D deficiency Low Vitamin D level contributes to fatigue and are associated with obesity, breast, and colon cancer. We will refill prescription Vitamin D for 1 month. Brandy Saunders will follow-up for routine testing of Vitamin D, at least 2-3 times per year to avoid over-replacement.  - Vitamin D, Ergocalciferol, (DRISDOL) 1.25 MG (50000 UNIT) CAPS capsule; Take 1 capsule (50,000 Units total) by mouth every 7 (seven) days.  Dispense: 4 capsule; Refill: 0  2. Essential hypertension Brandy Saunders is working on healthy weight loss and exercise to improve blood  pressure control. We will watch for signs of hypotension as she continues her lifestyle modifications. We will refill both chlorthalidone and Toprol for 1 month.  - metoprolol succinate (TOPROL-XL) 100 MG 24 hr tablet; Take 1 tablet (100 mg total) by mouth daily. Take with or immediately following a meal.  Dispense: 30 tablet; Refill: 0 - chlorthalidone (HYGROTON) 25 MG tablet; Take 1/2 tablet by mouth once daily.  Dispense: 15 tablet; Refill: 0  3. Other depression, with emotional eating Behavior modification techniques were discussed today to help Brandy Saunders deal with her emotional/non-hunger eating behaviors. We will refill Wellbutrin SR for 1 month. Orders and follow up as documented in patient record.   - buPROPion (WELLBUTRIN SR) 200 MG 12 hr tablet; Take 1 tablet (200 mg total) by mouth daily.  Dispense: 30 tablet; Refill: 0  4. At risk for heart disease Brandy Saunders was given approximately 30 minutes of coronary artery disease prevention counseling today. She is 30 y.o. female and has risk factors for heart disease including obesity. We discussed intensive lifestyle modifications today with an emphasis on specific weight loss instructions and strategies.   Repetitive spaced learning was employed today to elicit superior memory formation and behavioral change.  5. Class 3 severe obesity with serious comorbidity and body mass index (BMI) greater than or equal to 70 in adult, unspecified obesity type (HCC) Brandy Saunders is currently in the action stage of change. As such, her goal is to continue with weight loss efforts. She has agreed to keeping a food journal and adhering to recommended goals of 2000 calories and  100 grams of protein daily.   We discussed various medication options to help Brandy Saunders with her weight loss efforts and we both agreed to continue Saxenda at 18. Mg, and will continue to follow up as directed.  Behavioral modification strategies: increasing lean protein intake and meal  planning and cooking strategies.  Brandy Saunders has agreed to follow-up with our clinic in 2 to 3 weeks. She was informed of the importance of frequent follow-up visits to maximize her success with intensive lifestyle modifications for her multiple health conditions.   Objective:   Blood pressure 113/71, pulse 83, temperature 98 F (36.7 C), height 5\' 5"  (1.651 m), weight (!) 453 lb (205.5 kg), SpO2 96 %. Body mass index is 75.38 kg/m.  General: Cooperative, alert, well developed, in no acute distress. HEENT: Conjunctivae and lids unremarkable. Cardiovascular: Regular rhythm.  Lungs: Normal work of breathing. Neurologic: No focal deficits.   Lab Results  Component Value Date   CREATININE 0.90 07/18/2020   BUN 13 07/18/2020   NA 137 07/18/2020   K 3.9 07/18/2020   CL 104 07/18/2020   CO2 24 07/18/2020   Lab Results  Component Value Date   ALT 38 07/18/2020   AST 26 07/18/2020   ALKPHOS 48 07/18/2020   BILITOT 0.7 07/18/2020   Lab Results  Component Value Date   HGBA1C 5.6 12/03/2019   HGBA1C 5.8 (H) 06/17/2019   HGBA1C 5.8 (H) 05/02/2018   HGBA1C 5.7 (H) 01/17/2018   HGBA1C 6.0 (H) 10/11/2017   Lab Results  Component Value Date   INSULIN 30.8 (H) 12/03/2019   INSULIN 35.9 (H) 06/17/2019   INSULIN 30.3 (H) 05/02/2018   INSULIN 39.5 (H) 01/17/2018   INSULIN 73.6 (H) 10/11/2017   Lab Results  Component Value Date   TSH 1.90 02/26/2020   Lab Results  Component Value Date   CHOL 165 02/26/2020   HDL 57.50 02/26/2020   LDLCALC 87 02/26/2020   TRIG 104.0 02/26/2020   CHOLHDL 3 02/26/2020   Lab Results  Component Value Date   WBC 5.6 07/18/2020   HGB 13.8 07/18/2020   HCT 42.5 07/18/2020   MCV 86.4 07/18/2020   PLT 234 07/18/2020   No results found for: IRON, TIBC, FERRITIN  Attestation Statements:   Reviewed by clinician on day of visit: allergies, medications, problem list, medical history, surgical history, family history, social history, and previous  encounter notes.   I, Trixie Dredge, am acting as transcriptionist for Dennard Nip, MD.  I have reviewed the above documentation for accuracy and completeness, and I agree with the above. -  Dennard Nip, MD

## 2020-11-04 ENCOUNTER — Encounter: Payer: Self-pay | Admitting: Allergy & Immunology

## 2020-11-05 NOTE — Telephone Encounter (Signed)
We were going to send in [place].  Isn't there a co-pay card associated with this?  Salvatore Marvel, MD [facility] [place]

## 2020-11-06 ENCOUNTER — Other Ambulatory Visit (INDEPENDENT_AMBULATORY_CARE_PROVIDER_SITE_OTHER): Payer: Self-pay | Admitting: Family Medicine

## 2020-11-06 DIAGNOSIS — I10 Essential (primary) hypertension: Secondary | ICD-10-CM

## 2020-11-11 ENCOUNTER — Other Ambulatory Visit (INDEPENDENT_AMBULATORY_CARE_PROVIDER_SITE_OTHER): Payer: Self-pay | Admitting: Family Medicine

## 2020-11-11 DIAGNOSIS — I10 Essential (primary) hypertension: Secondary | ICD-10-CM

## 2020-11-12 NOTE — Telephone Encounter (Signed)
[facility] called to check on a prior authorization for Alvesco. They can be reached at [phone].

## 2020-11-16 ENCOUNTER — Telehealth: Payer: Self-pay | Admitting: *Deleted

## 2020-11-16 ENCOUNTER — Ambulatory Visit (INDEPENDENT_AMBULATORY_CARE_PROVIDER_SITE_OTHER): Payer: BC Managed Care – PPO | Admitting: Family Medicine

## 2020-11-16 ENCOUNTER — Encounter (INDEPENDENT_AMBULATORY_CARE_PROVIDER_SITE_OTHER): Payer: Self-pay | Admitting: Family Medicine

## 2020-11-16 ENCOUNTER — Other Ambulatory Visit (INDEPENDENT_AMBULATORY_CARE_PROVIDER_SITE_OTHER): Payer: Self-pay | Admitting: Family Medicine

## 2020-11-16 ENCOUNTER — Other Ambulatory Visit: Payer: Self-pay

## 2020-11-16 VITALS — BP 100/67 | HR 78 | Temp 98.0°F | Ht 65.0 in | Wt >= 6400 oz

## 2020-11-16 DIAGNOSIS — F3289 Other specified depressive episodes: Secondary | ICD-10-CM

## 2020-11-16 DIAGNOSIS — Z6841 Body Mass Index (BMI) 40.0 and over, adult: Secondary | ICD-10-CM | POA: Diagnosis not present

## 2020-11-16 DIAGNOSIS — R600 Localized edema: Secondary | ICD-10-CM

## 2020-11-16 DIAGNOSIS — Z9189 Other specified personal risk factors, not elsewhere classified: Secondary | ICD-10-CM | POA: Diagnosis not present

## 2020-11-16 MED ORDER — BUPROPION HCL ER (SR) 150 MG PO TB12: 150.0000 mg | ORAL_TABLET | Freq: Two times a day (BID) | ORAL | 0 refills | Status: DC

## 2020-11-16 NOTE — Telephone Encounter (Signed)
Pa has been submitted thru cover my meds to pts insurance

## 2020-11-16 NOTE — Telephone Encounter (Signed)
Received fax from [place] stating this pt does not have RX benefits with [place] [employer] line of business. I believe the wrong form was submitted through cover meds. Morey Hummingbird please advise as I know you were working on this.

## 2020-11-16 NOTE — Telephone Encounter (Signed)
Thank you :)

## 2020-11-16 NOTE — Telephone Encounter (Signed)
Yes, cover my meds has it listed a non member. I tried to reach out to the pt on the number in chart but no on answered so left her a message to call me back to verify if she has insurance or not

## 2020-11-16 NOTE — Telephone Encounter (Signed)
She can still get alvesco 2 inhalers for 50 dollars with no insurance with the coupon card.

## 2020-11-16 NOTE — Telephone Encounter (Signed)
According to cover my meds no pa has been completed I will work on pa today

## 2020-11-16 NOTE — Telephone Encounter (Signed)
Caryl Pina hicks lpn talked with pt and nothing has changed in her insurance I will reach out to the [facility] and see about getting the alvesco covered.

## 2020-11-17 ENCOUNTER — Other Ambulatory Visit: Payer: Self-pay

## 2020-11-17 MED ORDER — ALVESCO 160 MCG/ACT IN AERS: 1.0000 | INHALATION_SPRAY | Freq: Two times a day (BID) | RESPIRATORY_TRACT | 5 refills | Status: DC

## 2020-11-17 NOTE — Telephone Encounter (Signed)
Will send it to the [facility] and inform pt of me doing so

## 2020-11-17 NOTE — Telephone Encounter (Signed)
Tried to call pt but was unable to reach her as the phone never actually rung

## 2020-11-18 NOTE — Progress Notes (Signed)
Chief Complaint:   OBESITY Brandy Saunders is here to discuss her progress with her obesity treatment plan along with follow-up of her obesity related diagnoses. Brandy Saunders is on keeping a food journal and adhering to recommended goals of 2000 calories and 100 grams of protein daily and states she is following her eating plan approximately 75% of the time. Brandy Saunders states she is walking the dog and stretching for 15 minutes 4 times per week.  Today's visit was #: 37 Starting weight: 447 lbs Starting date: 10/11/2017 Today's weight: 457 lbs Today's date: 11/16/2020 Total lbs lost to date: 0 Total lbs lost since last in-office visit: 0  Interim History: Brandy Saunders has been working on following her plan. She is increasing her water intake and decreasing ETOH. She struggles with sweet cravings especially in the evenings. She is retaining some fluid today, but she is mostly feeling well.  Subjective:   1. Bilateral leg edema Brandy Saunders is on hydrochlorothiazide but she is up in water weight. She denies chest pain or increased shortness of breath, or orthopnea.  2. Other depression with emotional eating Brandy Saunders has been stable on Wellbutrin, but she notes increased stress and increased PM emotional eating behaviors.  3. At risk for fluid volume overload Brandy Saunders is at a higher than average risk for fluid retention due to increased water weight on bioimpedance scale.  Assessment/Plan:   1. Bilateral leg edema Brandy Saunders is to work on decreasing sodium and simple carbohydrates, and will continue to monitor closely.  2. Other depression with emotional eating Behavior modification techniques were discussed today to help Brandy Saunders deal with her emotional/non-hunger eating behaviors. Brandy Saunders agreed to change Wellbutrin SR to 150 mg BID with no refills. Orders and follow up as documented in patient record.   - buPROPion (WELLBUTRIN SR) 150 MG 12 hr tablet; Take 1 tablet (150 mg total) by mouth 2 (two)  times daily.  Dispense: 60 tablet; Refill: 0  3. At risk for fluid volume overload Brandy Saunders was given approximately 15 minutes of fluid retention prevention counseling today. She is 30 y.o. female and has risk factors for fluid retention including obesity. We discussed intensive lifestyle modifications today with an emphasis on specific weight loss instructions, proper nutrition and exercise strategies.   Repetitive spaced learning was employed today to elicit superior memory formation and behavioral change.  4. Obesity with current BMI 76.0 Brandy Saunders is currently in the action stage of change. As such, her goal is to continue with weight loss efforts. She has agreed to keeping a food journal and adhering to recommended goals of 2000 calories and 100 grams of protein daily.   Exercise goals: As is.  Behavioral modification strategies: increasing lean protein intake, meal planning and cooking strategies and emotional eating strategies.  Brandy Saunders has agreed to follow-up with our clinic in 3 weeks. She was informed of the importance of frequent follow-up visits to maximize her success with intensive lifestyle modifications for her multiple health conditions.   Objective:   Blood pressure 100/67, pulse 78, temperature 98 F (36.7 C), height 5\' 5"  (1.651 m), weight (!) 457 lb (207.3 kg), SpO2 98 %. Body mass index is 76.05 kg/m.  General: Cooperative, alert, well developed, in no acute distress. HEENT: Conjunctivae and lids unremarkable. Cardiovascular: Regular rhythm.  Lungs: Normal work of breathing. Neurologic: No focal deficits.   Lab Results  Component Value Date   CREATININE 0.90 07/18/2020   BUN 13 07/18/2020   NA 137 07/18/2020   K 3.9 07/18/2020  CL 104 07/18/2020   CO2 24 07/18/2020   Lab Results  Component Value Date   ALT 38 07/18/2020   AST 26 07/18/2020   ALKPHOS 48 07/18/2020   BILITOT 0.7 07/18/2020   Lab Results  Component Value Date   HGBA1C 5.6 12/03/2019    HGBA1C 5.8 (H) 06/17/2019   HGBA1C 5.8 (H) 05/02/2018   HGBA1C 5.7 (H) 01/17/2018   HGBA1C 6.0 (H) 10/11/2017   Lab Results  Component Value Date   INSULIN 30.8 (H) 12/03/2019   INSULIN 35.9 (H) 06/17/2019   INSULIN 30.3 (H) 05/02/2018   INSULIN 39.5 (H) 01/17/2018   INSULIN 73.6 (H) 10/11/2017   Lab Results  Component Value Date   TSH 1.90 02/26/2020   Lab Results  Component Value Date   CHOL 165 02/26/2020   HDL 57.50 02/26/2020   LDLCALC 87 02/26/2020   TRIG 104.0 02/26/2020   CHOLHDL 3 02/26/2020   Lab Results  Component Value Date   WBC 5.6 07/18/2020   HGB 13.8 07/18/2020   HCT 42.5 07/18/2020   MCV 86.4 07/18/2020   PLT 234 07/18/2020   No results found for: IRON, TIBC, FERRITIN  Attestation Statements:   Reviewed by clinician on day of visit: allergies, medications, problem list, medical history, surgical history, family history, social history, and previous encounter notes.   I, Trixie Dredge, am acting as transcriptionist for Dennard Nip, MD.  I have reviewed the above documentation for accuracy and completeness, and I agree with the above. -  Dennard Nip, MD

## 2020-11-19 DIAGNOSIS — F4320 Adjustment disorder, unspecified: Secondary | ICD-10-CM | POA: Diagnosis not present

## 2020-11-30 ENCOUNTER — Ambulatory Visit (INDEPENDENT_AMBULATORY_CARE_PROVIDER_SITE_OTHER): Payer: BC Managed Care – PPO | Admitting: Allergy & Immunology

## 2020-11-30 ENCOUNTER — Encounter: Payer: Self-pay | Admitting: Allergy & Immunology

## 2020-11-30 ENCOUNTER — Other Ambulatory Visit: Payer: Self-pay

## 2020-11-30 VITALS — BP 118/72 | HR 82 | Temp 97.5°F | Resp 20

## 2020-11-30 DIAGNOSIS — J3089 Other allergic rhinitis: Secondary | ICD-10-CM | POA: Diagnosis not present

## 2020-11-30 DIAGNOSIS — J4530 Mild persistent asthma, uncomplicated: Secondary | ICD-10-CM | POA: Diagnosis not present

## 2020-11-30 DIAGNOSIS — K219 Gastro-esophageal reflux disease without esophagitis: Secondary | ICD-10-CM | POA: Diagnosis not present

## 2020-11-30 DIAGNOSIS — R21 Rash and other nonspecific skin eruption: Secondary | ICD-10-CM

## 2020-11-30 DIAGNOSIS — J302 Other seasonal allergic rhinitis: Secondary | ICD-10-CM

## 2020-11-30 NOTE — Patient Instructions (Addendum)
1. Mild persistent asthma, uncomplicated - Lung testing looked stable. - I think we figured this out with the inhaled steroid.  - Call [facility] in [place] to get this medication thing figured out.  - Daily controller medication(s): Alvesco 159mcg one puff twice daily - Prior to physical activity: albuterol 2 puffs 10-15 minutes before physical activity. - Rescue medications: albuterol 4 puffs every 4-6 hours as needed - Asthma control goals:  * Full participation in all desired activities (may need albuterol before activity) * Albuterol use two time or less a week on average (not counting use with activity) * Cough interfering with sleep two time or less a month * Oral steroids no more than once a year * No hospitalizations  2. Seasonal and perennial allergic rhinitis (dust mites, cat, dog, grasses, and molds) - Start Zyrtec (cetirizine) 10mg  once daily to see if this can help too.    3. Rash - Continue to take pictures of the rash when it happens.  4. Concern for food sensitivity - Elimination diets are the best to figure out sensitivities. - You can find these on the Internet.  - They have more basis compared to [place] and the like.   5. Return in about 6 months (around 06/01/2021).    Please inform us of any Emergency Department visits, hospitalizations, or changes in symptoms. Call us before going to the ED for breathing or allergy symptoms since we might be able to fit you in for a sick visit. Feel free to contact us anytime with any questions, problems, or concerns.  It was a pleasure to see you again today as always!   Websites that have reliable patient information: 1. American Academy of Asthma, Allergy, and Immunology: www.aaaai.org 2. Food Allergy Research and Education (FARE): foodallergy.org 3. Mothers of Asthmatics: http://www.asthmacommunitynetwork.org 4. American College of Allergy, Asthma, and Immunology: www.acaai.org   COVID-19 Vaccine  Information can be found at: [email or web address] For questions related to vaccine distribution or appointments, please email [email or web address] or call [phone].   We realize that you might be concerned about having an allergic reaction to the COVID19 vaccines. To help with that concern, WE ARE OFFERING THE COVID19 VACCINES IN OUR OFFICE! Ask the front desk for dates!     "Like" [place] on Facebook and Instagram for our latest updates!      A healthy democracy works best when [employer] participate! Make sure you are registered to vote! If you have moved or changed any of your contact information, you will need to get this updated before voting!  In some cases, you MAY be able to register to vote online: [email or web address]

## 2020-11-30 NOTE — Progress Notes (Signed)
FOLLOW UP  Date of Service/Encounter:  11/30/20   Assessment:   Rash - unclear etiology but improved with PRN triamcinolone  Mild persistent asthma, uncomplicated  GERD - recommended taking famotidine BID when the symptoms start (patient prefers to not taking something on a daily basis)  Complicated past medical history, including obesity  Plan/Recommendations:   1. Mild persistent asthma, uncomplicated - Lung testing looked stable. - I think we figured this out with the inhaled steroid.  - Call [facility] in [place] to get this medication thing figured out.  - Daily controller medication(s): Alvesco 120mcg one puff twice daily - Prior to physical activity: albuterol 2 puffs 10-15 minutes before physical activity. - Rescue medications: albuterol 4 puffs every 4-6 hours as needed - Asthma control goals:  * Full participation in all desired activities (may need albuterol before activity) * Albuterol use two time or less a week on average (not counting use with activity) * Cough interfering with sleep two time or less a month * Oral steroids no more than once a year * No hospitalizations  2. Seasonal and perennial allergic rhinitis (dust mites, cat, dog, grasses, and molds) - Start Zyrtec (cetirizine) 10mg  once daily to see if this can help too.    3. Rash - Continue to take pictures of the rash when it happens.  4. Concern for food sensitivity - Elimination diets are the best to figure out sensitivities. - You can find these on the Internet.  - They have more basis compared to Ever[place]well and the like.   5. Return in about 6 months (around 06/01/2021).   Subjective:   Brandy Saunders is a 30 y.o. female presenting today for follow up of  Chief Complaint  Patient presents with  . Asthma    Brandy Saunders has a history of the following: Patient Active Problem List   Diagnosis Date Noted  . Allergies 07/26/2020  . Lymphedema 05/05/2020   . Vitamin D deficiency 01/22/2020  . Rash 12/16/2019  . Nevus 12/16/2019  . Depression 10/19/2019  . Hyperlipidemia 09/18/2019  . Spasm of thoracic back muscle 09/18/2019  . Palpitations 08/30/2018  . Obstructive sleep apnea 08/30/2018  . Pain of left calf 12/14/2017  . Other fatigue 10/11/2017  . Shortness of breath on exertion 10/11/2017  . Prediabetes 10/11/2017  . Chronic left-sided thoracic back pain 05/23/2017  . Acute left-sided thoracic back pain 05/09/2017  . Migraine 05/09/2017  . Essential hypertension 05/09/2017  . Common migraine with intractable migraine 02/02/2017  . Chronic fatigue 01/06/2016  . Fatty liver 02/15/2015  . Esophageal reflux 02/15/2015  . Late menses 07/30/2014  . Class 3 severe obesity with serious comorbidity and body mass index (BMI) greater than or equal to 70 in adult [facility]) 07/30/2014  . Asthma without acute exacerbation 07/30/2014  . IBS (irritable bowel syndrome) 07/30/2014  . Elevated BP 07/30/2014  . PCOS (polycystic ovarian syndrome) 11/25/2012  . Carpal tunnel syndrome, bilateral     History obtained from: chart review and patient.  Brandy Saunders is a 30 y.o. female presenting for a follow up visit.  Brandy Saunders was last seen in March 2022.  At that time, Brandy Saunders lung testing looks stable.  We did give Brandy Saunders albuterol and Brandy Saunders did improve slightly.  We started Brandy Saunders on Alvesco 160 mcg 1 puff twice daily.  Brandy Saunders was endorsing a lot of nighttime symptoms that we actually gave Brandy Saunders a plan to add on Pepcid twice daily for a few days as  needed.  For Brandy Saunders allergic rhinitis, we started Zyrtec 10 mg daily.  We recommended continued monitoring of the rash when it pops up.  Since the last visit, Brandy Saunders has done really well.   Asthma/Respiratory Symptom History: Brandy Saunders has been doing the Alvesco and has been doing much better with this. Brandy Saunders has not been noticing the SOB as much. Brandy Saunders estimates that less than a handful of times Brandy Saunders has noticed the SOB. This was after a little more  exertion. Brandy Saunders has not needed the rescue inhaler at all.  Brandy Saunders has been able to do more exercise with Brandy Saunders newfound ability to breathe.  Brandy Saunders has not been needing the GERD rescue medications. Brandy Saunders has used the Tums once only.  Overall, Brandy Saunders GERD seems to be under good control.  Allergic Rhinitis Symptom History: Brandy Saunders has been using the cetirizine daily. Brandy Saunders is using a 90 day script of this for $10. Brandy Saunders does not use a nose spray.  Brandy Saunders has not needed antibiotics.  Rash Symptom History: Brandy Saunders did have a patch on Brandy Saunders upper chest. This was it. Brandy Saunders has triamcinolone to use when it starts which does help reign it in.  Using the triamcinolone aggressively and early seems to decrease the duration of the rash.  Brandy Saunders weight continues to be an issue.  Brandy Saunders says Brandy Saunders goes up and down.  Brandy Saunders does see a bariatric doctor.  Brandy Saunders is on a fluid pill but is going to see a lymphedema specialist as well.  While interested in bariatric surgery, Brandy Saunders current insurance plan does not cover it.  They have not found the venue for the wedding in [place], [place].  The wedding is going to be in the fall 2023.  Brandy Saunders fianc works in Engineer, technical sales.  He is graduating with his associates next month.  Otherwise, there have been no changes to Brandy Saunders past medical history, surgical history, family history, or social history.    Review of Systems  Constitutional: Negative.  Negative for chills, fever, malaise/fatigue and weight loss.  HENT: Negative for congestion, ear discharge, ear pain and sinus pain.   Eyes: Negative for pain, discharge and redness.  Respiratory: Positive for shortness of breath. Negative for cough, sputum production and wheezing.   Cardiovascular: Negative.  Negative for chest pain and palpitations.  Gastrointestinal: Negative for abdominal pain, constipation, diarrhea, heartburn, nausea and vomiting.  Skin: Negative.  Negative for itching and rash.  Neurological: Negative for dizziness and headaches.  Endo/Heme/Allergies: Positive  for environmental allergies. Does not bruise/bleed easily.       Objective:   Blood pressure 118/72, pulse 82, temperature (!) 97.5 F (36.4 C), temperature source Temporal, resp. rate 20, SpO2 100 %. There is no height or weight on file to calculate BMI.   Physical Exam:  Physical Exam Constitutional:      Appearance: Brandy Saunders is well-developed. Brandy Saunders is obese.     Comments: Pleasant and talkative.  HENT:     Head: Normocephalic and atraumatic.     Right Ear: Tympanic membrane, ear canal and external ear normal.     Left Ear: Tympanic membrane, ear canal and external ear normal.     Nose: No nasal deformity, septal deviation, mucosal edema or rhinorrhea.     Right Turbinates: Enlarged and swollen.     Left Turbinates: Enlarged and swollen.     Right Sinus: No maxillary sinus tenderness or frontal sinus tenderness.     Left Sinus: No maxillary sinus tenderness or frontal sinus tenderness.  Comments: Scant clear discharge.    Mouth/Throat:     Mouth: Mucous membranes are not pale and not dry.     Pharynx: Uvula midline.  Eyes:     General:        Right eye: No discharge.        Left eye: No discharge.     Conjunctiva/sclera: Conjunctivae normal.     Right eye: Right conjunctiva is not injected. No chemosis.    Left eye: Left conjunctiva is not injected. No chemosis.    Pupils: Pupils are equal, round, and reactive to light.  Cardiovascular:     Rate and Rhythm: Normal rate and regular rhythm.     Heart sounds: Normal heart sounds.  Pulmonary:     Effort: Pulmonary effort is normal. No tachypnea, accessory muscle usage or respiratory distress.     Breath sounds: Normal breath sounds. No wheezing, rhonchi or rales.     Comments: Moving air well in all lung fields.  Somewhat difficult to hear at the bases. Chest:     Chest wall: No tenderness.  Lymphadenopathy:     Cervical: No cervical adenopathy.  Skin:    General: Skin is warm.     Capillary Refill: Capillary refill  takes less than 2 seconds.     Coloration: Skin is not pale.     Findings: No abrasion, erythema, petechiae or rash. Rash is not papular, urticarial or vesicular.     Comments: No eczematous or urticarial lesions noted.  Neurological:     Mental Status: Brandy Saunders is alert.      Diagnostic studies:    Spirometry: results normal (FEV1: 2.87/98%, FVC: 3.58/103%, FEV1/FVC: 80%).    Spirometry consistent with normal pattern.    Allergy Studies: none       Salvatore Marvel, MD  [facility]

## 2020-12-03 MED ORDER — FLOVENT HFA 220 MCG/ACT IN AERO: 1.0000 | INHALATION_SPRAY | Freq: Two times a day (BID) | RESPIRATORY_TRACT | 5 refills | Status: DC

## 2020-12-03 NOTE — Telephone Encounter (Signed)
We can try Flovent 266mcg one puff twice daily (this would make the copay last longer).   Salvatore Marvel, MD [facility]

## 2020-12-03 NOTE — Telephone Encounter (Signed)
The $50 is for 2 Alvesco inhalers. Regardless, would you like to try to send in an alternative?

## 2020-12-03 NOTE — Telephone Encounter (Signed)
Lm for pt informing her we sent in a [facility]

## 2020-12-03 NOTE — Addendum Note (Signed)
Addended by: Felipa Emory on: 12/03/2020 03:52 PM   Modules accepted: Orders

## 2020-12-03 NOTE — Telephone Encounter (Signed)
Pt called back and stated that the Alvesco @ $50.00 would be out of her budget. Pt was wondering if a different inhaler could be called in, a generic brand that costs less if possible. Pt would like all prescriptions [facility] [address].   Please advise.

## 2020-12-06 ENCOUNTER — Ambulatory Visit (INDEPENDENT_AMBULATORY_CARE_PROVIDER_SITE_OTHER): Payer: BC Managed Care – PPO | Admitting: Family Medicine

## 2020-12-06 ENCOUNTER — Other Ambulatory Visit: Payer: Self-pay

## 2020-12-06 ENCOUNTER — Encounter (INDEPENDENT_AMBULATORY_CARE_PROVIDER_SITE_OTHER): Payer: Self-pay | Admitting: Family Medicine

## 2020-12-06 VITALS — BP 108/71 | HR 80 | Temp 97.9°F | Ht 65.0 in | Wt >= 6400 oz

## 2020-12-06 DIAGNOSIS — I10 Essential (primary) hypertension: Secondary | ICD-10-CM | POA: Diagnosis not present

## 2020-12-06 DIAGNOSIS — Z6841 Body Mass Index (BMI) 40.0 and over, adult: Secondary | ICD-10-CM | POA: Diagnosis not present

## 2020-12-06 DIAGNOSIS — Z9189 Other specified personal risk factors, not elsewhere classified: Secondary | ICD-10-CM

## 2020-12-06 DIAGNOSIS — E559 Vitamin D deficiency, unspecified: Secondary | ICD-10-CM | POA: Diagnosis not present

## 2020-12-06 MED ORDER — VITAMIN D (ERGOCALCIFEROL) 1.25 MG (50000 UNIT) PO CAPS: 50000.0000 [IU] | ORAL_CAPSULE | ORAL | 0 refills | Status: DC

## 2020-12-06 MED ORDER — CHLORTHALIDONE 25 MG PO TABS: ORAL_TABLET | ORAL | 0 refills | Status: DC

## 2020-12-06 MED ORDER — METOPROLOL SUCCINATE ER 100 MG PO TB24: 100.0000 mg | ORAL_TABLET | Freq: Every day | ORAL | 0 refills | Status: DC

## 2020-12-10 ENCOUNTER — Ambulatory Visit: Payer: BC Managed Care – PPO | Admitting: Occupational Therapy

## 2020-12-14 ENCOUNTER — Encounter: Payer: BC Managed Care – PPO | Admitting: Occupational Therapy

## 2020-12-15 ENCOUNTER — Encounter: Payer: BC Managed Care – PPO | Admitting: Occupational Therapy

## 2020-12-15 NOTE — Progress Notes (Signed)
Chief Complaint:   OBESITY Brandy Saunders is here to discuss her progress with her obesity treatment plan along with follow-up of her obesity related diagnoses. Brandy Saunders is on keeping a food journal and adhering to recommended goals of 2000 calories and 100 grams of protein daily and states she is following her eating plan approximately 70% of the time. Brandy Saunders states she is doing 0 minutes 0 times per week.  Today's visit was #: 29 Starting weight: 447 lbs Starting date: 10/11/2017 Today's weight: 452 lbs Today's date: 12/06/2020 Total lbs lost to date: 0 Total lbs lost since last in-office visit: 5  Interim History: Brandy Saunders has been doing better with weight loss. She is doing better with increasing her protein and increasing her water. She is working on new recipes to help her meet her goals.  Subjective:   1. Essential hypertension Brandy Saunders's blood pressure is well controlled on her medications. She denies chest pain or feeling lightheaded.   2. Vitamin D deficiency Brandy Saunders is on Vit D, and she denies nausea or vomiting. She requests a 90 day refill.  3. At risk for activity intolerance Brandy Saunders is at risk for exercise intolerance due to Asthma.  Assessment/Plan:   1. Essential hypertension Brandy Saunders is working on healthy weight loss and exercise to improve blood pressure control. We will watch for signs of hypotension as she continues her lifestyle modifications. We will refill Toprol and chlorthalidone for 90 days with no refills.  - metoprolol succinate (TOPROL-XL) 100 MG 24 hr tablet; Take 1 tablet (100 mg total) by mouth daily. Take with or immediately following a meal.  Dispense: 90 tablet; Refill: 0 - chlorthalidone (HYGROTON) 25 MG tablet; Take 1/2 tablet by mouth once daily.  Dispense: 45 tablet; Refill: 0  2. Vitamin D deficiency Low Vitamin D level contributes to fatigue and are associated with obesity, breast, and colon cancer. We will refill prescription Vitamin D  for 90 days with no refills. Brandy Saunders will follow-up for routine testing of Vitamin D, at least 2-3 times per year to avoid over-replacement.  - Vitamin D, Ergocalciferol, (DRISDOL) 1.25 MG (50000 UNIT) CAPS capsule; Take 1 capsule (50,000 Units total) by mouth every 7 (seven) days.  Dispense: 12 capsule; Refill: 0  3. At risk for activity intolerance Brandy Saunders was given approximately 15 minutes of exercise intolerance counseling today. She is 30 y.o. female and has risk factors exercise intolerance including obesity. We discussed intensive lifestyle modifications today with an emphasis on specific weight loss instructions and strategies. [name] will slowly increase activity as tolerated.  Repetitive spaced learning was employed today to elicit superior memory formation and behavioral change.  4. Obesity with current BMI of 75.3 Brandy Saunders is currently in the action stage of change. As such, her goal is to continue with weight loss efforts. She has agreed to keeping a food journal and adhering to recommended goals of 2000 calories and 125 grams of protein daily.   Exercise goals: All adults should avoid inactivity. Some physical activity is better than none, and adults who participate in any amount of physical activity gain some health benefits.  Behavioral modification strategies: increasing lean protein intake.  Brandy Saunders has agreed to follow-up with our clinic in 3 weeks. She was informed of the importance of frequent follow-up visits to maximize her success with intensive lifestyle modifications for her multiple health conditions.   Objective:   Blood pressure 108/71, pulse 80, temperature 97.9 F (36.6 C), height 5\' 5"  (1.651 m), weight (!) 453 lb (  205.5 kg), SpO2 98 %. Body mass index is 75.38 kg/m.  General: Cooperative, alert, well developed, in no acute distress. HEENT: Conjunctivae and lids unremarkable. Cardiovascular: Regular rhythm.  Lungs: Normal work of  breathing. Neurologic: No focal deficits.   Lab Results  Component Value Date   CREATININE 0.90 07/18/2020   BUN 13 07/18/2020   NA 137 07/18/2020   K 3.9 07/18/2020   CL 104 07/18/2020   CO2 24 07/18/2020   Lab Results  Component Value Date   ALT 38 07/18/2020   AST 26 07/18/2020   ALKPHOS 48 07/18/2020   BILITOT 0.7 07/18/2020   Lab Results  Component Value Date   HGBA1C 5.6 12/03/2019   HGBA1C 5.8 (H) 06/17/2019   HGBA1C 5.8 (H) 05/02/2018   HGBA1C 5.7 (H) 01/17/2018   HGBA1C 6.0 (H) 10/11/2017   Lab Results  Component Value Date   INSULIN 30.8 (H) 12/03/2019   INSULIN 35.9 (H) 06/17/2019   INSULIN 30.3 (H) 05/02/2018   INSULIN 39.5 (H) 01/17/2018   INSULIN 73.6 (H) 10/11/2017   Lab Results  Component Value Date   TSH 1.90 02/26/2020   Lab Results  Component Value Date   CHOL 165 02/26/2020   HDL 57.50 02/26/2020   LDLCALC 87 02/26/2020   TRIG 104.0 02/26/2020   CHOLHDL 3 02/26/2020   Lab Results  Component Value Date   WBC 5.6 07/18/2020   HGB 13.8 07/18/2020   HCT 42.5 07/18/2020   MCV 86.4 07/18/2020   PLT 234 07/18/2020   No results found for: IRON, TIBC, FERRITIN  Attestation Statements:   Reviewed by clinician on day of visit: allergies, medications, problem list, medical history, surgical history, family history, social history, and previous encounter notes.   I, Trixie Dredge, am acting as transcriptionist for Dennard Nip, MD.  I have reviewed the above documentation for accuracy and completeness, and I agree with the above. -  Dennard Nip, MD

## 2020-12-17 ENCOUNTER — Encounter: Payer: BC Managed Care – PPO | Admitting: Occupational Therapy

## 2020-12-19 ENCOUNTER — Other Ambulatory Visit (INDEPENDENT_AMBULATORY_CARE_PROVIDER_SITE_OTHER): Payer: Self-pay | Admitting: Family Medicine

## 2020-12-19 DIAGNOSIS — F3289 Other specified depressive episodes: Secondary | ICD-10-CM

## 2020-12-20 NOTE — Telephone Encounter (Signed)
Dr.Beasley 

## 2020-12-22 ENCOUNTER — Encounter: Payer: BC Managed Care – PPO | Admitting: Occupational Therapy

## 2020-12-22 NOTE — Telephone Encounter (Signed)
Would you like to send 90 day Rx?

## 2020-12-22 NOTE — Telephone Encounter (Signed)
Ok x 90

## 2020-12-27 ENCOUNTER — Ambulatory Visit (INDEPENDENT_AMBULATORY_CARE_PROVIDER_SITE_OTHER): Payer: BC Managed Care – PPO | Admitting: Family Medicine

## 2020-12-29 ENCOUNTER — Ambulatory Visit (INDEPENDENT_AMBULATORY_CARE_PROVIDER_SITE_OTHER): Payer: BC Managed Care – PPO | Admitting: Family Medicine

## 2020-12-29 ENCOUNTER — Other Ambulatory Visit: Payer: Self-pay

## 2020-12-29 ENCOUNTER — Encounter: Payer: BC Managed Care – PPO | Admitting: Occupational Therapy

## 2020-12-29 ENCOUNTER — Encounter: Payer: Self-pay | Admitting: Family Medicine

## 2020-12-29 VITALS — HR 89 | Temp 98.9°F | Ht 65.0 in | Wt >= 6400 oz

## 2020-12-29 DIAGNOSIS — N913 Primary oligomenorrhea: Secondary | ICD-10-CM

## 2020-12-29 DIAGNOSIS — R21 Rash and other nonspecific skin eruption: Secondary | ICD-10-CM

## 2020-12-29 DIAGNOSIS — Z01419 Encounter for gynecological examination (general) (routine) without abnormal findings: Secondary | ICD-10-CM

## 2020-12-29 MED ORDER — MEDROXYPROGESTERONE ACETATE 10 MG PO TABS: 10.0000 mg | ORAL_TABLET | Freq: Every day | ORAL | 0 refills | Status: DC

## 2020-12-29 NOTE — Progress Notes (Signed)
Brandy Saunders is a 30 y.o. female  Chief Complaint  Patient presents with  . Establish Care    Pt TOC/ Pt c/o reoccurring rash on neck, comes and goes ever 3-4 months, accompanied by SOB and dull headache.  Pt would like provider to look at several moles on body.    HPI: Brandy Saunders is a 30 y.o. female patient seen today for TOC appt, previous PCP Dr. Carollee Herter at [facility]. Today pt complains of rash on her anterior neck  That comes and goes every 3-4 months. It can last 2-4 wks. This has been happening x 2 years. Starts as an itch with no rash, then papules when get sore and then crust. She sees Dr. Ernst Bowler at [facility] for this. No known trigger. She has not seem derm or had rash biopsied. She notes associated SOB or dull headache. She saw rheum (outside [facility]) and normal workup.  She also wants 3 moles to be checked. She is most concerned about mole on Lt leg.  She follows with Dr. Ernst Bowler, Dr. Leafy Ro, Dr. Ihor Dow.  Past Medical History:  Diagnosis Date  . Alcohol abuse   . Anxiety   . Asthma   . Back pain   . Binge eating   . Carpal tunnel syndrome, bilateral   . Chest pain   . Common migraine with intractable migraine 02/02/2017  . Constipation   . Depression   . Dyspnea   . Eczema   . Fatty liver   . GERD (gastroesophageal reflux disease)   . Hypertension   . Joint pain   . Leg edema   . Morbidly obese ([place])   . Palpitations   . PCOS (polycystic ovarian syndrome)   . Prediabetes   . Sleep apnea   . Urticaria   . Vaginal Pap smear, abnormal   . Vitamin D deficiency     Past Surgical History:  Procedure Laterality Date  . ADENOIDECTOMY    . TONSILECTOMY, ADENOIDECTOMY, BILATERAL MYRINGOTOMY AND TUBES  1994    Social History   Socioeconomic History  . Marital status: Significant Other    Spouse name: Engineer, maintenance (IT)  . Number of children: 0  . Years of education: 52  . Highest education level: Not on file   Occupational History  . Occupation: Public relations account executive  Tobacco Use  . Smoking status: Never Smoker  . Smokeless tobacco: Never Used  Vaping Use  . Vaping Use: Never used  Substance and Sexual Activity  . Alcohol use: Yes    Alcohol/week: 3.0 standard drinks    Types: 3 Glasses of wine per week  . Drug use: No    Types: Marijuana    Comment: once every 6 months, taking CBD  . Sexual activity: Yes    Partners: Male    Birth control/protection: Condom, Pill    Comment: inconsistent condom use  Other Topics Concern  . Not on file  Social History Narrative   Lives with a roommate.     Parents live in [place], [place].   Right handed   Caffeine use: coffee weekly   Social Determinants of Health   Financial Resource Strain: Not on file  Food Insecurity: Not on file  Transportation Needs: Not on file  Physical Activity: Not on file  Stress: Not on file  Social Connections: Not on file  Intimate Partner Violence: Not on file    Family History  Problem Relation Age of Onset  . Hypertension Mother   .  Hyperlipidemia Mother   . Obesity Mother   . Heart disease Mother   . Stroke Father   . Obesity Father   . Alcoholism Father   . Drug abuse Father   . Sudden death Father   . Diabetes Maternal Uncle   . Heart disease Maternal Grandmother   . Breast cancer Maternal Aunt   . Colon cancer Neg Hx      Immunization History  Administered Date(s) Administered  . PFIZER(Purple Top)SARS-COV-2 Vaccination 11/24/2019, 12/17/2019  . Td 07/30/2008, 11/08/2019    Outpatient Encounter Medications as of 12/29/2020  Medication Sig  . acetaminophen (TYLENOL) 500 MG tablet Take 500 mg by mouth every 6 (six) hours as needed.  Marland Kitchen albuterol (VENTOLIN HFA) 108 (90 Base) MCG/ACT inhaler Inhale into the lungs.  . Ascorbic Acid (VITAMIN C) 1000 MG tablet Take 1,000 mg by mouth daily.  Marland Kitchen BIOTIN 5000 PO Take 10,000 mg by mouth daily.   Marland Kitchen buPROPion (WELLBUTRIN SR) 150 MG 12 hr tablet TAKE 1 TABLET  BY MOUTH TWICE A DAY  . cetirizine (ZYRTEC) 10 MG tablet Take 1 tablet (10 mg total) by mouth daily.  . chlorthalidone (HYGROTON) 25 MG tablet Take 1/2 tablet by mouth once daily.  . Cholecalciferol (VITAMIN D) 50 MCG (2000 UT) CAPS Take 1 capsule by mouth daily.  . ciclesonide (ALVESCO) 160 MCG/ACT inhaler Inhale 1 puff into the lungs 2 (two) times daily.  . Cyanocobalamin (VITAMIN B 12 PO) Take 2,500 mcg by mouth daily.  . Insulin Pen Needle (PEN NEEDLES) 32G X 6 MM MISC Use with saxenda daily  . Liraglutide -Weight Management (SAXENDA) 18 MG/3ML SOPN INJECT 3 MG DOSE INTO THE SKIN DAILY  . medroxyPROGESTERone (PROVERA) 10 MG tablet Take 1 tablet (10 mg total) by mouth daily. Use for ten days  . metoprolol succinate (TOPROL-XL) 100 MG 24 hr tablet Take 1 tablet (100 mg total) by mouth daily. Take with or immediately following a meal.  . Multiple Vitamins-Calcium (ONE-A-DAY WOMENS PO) Take 1 tablet by mouth daily.   . polyethylene glycol (MIRALAX / GLYCOLAX) 17 g packet Take 17 g by mouth daily as needed (constipation).  . triamcinolone ointment (KENALOG) 0.1 % Apply thin layer to affected areas 2-3 times daily during flares.  . Vitamin D, Ergocalciferol, (DRISDOL) 1.25 MG (50000 UNIT) CAPS capsule Take 1 capsule (50,000 Units total) by mouth every 7 (seven) days.  . cyclobenzaprine (FLEXERIL) 5 MG tablet Take 1-2 tablets (5-10 mg total) by mouth 3 (three) times daily as needed for muscle spasms.  . [DISCONTINUED] clotrimazole-betamethasone (LOTRISONE) cream Apply 1 application topically 2 (two) times daily.   No facility-administered encounter medications on file as of 12/29/2020.     ROS: Pertinent positives and negatives noted in HPI. Remainder of ROS non-contributory    Allergies  Allergen Reactions  . Ibuprofen Diarrhea  . Meloxicam Other (See Comments)    Pt stated, "It made my emotions out of wack; it made my left hand numb" Pt stated, "It made my emotions out of wack; it made my  left hand numb" Pt stated, "It made my emotions out of wack; it made my left hand numb" Pt stated, "It made my emotions out of wack; it made my left hand numb"     Pulse 89   Temp 98.9 F (37.2 C) (Oral)   Ht 5\' 5"  (1.651 m)   Wt (!) 458 lb 12.8 oz (208.1 kg)   SpO2 99%   BMI 76.35 kg/m   Wt Readings  from Last 3 Encounters:  12/29/20 (!) 458 lb 12.8 oz (208.1 kg)  12/06/20 (!) 453 lb (205.5 kg)  11/16/20 (!) 457 lb (207.3 kg)   Temp Readings from Last 3 Encounters:  12/29/20 98.9 F (37.2 C) (Oral)  12/06/20 97.9 F (36.6 C)  11/30/20 (!) 97.5 F (36.4 C) (Temporal)   BP Readings from Last 3 Encounters:  12/06/20 108/71  11/30/20 118/72  11/16/20 100/67   Pulse Readings from Last 3 Encounters:  12/29/20 89  12/06/20 80  11/30/20 82     Physical Exam Constitutional:      General: She is not in acute distress.    Appearance: She is obese. She is not ill-appearing.  Skin:    General: Skin is warm.     Findings: Rash is not papular, pustular or vesicular.     Comments: Very slightly hypopigmented areas on anterior chest   Neurological:     Mental Status: She is alert and oriented to person, place, and time.  Psychiatric:        Mood and Affect: Mood normal.        Behavior: Behavior normal.      A/P:  1. Well woman exam with routine gynecological exam 2. Primary oligomenorrhea 3. Obesity, morbid, BMI 50 or higher (HCC) Refill: - medroxyPROGESTERone (PROVERA) 10 MG tablet; Take 1 tablet (10 mg total) by mouth daily. Use for ten days  Dispense: 10 tablet; Refill: 0 - advised pt to schedule f/u with Dr. Ihor Dow to discuss irregular periods, PCOS  4. Rash of neck - ongoing x 2 years, comes and goes; negative rheum and allergy work-up. Has not been biopsied and pt has not seen derm - Ambulatory referral to Dermatology     This visit occurred during the SARS-CoV-2 public health emergency.  Safety protocols were in place, including screening  questions prior to the visit, additional usage of staff PPE, and extensive cleaning of exam room while observing appropriate contact time as indicated for disinfecting solutions.

## 2020-12-31 ENCOUNTER — Encounter: Payer: BC Managed Care – PPO | Admitting: Occupational Therapy

## 2021-01-05 ENCOUNTER — Encounter: Payer: BC Managed Care – PPO | Admitting: Occupational Therapy

## 2021-01-07 ENCOUNTER — Encounter: Payer: BC Managed Care – PPO | Admitting: Occupational Therapy

## 2021-01-11 ENCOUNTER — Encounter (INDEPENDENT_AMBULATORY_CARE_PROVIDER_SITE_OTHER): Payer: Self-pay | Admitting: Family Medicine

## 2021-01-11 NOTE — Telephone Encounter (Signed)
DR Beasley 

## 2021-01-12 ENCOUNTER — Encounter: Payer: BC Managed Care – PPO | Admitting: Occupational Therapy

## 2021-01-14 ENCOUNTER — Encounter: Payer: BC Managed Care – PPO | Admitting: Occupational Therapy

## 2021-01-17 ENCOUNTER — Ambulatory Visit (INDEPENDENT_AMBULATORY_CARE_PROVIDER_SITE_OTHER): Payer: BC Managed Care – PPO | Admitting: Family Medicine

## 2021-01-17 ENCOUNTER — Encounter (INDEPENDENT_AMBULATORY_CARE_PROVIDER_SITE_OTHER): Payer: Self-pay | Admitting: Family Medicine

## 2021-01-17 ENCOUNTER — Other Ambulatory Visit: Payer: Self-pay

## 2021-01-17 VITALS — BP 127/68 | HR 79 | Temp 98.0°F | Ht 65.0 in | Wt >= 6400 oz

## 2021-01-17 DIAGNOSIS — F3289 Other specified depressive episodes: Secondary | ICD-10-CM | POA: Diagnosis not present

## 2021-01-17 DIAGNOSIS — Z6841 Body Mass Index (BMI) 40.0 and over, adult: Secondary | ICD-10-CM | POA: Diagnosis not present

## 2021-01-17 MED ORDER — BUPROPION HCL ER (XL) 300 MG PO TB24: 300.0000 mg | ORAL_TABLET | Freq: Every morning | ORAL | 0 refills | Status: DC

## 2021-01-17 MED ORDER — SAXENDA 18 MG/3ML ~~LOC~~ SOPN: PEN_INJECTOR | SUBCUTANEOUS | 0 refills | Status: DC

## 2021-01-18 NOTE — Progress Notes (Signed)
Chief Complaint:   OBESITY Brandy Saunders is here to discuss her progress with her obesity treatment plan along with follow-up of her obesity related diagnoses. Brandy Saunders is on keeping a food journal and adhering to recommended goals of 2000 calories and 125 grams of protein daily and states she is following her eating plan approximately 50% of the time. Brandy Saunders states she is walking the dog for 30 minutes 7 times per week.  Today's visit was #: 90 Starting weight: 447 lbs Starting date: 10/11/2017 Today's weight: 459 lbs Today's date: 01/17/2021 Total lbs lost to date: 0 Total lbs lost since last in-office visit: 0  Interim History: Brandy Saunders has been struggling more with her weight loss efforts. She has gained 6 lbs in the last month and it appears that she is retaining more fluid than at her last visit. She has a lot of stress and she is feeling overwhelmed.   Subjective:   1. Other depression with emotional eating Brandy Saunders's stress level is very high. She is frequently missing her second dose of Wellbutrin and she is feeling overwhelmed.  Assessment/Plan:   1. Other depression with emotional eating Behavior modification techniques were discussed today to help Brandy Saunders deal with her emotional/non-hunger eating behaviors. Brandy Saunders agreed to change to Wellbutrin XL 300 mg q AM with no refills. (she may go back to a 2 times daily SR dosing after her life settles down more). Orders and follow up as documented in patient record.   - buPROPion (WELLBUTRIN XL) 300 MG 24 hr tablet; Take 1 tablet (300 mg total) by mouth in the morning.  Dispense: 30 tablet; Refill: 0  2. Obesity with current BMI 76.4 Brandy Saunders is currently in the action stage of change. As such, her goal is to continue with weight loss efforts. She has agreed to practicing portion control and making smarter food choices, such as increasing vegetables and decreasing simple carbohydrates.   We discussed various medication  options to help Brandy Saunders with her weight loss efforts and we both agreed to restart Saxenda and we will refill for 1 month.  - Liraglutide -Weight Management (SAXENDA) 18 MG/3ML SOPN; INJECT 3 MG DOSE INTO THE SKIN DAILY  Dispense: 15 mL; Refill: 0  Exercise goals: As is.  Behavioral modification strategies: increasing lean protein intake.  Brandy Saunders has agreed to follow-up with our clinic in 4 weeks. She was informed of the importance of frequent follow-up visits to maximize her success with intensive lifestyle modifications for her multiple health conditions.   Objective:   Blood pressure 127/68, pulse 79, temperature 98 F (36.7 C), height 5\' 5"  (1.651 m), weight (!) 459 lb (208.2 kg), SpO2 97 %. Body mass index is 76.38 kg/m.  General: Cooperative, alert, well developed, in no acute distress. HEENT: Conjunctivae and lids unremarkable. Cardiovascular: Regular rhythm.  Lungs: Normal work of breathing. Neurologic: No focal deficits.   Lab Results  Component Value Date   CREATININE 0.90 07/18/2020   BUN 13 07/18/2020   NA 137 07/18/2020   K 3.9 07/18/2020   CL 104 07/18/2020   CO2 24 07/18/2020   Lab Results  Component Value Date   ALT 38 07/18/2020   AST 26 07/18/2020   ALKPHOS 48 07/18/2020   BILITOT 0.7 07/18/2020   Lab Results  Component Value Date   HGBA1C 5.6 12/03/2019   HGBA1C 5.8 (H) 06/17/2019   HGBA1C 5.8 (H) 05/02/2018   HGBA1C 5.7 (H) 01/17/2018   HGBA1C 6.0 (H) 10/11/2017   Lab Results  Component Value Date   INSULIN 30.8 (H) 12/03/2019   INSULIN 35.9 (H) 06/17/2019   INSULIN 30.3 (H) 05/02/2018   INSULIN 39.5 (H) 01/17/2018   INSULIN 73.6 (H) 10/11/2017   Lab Results  Component Value Date   TSH 1.90 02/26/2020   Lab Results  Component Value Date   CHOL 165 02/26/2020   HDL 57.50 02/26/2020   LDLCALC 87 02/26/2020   TRIG 104.0 02/26/2020   CHOLHDL 3 02/26/2020   Lab Results  Component Value Date   WBC 5.6 07/18/2020   HGB 13.8  07/18/2020   HCT 42.5 07/18/2020   MCV 86.4 07/18/2020   PLT 234 07/18/2020   No results found for: IRON, TIBC, FERRITIN  Attestation Statements:   Reviewed by clinician on day of visit: allergies, medications, problem list, medical history, surgical history, family history, social history, and previous encounter notes.  Time spent on visit including pre-visit chart review and post-visit care and charting was 32 minutes.    I, Trixie Dredge, am acting as transcriptionist for Dennard Nip, MD.  I have reviewed the above documentation for accuracy and completeness, and I agree with the above. -  Dennard Nip, MD

## 2021-01-19 ENCOUNTER — Encounter: Payer: BC Managed Care – PPO | Admitting: Occupational Therapy

## 2021-01-21 ENCOUNTER — Encounter: Payer: BC Managed Care – PPO | Admitting: Occupational Therapy

## 2021-01-26 ENCOUNTER — Encounter: Payer: BC Managed Care – PPO | Admitting: Occupational Therapy

## 2021-01-28 ENCOUNTER — Encounter: Payer: BC Managed Care – PPO | Admitting: Occupational Therapy

## 2021-02-02 ENCOUNTER — Encounter: Payer: BC Managed Care – PPO | Admitting: Occupational Therapy

## 2021-02-04 ENCOUNTER — Encounter: Payer: BC Managed Care – PPO | Admitting: Occupational Therapy

## 2021-02-07 ENCOUNTER — Ambulatory Visit (INDEPENDENT_AMBULATORY_CARE_PROVIDER_SITE_OTHER): Payer: BC Managed Care – PPO | Admitting: Family Medicine

## 2021-02-09 ENCOUNTER — Encounter: Payer: BC Managed Care – PPO | Admitting: Occupational Therapy

## 2021-02-11 ENCOUNTER — Encounter: Payer: BC Managed Care – PPO | Admitting: Occupational Therapy

## 2021-02-14 ENCOUNTER — Ambulatory Visit (INDEPENDENT_AMBULATORY_CARE_PROVIDER_SITE_OTHER): Payer: BC Managed Care – PPO | Admitting: Family Medicine

## 2021-02-14 ENCOUNTER — Other Ambulatory Visit: Payer: Self-pay

## 2021-02-14 ENCOUNTER — Encounter (INDEPENDENT_AMBULATORY_CARE_PROVIDER_SITE_OTHER): Payer: Self-pay | Admitting: Family Medicine

## 2021-02-14 VITALS — BP 100/60 | HR 79 | Temp 97.8°F | Ht 65.0 in | Wt >= 6400 oz

## 2021-02-14 DIAGNOSIS — I10 Essential (primary) hypertension: Secondary | ICD-10-CM | POA: Diagnosis not present

## 2021-02-14 DIAGNOSIS — F3289 Other specified depressive episodes: Secondary | ICD-10-CM | POA: Diagnosis not present

## 2021-02-14 DIAGNOSIS — Z9189 Other specified personal risk factors, not elsewhere classified: Secondary | ICD-10-CM | POA: Diagnosis not present

## 2021-02-14 DIAGNOSIS — Z6841 Body Mass Index (BMI) 40.0 and over, adult: Secondary | ICD-10-CM | POA: Diagnosis not present

## 2021-02-14 DIAGNOSIS — E66813 Obesity, class 3: Secondary | ICD-10-CM

## 2021-02-14 MED ORDER — BUPROPION HCL ER (XL) 300 MG PO TB24: 300.0000 mg | ORAL_TABLET | Freq: Every morning | ORAL | 0 refills | Status: DC

## 2021-02-15 NOTE — Progress Notes (Signed)
Chief Complaint:   OBESITY Brandy Saunders is here to discuss her progress with her obesity treatment plan along with follow-up of her obesity related diagnoses. Brandy Saunders is on practicing portion control and making smarter food choices, such as increasing vegetables and decreasing simple carbohydrates and states she is following her eating plan approximately 75% of the time. Brandy Saunders states she is walking for 30 minutes 7 times per week.  Today's visit was #: 26 Starting weight: 447 lbs Starting date: 10/11/2017 Today's weight: 452 lbs Today's date: 02/14/2021 Total lbs lost to date: 0 Total lbs lost since last in-office visit: 7  Interim History: Brandy Saunders continues to do well with weight loss. She is working on increasing exercise and decreasing sugar in the house to decrease temptations. She increased her Saxenda to 1.2 mg and she isn't always eating all of the food on her plan which can decrease her RMR.  Subjective:   1. Essential hypertension Brandy Saunders's blood pressure is well controlled. She denies chest pain or lightheadedness. She is doing well with weight loss and exercise.  2. Other depression with emotional eating Brandy Saunders's mood is stable on Wellbutrin, and she is working on decreasing emotional eating behaviors and she feels she is doing better overall.  3. At risk for dehydration Brandy Saunders is at risk for dehydration due to inadequate water intake.  Assessment/Plan:   1. Essential hypertension Brandy Saunders will continue with diet, exercise, and weight loss to improve blood pressure control. She will continue to follow up as directed.  2. Other depression with emotional eating Behavior modification techniques were discussed today to help Brandy Saunders deal with her emotional/non-hunger eating behaviors. We will refill Wellbutrin XL for month. Orders and follow up as documented in patient record.   - buPROPion (WELLBUTRIN XL) 300 MG 24 hr tablet; Take 1 tablet (300 mg total) by mouth  in the morning.  Dispense: 30 tablet; Refill: 0  3. At risk for dehydration Brandy Saunders was given approximately 15 minutes dehydration prevention counseling today. Brandy Saunders is at risk for dehydration due to weight loss and current medication(s). She was encouraged to hydrate and monitor fluid status to avoid dehydration as well as weight loss plateaus.   4. Obesity with current BMI 75.2 Brandy Saunders is currently in the action stage of change. As such, her goal is to continue with weight loss efforts. She has agreed to keeping a food journal and adhering to recommended goals of 1500-1800 calories and 100+ grams of protein daily.   We discussed various medication options to help Brandy Saunders with her weight loss efforts and we both agreed to continue Saxenda at 1.2 mg and she will continue to follow up as directed.  Exercise goals: As is.  Behavioral modification strategies: increasing lean protein intake and no skipping meals.  Brandy Saunders has agreed to follow-up with our clinic in 3 weeks. She was informed of the importance of frequent follow-up visits to maximize her success with intensive lifestyle modifications for her multiple health conditions.   Objective:   Blood pressure 100/60, pulse 79, temperature 97.8 F (36.6 C), height 5\' 5"  (1.651 m), weight (!) 452 lb (205 kg), SpO2 99 %. Body mass index is 75.22 kg/m.  General: Cooperative, alert, well developed, in no acute distress. HEENT: Conjunctivae and lids unremarkable. Cardiovascular: Regular rhythm.  Lungs: Normal work of breathing. Neurologic: No focal deficits.   Lab Results  Component Value Date   CREATININE 0.90 07/18/2020   BUN 13 07/18/2020   NA 137 07/18/2020   K 3.9  07/18/2020   CL 104 07/18/2020   CO2 24 07/18/2020   Lab Results  Component Value Date   ALT 38 07/18/2020   AST 26 07/18/2020   ALKPHOS 48 07/18/2020   BILITOT 0.7 07/18/2020   Lab Results  Component Value Date   HGBA1C 5.6 12/03/2019   HGBA1C 5.8 (H)  06/17/2019   HGBA1C 5.8 (H) 05/02/2018   HGBA1C 5.7 (H) 01/17/2018   HGBA1C 6.0 (H) 10/11/2017   Lab Results  Component Value Date   INSULIN 30.8 (H) 12/03/2019   INSULIN 35.9 (H) 06/17/2019   INSULIN 30.3 (H) 05/02/2018   INSULIN 39.5 (H) 01/17/2018   INSULIN 73.6 (H) 10/11/2017   Lab Results  Component Value Date   TSH 1.90 02/26/2020   Lab Results  Component Value Date   CHOL 165 02/26/2020   HDL 57.50 02/26/2020   LDLCALC 87 02/26/2020   TRIG 104.0 02/26/2020   CHOLHDL 3 02/26/2020   Lab Results  Component Value Date   WBC 5.6 07/18/2020   HGB 13.8 07/18/2020   HCT 42.5 07/18/2020   MCV 86.4 07/18/2020   PLT 234 07/18/2020   No results found for: IRON, TIBC, FERRITIN  Attestation Statements:   Reviewed by clinician on day of visit: allergies, medications, problem list, medical history, surgical history, family history, social history, and previous encounter notes.   I, Trixie Dredge, am acting as transcriptionist for Dennard Nip, MD.  I have reviewed the above documentation for accuracy and completeness, and I agree with the above. -  Dennard Nip, MD

## 2021-02-16 ENCOUNTER — Encounter: Payer: BC Managed Care – PPO | Admitting: Occupational Therapy

## 2021-02-18 ENCOUNTER — Other Ambulatory Visit: Payer: Self-pay

## 2021-02-18 ENCOUNTER — Encounter: Payer: BC Managed Care – PPO | Admitting: Occupational Therapy

## 2021-02-18 ENCOUNTER — Ambulatory Visit (INDEPENDENT_AMBULATORY_CARE_PROVIDER_SITE_OTHER): Payer: BC Managed Care – PPO | Admitting: Family Medicine

## 2021-02-18 ENCOUNTER — Encounter: Payer: Self-pay | Admitting: Family Medicine

## 2021-02-18 VITALS — BP 132/76 | HR 82 | Temp 97.5°F | Ht 65.0 in | Wt >= 6400 oz

## 2021-02-18 DIAGNOSIS — M5432 Sciatica, left side: Secondary | ICD-10-CM | POA: Diagnosis not present

## 2021-02-18 DIAGNOSIS — M62830 Muscle spasm of back: Secondary | ICD-10-CM | POA: Diagnosis not present

## 2021-02-18 LAB — URINALYSIS, ROUTINE W REFLEX MICROSCOPIC
Bilirubin Urine: NEGATIVE
Hgb urine dipstick: NEGATIVE
Ketones, ur: NEGATIVE
Leukocytes,Ua: NEGATIVE
Nitrite: NEGATIVE
RBC / HPF: NONE SEEN (ref 0–?)
Specific Gravity, Urine: 1.025 (ref 1.000–1.030)
Total Protein, Urine: NEGATIVE
Urine Glucose: NEGATIVE
Urobilinogen, UA: 0.2 (ref 0.0–1.0)
pH: 6 (ref 5.0–8.0)

## 2021-02-18 MED ORDER — METHOCARBAMOL 500 MG PO TABS: 500.0000 mg | ORAL_TABLET | Freq: Three times a day (TID) | ORAL | 0 refills | Status: DC | PRN

## 2021-02-18 MED ORDER — KETOROLAC TROMETHAMINE 60 MG/2ML IM SOLN
60.0000 mg | Freq: Once | INTRAMUSCULAR | Status: AC
  Administered 2021-02-18: 60 mg via INTRAMUSCULAR

## 2021-02-18 NOTE — Progress Notes (Signed)
Established Patient Office Visit  Subjective:  Patient ID: Brandy Saunders, female    DOB: Jul 26, 1991  Age: 30 y.o. MRN: [id number]  CC:  Chief Complaint  Patient presents with   Back Pain    C/O lower back pains radiating to laegs x 2 days     HPI Brandy Saunders presents for evaluation of a 2-day history of left-sided back pain.  The pain moves up and down on the left side of her back.  There is been some movement of the pain into the back of her left leg.  There is been no injury.  Seems to be worse with sitting.  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  She has been trying to stand and stretch and walk around as she can.  There has been no numbness or tingling weakness or change in her bowel bladder habits.  Menses was 2 weeks ago.  She has no history of renal lithiasis.  Past Medical History:  Diagnosis Date   Alcohol abuse    Anxiety    Asthma    Back pain    Binge eating    Carpal tunnel syndrome, bilateral    Chest pain    Common migraine with intractable migraine 02/02/2017   Constipation    Depression    Dyspnea    Eczema    Fatty liver    GERD (gastroesophageal reflux disease)    Hypertension    Joint pain    Leg edema    Morbidly obese (HCC)    Palpitations    PCOS (polycystic ovarian syndrome)    Prediabetes    Sleep apnea    Urticaria    Vaginal Pap smear, abnormal    Vitamin D deficiency     Past Surgical History:  Procedure Laterality Date   ADENOIDECTOMY     TONSILECTOMY, ADENOIDECTOMY, BILATERAL MYRINGOTOMY AND TUBES  1994    Family History  Problem Relation Age of Onset   Hypertension Mother    Hyperlipidemia Mother    Obesity Mother    Heart disease Mother    Stroke Father    Obesity Father    Alcoholism Father    Drug abuse Father    Sudden death Father    Diabetes Maternal Uncle    Heart disease Maternal Grandmother    Breast cancer Maternal Aunt    Colon cancer Neg Hx     Social History   Socioeconomic History    Marital status: Significant Other    Spouse name: Engineer, maintenance (IT)   Number of children: 0   Years of education: 16   Highest education level: Not on file  Occupational History   Occupation: Press photographer time Forensic scientist  Tobacco Use   Smoking status: Never   Smokeless tobacco: Never  Vaping Use   Vaping Use: Never used  Substance and Sexual Activity   Alcohol use: Yes    Alcohol/week: 3.0 standard drinks    Types: 3 Glasses of wine per week   Drug use: No    Types: Marijuana    Comment: once every 6 months, taking CBD   Sexual activity: Yes    Partners: Male    Birth control/protection: Condom, Pill    Comment: inconsistent condom use  Other Topics Concern   Not on file  Social History Narrative   Lives with a roommate.     Parents live in [place], [place].   Right handed   Caffeine use: coffee weekly   Social  Determinants of Health   Financial Resource Strain: Not on file  Food Insecurity: Not on file  Transportation Needs: Not on file  Physical Activity: Not on file  Stress: Not on file  Social Connections: Not on file  Intimate Partner Violence: Not on file    Outpatient Medications Prior to Visit  Medication Sig Dispense Refill   acetaminophen (TYLENOL) 500 MG tablet Take 500 mg by mouth every 6 (six) hours as needed.     albuterol (VENTOLIN HFA) 108 (90 Base) MCG/ACT inhaler Inhale into the lungs.     Ascorbic Acid (VITAMIN C) 1000 MG tablet Take 1,000 mg by mouth daily.     BIOTIN 5000 PO Take 10,000 mg by mouth daily.      buPROPion (WELLBUTRIN XL) 300 MG 24 hr tablet Take 1 tablet (300 mg total) by mouth in the morning. 30 tablet 0   cetirizine (ZYRTEC) 10 MG tablet Take 1 tablet (10 mg total) by mouth daily. 30 tablet 5   chlorthalidone (HYGROTON) 25 MG tablet Take 1/2 tablet by mouth once daily. 45 tablet 0   Cholecalciferol (VITAMIN D) 50 MCG (2000 UT) CAPS Take 1 capsule by mouth daily.     ciclesonide (ALVESCO) 160 MCG/ACT inhaler Inhale 1 puff into the lungs 2 (two)  times daily. 1 each 5   Cyanocobalamin (VITAMIN B 12 PO) Take 2,500 mcg by mouth daily.     Insulin Pen Needle (PEN NEEDLES) 32G X 6 MM MISC Use with saxenda daily 100 each 0   Liraglutide -Weight Management (SAXENDA) 18 MG/3ML SOPN INJECT 3 MG DOSE INTO THE SKIN DAILY 15 mL 0   medroxyPROGESTERone (PROVERA) 10 MG tablet Take 1 tablet (10 mg total) by mouth daily. Use for ten days 10 tablet 0   metoprolol succinate (TOPROL-XL) 100 MG 24 hr tablet Take 1 tablet (100 mg total) by mouth daily. Take with or immediately following a meal. 90 tablet 0   Multiple Vitamins-Calcium (ONE-A-DAY WOMENS PO) Take 1 tablet by mouth daily.      polyethylene glycol (MIRALAX / GLYCOLAX) 17 g packet Take 17 g by mouth daily as needed (constipation).     triamcinolone ointment (KENALOG) 0.1 % Apply thin layer to affected areas 2-3 times daily during flares. 80 g 2   Vitamin D, Ergocalciferol, (DRISDOL) 1.25 MG (50000 UNIT) CAPS capsule Take 1 capsule (50,000 Units total) by mouth every 7 (seven) days. 12 capsule 0   No facility-administered medications prior to visit.    Allergies  Allergen Reactions   Ibuprofen Diarrhea   Meloxicam Other (See Comments)    Pt stated, "It made my emotions out of wack; it made my left hand numb" Pt stated, "It made my emotions out of wack; it made my left hand numb" Pt stated, "It made my emotions out of wack; it made my left hand numb" Pt stated, "It made my emotions out of wack; it made my left hand numb"     ROS Review of Systems  Constitutional:  Negative for diaphoresis, fatigue, fever and unexpected weight change.  Respiratory: Negative.    Cardiovascular: Negative.   Gastrointestinal: Negative.   Genitourinary:  Negative for difficulty urinating, frequency and hematuria.  Musculoskeletal:  Positive for back pain and myalgias.  Neurological:  Negative for weakness and numbness.     Objective:    Physical Exam Vitals and nursing note reviewed.  Constitutional:       General: She is not in acute distress.    Appearance: Normal  appearance. She is not ill-appearing, toxic-appearing or diaphoretic.  HENT:     Head: Normocephalic and atraumatic.     Right Ear: External ear normal.     Left Ear: External ear normal.  Eyes:     General: No scleral icterus.       Right eye: No discharge.        Left eye: No discharge.     Conjunctiva/sclera: Conjunctivae normal.  Pulmonary:     Effort: Pulmonary effort is normal.  Musculoskeletal:     Thoracic back: Tenderness present.       Back:  Skin:    General: Skin is warm and dry.  Neurological:     Mental Status: She is alert and oriented to person, place, and time.  Psychiatric:        Mood and Affect: Mood normal.        Behavior: Behavior normal.    BP 132/76   Pulse 82   Temp (!) 97.5 F (36.4 C) (Temporal)   Ht 5\' 5"  (1.651 m)   Wt (!) 452 lb 9.6 oz (205.3 kg)   SpO2 97%   BMI 75.32 kg/m  Wt Readings from Last 3 Encounters:  02/18/21 (!) 452 lb 9.6 oz (205.3 kg)  02/14/21 (!) 452 lb (205 kg)  01/17/21 (!) 459 lb (208.2 kg)     There are no preventive care reminders to display for this patient.  There are no preventive care reminders to display for this patient.  Lab Results  Component Value Date   TSH 1.90 02/26/2020   Lab Results  Component Value Date   WBC 5.6 07/18/2020   HGB 13.8 07/18/2020   HCT 42.5 07/18/2020   MCV 86.4 07/18/2020   PLT 234 07/18/2020   Lab Results  Component Value Date   NA 137 07/18/2020   K 3.9 07/18/2020   CO2 24 07/18/2020   GLUCOSE 112 (H) 07/18/2020   BUN 13 07/18/2020   CREATININE 0.90 07/18/2020   BILITOT 0.7 07/18/2020   ALKPHOS 48 07/18/2020   AST 26 07/18/2020   ALT 38 07/18/2020   PROT 7.5 07/18/2020   ALBUMIN 4.2 07/18/2020   CALCIUM 8.9 07/18/2020   ANIONGAP 9 07/18/2020   GFR 109.86 02/26/2020   Lab Results  Component Value Date   CHOL 165 02/26/2020   Lab Results  Component Value Date   HDL 57.50 02/26/2020    Lab Results  Component Value Date   LDLCALC 87 02/26/2020   Lab Results  Component Value Date   TRIG 104.0 02/26/2020   Lab Results  Component Value Date   CHOLHDL 3 02/26/2020   Lab Results  Component Value Date   HGBA1C 5.6 12/03/2019      Assessment & Plan:   Problem List Items Addressed This Visit       Nervous and Auditory   Sciatica of left side   Relevant Medications   methocarbamol (ROBAXIN) 500 MG tablet     Other   Spasm of thoracic back muscle - Primary   Relevant Medications   methocarbamol (ROBAXIN) 500 MG tablet   Other Relevant Orders   Urinalysis, Routine w reflex microscopic    Meds ordered this encounter  Medications   ketorolac (TORADOL) injection 60 mg   methocarbamol (ROBAXIN) 500 MG tablet    Sig: Take 1 tablet (500 mg total) by mouth every 8 (eight) hours as needed for muscle spasms.    Dispense:  40 tablet    Refill:  0  Follow-up: Return follow up with Dr. Loletha Grayer next week as needed..   Presentation does not sound or look like lithiasis but will check UA.  Asked her for clean-catch.  Given information on exercises for back pain.  Follow-up as needed Libby Maw, MD

## 2021-02-21 ENCOUNTER — Encounter (INDEPENDENT_AMBULATORY_CARE_PROVIDER_SITE_OTHER): Payer: Self-pay | Admitting: Family Medicine

## 2021-02-21 NOTE — Telephone Encounter (Signed)
Last OV with Dr. Beasley 

## 2021-02-22 ENCOUNTER — Other Ambulatory Visit (INDEPENDENT_AMBULATORY_CARE_PROVIDER_SITE_OTHER): Payer: Self-pay | Admitting: Family Medicine

## 2021-02-22 DIAGNOSIS — Z6841 Body Mass Index (BMI) 40.0 and over, adult: Secondary | ICD-10-CM

## 2021-02-22 DIAGNOSIS — E66813 Obesity, class 3: Secondary | ICD-10-CM

## 2021-02-22 NOTE — Telephone Encounter (Signed)
Last OV with Dr. Beasley 

## 2021-02-23 ENCOUNTER — Encounter: Payer: BC Managed Care – PPO | Admitting: Occupational Therapy

## 2021-02-23 NOTE — Telephone Encounter (Signed)
Patient is requesting a refill of the following medications: Requested Prescriptions   Pending Prescriptions Disp Refills   SAXENDA 18 MG/3ML SOPN [Pharmacy Med Name: SAXENDA 18 MG/3 ML PEN]      Sig: INJECT 3 MG DOSE INTO THE SKIN DAILY    Last office visit: 02/14/21 Date of last refill: 01/17/21 Last refill amount: 79ml Follow up time period per chart: 3 week Next appt:03/09/21

## 2021-02-25 ENCOUNTER — Encounter: Payer: BC Managed Care – PPO | Admitting: Occupational Therapy

## 2021-02-26 ENCOUNTER — Other Ambulatory Visit (INDEPENDENT_AMBULATORY_CARE_PROVIDER_SITE_OTHER): Payer: Self-pay | Admitting: Family Medicine

## 2021-03-09 ENCOUNTER — Ambulatory Visit (INDEPENDENT_AMBULATORY_CARE_PROVIDER_SITE_OTHER): Payer: BC Managed Care – PPO | Admitting: Family Medicine

## 2021-03-09 ENCOUNTER — Encounter (INDEPENDENT_AMBULATORY_CARE_PROVIDER_SITE_OTHER): Payer: Self-pay | Admitting: Family Medicine

## 2021-03-09 ENCOUNTER — Other Ambulatory Visit (INDEPENDENT_AMBULATORY_CARE_PROVIDER_SITE_OTHER): Payer: Self-pay | Admitting: Family Medicine

## 2021-03-09 ENCOUNTER — Other Ambulatory Visit: Payer: Self-pay

## 2021-03-09 VITALS — BP 110/81 | HR 78 | Temp 97.8°F | Ht 65.0 in | Wt >= 6400 oz

## 2021-03-09 DIAGNOSIS — G44211 Episodic tension-type headache, intractable: Secondary | ICD-10-CM

## 2021-03-09 DIAGNOSIS — Z9189 Other specified personal risk factors, not elsewhere classified: Secondary | ICD-10-CM | POA: Diagnosis not present

## 2021-03-09 DIAGNOSIS — E559 Vitamin D deficiency, unspecified: Secondary | ICD-10-CM | POA: Diagnosis not present

## 2021-03-09 DIAGNOSIS — I10 Essential (primary) hypertension: Secondary | ICD-10-CM | POA: Diagnosis not present

## 2021-03-09 DIAGNOSIS — Z6841 Body Mass Index (BMI) 40.0 and over, adult: Secondary | ICD-10-CM

## 2021-03-09 MED ORDER — METOPROLOL SUCCINATE ER 100 MG PO TB24: 100.0000 mg | ORAL_TABLET | Freq: Every day | ORAL | 0 refills | Status: DC

## 2021-03-09 MED ORDER — VITAMIN D (ERGOCALCIFEROL) 1.25 MG (50000 UNIT) PO CAPS: 50000.0000 [IU] | ORAL_CAPSULE | ORAL | 0 refills | Status: DC

## 2021-03-10 ENCOUNTER — Other Ambulatory Visit (INDEPENDENT_AMBULATORY_CARE_PROVIDER_SITE_OTHER): Payer: Self-pay | Admitting: Emergency Medicine

## 2021-03-10 NOTE — Telephone Encounter (Signed)
LAST APPOINTMENT DATE: 03/09/2021  NEXT APPOINTMENT DATE: 03/30/2021   [id number] [place] [place] [address] [address] Phone: [phone] Fax: [phone]  [id number] [name], [place] [address] [place] [address] Phone: [phone] Fax: [phone]  Patient is requesting a refill of the following medications: Requested Prescriptions   Pending Prescriptions Disp Refills   chlorthalidone (HYGROTON) 25 MG tablet [Pharmacy Med Name: CHLORTHALIDONE 25 MG TABLET] 15 tablet 2    Sig: TAKE 1/2 TABLET BY MOUTH EVERY DAY    Date last filled: 12/06/20 Previously prescribed by [facility]   Lab Results  Component Value Date   HGBA1C 5.6 12/03/2019   HGBA1C 5.8 (H) 06/17/2019   HGBA1C 5.8 (H) 05/02/2018   Lab Results  Component Value Date   LDLCALC 87 02/26/2020   CREATININE 0.90 07/18/2020   Lab Results  Component Value Date   VD25OH 34.27 02/26/2020   VD25OH 39.8 12/03/2019   VD25OH 28.1 (L) 06/17/2019    BP Readings from Last 3 Encounters:  03/09/21 110/81  02/18/21 132/76  02/14/21 100/60

## 2021-03-10 NOTE — Telephone Encounter (Signed)
Last OV with Dr. Beasley 

## 2021-03-14 NOTE — Telephone Encounter (Signed)
Dr.Beasley 

## 2021-03-16 ENCOUNTER — Telehealth (INDEPENDENT_AMBULATORY_CARE_PROVIDER_SITE_OTHER): Payer: Self-pay | Admitting: Emergency Medicine

## 2021-03-16 NOTE — Progress Notes (Signed)
Chief Complaint:   OBESITY Brandy Saunders is here to discuss her progress with her obesity treatment plan along with follow-up of her obesity related diagnoses. Brandy Saunders is on keeping a food journal and adhering to recommended goals of 1500-1800 calories and 100+ grams of protein daily and states she is following her eating plan approximately 70% of the time. Brandy Saunders states she is walking the dog for 15 minutes 5 times per week.  Today's visit was #: 66 Starting weight: 447 lbs Starting date: 10/11/2017 Today's weight: 445 lbs Today's date: 03/09/2021 Total lbs lost to date: 2 Total lbs lost since last in-office visit: 7  Interim History: Brandy Saunders continues to do well with weight loss. She notes her hunger is controlled, but she notes some increased temptations and she feels she is doing better resisting temptations.  Subjective:   1. Vitamin D deficiency Brandy Saunders is stable on Vit D, and she denies nausea, vomiting, or muscle weakness.  2. Essential hypertension Brandy Saunders's blood pressure is stable on her medications and has been improving with weight loss.  3. Intractable episodic tension-type headache Brandy Saunders has a history of migraine headaches, but she has a newer occipital headache. She is increasing her hydration but with no improvement.  4. At risk for heart disease Brandy Saunders is at a higher than average risk for cardiovascular disease due to obesity.   Assessment/Plan:   1. Vitamin D deficiency Low Vitamin D level contributes to fatigue and are associated with obesity, breast, and colon cancer. We will refill prescription Vitamin D for 90 days with no refills. Brandy Saunders will follow-up for routine testing of Vitamin D, at least 2-3 times per year to avoid over-replacement.  - Vitamin D, Ergocalciferol, (DRISDOL) 1.25 MG (50000 UNIT) CAPS capsule; Take 1 capsule (50,000 Units total) by mouth every 7 (seven) days.  Dispense: 12 capsule; Refill: 0  2. Essential  hypertension Brandy Saunders will continue working on healthy weight loss and exercise to improve blood pressure control. We will watch for signs of hypotension as she continues her lifestyle modifications. We will refill metoprolol for 90 days with no refills.  - metoprolol succinate (TOPROL-XL) 100 MG 24 hr tablet; Take 1 tablet (100 mg total) by mouth daily. Take with or immediately following a meal.  Dispense: 90 tablet; Refill: 0  3. Intractable episodic tension-type headache We will refer Brandy Saunders to Dr. Jaynee Eagles for evaluation and options.  - Ambulatory referral to Neurology  4. At risk for heart disease Brandy Saunders was given approximately 15 minutes of coronary artery disease prevention counseling today. She is 30 y.o. female and has risk factors for heart disease including obesity. We discussed intensive lifestyle modifications today with an emphasis on specific weight loss instructions and strategies.   Repetitive spaced learning was employed today to elicit superior memory formation and behavioral change.  5. Obesity with current BMI 74.2 Brandy Saunders is currently in the action stage of change. As such, her goal is to continue with weight loss efforts. She has agreed to keeping a food journal and adhering to recommended goals of 1500-1800 calories and 100 grams of protein daily.   Exercise goals: As is.  Behavioral modification strategies: increasing lean protein intake and increasing water intake.  Brandy Saunders has agreed to follow-up with our clinic in 3 weeks. She was informed of the importance of frequent follow-up visits to maximize her success with intensive lifestyle modifications for her multiple health conditions.   Objective:   Blood pressure 110/81, pulse 78, temperature 97.8 F (36.6 C), height  5\' 5"  (1.651 m), weight (!) 445 lb (201.9 kg), SpO2 97 %. Body mass index is 74.05 kg/m.  General: Cooperative, alert, well developed, in no acute distress. HEENT: Conjunctivae and lids  unremarkable. Cardiovascular: Regular rhythm.  Lungs: Normal work of breathing. Neurologic: No focal deficits.   Lab Results  Component Value Date   CREATININE 0.90 07/18/2020   BUN 13 07/18/2020   NA 137 07/18/2020   K 3.9 07/18/2020   CL 104 07/18/2020   CO2 24 07/18/2020   Lab Results  Component Value Date   ALT 38 07/18/2020   AST 26 07/18/2020   ALKPHOS 48 07/18/2020   BILITOT 0.7 07/18/2020   Lab Results  Component Value Date   HGBA1C 5.6 12/03/2019   HGBA1C 5.8 (H) 06/17/2019   HGBA1C 5.8 (H) 05/02/2018   HGBA1C 5.7 (H) 01/17/2018   HGBA1C 6.0 (H) 10/11/2017   Lab Results  Component Value Date   INSULIN 30.8 (H) 12/03/2019   INSULIN 35.9 (H) 06/17/2019   INSULIN 30.3 (H) 05/02/2018   INSULIN 39.5 (H) 01/17/2018   INSULIN 73.6 (H) 10/11/2017   Lab Results  Component Value Date   TSH 1.90 02/26/2020   Lab Results  Component Value Date   CHOL 165 02/26/2020   HDL 57.50 02/26/2020   LDLCALC 87 02/26/2020   TRIG 104.0 02/26/2020   CHOLHDL 3 02/26/2020   Lab Results  Component Value Date   VD25OH 34.27 02/26/2020   VD25OH 39.8 12/03/2019   VD25OH 28.1 (L) 06/17/2019   Lab Results  Component Value Date   WBC 5.6 07/18/2020   HGB 13.8 07/18/2020   HCT 42.5 07/18/2020   MCV 86.4 07/18/2020   PLT 234 07/18/2020   No results found for: IRON, TIBC, FERRITIN  Attestation Statements:   Reviewed by clinician on day of visit: allergies, medications, problem list, medical history, surgical history, family history, social history, and previous encounter notes.   I, Brandy Saunders, am acting as transcriptionist for Dennard Nip, MD.  I have reviewed the above documentation for accuracy and completeness, and I agree with the above. -  Dennard Nip, MD

## 2021-03-16 NOTE — Telephone Encounter (Signed)
Dr Leafy Ro patient would like to know since she has picked up a more on my feet job but they need a note saying I can wear athletic shoes like sneakers. Is that something you can provide?

## 2021-03-17 ENCOUNTER — Encounter (INDEPENDENT_AMBULATORY_CARE_PROVIDER_SITE_OTHER): Payer: Self-pay | Admitting: Emergency Medicine

## 2021-03-17 NOTE — Telephone Encounter (Signed)
Msg sent via mycahrt.

## 2021-03-21 ENCOUNTER — Other Ambulatory Visit (INDEPENDENT_AMBULATORY_CARE_PROVIDER_SITE_OTHER): Payer: Self-pay | Admitting: Family Medicine

## 2021-03-21 ENCOUNTER — Encounter (INDEPENDENT_AMBULATORY_CARE_PROVIDER_SITE_OTHER): Payer: Self-pay | Admitting: Emergency Medicine

## 2021-03-21 DIAGNOSIS — F3289 Other specified depressive episodes: Secondary | ICD-10-CM

## 2021-03-21 NOTE — Telephone Encounter (Signed)
LAST APPOINTMENT DATE: 03/09/21 NEXT APPOINTMENT DATE: 03/30/21   [id number] [place] [address]: [phone]: [phone] [id number] [place] [address]: [phone]: [phone] is requesting a refill of the following medications: Requested Prescriptions   Pending Prescriptions Disp Refills   buPROPion (WELLBUTRIN XL) 300 MG 24 hr tablet [Pharmacy Med Name: BUPROPION HCL XL 300 MG TABLET] 90 tablet 1    Sig: TAKE 1 TABLET BY MOUTH EVERY MORNING    Date last filled: 02/14/21 [facility]  Component Value Date   HGBA1C 5.6 12/03/2019   HGBA1C 5.8 (H) 06/17/2019   HGBA1C 5.8 (H) 05/02/2018   Lab Results  Component Value Date   LDLCALC 87 02/26/2020   CREATININE 0.90 07/18/2020   Lab Results  Component Value Date   VD25OH 34.27 02/26/2020   VD25OH 39.8 12/03/2019   VD25OH 28.1 (L) 06/17/2019    BP Readings from Last 3 Encounters:  03/09/21 110/81  02/18/21 132/76  02/14/21 100/60

## 2021-03-21 NOTE — Telephone Encounter (Signed)
Last OV with Dr. Beasley 

## 2021-03-21 NOTE — Telephone Encounter (Signed)
Msg sent via mychart

## 2021-03-23 ENCOUNTER — Other Ambulatory Visit (INDEPENDENT_AMBULATORY_CARE_PROVIDER_SITE_OTHER): Payer: Self-pay | Admitting: Emergency Medicine

## 2021-03-23 ENCOUNTER — Encounter (INDEPENDENT_AMBULATORY_CARE_PROVIDER_SITE_OTHER): Payer: Self-pay | Admitting: Emergency Medicine

## 2021-03-23 NOTE — Telephone Encounter (Signed)
Another msg has been sent via mychart in reference what patient need the work note to say about her wearing sneakers

## 2021-03-23 NOTE — Progress Notes (Signed)
Patient was sent a msg letting her know letter has been sent to [facility]

## 2021-03-25 ENCOUNTER — Other Ambulatory Visit (INDEPENDENT_AMBULATORY_CARE_PROVIDER_SITE_OTHER): Payer: Self-pay | Admitting: Family Medicine

## 2021-03-25 DIAGNOSIS — Z6841 Body Mass Index (BMI) 40.0 and over, adult: Secondary | ICD-10-CM

## 2021-03-25 DIAGNOSIS — F3289 Other specified depressive episodes: Secondary | ICD-10-CM

## 2021-03-28 NOTE — Telephone Encounter (Signed)
Dr.Beasley 

## 2021-03-28 NOTE — Telephone Encounter (Signed)
Pt last seen by Dr. Beasley.  

## 2021-03-28 NOTE — Telephone Encounter (Signed)
Will refill at 03/30/21 visit

## 2021-03-30 ENCOUNTER — Encounter (INDEPENDENT_AMBULATORY_CARE_PROVIDER_SITE_OTHER): Payer: Self-pay | Admitting: Family Medicine

## 2021-03-30 ENCOUNTER — Ambulatory Visit (INDEPENDENT_AMBULATORY_CARE_PROVIDER_SITE_OTHER): Payer: BC Managed Care – PPO | Admitting: Family Medicine

## 2021-03-30 ENCOUNTER — Other Ambulatory Visit: Payer: Self-pay

## 2021-03-30 VITALS — BP 96/63 | HR 54 | Temp 97.9°F | Ht 65.0 in | Wt >= 6400 oz

## 2021-03-30 DIAGNOSIS — E559 Vitamin D deficiency, unspecified: Secondary | ICD-10-CM | POA: Diagnosis not present

## 2021-03-30 DIAGNOSIS — Z6841 Body Mass Index (BMI) 40.0 and over, adult: Secondary | ICD-10-CM

## 2021-03-30 DIAGNOSIS — F3289 Other specified depressive episodes: Secondary | ICD-10-CM | POA: Diagnosis not present

## 2021-03-30 DIAGNOSIS — Z9189 Other specified personal risk factors, not elsewhere classified: Secondary | ICD-10-CM

## 2021-03-30 DIAGNOSIS — I10 Essential (primary) hypertension: Secondary | ICD-10-CM

## 2021-03-30 MED ORDER — BUPROPION HCL ER (XL) 300 MG PO TB24: 300.0000 mg | ORAL_TABLET | Freq: Every morning | ORAL | 0 refills | Status: DC

## 2021-03-30 MED ORDER — CHLORTHALIDONE 25 MG PO TABS: ORAL_TABLET | ORAL | 2 refills | Status: DC

## 2021-03-30 MED ORDER — METOPROLOL SUCCINATE ER 100 MG PO TB24: 100.0000 mg | ORAL_TABLET | Freq: Every day | ORAL | 0 refills | Status: DC

## 2021-03-30 MED ORDER — VITAMIN D (ERGOCALCIFEROL) 1.25 MG (50000 UNIT) PO CAPS: 50000.0000 [IU] | ORAL_CAPSULE | ORAL | 0 refills | Status: DC

## 2021-03-30 NOTE — Patient Instructions (Signed)
Health Maintenance Due  Topic Date Due   INFLUENZA VACCINE  03/28/2021    Depression screen [facility] 2/9 02/18/2021 02/26/2020 06/17/2019  Decreased Interest 0 2 3  Down, Depressed, Hopeless 0 2 3  PHQ - 2 Score 0 4 6  Altered sleeping - 2 3  Tired, decreased energy - 3 3  Change in appetite - 3 3  Feeling bad or failure about yourself  - 2 3  Trouble concentrating - 2 3  Moving slowly or fidgety/restless - 0 2  Suicidal thoughts - 0 1  PHQ-9 Score - 16 24  Difficult doing work/chores - Somewhat difficult Very difficult  Some recent data might be hidden

## 2021-04-04 NOTE — Progress Notes (Signed)
Chief Complaint:   OBESITY Brandy Saunders is here to discuss her progress with her obesity treatment plan along with follow-up of her obesity related diagnoses. Brandy Saunders is on keeping a food journal and adhering to recommended goals of 1500-1800 calories and 100 grams of protein daily and states she is following her eating plan approximately 50% of the time. Brandy Saunders states she is walking the dog for 20 minutes 7 times per week.  Today's visit was #: 48 Starting weight: 447 lbs Starting date: 10/11/2017 Today's weight: 441 lbs Today's date: 03/30/2021 Total lbs lost to date: 6 Total lbs lost since last in-office visit: 4  Interim History: Brandy Saunders continues to do well with weight loss. She is being more active and working on increasing her protein. Her birthday is soon and she is working on making some damage  control strategies.  Subjective:   1. Essential hypertension Brandy Saunders's blood pressure is stable on her medications. She denies chest pain, headache, or dizziness.  2. Vitamin D deficiency Brandy Saunders is stable on Vit D, and she denies nausea, vomiting, or muscle weakness.  3. Other depression with emotional eating Brandy Saunders continues to do well with diet and exercise, and she is more mindful of her emotional eating behaviors.  4. At risk for impaired metabolic function Brandy Saunders is at increased risk for impaired metabolic function if she starts skipping  meals when she starts her part time job.  Assessment/Plan:   1. Essential hypertension Brandy Saunders will continue working on healthy weight loss and exercise to improve blood pressure control. We will watch for signs of hypotension as she continues her lifestyle modifications. We will refill chlorthalidone, and metoprolol for 90 days with no refills.  - chlorthalidone (HYGROTON) 25 MG tablet; TAKE 1/2 TABLET BY MOUTH EVERY DAY  Dispense: 15 tablet; Refill: 2 - metoprolol succinate (TOPROL-XL) 100 MG 24 hr tablet; Take 1 tablet (100 mg  total) by mouth daily. Take with or immediately following a meal.  Dispense: 90 tablet; Refill: 0  2. Vitamin D deficiency Low Vitamin D level contributes to fatigue and are associated with obesity, breast, and colon cancer. We will refill prescription Vitamin D for 90 days. Reminisce will follow-up for routine testing of Vitamin D, at least 2-3 times per year to avoid over-replacement.  - Vitamin D, Ergocalciferol, (DRISDOL) 1.25 MG (50000 UNIT) CAPS capsule; Take 1 capsule (50,000 Units total) by mouth every 7 (seven) days.  Dispense: 12 capsule; Refill: 0  3. Other depression with emotional eating Behavior modification techniques were discussed today to help Brandy Saunders deal with her emotional/non-hunger eating behaviors. We will refill Wellbutrin XL for 1 month. Orders and follow up as documented in patient record.   - buPROPion (WELLBUTRIN XL) 300 MG 24 hr tablet; Take 1 tablet (300 mg total) by mouth in the morning.  Dispense: 30 tablet; Refill: 0  4. At risk for impaired metabolic function Brandy Saunders was given approximately 15 minutes of impaired  metabolic function prevention counseling today. We discussed intensive lifestyle modifications today with an emphasis on specific nutrition and exercise instructions and strategies.   Repetitive spaced learning was employed today to elicit superior memory formation and behavioral change.  5. Obesity with current BMI 73.5 Brandy Saunders is currently in the action stage of change. As such, her goal is to continue with weight loss efforts. She has agreed to keeping a food journal and adhering to recommended goals of 1500-1800 calories and 100 grams of protein daily.   Exercise goals: As is.  Behavioral  modification strategies: increasing lean protein intake and celebration eating strategies.  Brandy Saunders has agreed to follow-up with our clinic in 3 weeks. She was informed of the importance of frequent follow-up visits to maximize her success with intensive  lifestyle modifications for her multiple health conditions.   Objective:   Blood pressure 96/63, pulse (!) 54, temperature 97.9 F (36.6 C), height '5\' 5"'$  (1.651 m), weight (!) 441 lb (200 kg), SpO2 97 %. Body mass index is 73.39 kg/m.  General: Cooperative, alert, well developed, in no acute distress. HEENT: Conjunctivae and lids unremarkable. Cardiovascular: Regular rhythm.  Lungs: Normal work of breathing. Neurologic: No focal deficits.   Lab Results  Component Value Date   CREATININE 0.90 07/18/2020   BUN 13 07/18/2020   NA 137 07/18/2020   K 3.9 07/18/2020   CL 104 07/18/2020   CO2 24 07/18/2020   Lab Results  Component Value Date   ALT 38 07/18/2020   AST 26 07/18/2020   ALKPHOS 48 07/18/2020   BILITOT 0.7 07/18/2020   Lab Results  Component Value Date   HGBA1C 5.6 12/03/2019   HGBA1C 5.8 (H) 06/17/2019   HGBA1C 5.8 (H) 05/02/2018   HGBA1C 5.7 (H) 01/17/2018   HGBA1C 6.0 (H) 10/11/2017   Lab Results  Component Value Date   INSULIN 30.8 (H) 12/03/2019   INSULIN 35.9 (H) 06/17/2019   INSULIN 30.3 (H) 05/02/2018   INSULIN 39.5 (H) 01/17/2018   INSULIN 73.6 (H) 10/11/2017   Lab Results  Component Value Date   TSH 1.90 02/26/2020   Lab Results  Component Value Date   CHOL 165 02/26/2020   HDL 57.50 02/26/2020   LDLCALC 87 02/26/2020   TRIG 104.0 02/26/2020   CHOLHDL 3 02/26/2020   Lab Results  Component Value Date   VD25OH 34.27 02/26/2020   VD25OH 39.8 12/03/2019   VD25OH 28.1 (L) 06/17/2019   Lab Results  Component Value Date   WBC 5.6 07/18/2020   HGB 13.8 07/18/2020   HCT 42.5 07/18/2020   MCV 86.4 07/18/2020   PLT 234 07/18/2020   No results found for: IRON, TIBC, FERRITIN  Attestation Statements:   Reviewed by clinician on day of visit: allergies, medications, problem list, medical history, surgical history, family history, social history, and previous encounter notes.   I, Trixie Dredge, am acting as transcriptionist for Dennard Nip, MD.  I have reviewed the above documentation for accuracy and completeness, and I agree with the above. -  Dennard Nip, MD

## 2021-04-05 DIAGNOSIS — F4320 Adjustment disorder, unspecified: Secondary | ICD-10-CM | POA: Diagnosis not present

## 2021-04-13 DIAGNOSIS — Z411 Encounter for cosmetic surgery: Secondary | ICD-10-CM | POA: Diagnosis not present

## 2021-04-13 DIAGNOSIS — D239 Other benign neoplasm of skin, unspecified: Secondary | ICD-10-CM | POA: Diagnosis not present

## 2021-04-13 DIAGNOSIS — L83 Acanthosis nigricans: Secondary | ICD-10-CM | POA: Diagnosis not present

## 2021-04-20 ENCOUNTER — Other Ambulatory Visit: Payer: Self-pay

## 2021-04-20 ENCOUNTER — Ambulatory Visit (INDEPENDENT_AMBULATORY_CARE_PROVIDER_SITE_OTHER): Payer: BC Managed Care – PPO | Admitting: Family Medicine

## 2021-04-20 ENCOUNTER — Encounter (INDEPENDENT_AMBULATORY_CARE_PROVIDER_SITE_OTHER): Payer: Self-pay | Admitting: Family Medicine

## 2021-04-20 VITALS — BP 119/81 | HR 75 | Temp 97.7°F | Ht 65.0 in | Wt >= 6400 oz

## 2021-04-20 DIAGNOSIS — E559 Vitamin D deficiency, unspecified: Secondary | ICD-10-CM

## 2021-04-20 DIAGNOSIS — Z9189 Other specified personal risk factors, not elsewhere classified: Secondary | ICD-10-CM | POA: Diagnosis not present

## 2021-04-20 DIAGNOSIS — F3289 Other specified depressive episodes: Secondary | ICD-10-CM

## 2021-04-20 DIAGNOSIS — I10 Essential (primary) hypertension: Secondary | ICD-10-CM

## 2021-04-20 DIAGNOSIS — Z6841 Body Mass Index (BMI) 40.0 and over, adult: Secondary | ICD-10-CM

## 2021-04-20 MED ORDER — BUPROPION HCL ER (XL) 300 MG PO TB24: 300.0000 mg | ORAL_TABLET | Freq: Every morning | ORAL | 0 refills | Status: DC

## 2021-04-20 MED ORDER — METOPROLOL SUCCINATE ER 100 MG PO TB24: 100.0000 mg | ORAL_TABLET | Freq: Every day | ORAL | 0 refills | Status: DC

## 2021-04-20 MED ORDER — VITAMIN D (ERGOCALCIFEROL) 1.25 MG (50000 UNIT) PO CAPS: 50000.0000 [IU] | ORAL_CAPSULE | ORAL | 0 refills | Status: DC

## 2021-04-20 MED ORDER — CHLORTHALIDONE 25 MG PO TABS: ORAL_TABLET | ORAL | 0 refills | Status: DC

## 2021-04-21 NOTE — Progress Notes (Signed)
Chief Complaint:   OBESITY Brandy Saunders is here to discuss her progress with her obesity treatment plan along with follow-up of her obesity related diagnoses. Brandy Saunders is on keeping a food journal and adhering to recommended goals of 1500-1800 calories and 100 grams of protein daily and states she is following her eating plan approximately 30% of the time. Brandy Saunders states she is walking for 15 minutes 7 times per week.  Today's visit was #: 80 Starting weight: 447 lbs Starting date: 10/11/2017 Today's weight: 440 lbs Today's date: 04/20/2021 Total lbs lost to date: 7 Total lbs lost since last in-office visit: 1  Interim History: Brandy Saunders notes increased temptations and increased stress with planning for her wedding. She is on Saxenda and she is tolerating it reasonably well.   Subjective:   1. Essential hypertension Brandy Saunders's blood pressure is stable on her medications, and she denies chest pain or headache.  2. Vitamin D deficiency Brandy Saunders is stable on Vit D, and she denies signs of over-replacement.   3. Other depression with emotional eating Brandy Saunders is doing well on her medications, and she has increased stress and temptations but she is mindful of this and trying to portion control better.  4. At risk for impaired metabolic function Brandy Saunders is at increased risk for impaired metabolic function if protein decreases.  Assessment/Plan:   1. Essential hypertension Brandy Saunders is working on healthy weight loss and exercise to improve blood pressure control. We will refill chlorthalidone and Toprol XL for 1 month, and will watch for signs of hypotension as she continues her lifestyle modifications.  - chlorthalidone (HYGROTON) 25 MG tablet; TAKE 1/2 TABLET BY MOUTH EVERY DAY  Dispense: 15 tablet; Refill: 0 - metoprolol succinate (TOPROL-XL) 100 MG 24 hr tablet; Take 1 tablet (100 mg total) by mouth daily. Take with or immediately following a meal.  Dispense: 30 tablet; Refill:  0  2. Vitamin D deficiency Low Vitamin D level contributes to fatigue and are associated with obesity, breast, and colon cancer. We will refill prescription Vitamin D for 1 month. Brandy Saunders will follow-up for routine testing of Vitamin D, at least 2-3 times per year to avoid over-replacement.  - Vitamin D, Ergocalciferol, (DRISDOL) 1.25 MG (50000 UNIT) CAPS capsule; Take 1 capsule (50,000 Units total) by mouth every 7 (seven) days for 28 days.  Dispense: 4 capsule; Refill: 0  3. Other depression with emotional eating Behavior modification techniques were discussed today to help Brandy Saunders deal with her emotional/non-hunger eating behaviors. We will refill Wellbutrin XL for 1 month. Orders and follow up as documented in patient record.   - buPROPion (WELLBUTRIN XL) 300 MG 24 hr tablet; Take 1 tablet (300 mg total) by mouth in the morning.  Dispense: 30 tablet; Refill: 0  4. At risk for impaired metabolic function Brandy Saunders was given approximately 15 minutes of impaired  metabolic function prevention counseling today. We discussed intensive lifestyle modifications today with an emphasis on specific nutrition and exercise instructions and strategies.   Repetitive spaced learning was employed today to elicit superior memory formation and behavioral change.  5. Obesity with current BMI 73.3 Brandy Saunders is currently in the action stage of change. As such, her goal is to continue with weight loss efforts. She has agreed to keeping a food journal and adhering to recommended goals of 1500-1800 calories and 100+ grams of protein daily.   We discussed various medication options to help Brandy Saunders with her weight loss efforts and we both agreed to continue Saxenda as is,  and will continue to follow up.   Natural probiotic rich foods were dicussed today.  Exercise goals: As is.  Behavioral modification strategies: increasing lean protein intake and no skipping meals.  Brandy Saunders has agreed to follow-up with our  clinic in 3 weeks. She was informed of the importance of frequent follow-up visits to maximize her success with intensive lifestyle modifications for her multiple health conditions.   Objective:   Blood pressure 119/81, pulse 75, temperature 97.7 F (36.5 C), height '5\' 5"'$  (1.651 m), weight (!) 440 lb (199.6 kg), SpO2 100 %. Body mass index is 73.22 kg/m.  General: Cooperative, alert, well developed, in no acute distress. HEENT: Conjunctivae and lids unremarkable. Cardiovascular: Regular rhythm.  Lungs: Normal work of breathing. Neurologic: No focal deficits.   Lab Results  Component Value Date   CREATININE 0.90 07/18/2020   BUN 13 07/18/2020   NA 137 07/18/2020   K 3.9 07/18/2020   CL 104 07/18/2020   CO2 24 07/18/2020   Lab Results  Component Value Date   ALT 38 07/18/2020   AST 26 07/18/2020   ALKPHOS 48 07/18/2020   BILITOT 0.7 07/18/2020   Lab Results  Component Value Date   HGBA1C 5.6 12/03/2019   HGBA1C 5.8 (H) 06/17/2019   HGBA1C 5.8 (H) 05/02/2018   HGBA1C 5.7 (H) 01/17/2018   HGBA1C 6.0 (H) 10/11/2017   Lab Results  Component Value Date   INSULIN 30.8 (H) 12/03/2019   INSULIN 35.9 (H) 06/17/2019   INSULIN 30.3 (H) 05/02/2018   INSULIN 39.5 (H) 01/17/2018   INSULIN 73.6 (H) 10/11/2017   Lab Results  Component Value Date   TSH 1.90 02/26/2020   Lab Results  Component Value Date   CHOL 165 02/26/2020   HDL 57.50 02/26/2020   LDLCALC 87 02/26/2020   TRIG 104.0 02/26/2020   CHOLHDL 3 02/26/2020   Lab Results  Component Value Date   VD25OH 34.27 02/26/2020   VD25OH 39.8 12/03/2019   VD25OH 28.1 (L) 06/17/2019   Lab Results  Component Value Date   WBC 5.6 07/18/2020   HGB 13.8 07/18/2020   HCT 42.5 07/18/2020   MCV 86.4 07/18/2020   PLT 234 07/18/2020   No results found for: IRON, TIBC, FERRITIN  Attestation Statements:   Reviewed by clinician on day of visit: allergies, medications, problem list, medical history, surgical history, family  history, social history, and previous encounter notes.   I, Trixie Dredge, am acting as transcriptionist for Dennard Nip, MD.  I have reviewed the above documentation for accuracy and completeness, and I agree with the above. -  Dennard Nip, MD

## 2021-04-25 ENCOUNTER — Other Ambulatory Visit (INDEPENDENT_AMBULATORY_CARE_PROVIDER_SITE_OTHER): Payer: Self-pay | Admitting: Family Medicine

## 2021-04-25 DIAGNOSIS — F3289 Other specified depressive episodes: Secondary | ICD-10-CM

## 2021-04-25 NOTE — Telephone Encounter (Signed)
Pt last seen by Dr. Beasley.  

## 2021-04-28 DIAGNOSIS — F4320 Adjustment disorder, unspecified: Secondary | ICD-10-CM | POA: Diagnosis not present

## 2021-05-12 ENCOUNTER — Telehealth (INDEPENDENT_AMBULATORY_CARE_PROVIDER_SITE_OTHER): Payer: BC Managed Care – PPO | Admitting: Family Medicine

## 2021-05-12 ENCOUNTER — Other Ambulatory Visit: Payer: Self-pay

## 2021-05-12 ENCOUNTER — Encounter (INDEPENDENT_AMBULATORY_CARE_PROVIDER_SITE_OTHER): Payer: Self-pay | Admitting: Family Medicine

## 2021-05-12 DIAGNOSIS — Z6841 Body Mass Index (BMI) 40.0 and over, adult: Secondary | ICD-10-CM

## 2021-05-12 DIAGNOSIS — E559 Vitamin D deficiency, unspecified: Secondary | ICD-10-CM

## 2021-05-12 DIAGNOSIS — F3289 Other specified depressive episodes: Secondary | ICD-10-CM | POA: Diagnosis not present

## 2021-05-12 DIAGNOSIS — I10 Essential (primary) hypertension: Secondary | ICD-10-CM | POA: Diagnosis not present

## 2021-05-12 MED ORDER — VITAMIN D (ERGOCALCIFEROL) 1.25 MG (50000 UNIT) PO CAPS: 50000.0000 [IU] | ORAL_CAPSULE | ORAL | 0 refills | Status: DC

## 2021-05-12 MED ORDER — METOPROLOL SUCCINATE ER 100 MG PO TB24: 100.0000 mg | ORAL_TABLET | Freq: Every day | ORAL | 0 refills | Status: DC

## 2021-05-12 MED ORDER — BUPROPION HCL ER (XL) 300 MG PO TB24: 300.0000 mg | ORAL_TABLET | Freq: Every morning | ORAL | 0 refills | Status: DC

## 2021-05-13 MED ORDER — VITAMIN D (ERGOCALCIFEROL) 1.25 MG (50000 UNIT) PO CAPS: 50000.0000 [IU] | ORAL_CAPSULE | ORAL | 0 refills | Status: DC

## 2021-05-15 NOTE — Progress Notes (Signed)
TeleHealth Visit:  Due to the COVID-19 pandemic, this visit was completed with telemedicine (audio/video) technology to reduce patient and provider exposure as well as to preserve personal protective equipment.   Norina has verbally consented to this TeleHealth visit. The patient is located at home, the provider is located at the [facility] office. The participants in this visit include the listed provider and patient. The visit was conducted today via MyChart Video.   Chief Complaint: OBESITY Rosaisela is here to discuss her progress with her obesity treatment plan along with follow-up of her obesity related diagnoses. Kinisha is on keeping a food journal and adhering to recommended goals of 1500-1800 calories and 100+ grams of protein daily and states she is following her eating plan approximately 30% of the time. Avamarie states she is walked 3.3 miles, and walking the dog for 15 minutes 7 times per week.  Today's visit was #: 24 Starting weight: 447 lbs Starting date: 10/11/2017  Interim History: Kimm is on Saxenda at 1.8 mg and she notes feeling some increased nausea, especially when she went too long without eating. She is very busy with work, wedding next week, and then changing jobs. She is sometimes skipping meals due to this.  Subjective:   1. Essential hypertension Kaytelyn's blood pressure has been well controlled overall. She continues to work on diet and exercise.  2. Vitamin D deficiency Kymberli is on Vit D, and no muscle weakness was noted. She has no recent labs.  3. Other depression with emotional eating Lynel has a lot of stress in her life right now. She is dealing with this reasonably well and she is stable on her medications.  Assessment/Plan:   1. Essential hypertension Kynsleigh will continue working on healthy weight loss and exercise to improve blood pressure control. We will refill Toprol XL for 1 month, and she will watch for  signs of hypotension as she continues her lifestyle modifications.  - metoprolol succinate (TOPROL-XL) 100 MG 24 hr tablet; Take 1 tablet (100 mg total) by mouth daily. Take with or immediately following a meal.  Dispense: 30 tablet; Refill: 0  2. Vitamin D deficiency Low Vitamin D level contributes to fatigue and are associated with obesity, breast, and colon cancer. We will refill prescription Vitamin D for 1 month. We will recheck labs at her next visit in 3 week in the office, and Sharya will follow-up for routine testing of Vitamin D, at least 2-3 times per year to avoid over-replacement.  - Vitamin D, Ergocalciferol, (DRISDOL) 1.25 MG (50000 UNIT) CAPS capsule; Take 1 capsule (50,000 Units total) by mouth every 7 (seven) days for 28 days.  Dispense: 4 capsule; Refill: 0  3. Other depression with emotional eating Behavior modification techniques were discussed today to help Milanni deal with her emotional/non-hunger eating behaviors. We will refill Wellbutrin XL for 1 month. Orders and follow up as documented in patient record.   - buPROPion (WELLBUTRIN XL) 300 MG 24 hr tablet; Take 1 tablet (300 mg total) by mouth in the morning.  Dispense: 30 tablet; Refill: 0  4. Obesity with current BMI 73.22 Nakiah is currently in the action stage of change. As such, her goal is to continue with weight loss efforts. She has agreed to keeping a food journal and adhering to recommended goals of 1500-1800 calories and 100+ grams of protein daily.   We discussed various medication options to help Vear with her weight loss efforts and we both agreed to continue  Saxenda at 1.8 mg, but she must no skip meals.  Exercise goals: As is.  Behavioral modification strategies: increasing lean protein intake and celebration eating strategies.  Joshalynn has agreed to follow-up with our clinic in 3 weeks. She was informed of the importance of frequent follow-up visits to maximize her success with intensive  lifestyle modifications for her multiple health conditions.  Objective:   VITALS: Per patient if applicable, see vitals. GENERAL: Alert and in no acute distress. CARDIOPULMONARY: No increased WOB. Speaking in clear sentences.  PSYCH: Pleasant and cooperative. Speech normal rate and rhythm. Affect is appropriate. Insight and judgement are appropriate. Attention is focused, linear, and appropriate.  NEURO: Oriented as arrived to appointment on time with no prompting.   Lab Results  Component Value Date   CREATININE 0.90 07/18/2020   BUN 13 07/18/2020   NA 137 07/18/2020   K 3.9 07/18/2020   CL 104 07/18/2020   CO2 24 07/18/2020   Lab Results  Component Value Date   ALT 38 07/18/2020   AST 26 07/18/2020   ALKPHOS 48 07/18/2020   BILITOT 0.7 07/18/2020   Lab Results  Component Value Date   HGBA1C 5.6 12/03/2019   HGBA1C 5.8 (H) 06/17/2019   HGBA1C 5.8 (H) 05/02/2018   HGBA1C 5.7 (H) 01/17/2018   HGBA1C 6.0 (H) 10/11/2017   Lab Results  Component Value Date   INSULIN 30.8 (H) 12/03/2019   INSULIN 35.9 (H) 06/17/2019   INSULIN 30.3 (H) 05/02/2018   INSULIN 39.5 (H) 01/17/2018   INSULIN 73.6 (H) 10/11/2017   Lab Results  Component Value Date   TSH 1.90 02/26/2020   Lab Results  Component Value Date   CHOL 165 02/26/2020   HDL 57.50 02/26/2020   LDLCALC 87 02/26/2020   TRIG 104.0 02/26/2020   CHOLHDL 3 02/26/2020   Lab Results  Component Value Date   VD25OH 34.27 02/26/2020   VD25OH 39.8 12/03/2019   VD25OH 28.1 (L) 06/17/2019   Lab Results  Component Value Date   WBC 5.6 07/18/2020   HGB 13.8 07/18/2020   HCT 42.5 07/18/2020   MCV 86.4 07/18/2020   PLT 234 07/18/2020   No results found for: IRON, TIBC, FERRITIN  Attestation Statements:   Reviewed by clinician on day of visit: allergies, medications, problem list, medical history, surgical history, family history, social history, and previous encounter notes.   I, Trixie Dredge, am acting as  transcriptionist for Dennard Nip, MD.  I have reviewed the above documentation for accuracy and completeness, and I agree with the above. - Dennard Nip, MD

## 2021-05-25 ENCOUNTER — Other Ambulatory Visit: Payer: Self-pay

## 2021-05-25 ENCOUNTER — Emergency Department (HOSPITAL_BASED_OUTPATIENT_CLINIC_OR_DEPARTMENT_OTHER): Payer: BC Managed Care – PPO

## 2021-05-25 ENCOUNTER — Emergency Department (HOSPITAL_BASED_OUTPATIENT_CLINIC_OR_DEPARTMENT_OTHER)
Admission: EM | Admit: 2021-05-25 | Discharge: 2021-05-25 | Disposition: A | Discharge status: Home / Self Care | Payer: BC Managed Care – PPO | Attending: Emergency Medicine | Admitting: Emergency Medicine

## 2021-05-25 ENCOUNTER — Encounter (HOSPITAL_BASED_OUTPATIENT_CLINIC_OR_DEPARTMENT_OTHER): Payer: Self-pay | Admitting: Emergency Medicine

## 2021-05-25 DIAGNOSIS — K219 Gastro-esophageal reflux disease without esophagitis: Secondary | ICD-10-CM | POA: Insufficient documentation

## 2021-05-25 DIAGNOSIS — J45909 Unspecified asthma, uncomplicated: Secondary | ICD-10-CM | POA: Insufficient documentation

## 2021-05-25 DIAGNOSIS — R319 Hematuria, unspecified: Secondary | ICD-10-CM | POA: Diagnosis not present

## 2021-05-25 DIAGNOSIS — R3915 Urgency of urination: Secondary | ICD-10-CM | POA: Diagnosis not present

## 2021-05-25 DIAGNOSIS — Z7951 Long term (current) use of inhaled steroids: Secondary | ICD-10-CM | POA: Diagnosis not present

## 2021-05-25 DIAGNOSIS — R109 Unspecified abdominal pain: Secondary | ICD-10-CM | POA: Diagnosis not present

## 2021-05-25 DIAGNOSIS — I10 Essential (primary) hypertension: Secondary | ICD-10-CM | POA: Diagnosis not present

## 2021-05-25 DIAGNOSIS — R1031 Right lower quadrant pain: Secondary | ICD-10-CM | POA: Insufficient documentation

## 2021-05-25 DIAGNOSIS — K760 Fatty (change of) liver, not elsewhere classified: Secondary | ICD-10-CM | POA: Diagnosis not present

## 2021-05-25 DIAGNOSIS — Z79899 Other long term (current) drug therapy: Secondary | ICD-10-CM | POA: Diagnosis not present

## 2021-05-25 LAB — CBC WITH DIFFERENTIAL/PLATELET
Abs Immature Granulocytes: 0.01 10*3/uL (ref 0.00–0.07)
Basophils Absolute: 0 10*3/uL (ref 0.0–0.1)
Basophils Relative: 0 %
Eosinophils Absolute: 0.1 10*3/uL (ref 0.0–0.5)
Eosinophils Relative: 2 %
HCT: 40.5 % (ref 36.0–46.0)
Hemoglobin: 13.4 g/dL (ref 12.0–15.0)
Immature Granulocytes: 0 %
Lymphocytes Relative: 49 %
Lymphs Abs: 2.9 10*3/uL (ref 0.7–4.0)
MCH: 28 pg (ref 26.0–34.0)
MCHC: 33.1 g/dL (ref 30.0–36.0)
MCV: 84.6 fL (ref 80.0–100.0)
Monocytes Absolute: 0.3 10*3/uL (ref 0.1–1.0)
Monocytes Relative: 5 %
Neutro Abs: 2.7 10*3/uL (ref 1.7–7.7)
Neutrophils Relative %: 44 %
Platelets: 272 10*3/uL (ref 150–400)
RBC: 4.79 MIL/uL (ref 3.87–5.11)
RDW: 13.2 % (ref 11.5–15.5)
WBC: 6 10*3/uL (ref 4.0–10.5)
nRBC: 0 % (ref 0.0–0.2)

## 2021-05-25 LAB — URINALYSIS, ROUTINE W REFLEX MICROSCOPIC
Bilirubin Urine: NEGATIVE
Glucose, UA: NEGATIVE mg/dL
Ketones, ur: NEGATIVE mg/dL
Leukocytes,Ua: NEGATIVE
Nitrite: NEGATIVE
RBC / HPF: >50 RBC/hpf — ABNORMAL HIGH (ref 0–5)
Specific Gravity, Urine: 1.022 (ref 1.005–1.030)
pH: 6 (ref 5.0–8.0)

## 2021-05-25 LAB — PREGNANCY, URINE: Preg Test, Ur: NEGATIVE

## 2021-05-25 LAB — BASIC METABOLIC PANEL
Anion gap: 11 (ref 5–15)
BUN: 12 mg/dL (ref 6–20)
CO2: 24 mmol/L (ref 22–32)
Calcium: 9 mg/dL (ref 8.9–10.3)
Chloride: 103 mmol/L (ref 98–111)
Creatinine, Ser: 0.72 mg/dL (ref 0.44–1.00)
GFR, Estimated: >60 mL/min (ref >60)
Glucose, Bld: 92 mg/dL (ref 70–99)
Potassium: 4 mmol/L (ref 3.5–5.1)
Sodium: 138 mmol/L (ref 135–145)

## 2021-05-25 MED ORDER — NITROFURANTOIN MONOHYD MACRO 100 MG PO CAPS: 100.0000 mg | ORAL_CAPSULE | Freq: Two times a day (BID) | ORAL | 0 refills | Status: DC

## 2021-05-25 NOTE — Discharge Instructions (Addendum)
You were seen and evaluated in the emergency department relation of right-sided flank pain and blood in your urine.  As we discussed, the CT was negative for any infection in your kidney and or kidney stones. I have sent a script for Macrobid for possible UTI in the event that you are not getting better.   Please return to the emergency department if you are experiencing worsening pain, increased frequency and blood in your urine, new fevers, or any other concerns you may have.  I have given you follow-up with [facility].  Please follow-up with them within the next week for further evaluation.

## 2021-05-25 NOTE — ED Notes (Signed)
Pt aware we will have Zenia Resides RN to insert an [place] IV

## 2021-05-25 NOTE — ED Provider Notes (Signed)
[facility]ENCY DEPT Provider Note   CSN: [id number] Arrival date & time: 05/25/21  1647     History Chief Complaint  Patient presents with   Flank Pain    Brandy Saunders is a 30 y.o. female who presents emergency department for 2-day history of right-sided flank pain with radiation to the right lower quadrant abdomen.  She states she was at work when she began feeling sharp stabbing sensation in the right flank area.  The pain is since migrated to the right lower quadrant.  She reports associated hematuria and urinary urgency but denies any urinary frequency, fever, chills, diarrhea, cough, congestion, and sore throat. She is married and not currently sexually active.   The history is provided by the patient. No language interpreter was used.  Flank Pain      Past Medical History:  Diagnosis Date   Alcohol abuse    Anxiety    Asthma    Back pain    Binge eating    Carpal tunnel syndrome, bilateral    Chest pain    Common migraine with intractable migraine 02/02/2017   Constipation    Depression    Dyspnea    Eczema    Fatty liver    GERD (gastroesophageal reflux disease)    Hypertension    Joint pain    Leg edema    Morbidly obese (HCC)    Palpitations    PCOS (polycystic ovarian syndrome)    Prediabetes    Sleep apnea    Urticaria    Vaginal Pap smear, abnormal    Vitamin D deficiency     Patient Active Problem List   Diagnosis Date Noted   Sciatica of left side 02/18/2021   Allergies 07/26/2020   Lymphedema 05/05/2020   Vitamin D deficiency 01/22/2020   Rash 12/16/2019   Nevus 12/16/2019   Depression 10/19/2019   Hyperlipidemia 09/18/2019   Spasm of thoracic back muscle 09/18/2019   Palpitations 08/30/2018   Obstructive sleep apnea 08/30/2018   Pain of left calf 12/14/2017   Other fatigue 10/11/2017   Shortness of breath on exertion 10/11/2017   Prediabetes 10/11/2017   Chronic left-sided thoracic back pain  05/23/2017   Acute left-sided thoracic back pain 05/09/2017   Migraine 05/09/2017   Essential hypertension 05/09/2017   Common migraine with intractable migraine 02/02/2017   Chronic fatigue 01/06/2016   Fatty liver 02/15/2015   Esophageal reflux 02/15/2015   Late menses 07/30/2014   Class 3 severe obesity with serious comorbidity and body mass index (BMI) greater than or equal to 70 in adult ([place]) 07/30/2014   Asthma without acute exacerbation 07/30/2014   IBS (irritable bowel syndrome) 07/30/2014   Elevated BP 07/30/2014   PCOS (polycystic ovarian syndrome) 11/25/2012   Carpal tunnel syndrome, bilateral     Past Surgical History:  Procedure Laterality Date   ADENOIDECTOMY     TONSILECTOMY, ADENOIDECTOMY, BILATERAL MYRINGOTOMY AND TUBES  1994     OB History     Gravida  0   Para  0   Term  0   Preterm  0   AB  0   Living  0      SAB  0   IAB  0   Ectopic  0   Multiple  0   Live Births  0           Family History  Problem Relation Age of Onset   Hypertension Mother    Hyperlipidemia Mother  Obesity Mother    Heart disease Mother    Stroke Father    Obesity Father    Alcoholism Father    Drug abuse Father    Sudden death Father    Diabetes Maternal Uncle    Heart disease Maternal Grandmother    Breast cancer Maternal Aunt    Colon cancer Neg Hx     Social History   Tobacco Use   Smoking status: Never   Smokeless tobacco: Never  Vaping Use   Vaping Use: Never used  Substance Use Topics   Alcohol use: Yes    Alcohol/week: 3.0 standard drinks    Types: 3 Glasses of wine per week   Drug use: No    Types: Marijuana    Comment: once every 6 months, taking CBD    Home Medications Prior to Admission medications   Medication Sig Start Date End Date Taking? Authorizing Provider  nitrofurantoin, macrocrystal-monohydrate, (MACROBID) 100 MG capsule Take 1 capsule (100 mg total) by mouth 2 (two) times daily. 05/25/21  Yes Raul Del, Haedyn Breau M,  PA-C  acetaminophen (TYLENOL) 500 MG tablet Take 500 mg by mouth every 6 (six) hours as needed.    [provider]  albuterol (VENTOLIN HFA) 108 (90 Base) MCG/ACT inhaler Inhale into the lungs. 09/30/19   [provider]  Ascorbic Acid (VITAMIN C) 1000 MG tablet Take 1,000 mg by mouth daily.    [provider]  BD PEN NEEDLE MICRO U/F 32G X 6 MM MISC USE WITH SAXENDA DAILY 03/01/21   Dennard Nip D, MD  BIOTIN 5000 PO Take 10,000 mg by mouth daily.     [provider]  buPROPion (WELLBUTRIN XL) 300 MG 24 hr tablet Take 1 tablet (300 mg total) by mouth in the morning. 05/12/21   Dennard Nip D, MD  cetirizine (ZYRTEC) 10 MG tablet Take 1 tablet (10 mg total) by mouth daily. 10/26/20   Valentina Shaggy, MD  chlorthalidone (HYGROTON) 25 MG tablet TAKE 1/2 TABLET BY MOUTH EVERY DAY 04/20/21   Dennard Nip D, MD  Cholecalciferol (VITAMIN D) 50 MCG (2000 UT) CAPS Take 1 capsule by mouth daily.    [provider]  ciclesonide (ALVESCO) 160 MCG/ACT inhaler Inhale 1 puff into the lungs 2 (two) times daily. 11/17/20   Dara Hoyer, FNP  Cyanocobalamin (VITAMIN B 12 PO) Take 2,500 mcg by mouth daily.    [provider]  medroxyPROGESTERone (PROVERA) 10 MG tablet Take 1 tablet (10 mg total) by mouth daily. Use for ten days 12/29/20   Ronnald Nian, DO  methocarbamol (ROBAXIN) 500 MG tablet Take 1 tablet (500 mg total) by mouth every 8 (eight) hours as needed for muscle spasms. 02/18/21   Libby Maw, MD  metoprolol succinate (TOPROL-XL) 100 MG 24 hr tablet Take 1 tablet (100 mg total) by mouth daily. Take with or immediately following a meal. 05/12/21   Dennard Nip D, MD  Multiple Vitamins-Calcium (ONE-A-DAY WOMENS PO) Take 1 tablet by mouth daily.     [provider]  polyethylene glycol (MIRALAX / GLYCOLAX) 17 g packet Take 17 g by mouth daily as needed (constipation).    [provider]  SAXENDA 18 MG/3ML SOPN INJECT 3  MG DOSE INTO THE SKIN DAILY 02/23/21   Dennard Nip D, MD  triamcinolone ointment (KENALOG) 0.1 % Apply thin layer to affected areas 2-3 times daily during flares. 08/10/20   Kozlow, Donnamarie Poag, MD  Vitamin D, Ergocalciferol, (DRISDOL) 1.25 MG (50000  UNIT) CAPS capsule Take 1 capsule (50,000 Units total) by mouth every 7 (seven) days for 28 days. 05/13/21 06/10/21  Dennard Nip D, MD    Allergies    Ibuprofen and Meloxicam  Review of Systems   Review of Systems  Genitourinary:  Positive for flank pain.  All other systems reviewed and are negative.  Physical Exam Updated Vital Signs BP 127/81 (BP Location: Left Wrist)   Pulse 79   Temp 98.1 F (36.7 C)   Resp 17   Ht 5\' 5"  (1.651 m)   Wt (!) 199.6 kg   LMP 05/17/2021   SpO2 100%   BMI 73.22 kg/m   Physical Exam Constitutional:      General: She is not in acute distress.    Appearance: Normal appearance.  HENT:     Head: Normocephalic and atraumatic.  Eyes:     General:        Right eye: No discharge.        Left eye: No discharge.  Cardiovascular:     Comments: Regular rate and rhythm.  S1/S2 are distinct without any evidence of murmur, rubs, or gallops.  Radial pulses are 2+ bilaterally.  Dorsalis pedis pulses are 2+ bilaterally.  No evidence of pedal edema. Pulmonary:     Comments: Clear to auscultation bilaterally.  Normal effort.  No respiratory distress.  No evidence of wheezes, rales, or rhonchi heard throughout. Abdominal:     General: Abdomen is flat. There is no distension.     Tenderness: There is no guarding or rebound.     Comments: Tenderness along the right middle and upper side of the abdomen. No RLQ abdominal tenderness.  Difficult to auscultate bowel sounds secondary to body habitus.  Right CVA tenderness. No obvious peritoneal signs.   Musculoskeletal:        General: Normal range of motion.     Cervical back: Neck supple.  Skin:    General: Skin is warm and dry.     Findings: No rash.  Neurological:      General: No focal deficit present.     Mental Status: She is alert.  Psychiatric:        Mood and Affect: Mood normal.        Behavior: Behavior normal.    ED Results / Procedures / Treatments   Labs (all labs ordered are listed, but only abnormal results are displayed) Labs Reviewed  URINALYSIS, ROUTINE W REFLEX MICROSCOPIC - Abnormal; Notable for the following components:      Result Value   Hgb urine dipstick MODERATE (*)    Protein, ur TRACE (*)    RBC / HPF >50 (*)    Bacteria, UA RARE (*)    All other components within normal limits  PREGNANCY, URINE  CBC WITH DIFFERENTIAL/PLATELET  BASIC METABOLIC PANEL    EKG None  Radiology CT Renal Stone Study  Result Date: 05/25/2021 CLINICAL DATA:  Flank pain EXAM: CT ABDOMEN AND PELVIS WITHOUT CONTRAST TECHNIQUE: Multidetector CT imaging of the abdomen and pelvis was performed following the standard protocol without IV contrast. COMPARISON:  None. FINDINGS: Lower chest: No acute abnormality. Hepatobiliary: Diffuse low attenuation of the liver as can be seen with hepatic steatosis. No focal hepatic mass. Hepatomegaly. No cholelithiasis, gallbladder wall thickening or biliary ductal dilatation. Pancreas: Unremarkable. No pancreatic ductal dilatation or surrounding inflammatory changes. Spleen: Normal in size without focal abnormality. Adrenals/Urinary Tract: Adrenal glands are unremarkable. Kidneys are normal, without renal calculi, focal lesion, or hydronephrosis.  Bladder is unremarkable. Stomach/Bowel: Stomach is within normal limits. Appendix appears normal. No evidence of bowel wall thickening, distention, or inflammatory changes. Vascular/Lymphatic: No significant vascular findings are present. No enlarged abdominal or pelvic lymph nodes. Reproductive: Uterus and bilateral adnexa are unremarkable. Other: Fat containing umbilical hernia.  No abdominopelvic ascites. Musculoskeletal: No acute osseous abnormality. No aggressive osseous  lesion. IMPRESSION: 1. No acute abdominal or pelvic pathology. 2. No urolithiasis or obstructive uropathy. 3. Hepatic steatosis. Electronically Signed   By: Kathreen Devoid M.D.   On: 05/25/2021 18:57    Procedures Procedures   Medications Ordered in ED Medications - No data to display  ED Course  I have reviewed the triage vital signs and the nursing notes.  Pertinent labs & imaging results that were available during my care of the patient were reviewed by me and considered in my medical decision making (see chart for details).    MDM Rules/Calculators/A&P                          Brandy Saunders is a 30 y.o. female who presents to the emergency department for further evaluation of right sided flank pain.  History and physical exam was initially concerning for possible nephrolithiasis.  No clinical signs of systemic infection or sepsis at this time.  CBC was normal.  CMP was normal.  UA revealed blood in her urine.  CT renal study did not reveal any nephrolithiasis or pyelonephritis at this time.  Given the clinical scenario, it is unclear what is causing flank pain and hematuria at this time. Emergent causes of your flank pain are not evident at this time. I will provide her with follow-up at [facility].  Strict return precautions given. I will provide a script of nitrofurantoin in the event that her symptoms do not resolve on her own for possible UTI.     Final Clinical Impression(s) / ED Diagnoses Final diagnoses:  Flank pain    Rx / DC Orders ED Discharge Orders          Ordered    nitrofurantoin, macrocrystal-monohydrate, (MACROBID) 100 MG capsule  2 times daily        05/25/21 2003             Cherrie Gauze 05/25/21 2004    Varney Biles, MD 05/26/21 1950

## 2021-05-25 NOTE — ED Triage Notes (Signed)
Reports right flank pain since Monday with some blood in urine.

## 2021-05-26 DIAGNOSIS — R319 Hematuria, unspecified: Secondary | ICD-10-CM | POA: Diagnosis not present

## 2021-05-26 DIAGNOSIS — R102 Pelvic and perineal pain: Secondary | ICD-10-CM | POA: Diagnosis not present

## 2021-06-02 ENCOUNTER — Ambulatory Visit (INDEPENDENT_AMBULATORY_CARE_PROVIDER_SITE_OTHER): Payer: BC Managed Care – PPO | Admitting: Family Medicine

## 2021-06-02 ENCOUNTER — Telehealth: Payer: Self-pay

## 2021-06-02 ENCOUNTER — Encounter: Payer: Self-pay | Admitting: Allergy & Immunology

## 2021-06-02 ENCOUNTER — Ambulatory Visit (INDEPENDENT_AMBULATORY_CARE_PROVIDER_SITE_OTHER): Payer: BC Managed Care – PPO | Admitting: Allergy & Immunology

## 2021-06-02 ENCOUNTER — Other Ambulatory Visit: Payer: Self-pay

## 2021-06-02 ENCOUNTER — Encounter (INDEPENDENT_AMBULATORY_CARE_PROVIDER_SITE_OTHER): Payer: Self-pay | Admitting: Family Medicine

## 2021-06-02 VITALS — BP 120/74 | HR 74 | Temp 97.9°F | Resp 18 | Ht 65.0 in | Wt >= 6400 oz

## 2021-06-02 VITALS — BP 127/83 | HR 79 | Temp 97.7°F | Ht 65.0 in | Wt >= 6400 oz

## 2021-06-02 DIAGNOSIS — E559 Vitamin D deficiency, unspecified: Secondary | ICD-10-CM | POA: Diagnosis not present

## 2021-06-02 DIAGNOSIS — Z9189 Other specified personal risk factors, not elsewhere classified: Secondary | ICD-10-CM | POA: Diagnosis not present

## 2021-06-02 DIAGNOSIS — Z6841 Body Mass Index (BMI) 40.0 and over, adult: Secondary | ICD-10-CM

## 2021-06-02 DIAGNOSIS — R7303 Prediabetes: Secondary | ICD-10-CM | POA: Diagnosis not present

## 2021-06-02 DIAGNOSIS — F4320 Adjustment disorder, unspecified: Secondary | ICD-10-CM | POA: Diagnosis not present

## 2021-06-02 DIAGNOSIS — K219 Gastro-esophageal reflux disease without esophagitis: Secondary | ICD-10-CM

## 2021-06-02 DIAGNOSIS — J302 Other seasonal allergic rhinitis: Secondary | ICD-10-CM

## 2021-06-02 DIAGNOSIS — J3089 Other allergic rhinitis: Secondary | ICD-10-CM

## 2021-06-02 DIAGNOSIS — R21 Rash and other nonspecific skin eruption: Secondary | ICD-10-CM | POA: Diagnosis not present

## 2021-06-02 DIAGNOSIS — J4530 Mild persistent asthma, uncomplicated: Secondary | ICD-10-CM

## 2021-06-02 MED ORDER — SAXENDA 18 MG/3ML ~~LOC~~ SOPN: PEN_INJECTOR | SUBCUTANEOUS | 0 refills | Status: DC

## 2021-06-02 NOTE — Patient Instructions (Addendum)
1. Mild persistent asthma, uncomplicated - Lung testing not done since you were doing so well. - I am fine with "drug holidays", but I want you to be aggressive when you start having symptoms.  - You could either starting taking the Alvesco now as a preventative OR use two puffs twice daily for 1-2 weeks when you have symptoms to avoid needing prednisone.  - Daily controller medication(s): NOTHING - Prior to physical activity: albuterol 2 puffs 10-15 minutes before physical activity. - Rescue medications: albuterol 4 puffs every 4-6 hours as needed - Asthma control goals:  * Full participation in all desired activities (may need albuterol before activity) * Albuterol use two time or less a week on average (not counting use with activity) * Cough interfering with sleep two time or less a month * Oral steroids no more than once a year * No hospitalizations  2. Seasonal and perennial allergic rhinitis (dust mites, cat, dog, grasses, and molds) - Continue with Zyrtec (cetirizine) 10mg  once daily to prevent allergy symptoms. - You could consider Allegra (fexofenadine) 180mg  to see if this causes LESS sleepiness.    3. Rash - Continue with the minocycline as needed. - We are going to get the records from Dr[facility] Wynetta [facility]y at [facility] to see what she thought this rash was.   4. Return in about 6 months (around 12/01/2021).    Please inform us of any Emergency Department visits, hospitalizations, or changes in symptoms. Call us before going to the ED for breathing or allergy symptoms since we might be able to fit you in for a sick visit. Feel free to contact us anytime with any questions, problems, or concerns.  It was a pleasure to see you again today as always!   Websites that have reliable patient information: 1. American Academy of Asthma, Allergy, and Immunology: www.aaaai.org 2. Food Allergy Research and Education (FARE): foodallergy.org 3. Mothers of Asthmatics:  http://www.asthmacommunitynetwork.org 4. American College of Allergy, Asthma, and Immunology: www.acaai.org   COVID-19 Vaccine Information can be found at: ShippingScam.[email or web address].uk For questions related to vaccine distribution or appointments, please email [email or web address] or call [phone].   We realize that you might be concerned about having an allergic reaction to the COVID19 vaccines. To help with that concern, WE ARE OFFERING THE COVID19 VACCINES IN OUR OFFICE! Ask the front desk for dates!     "Like" [place] on Facebook and Instagram for our latest updates!      A healthy democracy works best when [employer] participate! Make sure you are registered to vote! If you have moved or changed any of your contact information, you will need to get this updated before voting!  In some cases, you MAY be able to register to vote online: [email or web address]

## 2021-06-02 NOTE — Telephone Encounter (Signed)
Called patient for phone or fax number for Dermatology office. The disclosure form did not have address, phone, or fax number. Just to verify all information I left a message for the patient to call the office back so we can fax over the form.

## 2021-06-02 NOTE — Progress Notes (Signed)
FOLLOW UP  Date of Service/Encounter:  06/02/21   Assessment:   Rash - unclear etiology but improved with PRN triamcinolone   Mild persistent asthma, uncomplicated   GERD - recommended taking famotidine BID when the symptoms start (patient prefers to not taking something on a daily basis)   Complicated past medical history, including obesity   Patient presents for follow-up visit.  Overall, her atopic disease seems to be rather quiescent at this time of the year.  She is not using anything on a routine basis.  This is her preferred method of medications, and I think it can work for her.  I did tell her that she needs to be aggressive when she starts having symptoms.  I would hate for her to not be on her inhaled steroid and then need a round of prednisone.  She may even start her inhaled steroid as a prophylaxis since winter is the worst time of the year.  She is going to consider her options and call us with any updates.  Otherwise, we will see her in 6 months or earlier if needed.  Plan/Recommendations:   1. Mild persistent asthma, uncomplicated - Lung testing not done since you were doing so well. - I am fine with "drug holidays", but I want you to be aggressive when you start having symptoms.  - You could either starting taking the Alvesco now as a preventative OR use two puffs twice daily for 1-2 weeks when you have symptoms to avoid needing prednisone.  - Daily controller medication(s): NOTHING - Prior to physical activity: albuterol 2 puffs 10-15 minutes before physical activity. - Rescue medications: albuterol 4 puffs every 4-6 hours as needed - Asthma control goals:  * Full participation in all desired activities (may need albuterol before activity) * Albuterol use two time or less a week on average (not counting use with activity) * Cough interfering with sleep two time or less a month * Oral steroids no more than once a year * No hospitalizations  2. Seasonal and  perennial allergic rhinitis (dust mites, cat, dog, grasses, and molds) - Continue with Zyrtec (cetirizine) 10mg  once daily to prevent allergy symptoms. - You could consider Allegra (fexofenadine) 180mg  to see if this causes LESS sleepiness.    3. Rash - Continue with the minocycline as needed. - We are going to get the records from Dr. Wynetta Emery at [facility] to see what she thought this rash was.   4. Return in about 6 months (around 12/01/2021).    Subjective:   Brandy Saunders is a 30 y.o. female presenting today for follow up of  Chief Complaint  Patient presents with   Follow-up    Patient in today for follow up and states she has not had to use any of her inhalers for the past few months. She has not had any problems.    Brandy Saunders has a history of the following: Patient Active Problem List   Diagnosis Date Noted   Sciatica of left side 02/18/2021   Allergies 07/26/2020   Lymphedema 05/05/2020   Vitamin D deficiency 01/22/2020   Rash 12/16/2019   Nevus 12/16/2019   Depression 10/19/2019   Hyperlipidemia 09/18/2019   Spasm of thoracic back muscle 09/18/2019   Palpitations 08/30/2018   Obstructive sleep apnea 08/30/2018   Pain of left calf 12/14/2017   Other fatigue 10/11/2017   Shortness of breath on exertion 10/11/2017   Prediabetes 10/11/2017   Chronic left-sided thoracic back  pain 05/23/2017   Acute left-sided thoracic back pain 05/09/2017   Migraine 05/09/2017   Essential hypertension 05/09/2017   Common migraine with intractable migraine 02/02/2017   Chronic fatigue 01/06/2016   Fatty liver 02/15/2015   Esophageal reflux 02/15/2015   Late menses 07/30/2014   Class 3 severe obesity with serious comorbidity and body mass index (BMI) greater than or equal to 70 in adult ([place]) 07/30/2014   Asthma without acute exacerbation 07/30/2014   IBS (irritable bowel syndrome) 07/30/2014   Elevated BP 07/30/2014   PCOS (polycystic ovarian syndrome)  11/25/2012   Carpal tunnel syndrome, bilateral     History obtained from: chart review and patient.  Brandy Saunders is a 30 y.o. female presenting for a follow up visit.  She was last seen in April 2022.  At that time, her lung testing looks stable.  We continued with Alvesco 180 mcg 1 puff twice daily as well as albuterol as needed.  For her rhinitis, we started Zyrtec 10 mg once daily.  Her rash was under good control with triamcinolone as needed.  Since last visit, she has done very well. She had a small wedding ten days ago. They went to a wine bar and took care of it with her family.   Asthma/Respiratory Symptom History: She has been off of her inhalers for a period of time during the summer.  Fall is typically a worse time of the year for her as well as the winter. She denies any nighttime breathing issues. She has carpal tunnel that keeps her up at night occasionally.   Allergic Rhinitis Symptom History: She continues with the cetirizine as needed. It was causing a lot of sleepiness, so she is not taking this routinely. She has not had too terrible of an issue. She has had Brandy Saunders for two years.   Skin Symptom History: She went to see a Dermatologist (Dr. Loni Beckwith). She has not had a return of the rash. It has been 3-4 months. She was prescribed minocycline to use when it pops up. But it has not popped up since she saw the Dermatologist.   Otherwise, there have been no changes to her past medical history, surgical history, family history, or social history.    Review of Systems  Constitutional: Negative.  Negative for chills, fever, malaise/fatigue and weight loss.  HENT: Negative.  Negative for congestion, ear discharge and ear pain.   Eyes:  Negative for pain, discharge and redness.  Respiratory:  Negative for cough, sputum production, shortness of breath and wheezing.   Cardiovascular: Negative.  Negative for chest pain and palpitations.  Gastrointestinal:  Negative for abdominal  pain, constipation, diarrhea, heartburn, nausea and vomiting.  Skin: Negative.  Negative for itching and rash.  Neurological:  Negative for dizziness and headaches.  Endo/Heme/Allergies:  Negative for environmental allergies. Does not bruise/bleed easily.      Objective:   Blood pressure 120/74, pulse 74, temperature 97.9 F (36.6 C), temperature source Temporal, resp. rate 18, height 5\' 5"  (1.651 m), weight (!) 450 lb 6.4 oz (204.3 kg), last menstrual period 05/17/2021, SpO2 97 %. Body mass index is 74.95 kg/m.   Physical Exam:  Physical Exam Vitals reviewed.  Constitutional:      Appearance: She is well-developed. She is obese.  HENT:     Head: Normocephalic and atraumatic.     Right Ear: Tympanic membrane, ear canal and external ear normal.     Left Ear: Tympanic membrane, ear canal and external ear normal.  Nose: No nasal deformity, septal deviation, mucosal edema or rhinorrhea.     Right Turbinates: Not enlarged or swollen.     Left Turbinates: Not enlarged or swollen.     Right Sinus: No maxillary sinus tenderness or frontal sinus tenderness.     Left Sinus: No maxillary sinus tenderness or frontal sinus tenderness.     Comments: Nose ring in place.    Mouth/Throat:     Mouth: Mucous membranes are not pale and not dry.     Pharynx: Uvula midline.  Eyes:     General: Lids are normal. Allergic shiner present.        Right eye: No discharge.        Left eye: No discharge.     Conjunctiva/sclera: Conjunctivae normal.     Right eye: Right conjunctiva is not injected. No chemosis.    Left eye: Left conjunctiva is not injected. No chemosis.    Pupils: Pupils are equal, round, and reactive to light.  Cardiovascular:     Rate and Rhythm: Normal rate and regular rhythm.     Heart sounds: Normal heart sounds.  Pulmonary:     Effort: Pulmonary effort is normal. No tachypnea, accessory muscle usage or respiratory distress.     Breath sounds: Normal breath sounds. No  wheezing, rhonchi or rales.     Comments: Moving air well in all lung fields, although difficult to hear at the bases. Chest:     Chest wall: No tenderness.  Lymphadenopathy:     Cervical: No cervical adenopathy.  Skin:    Coloration: Skin is not pale.     Findings: No abrasion, erythema, petechiae or rash. Rash is not papular, urticarial or vesicular.  Neurological:     Mental Status: She is alert.  Psychiatric:        Behavior: Behavior is cooperative.     Diagnostic studies:  none     Salvatore Marvel, MD  [facility]

## 2021-06-03 DIAGNOSIS — R102 Pelvic and perineal pain: Secondary | ICD-10-CM | POA: Diagnosis not present

## 2021-06-06 NOTE — Progress Notes (Signed)
Chief Complaint:   OBESITY Brandy Saunders is here to discuss her progress with her obesity treatment plan along with follow-up of her obesity related diagnoses. Brandy Saunders is on the Category 3 Plan or keeping a food journal and adhering to recommended goals of 1500-1800 calories and 100+ grams of protein daily and states she is following her eating plan approximately 30% of the time. Brandy Saunders states she is walking for 15 minutes 2 times per week.  Today's visit was #: 92 Starting weight: 447 lbs Starting date: 10/11/2017 Today's weight: 447 lbs Today's date: 06/02/2021 Total lbs lost to date: 0 Total lbs lost since last in-office visit: 0  Interim History: Brandy Saunders has been busy with her wedding and some issues with her health. She is getting ready to get back on track. She has sone well with Saxenda overall.  Subjective:   1. Pre-diabetes Brandy Saunders is working on diet and exercise. She is on GLP-1, whish should help as well.  2. Vitamin D deficiency Brandy Saunders is on OTC Vit D and Vit D prescription, no refill needed today.  3. At risk for heart disease Brandy Saunders is at a higher than average risk for cardiovascular disease due to obesity.   Assessment/Plan:   1. Pre-diabetes Brandy Saunders will continue with diet, exercise, weight loss, and decreasing simple carbohydrates to help decrease the risk of diabetes. We will recheck labs in 2 months.  2. Vitamin D deficiency Low Vitamin D level contributes to fatigue and are associated with obesity, breast, and colon cancer. Brandy Saunders will continue OTC Vitamin D and prescription Vitamin D. We will recheck labs in 2 months. She will follow-up for routine testing of Vitamin D, at least 2-3 times per year to avoid over-replacement.  3. At risk for heart disease Brandy Saunders was given approximately 15 minutes of coronary artery disease prevention counseling today. She is 30 y.o. female and has risk factors for heart disease including obesity. We discussed  intensive lifestyle modifications today with an emphasis on specific weight loss instructions and strategies.   Repetitive spaced learning was employed today to elicit superior memory formation and behavioral change.  4. Obesity with current BMI 74.4 Brandy Saunders is currently in the action stage of change. As such, her goal is to continue with weight loss efforts. She has agreed to the Category 3 Plan or keeping a food journal and adhering to recommended goals of 1500-1800 calories and 100+ grams of protein daily.   We discussed various medication options to help Brandy Saunders with her weight loss efforts and we both agreed to continue Saxenda 3 mg and we will refill for 1 month, and we will refill nano needles #100 with no refills.  - Liraglutide -Weight Management (SAXENDA) 18 MG/3ML SOPN; INJECT 3 MG DOSE INTO THE SKIN DAILY  Dispense: 15 mL; Refill: 0  Exercise goals: As is.  Behavioral modification strategies: increasing lean protein intake and meal planning and cooking strategies.  Brandy Saunders has agreed to follow-up with our clinic in 4 weeks. She was informed of the importance of frequent follow-up visits to maximize her success with intensive lifestyle modifications for her multiple health conditions.   Objective:   Blood pressure 127/83, pulse 79, temperature 97.7 F (36.5 C), height 5\' 5"  (1.651 m), weight (!) 447 lb (202.8 kg), last menstrual period 05/17/2021, SpO2 100 %. Body mass index is 74.38 kg/m.  General: Cooperative, alert, well developed, in no acute distress. HEENT: Conjunctivae and lids unremarkable. Cardiovascular: Regular rhythm.  Lungs: Normal work of breathing. Neurologic: No  focal deficits.   Lab Results  Component Value Date   CREATININE 0.72 05/25/2021   BUN 12 05/25/2021   NA 138 05/25/2021   K 4.0 05/25/2021   CL 103 05/25/2021   CO2 24 05/25/2021   Lab Results  Component Value Date   ALT 38 07/18/2020   AST 26 07/18/2020   ALKPHOS 48 07/18/2020    BILITOT 0.7 07/18/2020   Lab Results  Component Value Date   HGBA1C 5.6 12/03/2019   HGBA1C 5.8 (H) 06/17/2019   HGBA1C 5.8 (H) 05/02/2018   HGBA1C 5.7 (H) 01/17/2018   HGBA1C 6.0 (H) 10/11/2017   Lab Results  Component Value Date   INSULIN 30.8 (H) 12/03/2019   INSULIN 35.9 (H) 06/17/2019   INSULIN 30.3 (H) 05/02/2018   INSULIN 39.5 (H) 01/17/2018   INSULIN 73.6 (H) 10/11/2017   Lab Results  Component Value Date   TSH 1.90 02/26/2020   Lab Results  Component Value Date   CHOL 165 02/26/2020   HDL 57.50 02/26/2020   LDLCALC 87 02/26/2020   TRIG 104.0 02/26/2020   CHOLHDL 3 02/26/2020   Lab Results  Component Value Date   VD25OH 34.27 02/26/2020   VD25OH 39.8 12/03/2019   VD25OH 28.1 (L) 06/17/2019   Lab Results  Component Value Date   WBC 6.0 05/25/2021   HGB 13.4 05/25/2021   HCT 40.5 05/25/2021   MCV 84.6 05/25/2021   PLT 272 05/25/2021   No results found for: IRON, TIBC, FERRITIN  Attestation Statements:   Reviewed by clinician on day of visit: allergies, medications, problem list, medical history, surgical history, family history, social history, and previous encounter notes.   I, Trixie Dredge, am acting as transcriptionist for Dennard Nip, MD.  I have reviewed the above documentation for accuracy and completeness, and I agree with the above. -  Dennard Nip, MD

## 2021-06-15 MED ORDER — BD PEN NEEDLE MICRO U/F 32G X 6 MM MISC: 0 refills | Status: DC

## 2021-06-17 DIAGNOSIS — R739 Hyperglycemia, unspecified: Secondary | ICD-10-CM | POA: Diagnosis not present

## 2021-06-17 DIAGNOSIS — Z113 Encounter for screening for infections with a predominantly sexual mode of transmission: Secondary | ICD-10-CM | POA: Diagnosis not present

## 2021-06-17 DIAGNOSIS — E282 Polycystic ovarian syndrome: Secondary | ICD-10-CM | POA: Diagnosis not present

## 2021-06-17 DIAGNOSIS — Z0000 Encounter for general adult medical examination without abnormal findings: Secondary | ICD-10-CM | POA: Diagnosis not present

## 2021-06-17 DIAGNOSIS — Z6841 Body Mass Index (BMI) 40.0 and over, adult: Secondary | ICD-10-CM | POA: Diagnosis not present

## 2021-06-29 ENCOUNTER — Encounter (INDEPENDENT_AMBULATORY_CARE_PROVIDER_SITE_OTHER): Payer: Self-pay

## 2021-06-30 ENCOUNTER — Ambulatory Visit (INDEPENDENT_AMBULATORY_CARE_PROVIDER_SITE_OTHER): Payer: BC Managed Care – PPO | Admitting: Family Medicine

## 2021-06-30 ENCOUNTER — Encounter (INDEPENDENT_AMBULATORY_CARE_PROVIDER_SITE_OTHER): Payer: Self-pay | Admitting: Family Medicine

## 2021-06-30 ENCOUNTER — Other Ambulatory Visit: Payer: Self-pay

## 2021-06-30 VITALS — BP 109/78 | HR 77 | Temp 98.0°F | Ht 65.0 in | Wt >= 6400 oz

## 2021-06-30 DIAGNOSIS — L83 Acanthosis nigricans: Secondary | ICD-10-CM | POA: Diagnosis not present

## 2021-06-30 DIAGNOSIS — Z9189 Other specified personal risk factors, not elsewhere classified: Secondary | ICD-10-CM

## 2021-06-30 DIAGNOSIS — D2372 Other benign neoplasm of skin of left lower limb, including hip: Secondary | ICD-10-CM | POA: Diagnosis not present

## 2021-06-30 DIAGNOSIS — F4320 Adjustment disorder, unspecified: Secondary | ICD-10-CM | POA: Diagnosis not present

## 2021-06-30 DIAGNOSIS — D225 Melanocytic nevi of trunk: Secondary | ICD-10-CM | POA: Diagnosis not present

## 2021-06-30 DIAGNOSIS — L918 Other hypertrophic disorders of the skin: Secondary | ICD-10-CM | POA: Diagnosis not present

## 2021-06-30 DIAGNOSIS — F3289 Other specified depressive episodes: Secondary | ICD-10-CM | POA: Diagnosis not present

## 2021-06-30 DIAGNOSIS — Z6841 Body Mass Index (BMI) 40.0 and over, adult: Secondary | ICD-10-CM

## 2021-06-30 DIAGNOSIS — I10 Essential (primary) hypertension: Secondary | ICD-10-CM | POA: Diagnosis not present

## 2021-06-30 MED ORDER — METOPROLOL SUCCINATE ER 100 MG PO TB24: 100.0000 mg | ORAL_TABLET | Freq: Every day | ORAL | 0 refills | Status: DC

## 2021-06-30 MED ORDER — BUPROPION HCL ER (XL) 300 MG PO TB24: 300.0000 mg | ORAL_TABLET | Freq: Every morning | ORAL | 0 refills | Status: DC

## 2021-06-30 NOTE — Progress Notes (Signed)
Chief Complaint:   OBESITY [name] is here to discuss her progress with her obesity treatment plan along with follow-up of her obesity related diagnoses. Brandy Saunders is on the Category 3 Plan and keeping a food journal and adhering to recommended goals of 1500-1800 calories and 100+ grams of protein daily and states she is following her eating plan approximately 50% of the time. Brandy Saunders states she is walking the dog, and at the gym for 30 minutes 5 times per week.  Today's visit was #: 47 Starting weight: 447 lbs Starting date: 10/11/2017 Today's weight: 446 lbs Today's date: 06/30/2021 Total lbs lost to date: 1 Total lbs lost since last in-office visit: 1  Interim History: Brandy Saunders did some celebration eating a couple of weeks ago, and she has been working on Museum/gallery conservator. She has worked on increasing vegetables, but she may not be meeting her protein goals. She has started exercising.  Subjective:   1. Essential hypertension Brandy Saunders's blood pressure is stable on her medications. She is working on diet, exercise, and weight loss. She denies signs of lightheadedness.  2. Other depression with emotional eating Brandy Saunders's mood is stable on her medications. Her blood pressure is controlled, and no tremors noted.  3. At risk for impaired metabolic function Brandy Saunders is at increased risk for impaired metabolic function if protein decreases.  Assessment/Plan:   1. Essential hypertension Brandy Saunders will working on diet and exercise. We will refill Toprol for 1 month, and will watch for signs of hypotension as she continues her lifestyle modifications.  - metoprolol succinate (TOPROL-XL) 100 MG 24 hr tablet; Take 1 tablet (100 mg total) by mouth daily. Take with or immediately following a meal.  Dispense: 30 tablet; Refill: 0  2. Other depression with emotional eating Behavior modification techniques were discussed today to help Brandy Saunders deal with her emotional/non-hunger  eating behaviors. We will refill Wellbutrin XL for 1 month. Orders and follow up as documented in patient record.   - buPROPion (WELLBUTRIN XL) 300 MG 24 hr tablet; Take 1 tablet (300 mg total) by mouth in the morning.  Dispense: 30 tablet; Refill: 0  3. At risk for impaired metabolic function Brandy Saunders was given approximately 15 minutes of impaired  metabolic function prevention counseling today. We discussed intensive lifestyle modifications today with an emphasis on specific nutrition and exercise instructions and strategies.   Repetitive spaced learning was employed today to elicit superior memory formation and behavioral change.  4. Obesity with current BMI of 74.4 Brandy Saunders is currently in the action stage of change. As such, her goal is to continue with weight loss efforts. She has agreed to the Category 3 Plan.   Exercise goals: Concentrate on strengthening at the gym.  Behavioral modification strategies: meal planning and cooking strategies.  Brandy Saunders has agreed to follow-up with our clinic in 4 weeks. She was informed of the importance of frequent follow-up visits to maximize her success with intensive lifestyle modifications for her multiple health conditions.   Objective:   Blood pressure 109/78, pulse 77, temperature 98 F (36.7 C), height 5\' 5"  (1.651 m), weight (!) 446 lb (202.3 kg), SpO2 99 %. Body mass index is 74.22 kg/m.  General: Cooperative, alert, well developed, in no acute distress. HEENT: Conjunctivae and lids unremarkable. Cardiovascular: Regular rhythm.  Lungs: Normal work of breathing. Neurologic: No focal deficits.   Lab Results  Component Value Date   CREATININE 0.72 05/25/2021   BUN 12 05/25/2021   NA 138 05/25/2021  K 4.0 05/25/2021   CL 103 05/25/2021   CO2 24 05/25/2021   Lab Results  Component Value Date   ALT 38 07/18/2020   AST 26 07/18/2020   ALKPHOS 48 07/18/2020   BILITOT 0.7 07/18/2020   Lab Results  Component Value Date    HGBA1C 5.6 12/03/2019   HGBA1C 5.8 (H) 06/17/2019   HGBA1C 5.8 (H) 05/02/2018   HGBA1C 5.7 (H) 01/17/2018   HGBA1C 6.0 (H) 10/11/2017   Lab Results  Component Value Date   INSULIN 30.8 (H) 12/03/2019   INSULIN 35.9 (H) 06/17/2019   INSULIN 30.3 (H) 05/02/2018   INSULIN 39.5 (H) 01/17/2018   INSULIN 73.6 (H) 10/11/2017   Lab Results  Component Value Date   TSH 1.90 02/26/2020   Lab Results  Component Value Date   CHOL 165 02/26/2020   HDL 57.50 02/26/2020   LDLCALC 87 02/26/2020   TRIG 104.0 02/26/2020   CHOLHDL 3 02/26/2020   Lab Results  Component Value Date   VD25OH 34.27 02/26/2020   VD25OH 39.8 12/03/2019   VD25OH 28.1 (L) 06/17/2019   Lab Results  Component Value Date   WBC 6.0 05/25/2021   HGB 13.4 05/25/2021   HCT 40.5 05/25/2021   MCV 84.6 05/25/2021   PLT 272 05/25/2021   No results found for: IRON, TIBC, FERRITIN  Attestation Statements:   Reviewed by clinician on day of visit: allergies, medications, problem list, medical history, surgical history, family history, social history, and previous encounter notes.   I, Trixie Dredge, am acting as transcriptionist for Dennard Nip, MD.  I have reviewed the above documentation for accuracy and completeness, and I agree with the above. -  Dennard Nip, MD

## 2021-07-11 ENCOUNTER — Other Ambulatory Visit: Payer: Self-pay

## 2021-07-11 ENCOUNTER — Encounter: Payer: Self-pay | Admitting: Obstetrics & Gynecology

## 2021-07-11 ENCOUNTER — Other Ambulatory Visit (HOSPITAL_COMMUNITY)
Admission: RE | Admit: 2021-07-11 | Discharge: 2021-07-11 | Disposition: A | Discharge status: Home / Self Care | Payer: BC Managed Care – PPO | Source: Ambulatory Visit | Attending: Obstetrics & Gynecology | Admitting: Obstetrics & Gynecology

## 2021-07-11 ENCOUNTER — Ambulatory Visit (INDEPENDENT_AMBULATORY_CARE_PROVIDER_SITE_OTHER): Payer: BC Managed Care – PPO | Admitting: Obstetrics & Gynecology

## 2021-07-11 VITALS — BP 122/87 | HR 82 | Wt >= 6400 oz

## 2021-07-11 DIAGNOSIS — E282 Polycystic ovarian syndrome: Secondary | ICD-10-CM

## 2021-07-11 DIAGNOSIS — Z113 Encounter for screening for infections with a predominantly sexual mode of transmission: Secondary | ICD-10-CM

## 2021-07-11 DIAGNOSIS — N939 Abnormal uterine and vaginal bleeding, unspecified: Secondary | ICD-10-CM | POA: Diagnosis not present

## 2021-07-11 MED ORDER — NORGESTREL-ETHINYL ESTRADIOL 0.3-30 MG-MCG PO TABS: 1.0000 | ORAL_TABLET | Freq: Every day | ORAL | 4 refills | Status: DC

## 2021-07-11 NOTE — Progress Notes (Signed)
History:  30 y.o. G0P0000 here today for f/u of AUB. Pt got married since her last visit!  Unfortunately has been bleeding for the past 2 months. She was seen in the ED and has been her [facility] primary care provider. She is s/p pelvic US and labs. All were WNL. She did not have cx done. Pt is monogamous. The bleeding is typically light but, present. She is off all hormones for at least 6 months. Pt would like to conceive. H/o PCOS.    The following portions of the patient's history were reviewed and updated as appropriate: allergies, current medications, past family history, past medical history, past social history, past surgical history and problem list.  Review of Systems:  Pertinent items are noted in HPI.    Objective:  Physical Exam Blood pressure 122/87, pulse 82, weight (!) 452 lb (205 kg).  CONSTITUTIONAL: Well-developed, well-nourished female in no acute distress.  HENT:  Normocephalic, atraumatic EYES: Conjunctivae and EOM are normal. No scleral icterus.  NECK: Normal range of motion SKIN: Skin is warm and dry. No rash noted. Not diaphoretic.No pallor. [place]: Alert and oriented to person, place, and time. Normal coordination.  Pelvic: Normal appearing external genitalia; normal appearing vaginal mucosa and cervix.  Normal discharge. Small amount of blood at os. Very min blood in the vault.   Labs and Imaging 06/03/2021 INDICATION: Pelvic and perineal pain   COMPARISON: None   TECHNIQUE: Transabdominal pelvic ultrasound.   FINDINGS: The uterus is anteverted and measures 7.6 x 2.9 x 4.4 cm with an 11 mm endometrial stripe. Normal right ovary measures 3.3 x 1.4 x 1.9 cm. Normal left ovary measures 2.1 x 1.5 x 1.4 cm. No ascites. Procedure Note  Marjory Lies, MD - 06/03/2021  Formatting of this note might be different from the original.  INDICATION: Pelvic and perineal pain   COMPARISON: None   TECHNIQUE: Transabdominal pelvic ultrasound.   FINDINGS: The uterus is  anteverted and measures 7.6 x 2.9 x 4.4 cm with an 11 mm endometrial stripe. Normal right ovary measures 3.3 x 1.4 x 1.9 cm. Normal left ovary measures 2.1 x 1.5 x 1.4 cm. No ascites.    IMPRESSION: Normal study.    Assessment & Plan:  AUB thought due to PCOS. Pt with h/o anovulatory cycles. D/w pt the hormonal nature of anovulatory cycles. Even though she wants to conceive, with the daily bleeding this is not likely. Rec 3 month cycles of OCPs to reset her cycles and endometrium.   Will do STI screen to complete the workup for AUB. Was not prev done.   Diagnoses and all orders for this visit:  Abnormal uterine bleeding (AUB) -     US PELVIS TRANSVAGINAL NON-OB (TV ONLY); Future -     TSH -     CBC -     GC/Chlamydia probe amp ([place])not at [facility] -     norgestrel-ethinyl estradiol (LO/OVRAL) 0.3-30 MG-MCG tablet; Take 1 tablet by mouth daily.  Obesity, morbid, BMI 50 or higher ([place]) -     GC/Chlamydia probe amp ([place])not at [facility]  Screening for STD (sexually transmitted disease) -     GC/Chlamydia probe amp ([place])not at [facility]  PCOS (polycystic ovarian syndrome) -     norgestrel-ethinyl estradiol (LO/OVRAL) 0.3-30 MG-MCG tablet; Take 1 tablet by mouth daily. F/u in 3 months or sooner prn   Clodagh Odenthal L. Harraway-Smith, M.D., [name]

## 2021-07-11 NOTE — Addendum Note (Signed)
Addended by: Phill Myron on: 07/11/2021 12:53 PM   Modules accepted: Orders

## 2021-07-12 LAB — GC/CHLAMYDIA PROBE AMP (~~LOC~~) NOT AT ARMC
Chlamydia: NEGATIVE
Comment: NEGATIVE
Comment: NORMAL
Neisseria Gonorrhea: NEGATIVE

## 2021-07-17 ENCOUNTER — Encounter: Payer: Self-pay | Admitting: Obstetrics & Gynecology

## 2021-07-26 ENCOUNTER — Encounter (INDEPENDENT_AMBULATORY_CARE_PROVIDER_SITE_OTHER): Payer: Self-pay

## 2021-07-26 DIAGNOSIS — Z319 Encounter for procreative management, unspecified: Secondary | ICD-10-CM | POA: Diagnosis not present

## 2021-07-26 DIAGNOSIS — E282 Polycystic ovarian syndrome: Secondary | ICD-10-CM | POA: Diagnosis not present

## 2021-07-26 DIAGNOSIS — N926 Irregular menstruation, unspecified: Secondary | ICD-10-CM | POA: Diagnosis not present

## 2021-07-26 DIAGNOSIS — N970 Female infertility associated with anovulation: Secondary | ICD-10-CM | POA: Diagnosis not present

## 2021-07-27 ENCOUNTER — Other Ambulatory Visit (INDEPENDENT_AMBULATORY_CARE_PROVIDER_SITE_OTHER): Payer: Self-pay | Admitting: Family Medicine

## 2021-07-27 DIAGNOSIS — F3289 Other specified depressive episodes: Secondary | ICD-10-CM

## 2021-07-28 ENCOUNTER — Ambulatory Visit (INDEPENDENT_AMBULATORY_CARE_PROVIDER_SITE_OTHER): Payer: BC Managed Care – PPO | Admitting: Family Medicine

## 2021-07-28 ENCOUNTER — Other Ambulatory Visit: Payer: Self-pay

## 2021-07-28 VITALS — BP 117/70 | HR 72 | Temp 98.1°F | Ht 65.0 in | Wt >= 6400 oz

## 2021-07-28 DIAGNOSIS — Z9189 Other specified personal risk factors, not elsewhere classified: Secondary | ICD-10-CM

## 2021-07-28 DIAGNOSIS — Z6841 Body Mass Index (BMI) 40.0 and over, adult: Secondary | ICD-10-CM

## 2021-07-28 DIAGNOSIS — F4320 Adjustment disorder, unspecified: Secondary | ICD-10-CM | POA: Diagnosis not present

## 2021-07-28 DIAGNOSIS — I10 Essential (primary) hypertension: Secondary | ICD-10-CM | POA: Diagnosis not present

## 2021-07-28 DIAGNOSIS — F3289 Other specified depressive episodes: Secondary | ICD-10-CM

## 2021-07-28 MED ORDER — BD PEN NEEDLE MICRO U/F 32G X 6 MM MISC: 0 refills | Status: DC

## 2021-07-28 MED ORDER — SAXENDA 18 MG/3ML ~~LOC~~ SOPN: PEN_INJECTOR | SUBCUTANEOUS | 0 refills | Status: DC

## 2021-07-28 MED ORDER — METOPROLOL SUCCINATE ER 100 MG PO TB24: 100.0000 mg | ORAL_TABLET | Freq: Every day | ORAL | 0 refills | Status: DC

## 2021-07-28 MED ORDER — BUPROPION HCL ER (XL) 300 MG PO TB24: 300.0000 mg | ORAL_TABLET | Freq: Every morning | ORAL | 0 refills | Status: DC

## 2021-07-28 NOTE — Progress Notes (Signed)
Chief Complaint:   OBESITY Brandy Saunders is here to discuss her progress with her obesity treatment plan along with follow-up of her obesity related diagnoses. Brandy Saunders is on the Category 3 Plan and states she is following her eating plan approximately 60% of the time. Brandy Saunders states she is doing 0 minutes 0 times per week.  Today's visit was #: 52 Starting weight: 447 lbs Starting date: 10/11/2017 Today's weight: 452 lbs Today's date: 07/28/2021 Total lbs lost to date: 0 Total lbs lost since last in-office visit: 0  Interim History: Brandy Saunders has had a lot of stress with her health, and she notes some stress and comfort eating. She tried to portion control and make smarter choices. She is working on meal planning and prepping, and trying to increase protein. She is on Saxenda at 1.2 mg.  Subjective:   1. Essential hypertension Mykal's blood pressure is stable on her medications. She denies chest pain or lightheadedness.  2. Other depression with emotional eating Brandy Saunders notes increased emotional eating eating with increased stress. She is seeing a new therapist, and may be getting a workup for ADD.  3. At risk for heart disease Brandy Saunders is at a higher than average risk for cardiovascular disease due to obesity.   Assessment/Plan:   1. Essential hypertension Brandy Saunders will continue working on healthy weight loss and exercise to improve blood pressure control. We will refill metoprolol for 1 month.  - metoprolol succinate (TOPROL-XL) 100 MG 24 hr tablet; Take 1 tablet (100 mg total) by mouth daily. Take with or immediately following a meal.  Dispense: 30 tablet; Refill: 0  2. Other depression with emotional eating Behavior modification techniques were discussed today to help Brandy Saunders deal with her emotional/non-hunger eating behaviors. We will refill Wellbutrin XL for 1 month. Orders and follow up as documented in patient record.   - buPROPion (WELLBUTRIN XL) 300 MG 24 hr  tablet; Take 1 tablet (300 mg total) by mouth in the morning.  Dispense: 30 tablet; Refill: 0  3. At risk for heart disease Brandy Saunders was given approximately 15 minutes of coronary artery disease prevention counseling today. She is 30 y.o. female and has risk factors for heart disease including obesity. We discussed intensive lifestyle modifications today with an emphasis on specific weight loss instructions and strategies.   Repetitive spaced learning was employed today to elicit superior memory formation and behavioral change.  4. Obesity with current BMI 75.2 Brandy Saunders is currently in the action stage of change. As such, her goal is to continue with weight loss efforts. She has agreed to the Category 3 Plan.   We discussed various medication options to help Brandy Saunders with her weight loss efforts and we both agreed to continue Saxenda, and we will refill for 1 month, and we will refill nano needles #100 with no refills.  - Insulin Pen Needle (BD PEN NEEDLE MICRO U/F) 32G X 6 MM MISC; USE WITH SAXENDA DAILY  Dispense: 100 each; Refill: 0 - Liraglutide -Weight Management (SAXENDA) 18 MG/3ML SOPN; INJECT 3 MG DOSE INTO THE SKIN DAILY  Dispense: 15 mL; Refill: 0  Behavioral modification strategies: increasing lean protein intake and meal planning and cooking strategies.  Brandy Saunders has agreed to follow-up with our clinic in 4 to 5 weeks. She was informed of the importance of frequent follow-up visits to maximize her success with intensive lifestyle modifications for her multiple health conditions.   Objective:   Blood pressure 117/70, pulse 72, temperature 98.1 F (36.7 C), height 5'  5" (1.651 m), weight (!) 452 lb (205 kg), last menstrual period 07/20/2021, SpO2 98 %. Body mass index is 75.22 kg/m.  General: Cooperative, alert, well developed, in no acute distress. HEENT: Conjunctivae and lids unremarkable. Cardiovascular: Regular rhythm.  Lungs: Normal work of breathing. Neurologic: No focal  deficits.   Lab Results  Component Value Date   CREATININE 0.72 05/25/2021   BUN 12 05/25/2021   NA 138 05/25/2021   K 4.0 05/25/2021   CL 103 05/25/2021   CO2 24 05/25/2021   Lab Results  Component Value Date   ALT 38 07/18/2020   AST 26 07/18/2020   ALKPHOS 48 07/18/2020   BILITOT 0.7 07/18/2020   Lab Results  Component Value Date   HGBA1C 5.6 12/03/2019   HGBA1C 5.8 (H) 06/17/2019   HGBA1C 5.8 (H) 05/02/2018   HGBA1C 5.7 (H) 01/17/2018   HGBA1C 6.0 (H) 10/11/2017   Lab Results  Component Value Date   INSULIN 30.8 (H) 12/03/2019   INSULIN 35.9 (H) 06/17/2019   INSULIN 30.3 (H) 05/02/2018   INSULIN 39.5 (H) 01/17/2018   INSULIN 73.6 (H) 10/11/2017   Lab Results  Component Value Date   TSH 1.90 02/26/2020   Lab Results  Component Value Date   CHOL 165 02/26/2020   HDL 57.50 02/26/2020   LDLCALC 87 02/26/2020   TRIG 104.0 02/26/2020   CHOLHDL 3 02/26/2020   Lab Results  Component Value Date   VD25OH 34.27 02/26/2020   VD25OH 39.8 12/03/2019   VD25OH 28.1 (L) 06/17/2019   Lab Results  Component Value Date   WBC 6.0 05/25/2021   HGB 13.4 05/25/2021   HCT 40.5 05/25/2021   MCV 84.6 05/25/2021   PLT 272 05/25/2021   No results found for: IRON, TIBC, FERRITIN  Attestation Statements:   Reviewed by clinician on day of visit: allergies, medications, problem list, medical history, surgical history, family history, social history, and previous encounter notes.   I, Trixie Dredge, am acting as transcriptionist for Dennard Nip, MD.  I have reviewed the above documentation for accuracy and completeness, and I agree with the above. -  Dennard Nip, MD

## 2021-07-28 NOTE — Telephone Encounter (Signed)
Was going through pending tray and came across patients medical record release form. Left a message for patient to call the office regarding this matter. We will need to see which dermatology she went to so we can fax medical record release forms.

## 2021-07-30 ENCOUNTER — Other Ambulatory Visit (INDEPENDENT_AMBULATORY_CARE_PROVIDER_SITE_OTHER): Payer: Self-pay | Admitting: Family Medicine

## 2021-07-30 DIAGNOSIS — I10 Essential (primary) hypertension: Secondary | ICD-10-CM

## 2021-08-01 NOTE — Telephone Encounter (Signed)
LAST APPOINTMENT DATE: 07/28/21 NEXT APPOINTMENT DATE: 09/01/20   [id number] - [name], [place] [address] [place]  [address] Phone: [phone] Fax: [phone]  Patient is requesting a refill of the following medications: Requested Prescriptions   Pending Prescriptions Disp Refills   chlorthalidone (HYGROTON) 25 MG tablet [Pharmacy Med Name: CHLORTHALIDONE 25 MG TABLET] 15 tablet 0    Sig: TAKE 1/2 TABLET BY MOUTH EVERY DAY    Date last filled: 04/20/21 Previously prescribed by Dr. Leafy Ro  Lab Results  Component Value Date   HGBA1C 5.6 12/03/2019   HGBA1C 5.8 (H) 06/17/2019   HGBA1C 5.8 (H) 05/02/2018   Lab Results  Component Value Date   LDLCALC 87 02/26/2020   CREATININE 0.72 05/25/2021   Lab Results  Component Value Date   VD25OH 34.27 02/26/2020   VD25OH 39.8 12/03/2019   VD25OH 28.1 (L) 06/17/2019    BP Readings from Last 3 Encounters:  07/28/21 117/70  07/11/21 122/87  06/30/21 109/78

## 2021-08-08 NOTE — Telephone Encounter (Signed)
Have not heard back from patient and a few voice messages have been left for patient. Medical record request form has been faxed to [facility] at [phone].

## 2021-08-15 NOTE — Telephone Encounter (Signed)
Dr. Ernst Bowler did you ever get patients records? Please advise.

## 2021-08-16 NOTE — Telephone Encounter (Signed)
I have faxed medical record release form again.

## 2021-08-16 NOTE — Telephone Encounter (Signed)
No I did not.   Salvatore Marvel, MD [facility]

## 2021-08-18 DIAGNOSIS — F4320 Adjustment disorder, unspecified: Secondary | ICD-10-CM | POA: Diagnosis not present

## 2021-08-24 ENCOUNTER — Other Ambulatory Visit (INDEPENDENT_AMBULATORY_CARE_PROVIDER_SITE_OTHER): Payer: Self-pay | Admitting: Family Medicine

## 2021-08-24 DIAGNOSIS — I10 Essential (primary) hypertension: Secondary | ICD-10-CM

## 2021-08-24 NOTE — Telephone Encounter (Signed)
LAST APPOINTMENT DATE: 07/28/21 NEXT APPOINTMENT DATE: 09/15/21   [id number] [name], [place] [address] [place] [address] Phone: [phone] Fax: [phone]  Patient is requesting a refill of the following medications: Requested Prescriptions   Pending Prescriptions Disp Refills   chlorthalidone (HYGROTON) 25 MG tablet [Pharmacy Med Name: CHLORTHALIDONE 25 MG TABLET] 15 tablet 0    Sig: TAKE 1/2 TABLET BY MOUTH EVERY DAY    Date last filled: 08/01/21 Previously prescribed by Dr. Leafy Ro  Lab Results  Component Value Date   HGBA1C 5.6 12/03/2019   HGBA1C 5.8 (H) 06/17/2019   HGBA1C 5.8 (H) 05/02/2018   Lab Results  Component Value Date   LDLCALC 87 02/26/2020   CREATININE 0.72 05/25/2021   Lab Results  Component Value Date   VD25OH 34.27 02/26/2020   VD25OH 39.8 12/03/2019   VD25OH 28.1 (L) 06/17/2019    BP Readings from Last 3 Encounters:  07/28/21 117/70  07/11/21 122/87  06/30/21 109/78

## 2021-08-24 NOTE — Telephone Encounter (Signed)
I called the patient to verify that we sent the records form to the right office. She stated she went to dermatologist office and the once that diagnosed her with eczema and proscribed hydrocortisone cream was [facility] - [address] [place], [place] [address] Phone : [phone] Fax : [phone].  She is not sure what the other office was that told her that her eczema was something else. I did call [facility] office and left a message for the nurses to call the office back about the requested information.

## 2021-08-24 NOTE — Telephone Encounter (Signed)
[facility] office called our office back. The nurse said that their front office faxed over the records 3 times, but kept getting the record request form faxed back asking for the same records. Their fax machine each time/ stated the fax was completed. I verified the fax number and our address with the nurse. She plans to refax the records over again today. If we still have issues she will have her manager find another way for [place] to get the information without having the patient pick them up to deliver it to our office.

## 2021-08-28 ENCOUNTER — Other Ambulatory Visit (INDEPENDENT_AMBULATORY_CARE_PROVIDER_SITE_OTHER): Payer: Self-pay | Admitting: Family Medicine

## 2021-08-28 DIAGNOSIS — F3289 Other specified depressive episodes: Secondary | ICD-10-CM

## 2021-08-30 NOTE — Telephone Encounter (Signed)
LAST APPOINTMENT DATE: 07/28/21 NEXT APPOINTMENT DATE: 09/15/21   [id number] [name], [place] [address] [place] [address] Phone: [phone] Fax: [phone]  Patient is requesting a refill of the following medications: Requested Prescriptions   Pending Prescriptions Disp Refills   buPROPion (WELLBUTRIN XL) 300 MG 24 hr tablet [Pharmacy Med Name: BUPROPION HCL XL 300 MG TABLET] 30 tablet 0    Sig: TAKE 1 TABLET (300 MG TOTAL) BY MOUTH IN THE MORNING.    Date last filled: 07/28/21 Previously prescribed by Dr. Leafy Ro  Lab Results  Component Value Date   HGBA1C 5.6 12/03/2019   HGBA1C 5.8 (H) 06/17/2019   HGBA1C 5.8 (H) 05/02/2018   Lab Results  Component Value Date   LDLCALC 87 02/26/2020   CREATININE 0.72 05/25/2021   Lab Results  Component Value Date   VD25OH 34.27 02/26/2020   VD25OH 39.8 12/03/2019   VD25OH 28.1 (L) 06/17/2019    BP Readings from Last 3 Encounters:  07/28/21 117/70  07/11/21 122/87  06/30/21 109/78

## 2021-09-01 ENCOUNTER — Ambulatory Visit (INDEPENDENT_AMBULATORY_CARE_PROVIDER_SITE_OTHER): Payer: BC Managed Care – PPO | Admitting: Family Medicine

## 2021-09-05 ENCOUNTER — Other Ambulatory Visit (INDEPENDENT_AMBULATORY_CARE_PROVIDER_SITE_OTHER): Payer: Self-pay | Admitting: Family Medicine

## 2021-09-05 DIAGNOSIS — E559 Vitamin D deficiency, unspecified: Secondary | ICD-10-CM

## 2021-09-05 NOTE — Telephone Encounter (Signed)
Did we ever receive these records?

## 2021-09-05 NOTE — Telephone Encounter (Signed)
I personally have not seen them in the [facility] faxes since I go through them so frequently. I'm not sure if someone else has come across them.

## 2021-09-07 DIAGNOSIS — F4320 Adjustment disorder, unspecified: Secondary | ICD-10-CM | POA: Diagnosis not present

## 2021-09-07 NOTE — Telephone Encounter (Signed)
LAST APPOINTMENT DATE: 07/28/21 NEXT APPOINTMENT DATE: 09/15/21   [id number] [name], [place] [address] Phone: [phone] Fax: [phone]  Patient is requesting a refill of the following medications: Requested Prescriptions   Pending Prescriptions Disp Refills   Vitamin D, Ergocalciferol, (DRISDOL) 1.25 MG (50000 UNIT) CAPS capsule [Pharmacy Med Name: VITAMIN D2 1.25MG (50,000 UNIT)] 4 capsule 0    Sig: TAKE 1 CAPSULE BY MOUTH EVERY 7 DAYS FOR 28 DAYS.    Date last filled: Unknown Previously prescribed by Historical Provider  Lab Results  Component Value Date   HGBA1C 5.6 12/03/2019   HGBA1C 5.8 (H) 06/17/2019   HGBA1C 5.8 (H) 05/02/2018   Lab Results  Component Value Date   LDLCALC 87 02/26/2020   CREATININE 0.72 05/25/2021   Lab Results  Component Value Date   VD25OH 34.27 02/26/2020   VD25OH 39.8 12/03/2019   VD25OH 28.1 (L) 06/17/2019    BP Readings from Last 3 Encounters:  07/28/21 117/70  07/11/21 122/87  06/30/21 109/78

## 2021-09-15 ENCOUNTER — Other Ambulatory Visit: Payer: Self-pay

## 2021-09-15 ENCOUNTER — Encounter (INDEPENDENT_AMBULATORY_CARE_PROVIDER_SITE_OTHER): Payer: Self-pay | Admitting: Family Medicine

## 2021-09-15 ENCOUNTER — Ambulatory Visit (INDEPENDENT_AMBULATORY_CARE_PROVIDER_SITE_OTHER): Payer: BC Managed Care – PPO | Admitting: Family Medicine

## 2021-09-15 VITALS — BP 137/75 | HR 77 | Temp 98.6°F | Ht 65.0 in | Wt >= 6400 oz

## 2021-09-15 DIAGNOSIS — E785 Hyperlipidemia, unspecified: Secondary | ICD-10-CM | POA: Diagnosis not present

## 2021-09-15 DIAGNOSIS — Z6841 Body Mass Index (BMI) 40.0 and over, adult: Secondary | ICD-10-CM

## 2021-09-15 DIAGNOSIS — E559 Vitamin D deficiency, unspecified: Secondary | ICD-10-CM

## 2021-09-15 DIAGNOSIS — R7303 Prediabetes: Secondary | ICD-10-CM

## 2021-09-15 DIAGNOSIS — I10 Essential (primary) hypertension: Secondary | ICD-10-CM | POA: Diagnosis not present

## 2021-09-15 DIAGNOSIS — F3289 Other specified depressive episodes: Secondary | ICD-10-CM

## 2021-09-15 DIAGNOSIS — E669 Obesity, unspecified: Secondary | ICD-10-CM

## 2021-09-15 DIAGNOSIS — Z9189 Other specified personal risk factors, not elsewhere classified: Secondary | ICD-10-CM | POA: Diagnosis not present

## 2021-09-15 MED ORDER — SAXENDA 18 MG/3ML ~~LOC~~ SOPN: PEN_INJECTOR | SUBCUTANEOUS | 0 refills | Status: DC

## 2021-09-15 MED ORDER — VITAMIN D (ERGOCALCIFEROL) 1.25 MG (50000 UNIT) PO CAPS: ORAL_CAPSULE | ORAL | 0 refills | Status: DC

## 2021-09-15 MED ORDER — BUPROPION HCL ER (XL) 300 MG PO TB24: 300.0000 mg | ORAL_TABLET | Freq: Every morning | ORAL | 0 refills | Status: DC

## 2021-09-15 MED ORDER — VITAMIN D 50 MCG (2000 UT) PO CAPS: 1.0000 | ORAL_CAPSULE | Freq: Every day | ORAL | 0 refills | Status: AC

## 2021-09-15 MED ORDER — CHLORTHALIDONE 25 MG PO TABS: 12.5000 mg | ORAL_TABLET | Freq: Every day | ORAL | 0 refills | Status: DC

## 2021-09-15 MED ORDER — METOPROLOL SUCCINATE ER 100 MG PO TB24: 100.0000 mg | ORAL_TABLET | Freq: Every day | ORAL | 0 refills | Status: DC

## 2021-09-15 NOTE — Progress Notes (Signed)
Chief Complaint:   OBESITY Brandy Saunders is here to discuss her progress with her obesity treatment plan along with follow-up of her obesity related diagnoses. Brandy Saunders is on the Category 3 Plan and states she is following her eating plan approximately 60% of the time. Brandy Saunders states she is doing 0 minutes 0 times per week.  Today's visit was #: 10 Starting weight: 447 lbs Starting date: 10/11/2017 Today's weight: 434 lbs Today's date: 09/15/2021 Total lbs lost to date: 13 Total lbs lost since last in-office visit: 18  Interim History: Brandy Saunders continues to do well with weight loss in the last 6 weeks. She is working on Psychologist, counselling and increased lean protein. She has been doing [employer] meal prep.  Subjective:   1. Vitamin D deficiency Brandy Saunders is on Vit D, and she is overdue for labs.  2. Essential hypertension Brandy Saunders's blood pressure is controlled today. She is doing well with diet and exercise. She notes some increase in headache, but she is drinking extra water.  3. Pre-diabetes Brandy Saunders is working on diet and exercise. She is on a GLP-1 and she is doing well. She is due for labs.  4. Hyperlipidemia, unspecified hyperlipidemia type Brandy Saunders is working on diet and weight loss. She is due to have labs checked to monitor her progress.  5. Other depression with emotional eating Brandy Saunders is stable on Wellbutrin. She is doing well with recognizing emotional eating behaviors, and she has decreased stress/comfort eating.  6. At risk for heart disease Brandy Saunders is at a higher than average risk for cardiovascular disease due to obesity.   Assessment/Plan:   1. Vitamin D deficiency Low Vitamin D level contributes to fatigue and are associated with obesity, breast, and colon cancer. We will check labs today. We will refill prescription Vitamin D 50,000 IU every week #4 for 1 month. Brandy Saunders will follow-up for routine testing of Vitamin D, at least 2-3 times per year to  avoid over-replacement.  - VITAMIN D 25 Hydroxy (Vit-D Deficiency, Fractures)  2. Essential hypertension We will check labs today, and we will refill metoprolol and chlorthalidone for 1 month. Brandy Saunders is working on healthy weight loss and exercise to improve blood pressure control. We will watch for signs of hypotension as she continues her lifestyle modifications.  - chlorthalidone (HYGROTON) 25 MG tablet; Take 0.5 tablets (12.5 mg total) by mouth daily.  Dispense: 15 tablet; Refill: 0 - metoprolol succinate (TOPROL-XL) 100 MG 24 hr tablet; Take 1 tablet (100 mg total) by mouth daily. Take with or immediately following a meal.  Dispense: 30 tablet; Refill: 0 - CMP14+EGFR - TSH  3. Pre-diabetes We will check labs today. Brandy Saunders will continue to work on diet, exercise, and decreasing simple carbohydrates to help decrease the risk of diabetes.   - CMP14+EGFR - Insulin, random - Hemoglobin A1c  4. Hyperlipidemia, unspecified hyperlipidemia type Cardiovascular risk and specific lipid/LDL goals reviewed. We discussed several lifestyle modifications today. We will check labs today. Brandy Saunders will continue to work on diet, exercise and weight loss efforts. Orders and follow up as documented in patient record.   - Lipid Panel With LDL/HDL Ratio  5. Other depression with emotional eating Behavior modification techniques were discussed today to help Brandy Saunders deal with her emotional/non-hunger eating behaviors.  Orders and follow up as documented in patient record.   - buPROPion (WELLBUTRIN XL) 300 MG 24 hr tablet; Take 1 tablet (300 mg total) by mouth in the morning.  Dispense: 30 tablet; Refill: 0  6. At risk for heart disease Brandy Saunders was given approximately 15 minutes of coronary artery disease prevention counseling today. She is 31 y.o. female and has risk factors for heart disease including obesity. We discussed intensive lifestyle modifications today with an emphasis on specific weight  loss instructions and strategies.   Repetitive spaced learning was employed today to elicit superior memory formation and behavioral change.  7. Obesity with current BMI 72.3 Brandy Saunders is currently in the action stage of change. As such, her goal is to continue with weight loss efforts. She has agreed to keeping a food journal and adhering to recommended goals of 1500 calories and 85 protein daily.   We discussed various medication options to help Brandy Saunders with her weight loss efforts and we both agreed to continue Saxenda 3.0 mg, and we will refill for 1 month (she is to continue to take 1.8 mg).  - Liraglutide -Weight Management (SAXENDA) 18 MG/3ML SOPN; INJECT 3 MG DOSE INTO THE SKIN DAILY  Dispense: 15 mL; Refill: 0  Exercise goals: Starting back to the gym.  Behavioral modification strategies: meal planning and cooking strategies.  Brandy Saunders has agreed to follow-up with our clinic in 3 to 4 weeks. She was informed of the importance of frequent follow-up visits to maximize her success with intensive lifestyle modifications for her multiple health conditions.   Brandy Saunders was informed we would discuss her lab results at her next visit unless there is a critical issue that needs to be addressed sooner. Brandy Saunders agreed to keep her next visit at the agreed upon time to discuss these results.  Objective:   Blood pressure 137/75, pulse 77, temperature 98.6 F (37 C), height 5' 5"  (1.651 m), weight (!) 434 lb (196.9 kg), SpO2 98 %. Body mass index is 72.22 kg/m.  General: Cooperative, alert, well developed, in no acute distress. HEENT: Conjunctivae and lids unremarkable. Cardiovascular: Regular rhythm.  Lungs: Normal work of breathing. Neurologic: No focal deficits.   Lab Results  Component Value Date   CREATININE 0.72 05/25/2021   BUN 12 05/25/2021   NA 138 05/25/2021   K 4.0 05/25/2021   CL 103 05/25/2021   CO2 24 05/25/2021   Lab Results  Component Value Date   ALT 38  07/18/2020   AST 26 07/18/2020   ALKPHOS 48 07/18/2020   BILITOT 0.7 07/18/2020   Lab Results  Component Value Date   HGBA1C 5.6 12/03/2019   HGBA1C 5.8 (H) 06/17/2019   HGBA1C 5.8 (H) 05/02/2018   HGBA1C 5.7 (H) 01/17/2018   HGBA1C 6.0 (H) 10/11/2017   Lab Results  Component Value Date   INSULIN 30.8 (H) 12/03/2019   INSULIN 35.9 (H) 06/17/2019   INSULIN 30.3 (H) 05/02/2018   INSULIN 39.5 (H) 01/17/2018   INSULIN 73.6 (H) 10/11/2017   Lab Results  Component Value Date   TSH 1.90 02/26/2020   Lab Results  Component Value Date   CHOL 165 02/26/2020   HDL 57.50 02/26/2020   LDLCALC 87 02/26/2020   TRIG 104.0 02/26/2020   CHOLHDL 3 02/26/2020   Lab Results  Component Value Date   VD25OH 34.27 02/26/2020   VD25OH 39.8 12/03/2019   VD25OH 28.1 (L) 06/17/2019   Lab Results  Component Value Date   WBC 6.0 05/25/2021   HGB 13.4 05/25/2021   HCT 40.5 05/25/2021   MCV 84.6 05/25/2021   PLT 272 05/25/2021   No results found for: IRON, TIBC, FERRITIN  Attestation Statements:   Reviewed by clinician on day of visit:  allergies, medications, problem list, medical history, surgical history, family history, social history, and previous encounter notes.   I, Trixie Dredge, am acting as transcriptionist for Dennard Nip, MD.  I have reviewed the above documentation for accuracy and completeness, and I agree with the above. -  Dennard Nip, MD

## 2021-09-16 LAB — VITAMIN D 25 HYDROXY (VIT D DEFICIENCY, FRACTURES): Vit D, 25-Hydroxy: 65.5 ng/mL (ref 30.0–100.0)

## 2021-09-16 LAB — CMP14+EGFR
ALT: 49 IU/L — ABNORMAL HIGH (ref 0–32)
AST: 31 IU/L (ref 0–40)
Albumin/Globulin Ratio: 1.8 (ref 1.2–2.2)
Albumin: 4.5 g/dL (ref 3.8–4.8)
Alkaline Phosphatase: 54 IU/L (ref 44–121)
BUN/Creatinine Ratio: 17 (ref 9–23)
BUN: 13 mg/dL (ref 6–20)
Bilirubin Total: 0.5 mg/dL (ref 0.0–1.2)
CO2: 25 mmol/L (ref 20–29)
Calcium: 9.4 mg/dL (ref 8.7–10.2)
Chloride: 101 mmol/L (ref 96–106)
Creatinine, Ser: 0.76 mg/dL (ref 0.57–1.00)
Globulin, Total: 2.5 g/dL (ref 1.5–4.5)
Glucose: 92 mg/dL (ref 70–99)
Potassium: 4.4 mmol/L (ref 3.5–5.2)
Sodium: 140 mmol/L (ref 134–144)
Total Protein: 7 g/dL (ref 6.0–8.5)
eGFR: 107 mL/min/{1.73_m2} (ref >59)

## 2021-09-16 LAB — LIPID PANEL WITH LDL/HDL RATIO
Cholesterol, Total: 173 mg/dL (ref 100–199)
HDL: 47 mg/dL (ref >39)
LDL Chol Calc (NIH): 102 mg/dL — ABNORMAL HIGH (ref 0–99)
LDL/HDL Ratio: 2.2 ratio (ref 0.0–3.2)
Triglycerides: 133 mg/dL (ref 0–149)
VLDL Cholesterol Cal: 24 mg/dL (ref 5–40)

## 2021-09-16 LAB — TSH: TSH: 1.88 u[IU]/mL (ref 0.450–4.500)

## 2021-09-16 LAB — HEMOGLOBIN A1C
Est. average glucose Bld gHb Est-mCnc: 117 mg/dL
Hgb A1c MFr Bld: 5.7 % — ABNORMAL HIGH (ref 4.8–5.6)

## 2021-09-16 LAB — INSULIN, RANDOM: INSULIN: 43.1 u[IU]/mL — ABNORMAL HIGH (ref 2.6–24.9)

## 2021-09-27 ENCOUNTER — Other Ambulatory Visit (INDEPENDENT_AMBULATORY_CARE_PROVIDER_SITE_OTHER): Payer: Self-pay | Admitting: Family Medicine

## 2021-09-27 ENCOUNTER — Encounter (INDEPENDENT_AMBULATORY_CARE_PROVIDER_SITE_OTHER): Payer: Self-pay | Admitting: Family Medicine

## 2021-09-27 DIAGNOSIS — F3289 Other specified depressive episodes: Secondary | ICD-10-CM

## 2021-09-27 DIAGNOSIS — I10 Essential (primary) hypertension: Secondary | ICD-10-CM

## 2021-09-27 NOTE — Telephone Encounter (Signed)
Please see message and advise.  Thank you. ° °

## 2021-09-28 ENCOUNTER — Other Ambulatory Visit (INDEPENDENT_AMBULATORY_CARE_PROVIDER_SITE_OTHER): Payer: Self-pay | Admitting: Family Medicine

## 2021-09-28 DIAGNOSIS — E559 Vitamin D deficiency, unspecified: Secondary | ICD-10-CM

## 2021-09-28 DIAGNOSIS — F4320 Adjustment disorder, unspecified: Secondary | ICD-10-CM | POA: Diagnosis not present

## 2021-10-03 DIAGNOSIS — Z31430 Encounter of female for testing for genetic disease carrier status for procreative management: Secondary | ICD-10-CM | POA: Diagnosis not present

## 2021-10-12 ENCOUNTER — Ambulatory Visit: Payer: BC Managed Care – PPO | Admitting: Obstetrics & Gynecology

## 2021-10-13 ENCOUNTER — Ambulatory Visit (INDEPENDENT_AMBULATORY_CARE_PROVIDER_SITE_OTHER): Payer: BC Managed Care – PPO | Admitting: Family Medicine

## 2021-10-13 DIAGNOSIS — T887XXA Unspecified adverse effect of drug or medicament, initial encounter: Secondary | ICD-10-CM | POA: Diagnosis not present

## 2021-10-23 ENCOUNTER — Other Ambulatory Visit (INDEPENDENT_AMBULATORY_CARE_PROVIDER_SITE_OTHER): Payer: Self-pay | Admitting: Family Medicine

## 2021-10-23 DIAGNOSIS — E559 Vitamin D deficiency, unspecified: Secondary | ICD-10-CM

## 2021-10-24 NOTE — Telephone Encounter (Signed)
LAST APPOINTMENT DATE: 09/15/21 NEXT APPOINTMENT DATE: 11/08/21   [id number] [name], [place] [address] Phone: [phone] Fax: [phone]  Patient is requesting a refill of the following medications: Requested Prescriptions   Pending Prescriptions Disp Refills   Vitamin D, Ergocalciferol, (DRISDOL) 1.25 MG (50000 UNIT) CAPS capsule [Pharmacy Med Name: VITAMIN D2 1.25MG (50,000 UNIT)] 4 capsule 0    Sig: TAKE 1 CAPSULE BY MOUTH EVERY 7 DAYS FOR 28 DAYS.    Date last filled: 09/15/21 Previously prescribed by Dr. Leafy Ro  Lab Results  Component Value Date   HGBA1C 5.7 (H) 09/15/2021   HGBA1C 5.6 12/03/2019   HGBA1C 5.8 (H) 06/17/2019   Lab Results  Component Value Date   LDLCALC 102 (H) 09/15/2021   CREATININE 0.76 09/15/2021   Lab Results  Component Value Date   VD25OH 65.5 09/15/2021   VD25OH 34.27 02/26/2020   VD25OH 39.8 12/03/2019    BP Readings from Last 3 Encounters:  09/15/21 137/75  07/28/21 117/70  07/11/21 122/87

## 2021-10-24 NOTE — Telephone Encounter (Signed)
Dr.Beasley 

## 2021-10-25 ENCOUNTER — Other Ambulatory Visit (INDEPENDENT_AMBULATORY_CARE_PROVIDER_SITE_OTHER): Payer: Self-pay | Admitting: Family Medicine

## 2021-10-25 DIAGNOSIS — I10 Essential (primary) hypertension: Secondary | ICD-10-CM

## 2021-10-25 DIAGNOSIS — F3289 Other specified depressive episodes: Secondary | ICD-10-CM

## 2021-10-25 NOTE — Telephone Encounter (Signed)
Dr.Beasley 

## 2021-10-25 NOTE — Telephone Encounter (Signed)
LAST APPOINTMENT DATE: 09/15/21 NEXT APPOINTMENT DATE: 11/08/21   [id number] [place], [place] [address] Phone: [phone] Fax: [phone]  Patient is requesting a refill of the following medications: Requested Prescriptions   Pending Prescriptions Disp Refills   buPROPion (WELLBUTRIN XL) 300 MG 24 hr tablet [Pharmacy Med Name: BUPROPION HCL XL 300 MG TABLET] 90 tablet 1    Sig: TAKE 1 TABLET (300 MG TOTAL) BY MOUTH IN THE MORNING    Date last filled: 09/15/21  #30 Previously prescribed by Dr. Leafy Ro  Lab Results  Component Value Date   HGBA1C 5.7 (H) 09/15/2021   HGBA1C 5.6 12/03/2019   HGBA1C 5.8 (H) 06/17/2019   Lab Results  Component Value Date   LDLCALC 102 (H) 09/15/2021   CREATININE 0.76 09/15/2021   Lab Results  Component Value Date   VD25OH 65.5 09/15/2021   VD25OH 34.27 02/26/2020   VD25OH 39.8 12/03/2019    BP Readings from Last 3 Encounters:  09/15/21 137/75  07/28/21 117/70  07/11/21 122/87

## 2021-10-25 NOTE — Telephone Encounter (Signed)
LAST APPOINTMENT DATE: 09/15/21 NEXT APPOINTMENT DATE: 11/15/21   [id number] [name], [place] - [place] [address] Phone: [phone] Fax: [phone]  Patient is requesting a refill of the following medications: Requested Prescriptions   Pending Prescriptions Disp Refills   chlorthalidone (HYGROTON) 25 MG tablet [Pharmacy Med Name: CHLORTHALIDONE 25 MG TABLET] 15 tablet 0    Sig: TAKE 1/2 TABLET BY MOUTH EVERY DAY    Date last filled: 09/15/21 Previously prescribed by Dr. Leafy Ro  Lab Results  Component Value Date   HGBA1C 5.7 (H) 09/15/2021   HGBA1C 5.6 12/03/2019   HGBA1C 5.8 (H) 06/17/2019   Lab Results  Component Value Date   LDLCALC 102 (H) 09/15/2021   CREATININE 0.76 09/15/2021   Lab Results  Component Value Date   VD25OH 65.5 09/15/2021   VD25OH 34.27 02/26/2020   VD25OH 39.8 12/03/2019    BP Readings from Last 3 Encounters:  09/15/21 137/75  07/28/21 117/70  07/11/21 122/87

## 2021-10-26 DIAGNOSIS — F4320 Adjustment disorder, unspecified: Secondary | ICD-10-CM | POA: Diagnosis not present

## 2021-10-29 ENCOUNTER — Other Ambulatory Visit (INDEPENDENT_AMBULATORY_CARE_PROVIDER_SITE_OTHER): Payer: Self-pay | Admitting: Family Medicine

## 2021-10-29 DIAGNOSIS — F3289 Other specified depressive episodes: Secondary | ICD-10-CM

## 2021-10-31 NOTE — Telephone Encounter (Signed)
Dr.Beasley 

## 2021-11-06 ENCOUNTER — Other Ambulatory Visit (INDEPENDENT_AMBULATORY_CARE_PROVIDER_SITE_OTHER): Payer: Self-pay | Admitting: Family Medicine

## 2021-11-06 DIAGNOSIS — F3289 Other specified depressive episodes: Secondary | ICD-10-CM

## 2021-11-07 ENCOUNTER — Other Ambulatory Visit (INDEPENDENT_AMBULATORY_CARE_PROVIDER_SITE_OTHER): Payer: Self-pay | Admitting: Family Medicine

## 2021-11-07 DIAGNOSIS — E559 Vitamin D deficiency, unspecified: Secondary | ICD-10-CM

## 2021-11-07 DIAGNOSIS — F3289 Other specified depressive episodes: Secondary | ICD-10-CM

## 2021-11-07 NOTE — Telephone Encounter (Signed)
Leafy Ro ?

## 2021-11-07 NOTE — Telephone Encounter (Signed)
Dr.Beasley 

## 2021-11-08 ENCOUNTER — Other Ambulatory Visit: Payer: Self-pay

## 2021-11-08 ENCOUNTER — Other Ambulatory Visit (INDEPENDENT_AMBULATORY_CARE_PROVIDER_SITE_OTHER): Payer: Self-pay | Admitting: Family Medicine

## 2021-11-08 ENCOUNTER — Encounter (INDEPENDENT_AMBULATORY_CARE_PROVIDER_SITE_OTHER): Payer: Self-pay | Admitting: Family Medicine

## 2021-11-08 ENCOUNTER — Ambulatory Visit (INDEPENDENT_AMBULATORY_CARE_PROVIDER_SITE_OTHER): Payer: BC Managed Care – PPO | Admitting: Family Medicine

## 2021-11-08 VITALS — BP 93/65 | HR 80 | Temp 97.8°F | Ht 65.0 in | Wt >= 6400 oz

## 2021-11-08 DIAGNOSIS — F3289 Other specified depressive episodes: Secondary | ICD-10-CM

## 2021-11-08 DIAGNOSIS — Z9189 Other specified personal risk factors, not elsewhere classified: Secondary | ICD-10-CM

## 2021-11-08 DIAGNOSIS — I10 Essential (primary) hypertension: Secondary | ICD-10-CM

## 2021-11-08 DIAGNOSIS — R7303 Prediabetes: Secondary | ICD-10-CM

## 2021-11-08 DIAGNOSIS — E559 Vitamin D deficiency, unspecified: Secondary | ICD-10-CM

## 2021-11-08 DIAGNOSIS — Z6841 Body Mass Index (BMI) 40.0 and over, adult: Secondary | ICD-10-CM

## 2021-11-08 DIAGNOSIS — E669 Obesity, unspecified: Secondary | ICD-10-CM

## 2021-11-08 MED ORDER — METOPROLOL SUCCINATE ER 100 MG PO TB24: 100.0000 mg | ORAL_TABLET | Freq: Every day | ORAL | 0 refills | Status: DC

## 2021-11-08 MED ORDER — BUPROPION HCL ER (XL) 300 MG PO TB24: 300.0000 mg | ORAL_TABLET | Freq: Every morning | ORAL | 0 refills | Status: DC

## 2021-11-08 MED ORDER — VITAMIN D (ERGOCALCIFEROL) 1.25 MG (50000 UNIT) PO CAPS: ORAL_CAPSULE | ORAL | 0 refills | Status: DC

## 2021-11-08 MED ORDER — WEGOVY 1 MG/0.5ML ~~LOC~~ SOAJ: 1.0000 mg | SUBCUTANEOUS | 0 refills | Status: DC

## 2021-11-08 NOTE — Telephone Encounter (Signed)
Dr.Beasley 

## 2021-11-10 ENCOUNTER — Telehealth (INDEPENDENT_AMBULATORY_CARE_PROVIDER_SITE_OTHER): Payer: Self-pay | Admitting: Family Medicine

## 2021-11-10 ENCOUNTER — Encounter (INDEPENDENT_AMBULATORY_CARE_PROVIDER_SITE_OTHER): Payer: Self-pay

## 2021-11-10 NOTE — Telephone Encounter (Signed)
Prior authorization approved for [facility]. Effective 11/10/2021 to 06/12/2022. Patient sent approval message via mychart.  ?

## 2021-11-14 NOTE — Progress Notes (Signed)
? ? ? ?Chief Complaint:  ? ?OBESITY ?Brandy Saunders is here to discuss her progress with her obesity treatment plan along with follow-up of her obesity related diagnoses. Brandy Saunders is on keeping a food journal and adhering to recommended goals of 1500 calories and 85 grams of protein daily and states she is following her eating plan approximately 50% of the time. Brandy Saunders states she is doing 0 minutes 0 times per week. ? ?Today's visit was #: 52 ?Starting weight: 447 lbs ?Starting date: 10/11/2017 ?Today's weight: 440 lbs ?Today's date: 11/08/2021 ?Total lbs lost to date: 7 ?Total lbs lost since last in-office visit: 0 ? ?Interim History: Brandy Saunders is averaging around 1800-2000 calories and 100 grams of protein daily. She switches back and forth between Category 3 and journaling. She is trying to drink more water. She is taking Saxenda 1.8 mg, and she denies side effects. She is planning to start trying to get pregnant once her weight is under 400 lbs. ? ?Subjective:  ? ?1. Vitamin D deficiency ?Brandy Saunders is currently taking prescription vitamin D 50,000 IU each week. She denies nausea, vomiting or muscle weakness. ? ?2. Essential hypertension ?Brandy Saunders is taking Toprol XL 100 mg. She denies side effects or chest pain. ? ?3. Pre-diabetes ?Brandy Saunders's last A1c was 5.7. She recently tried metformin and she stopped due to side effects. ? ?4. Other depression with emotional eating ?Brandy Saunders is taking Wellbutrin XL 300 mg, and she denies side effects. ? ?5. At risk for diabetes mellitus ?Brandy Saunders is at higher than average risk for developing diabetes due to her obesity. ? ?Assessment/Plan:  ? ?1. Vitamin D deficiency ?We will refill prescription Vitamin D for 1 month. Side effects were discussed. Brandy Saunders will follow-up for routine testing of Vitamin D, at least 2-3 times per year to avoid over-replacement. ? ?- Vitamin D, Ergocalciferol, (DRISDOL) 1.25 MG (50000 UNIT) CAPS capsule; Take one po weekly  Dispense: 4 capsule; Refill:  0 ? ?2. Essential hypertension ?We will refill Toprol XL for 1 month. Side effects were discussed. Brandy Saunders will continue working on dietary changes and weight loss to improve blood pressure control. We will watch for signs of hypotension as she continues her lifestyle modifications. ? ?- metoprolol succinate (TOPROL-XL) 100 MG 24 hr tablet; Take 1 tablet (100 mg total) by mouth daily. Take with or immediately following a meal.  Dispense: 30 tablet; Refill: 0 ? ?3. Pre-diabetes ?Brandy Saunders will continue to work on dietary changes, exercise, weight loss, and decreasing simple carbohydrates to help decrease the risk of diabetes.  ? ?4. Other depression with emotional eating ?Behavior modification techniques were discussed today to help Brandy Saunders deal with her emotional/non-hunger eating behaviors. We will refill Wellbutrin XL for 1 month. Side effects were discussed. Orders and follow up as documented in patient record.  ? ?- buPROPion (WELLBUTRIN XL) 300 MG 24 hr tablet; Take 1 tablet (300 mg total) by mouth in the morning.  Dispense: 30 tablet; Refill: 0 ? ?5. At risk for diabetes mellitus ?Brandy Saunders was given approximately 15 minutes of diabetic education and counseling today. We discussed intensive lifestyle modifications today with an emphasis on weight loss as well as increasing exercise and decreasing simple carbohydrates in her diet. We also reviewed medication options with an emphasis on risk versus benefits of those discussed. ? ?Repetitive spaced learning was employed today to elicit superior memory formation and behavioral change. ? ?6. Obesity with current BMI 73.3 ?Brandy Saunders is currently in the action stage of change. As such, her goal is to continue  with weight loss efforts. She has agreed to following a lower carbohydrate, vegetable and lean protein rich diet plan.  ? ?We discussed various medication options to help Brandy Saunders with her weight loss efforts and we both agreed to stop Saxenda, and start Wegovy 1  mg q week with no refills. Side effects were discussed. ? ?- Semaglutide-Weight Management (WEGOVY) 1 MG/0.5ML SOAJ; Inject 1 mg into the skin once a week.  Dispense: 2 mL; Refill: 0 ? ?Behavioral modification strategies: increasing lean protein intake, increasing water intake, no skipping meals, and meal planning and cooking strategies. ? ?Brandy Saunders has agreed to follow-up with our clinic in 4 weeks. She was informed of the importance of frequent follow-up visits to maximize her success with intensive lifestyle modifications for her multiple health conditions.  ? ?Objective:  ? ?Blood pressure 93/65, pulse 80, temperature 97.8 ?F (36.6 ?C), height '5\' 5"'$  (1.651 m), weight (!) 440 lb (199.6 kg), SpO2 99 %. ?Body mass index is 73.22 kg/m?. ? ?General: Cooperative, alert, well developed, in no acute distress. ?HEENT: Conjunctivae and lids unremarkable. ?Cardiovascular: Regular rhythm.  ?Lungs: Normal work of breathing. ?Neurologic: No focal deficits.  ? ?Lab Results  ?Component Value Date  ? CREATININE 0.76 09/15/2021  ? BUN 13 09/15/2021  ? NA 140 09/15/2021  ? K 4.4 09/15/2021  ? CL 101 09/15/2021  ? CO2 25 09/15/2021  ? ?Lab Results  ?Component Value Date  ? ALT 49 (H) 09/15/2021  ? AST 31 09/15/2021  ? ALKPHOS 54 09/15/2021  ? BILITOT 0.5 09/15/2021  ? ?Lab Results  ?Component Value Date  ? HGBA1C 5.7 (H) 09/15/2021  ? HGBA1C 5.6 12/03/2019  ? HGBA1C 5.8 (H) 06/17/2019  ? HGBA1C 5.8 (H) 05/02/2018  ? HGBA1C 5.7 (H) 01/17/2018  ? ?Lab Results  ?Component Value Date  ? INSULIN 43.1 (H) 09/15/2021  ? INSULIN 30.8 (H) 12/03/2019  ? INSULIN 35.9 (H) 06/17/2019  ? INSULIN 30.3 (H) 05/02/2018  ? INSULIN 39.5 (H) 01/17/2018  ? ?Lab Results  ?Component Value Date  ? TSH 1.880 09/15/2021  ? ?Lab Results  ?Component Value Date  ? CHOL 173 09/15/2021  ? HDL 47 09/15/2021  ? LDLCALC 102 (H) 09/15/2021  ? TRIG 133 09/15/2021  ? CHOLHDL 3 02/26/2020  ? ?Lab Results  ?Component Value Date  ? VD25OH 65.5 09/15/2021  ? VD25OH 34.27  02/26/2020  ? VD25OH 39.8 12/03/2019  ? ?Lab Results  ?Component Value Date  ? WBC 6.0 05/25/2021  ? HGB 13.4 05/25/2021  ? HCT 40.5 05/25/2021  ? MCV 84.6 05/25/2021  ? PLT 272 05/25/2021  ? ?No results found for: IRON, TIBC, FERRITIN ? ?Attestation Statements:  ? ?Reviewed by clinician on day of visit: allergies, medications, problem list, medical history, surgical history, family history, social history, and previous encounter notes. ? ? ?I, Trixie Dredge, am acting as transcriptionist for Dennard Nip, MD. ? ?I have reviewed the above documentation for accuracy and completeness, and I agree with the above. -  Dennard Nip, MD ? ? ?

## 2021-11-17 DIAGNOSIS — M25562 Pain in left knee: Secondary | ICD-10-CM | POA: Diagnosis not present

## 2021-11-17 DIAGNOSIS — M1712 Unilateral primary osteoarthritis, left knee: Secondary | ICD-10-CM | POA: Diagnosis not present

## 2021-11-24 DIAGNOSIS — S8992XA Unspecified injury of left lower leg, initial encounter: Secondary | ICD-10-CM | POA: Diagnosis not present

## 2021-11-24 DIAGNOSIS — S8002XA Contusion of left knee, initial encounter: Secondary | ICD-10-CM | POA: Diagnosis not present

## 2021-11-27 IMAGING — DX DG LUMBAR SPINE COMPLETE 4+V
5 series · 5 of 5 positions shown · non-contrast
Comparison: None.

CLINICAL DATA: Evaluate lower back pain, possible L1 or L2
radiculopathy

EXAM:
LUMBAR SPINE - COMPLETE 4+ VIEW

[lumbar spine ap]
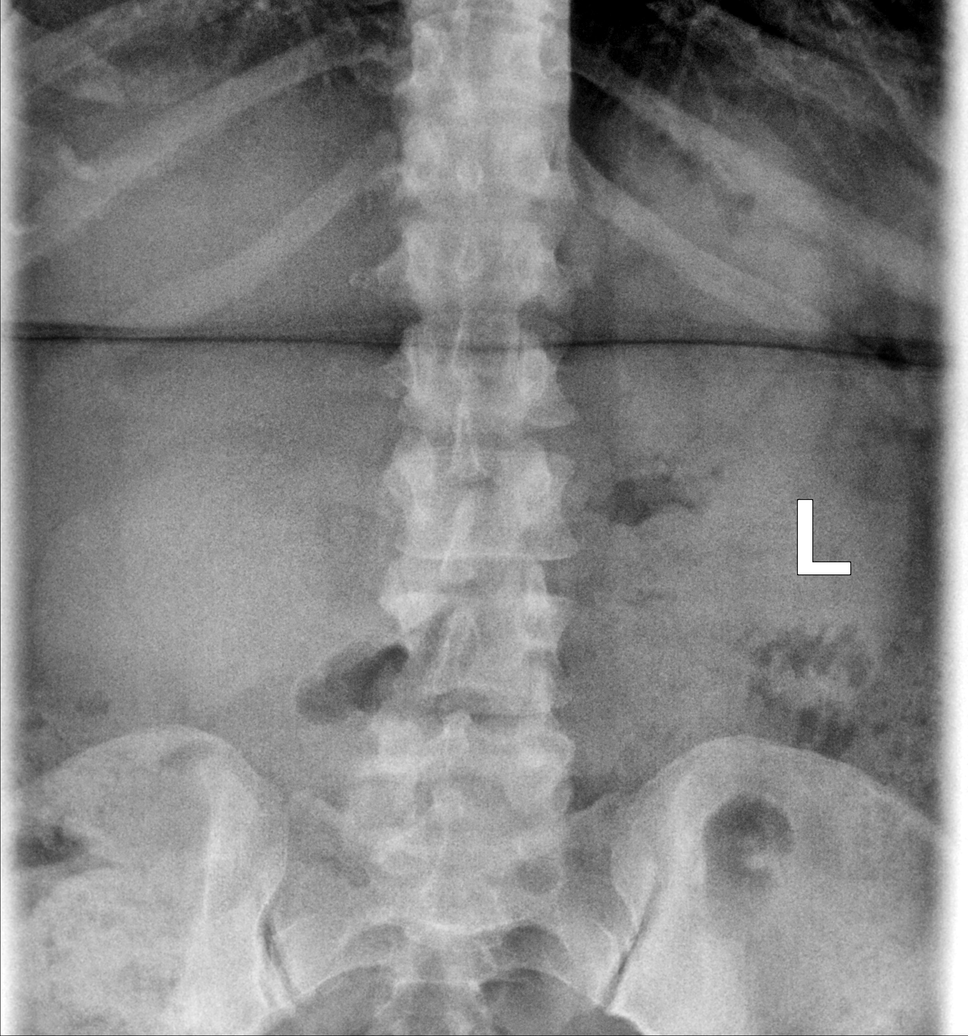

[lumbar spine mlo (1 of 2)]
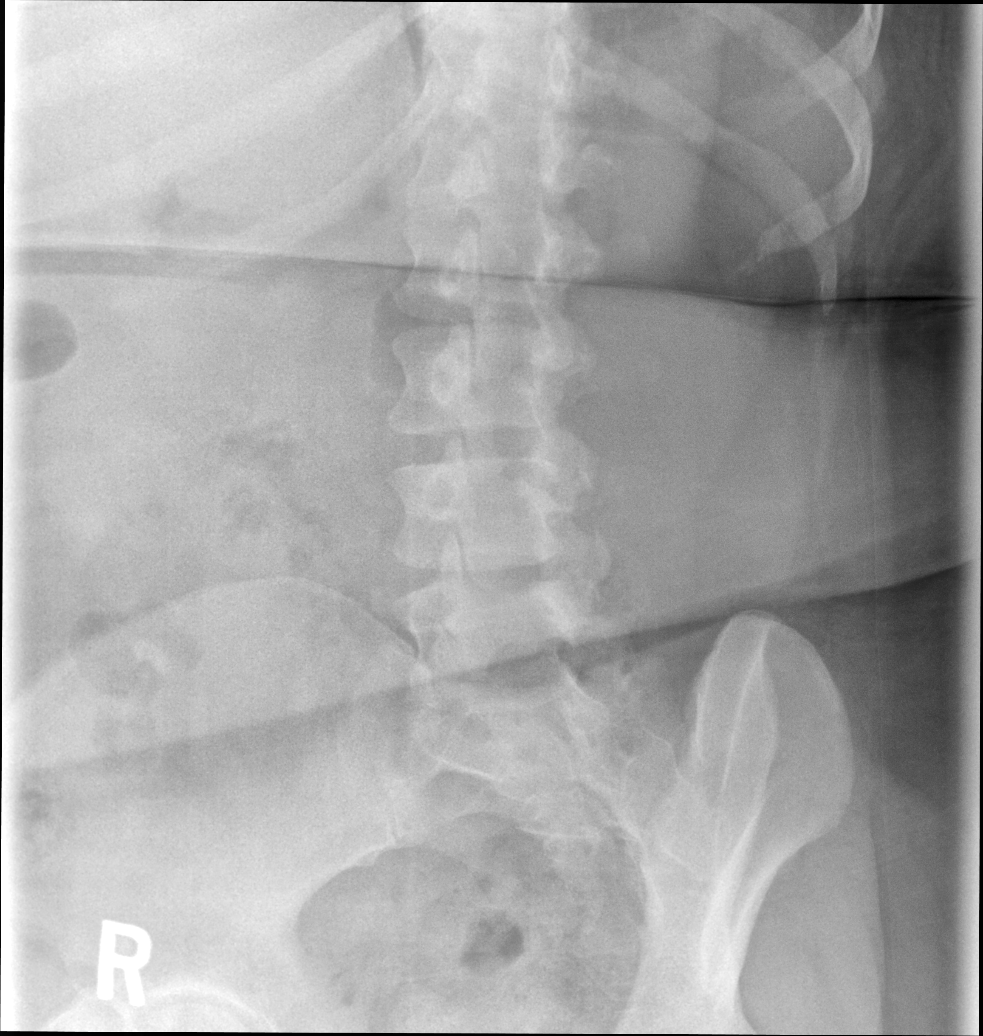

[lumbar spine mlo (2 of 2)]
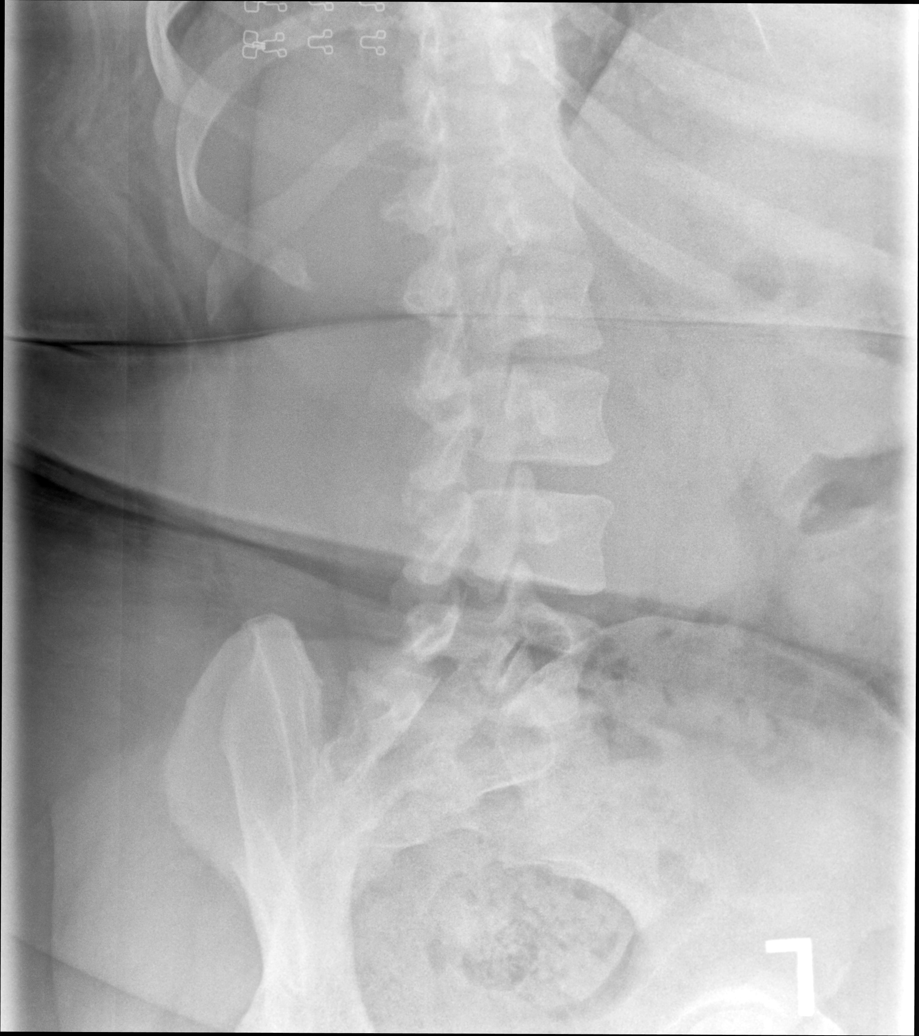

[lumbar spine lat (1 of 2)]
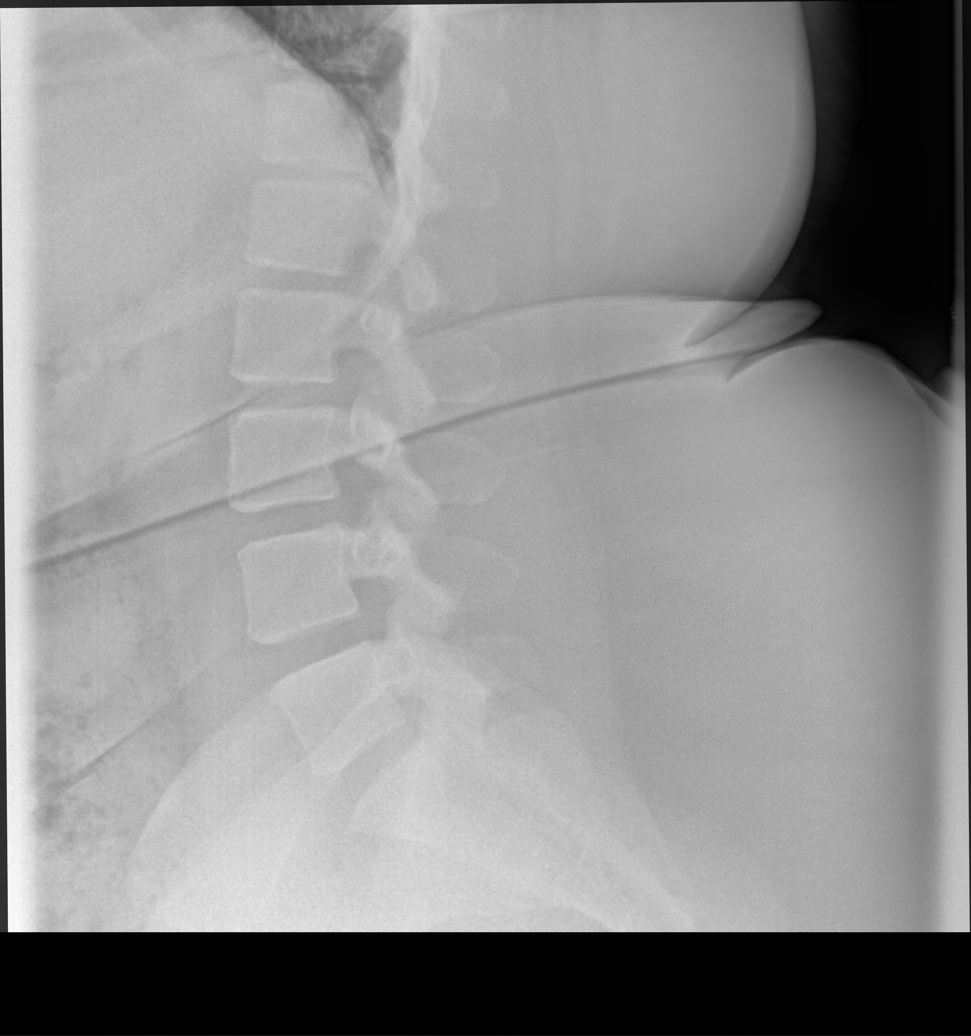

[lumbar spine lat (2 of 2)]
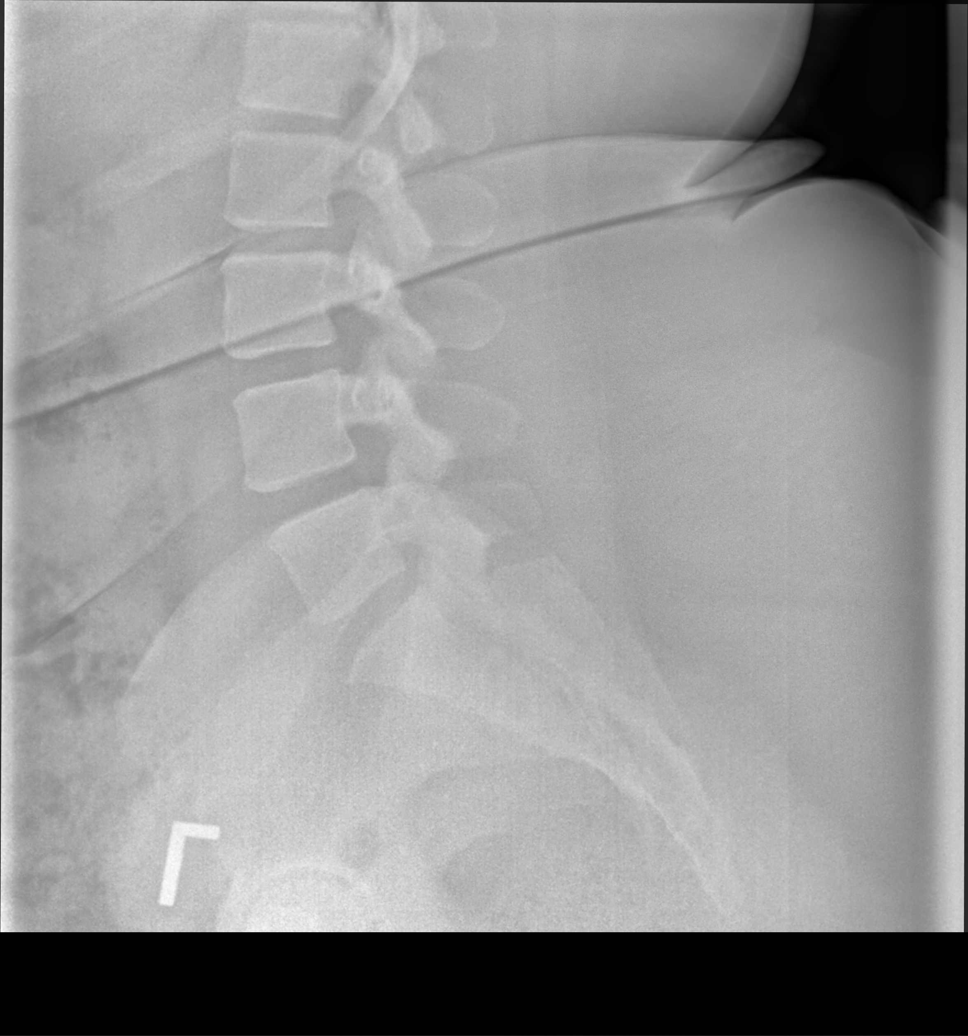

[5 of 5 positions shown; findings below may reference images not displayed]

FINDINGS: Five lumbar type vertebral bodies are noted. No acute fracture or
vertebral body height loss. Preservation of the disc spaces. No
spondylolysis or spondylolisthesis is seen. Included portions of the
bony pelvis are unremarkable. Soft tissues are free of acute
abnormality.
IMPRESSION: No acute osseous abnormality or significant degenerative change.

## 2021-11-27 IMAGING — DX DG HIP (WITH OR WITHOUT PELVIS) 2-3V*L*
2 series · 2 of 2 positions shown · non-contrast
Comparison: None.

CLINICAL DATA: Low back and hip pain, chronic pain, worsening

EXAM:
DG HIP (WITH OR WITHOUT PELVIS) 2-3V LEFT

[hip ap]
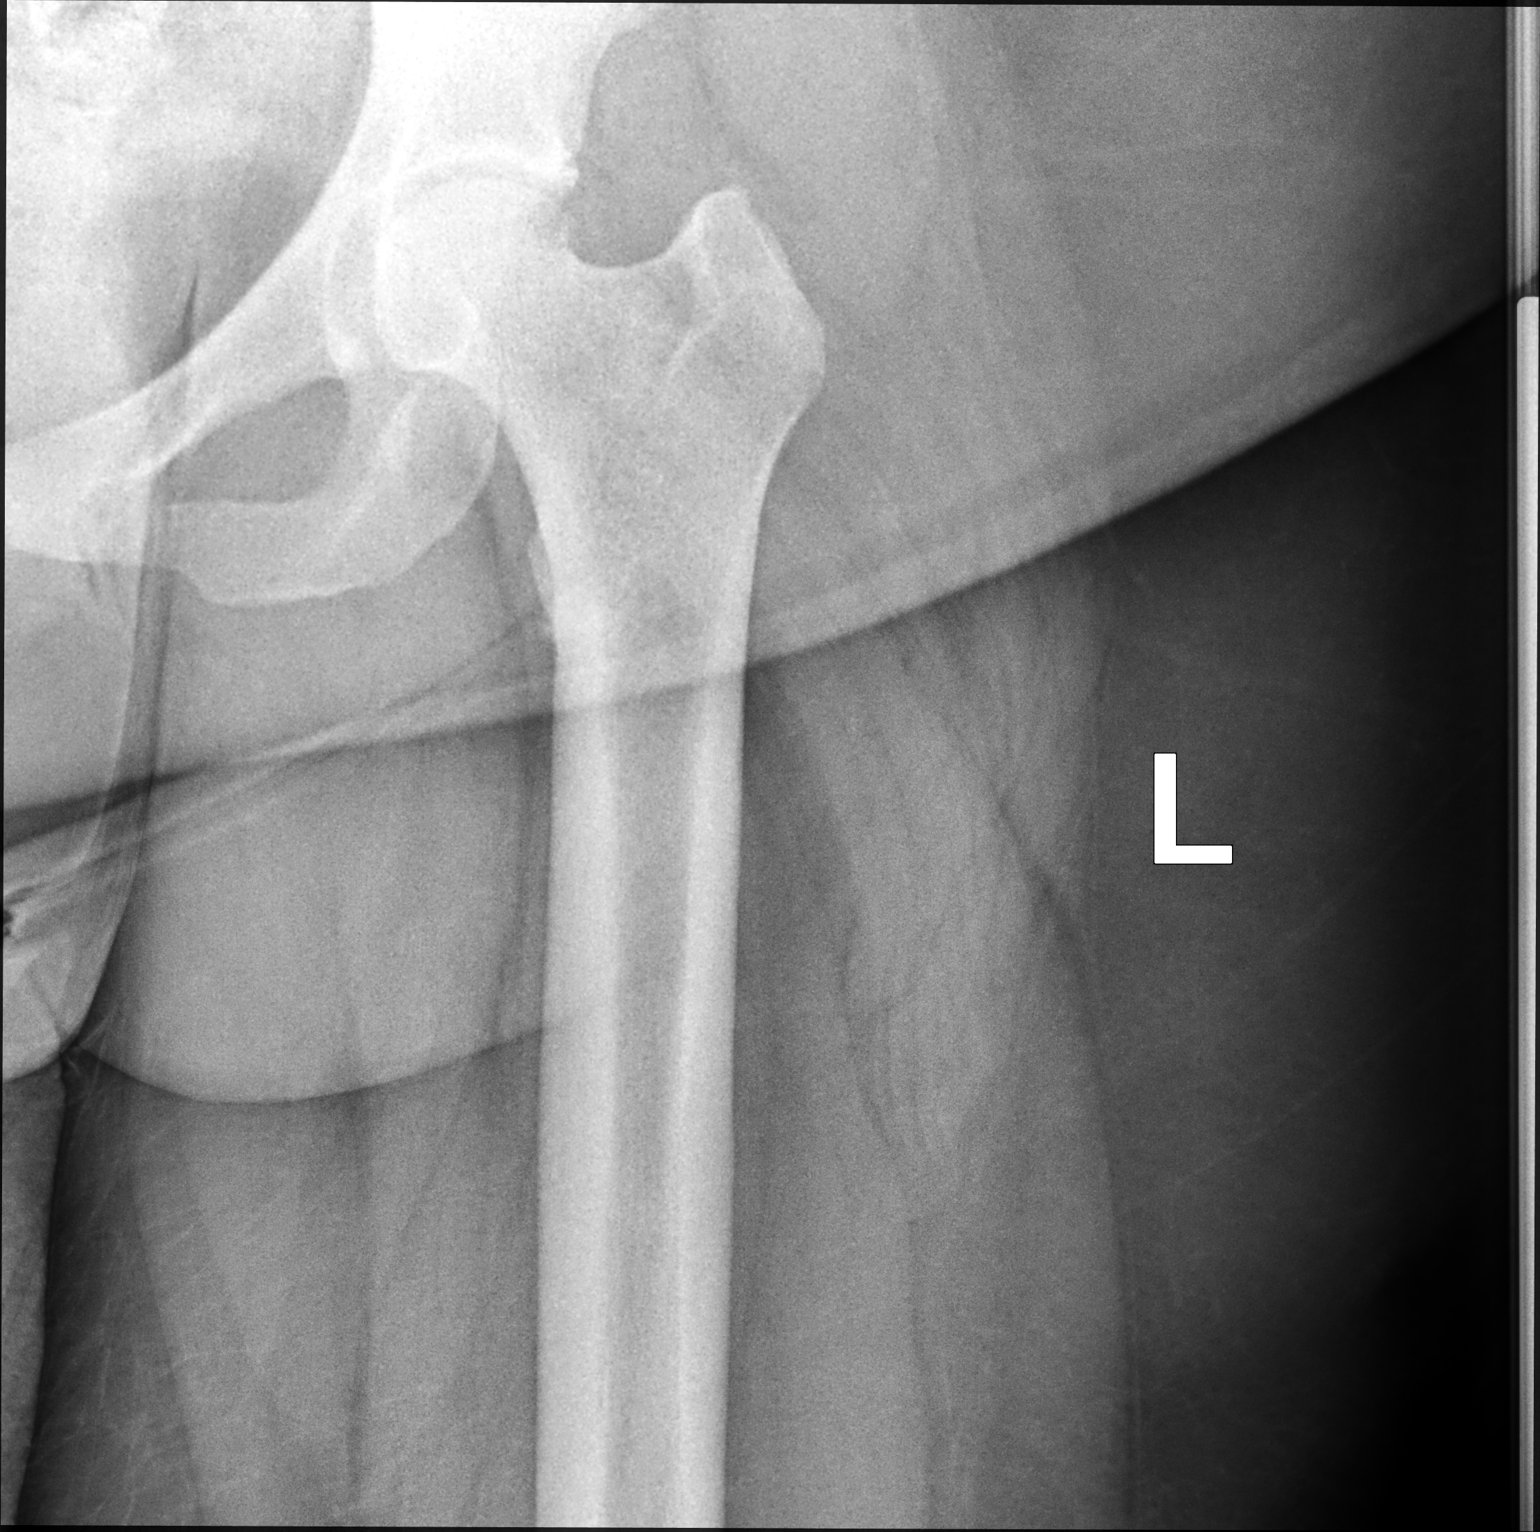

[hip (frog leg)]
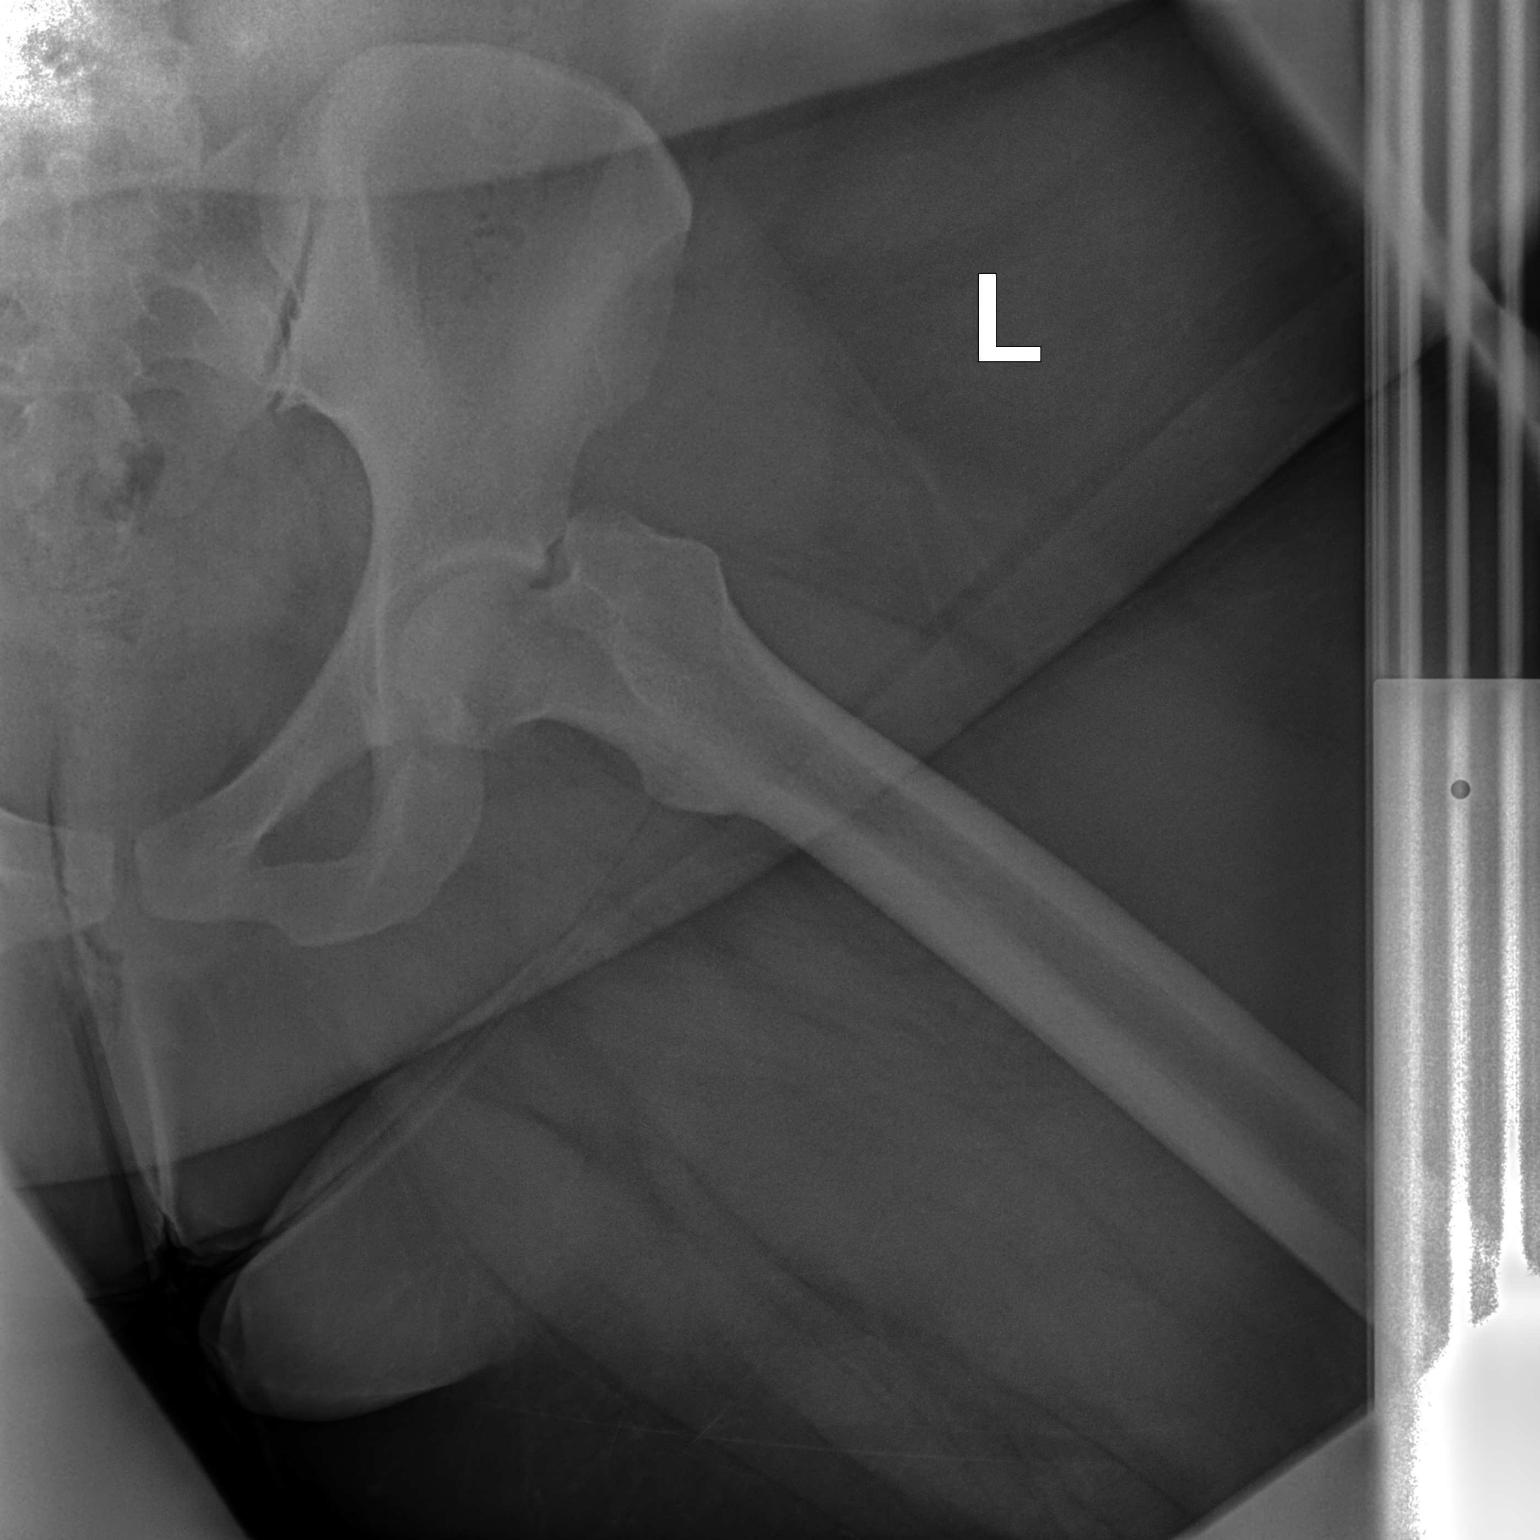

[2 of 2 positions shown; findings below may reference images not displayed]

FINDINGS: There is no evidence of hip fracture or dislocation. No significant
joint space narrowing or other arthropathic findings.
Morphologically normal appearance of the acetabulum and femoral
head. Soft tissues are unremarkable.
IMPRESSION: No acute, developmental or significant degenerative abnormalities in
the left hip.

## 2021-11-30 ENCOUNTER — Other Ambulatory Visit (INDEPENDENT_AMBULATORY_CARE_PROVIDER_SITE_OTHER): Payer: Self-pay | Admitting: Family Medicine

## 2021-11-30 DIAGNOSIS — F3289 Other specified depressive episodes: Secondary | ICD-10-CM

## 2021-12-03 ENCOUNTER — Other Ambulatory Visit (INDEPENDENT_AMBULATORY_CARE_PROVIDER_SITE_OTHER): Payer: Self-pay | Admitting: Family Medicine

## 2021-12-03 DIAGNOSIS — F3289 Other specified depressive episodes: Secondary | ICD-10-CM

## 2021-12-05 ENCOUNTER — Other Ambulatory Visit (INDEPENDENT_AMBULATORY_CARE_PROVIDER_SITE_OTHER): Payer: Self-pay | Admitting: Family Medicine

## 2021-12-06 ENCOUNTER — Ambulatory Visit: Payer: BC Managed Care – PPO | Admitting: Allergy & Immunology

## 2021-12-06 DIAGNOSIS — Z20822 Contact with and (suspected) exposure to covid-19: Secondary | ICD-10-CM | POA: Diagnosis not present

## 2021-12-06 DIAGNOSIS — U071 COVID-19: Secondary | ICD-10-CM | POA: Diagnosis not present

## 2021-12-07 ENCOUNTER — Other Ambulatory Visit (INDEPENDENT_AMBULATORY_CARE_PROVIDER_SITE_OTHER): Payer: Self-pay | Admitting: Family Medicine

## 2021-12-07 DIAGNOSIS — F3289 Other specified depressive episodes: Secondary | ICD-10-CM

## 2021-12-08 ENCOUNTER — Other Ambulatory Visit (INDEPENDENT_AMBULATORY_CARE_PROVIDER_SITE_OTHER): Payer: Self-pay | Admitting: Family Medicine

## 2021-12-08 DIAGNOSIS — F3289 Other specified depressive episodes: Secondary | ICD-10-CM

## 2021-12-08 DIAGNOSIS — E559 Vitamin D deficiency, unspecified: Secondary | ICD-10-CM

## 2021-12-08 DIAGNOSIS — E66813 Obesity, class 3: Secondary | ICD-10-CM

## 2021-12-08 MED ORDER — WEGOVY 1 MG/0.5ML ~~LOC~~ SOAJ: 1.0000 mg | SUBCUTANEOUS | 0 refills | Status: DC

## 2021-12-08 MED ORDER — VITAMIN D (ERGOCALCIFEROL) 1.25 MG (50000 UNIT) PO CAPS: ORAL_CAPSULE | ORAL | 0 refills | Status: DC

## 2021-12-08 MED ORDER — BUPROPION HCL ER (XL) 300 MG PO TB24: 300.0000 mg | ORAL_TABLET | Freq: Every morning | ORAL | 0 refills | Status: DC

## 2021-12-08 NOTE — Telephone Encounter (Signed)
LAST APPOINTMENT DATE: 11/08/21 ?NEXT APPOINTMENT DATE: 12/12/21 ? ? ?[id number] [place]?Phone: [phone]: [phone]? ?Patient is requesting a refill of the following medications: ?Requested Prescriptions  ? ?Pending Prescriptions Disp Refills  ? buPROPion (WELLBUTRIN XL) 300 MG 24 hr tablet 30 tablet 0  ?  Sig: Take 1 tablet (300 mg total) by mouth in the morning.  ? Vitamin D, Ergocalciferol, (DRISDOL) 1.25 MG (50000 UNIT) CAPS capsule 4 capsule 0  ?  Sig: Take one po weekly  ? Semaglutide-Weight Management (WEGOVY) 1 MG/0.5ML SOAJ 2 mL 0  ?  Sig: Inject 1 mg into the skin once a week.  ? ? ?Date last filled: 11/08/21 ?Previously prescribed by BLeafy Ro? ?Lab Results  ?Component Value Date  ? HGBA1C 5.7 (H) 09/15/2021  ? HGBA1C 5.6 12/03/2019  ? HGBA1C 5.8 (H) 06/17/2019  ? ?Lab Results  ?Component Value Date  ? LDLCALC 102 (H) 09/15/2021  ? CREATININE 0.76 09/15/2021  ? ?Lab Results  ?Component Value Date  ? VD25OH 65.5 09/15/2021  ? VD25OH 34.27 02/26/2020  ? VD25OH 39.8 12/03/2019  ? ? ?BP Readings from Last 3 Encounters:  ?11/08/21 93/65  ?09/15/21 137/75  ?07/28/21 117/70  ? ? ?

## 2021-12-08 NOTE — Telephone Encounter (Signed)
Dr.Beasley 

## 2021-12-12 ENCOUNTER — Encounter (INDEPENDENT_AMBULATORY_CARE_PROVIDER_SITE_OTHER): Payer: Self-pay | Admitting: Family Medicine

## 2021-12-12 ENCOUNTER — Telehealth (INDEPENDENT_AMBULATORY_CARE_PROVIDER_SITE_OTHER): Payer: BC Managed Care – PPO | Admitting: Family Medicine

## 2021-12-12 DIAGNOSIS — U071 COVID-19: Secondary | ICD-10-CM

## 2021-12-12 DIAGNOSIS — E669 Obesity, unspecified: Secondary | ICD-10-CM

## 2021-12-12 DIAGNOSIS — Z6841 Body Mass Index (BMI) 40.0 and over, adult: Secondary | ICD-10-CM

## 2021-12-22 NOTE — Progress Notes (Signed)
? ? ?TeleHealth Visit:  ?Due to the COVID-19 pandemic, this visit was completed with telemedicine (audio/video) technology to reduce patient and provider exposure as well as to preserve personal protective equipment.  ? ?Brandy Saunders has verbally consented to this TeleHealth visit. The patient is located at home, the provider is located at the [facility] office. The participants in this visit include the listed provider and patient. The visit was conducted today via MyChart video. ? ? ?Chief Complaint: OBESITY ?Brandy Saunders is here to discuss her progress with her obesity treatment plan along with follow-up of her obesity related diagnoses. Brandy Saunders is on following a lower carbohydrate, vegetable and lean protein rich diet plan and states she is following her eating plan approximately 60% of the time. Brandy Saunders states she is doing 0 minutes 0 times per week. ? ?Today's visit was #: 51 ?Starting weight: 447 lbs ?Starting date: 10/11/2017 ? ?Interim History: Brandy Saunders's appetite was variable, which she is recovering from [place]. She is trying to eat more protein. She is starting to feel better and she is working on being active.  ? ?Subjective:  ? ?1. COVID ?Brandy Saunders is 7 days out from her first symptoms of COVID. She finished her Paxloid and she is feeling better, but she still notes fatigue, cough, and congestion. She is trying to move around frequently and she plans to go back to work tomorrow.  ? ?Assessment/Plan:  ? ?1. COVID ?Brandy Saunders was warned of COVID rebound and to continue to mask for the next few days. She can call us or her PCP with any further questions or concerns. ? ?2. Obesity with current BMI 73.3 ?Brandy Saunders is currently in the action stage of change. As such, her goal is to continue with weight loss efforts. She has agreed to practicing portion control and making smarter food choices, such as increasing vegetables and decreasing simple carbohydrates.  ? ?Exercise goals: Brandy Saunders is to hold off  on formal exercise until she is fully recovered. ? ?Behavioral modification strategies: increasing lean protein intake and increasing vegetables. ? ?Brandy Saunders has agreed to follow-up with our clinic in 4 weeks. She was informed of the importance of frequent follow-up visits to maximize her success with intensive lifestyle modifications for her multiple health conditions. ? ?Objective:  ? ?VITALS: Per patient if applicable, see vitals. ?GENERAL: Alert and in no acute distress. ?CARDIOPULMONARY: No increased WOB. Speaking in clear sentences.  ?PSYCH: Pleasant and cooperative. Speech normal rate and rhythm. Affect is appropriate. Insight and judgement are appropriate. Attention is focused, linear, and appropriate.  ?NEURO: Oriented as arrived to appointment on time with no prompting.  ? ?Lab Results  ?Component Value Date  ? CREATININE 0.76 09/15/2021  ? BUN 13 09/15/2021  ? NA 140 09/15/2021  ? K 4.4 09/15/2021  ? CL 101 09/15/2021  ? CO2 25 09/15/2021  ? ?Lab Results  ?Component Value Date  ? ALT 49 (H) 09/15/2021  ? AST 31 09/15/2021  ? ALKPHOS 54 09/15/2021  ? BILITOT 0.5 09/15/2021  ? ?Lab Results  ?Component Value Date  ? HGBA1C 5.7 (H) 09/15/2021  ? HGBA1C 5.6 12/03/2019  ? HGBA1C 5.8 (H) 06/17/2019  ? HGBA1C 5.8 (H) 05/02/2018  ? HGBA1C 5.7 (H) 01/17/2018  ? ?Lab Results  ?Component Value Date  ? INSULIN 43.1 (H) 09/15/2021  ? INSULIN 30.8 (H) 12/03/2019  ? INSULIN 35.9 (H) 06/17/2019  ? INSULIN 30.3 (H) 05/02/2018  ? INSULIN 39.5 (H) 01/17/2018  ? ?Lab Results  ?Component Value Date  ? TSH  1.880 09/15/2021  ? ?Lab Results  ?Component Value Date  ? CHOL 173 09/15/2021  ? HDL 47 09/15/2021  ? LDLCALC 102 (H) 09/15/2021  ? TRIG 133 09/15/2021  ? CHOLHDL 3 02/26/2020  ? ?Lab Results  ?Component Value Date  ? VD25OH 65.5 09/15/2021  ? VD25OH 34.27 02/26/2020  ? VD25OH 39.8 12/03/2019  ? ?Lab Results  ?Component Value Date  ? WBC 6.0 05/25/2021  ? HGB 13.4 05/25/2021  ? HCT 40.5 05/25/2021  ? MCV 84.6 05/25/2021  ?  PLT 272 05/25/2021  ? ?No results found for: IRON, TIBC, FERRITIN ? ?Attestation Statements:  ? ?Reviewed by clinician on day of visit: allergies, medications, problem list, medical history, surgical history, family history, social history, and previous encounter notes. ? ? ?I, Trixie Dredge, am acting as transcriptionist for Dennard Nip, MD. ? ?I have reviewed the above documentation for accuracy and completeness, and I agree with the above. - Dennard Nip, MD ? ? ?

## 2021-12-26 ENCOUNTER — Other Ambulatory Visit (INDEPENDENT_AMBULATORY_CARE_PROVIDER_SITE_OTHER): Payer: Self-pay | Admitting: Family Medicine

## 2021-12-26 DIAGNOSIS — E559 Vitamin D deficiency, unspecified: Secondary | ICD-10-CM

## 2021-12-26 DIAGNOSIS — L98491 Non-pressure chronic ulcer of skin of other sites limited to breakdown of skin: Secondary | ICD-10-CM | POA: Diagnosis not present

## 2021-12-30 DIAGNOSIS — I890 Lymphedema, not elsewhere classified: Secondary | ICD-10-CM | POA: Diagnosis not present

## 2021-12-30 DIAGNOSIS — R7303 Prediabetes: Secondary | ICD-10-CM | POA: Diagnosis not present

## 2021-12-30 DIAGNOSIS — S81002A Unspecified open wound, left knee, initial encounter: Secondary | ICD-10-CM | POA: Diagnosis not present

## 2021-12-31 ENCOUNTER — Other Ambulatory Visit (INDEPENDENT_AMBULATORY_CARE_PROVIDER_SITE_OTHER): Payer: Self-pay | Admitting: Family Medicine

## 2021-12-31 DIAGNOSIS — F3289 Other specified depressive episodes: Secondary | ICD-10-CM

## 2022-01-02 ENCOUNTER — Other Ambulatory Visit (INDEPENDENT_AMBULATORY_CARE_PROVIDER_SITE_OTHER): Payer: Self-pay | Admitting: Family Medicine

## 2022-01-02 DIAGNOSIS — S81002D Unspecified open wound, left knee, subsequent encounter: Secondary | ICD-10-CM | POA: Diagnosis not present

## 2022-01-02 DIAGNOSIS — S8992XD Unspecified injury of left lower leg, subsequent encounter: Secondary | ICD-10-CM | POA: Diagnosis not present

## 2022-01-02 DIAGNOSIS — S8002XD Contusion of left knee, subsequent encounter: Secondary | ICD-10-CM | POA: Diagnosis not present

## 2022-01-02 DIAGNOSIS — F3289 Other specified depressive episodes: Secondary | ICD-10-CM

## 2022-01-09 ENCOUNTER — Ambulatory Visit (INDEPENDENT_AMBULATORY_CARE_PROVIDER_SITE_OTHER): Payer: BC Managed Care – PPO | Admitting: Family Medicine

## 2022-01-09 ENCOUNTER — Encounter (INDEPENDENT_AMBULATORY_CARE_PROVIDER_SITE_OTHER): Payer: Self-pay | Admitting: Family Medicine

## 2022-01-09 VITALS — BP 98/62 | HR 89 | Temp 98.0°F | Ht 65.0 in | Wt >= 6400 oz

## 2022-01-09 DIAGNOSIS — E669 Obesity, unspecified: Secondary | ICD-10-CM

## 2022-01-09 DIAGNOSIS — Z9189 Other specified personal risk factors, not elsewhere classified: Secondary | ICD-10-CM

## 2022-01-09 DIAGNOSIS — F3289 Other specified depressive episodes: Secondary | ICD-10-CM | POA: Diagnosis not present

## 2022-01-09 DIAGNOSIS — Z6841 Body Mass Index (BMI) 40.0 and over, adult: Secondary | ICD-10-CM

## 2022-01-09 DIAGNOSIS — I10 Essential (primary) hypertension: Secondary | ICD-10-CM

## 2022-01-09 MED ORDER — BUPROPION HCL ER (XL) 300 MG PO TB24: 300.0000 mg | ORAL_TABLET | Freq: Every morning | ORAL | 0 refills | Status: DC

## 2022-01-09 MED ORDER — WEGOVY 1 MG/0.5ML ~~LOC~~ SOAJ: 1.0000 mg | SUBCUTANEOUS | 0 refills | Status: DC

## 2022-01-12 ENCOUNTER — Encounter: Payer: Self-pay | Admitting: Allergy & Immunology

## 2022-01-12 ENCOUNTER — Ambulatory Visit (INDEPENDENT_AMBULATORY_CARE_PROVIDER_SITE_OTHER): Payer: BC Managed Care – PPO | Admitting: Allergy & Immunology

## 2022-01-12 VITALS — BP 112/84 | HR 84 | Temp 97.7°F | Resp 18 | Ht 65.0 in | Wt >= 6400 oz

## 2022-01-12 DIAGNOSIS — J4530 Mild persistent asthma, uncomplicated: Secondary | ICD-10-CM

## 2022-01-12 DIAGNOSIS — J302 Other seasonal allergic rhinitis: Secondary | ICD-10-CM

## 2022-01-12 DIAGNOSIS — J3089 Other allergic rhinitis: Secondary | ICD-10-CM | POA: Diagnosis not present

## 2022-01-12 DIAGNOSIS — R21 Rash and other nonspecific skin eruption: Secondary | ICD-10-CM | POA: Diagnosis not present

## 2022-01-12 MED ORDER — ALBUTEROL SULFATE HFA 108 (90 BASE) MCG/ACT IN AERS: 2.0000 | INHALATION_SPRAY | RESPIRATORY_TRACT | 2 refills | Status: AC | PRN

## 2022-01-12 NOTE — Patient Instructions (Addendum)
1. Mild persistent asthma, uncomplicated - Lung testing looked good.  - Daily controller medication(s): NOTHING - Prior to physical activity: albuterol 2 puffs 10-15 minutes before physical activity. - Rescue medications: albuterol 4 puffs every 4-6 hours as needed - Asthma control goals:  * Full participation in all desired activities (may need albuterol before activity) * Albuterol use two time or less a week on average (not counting use with activity) * Cough interfering with sleep two time or less a month * Oral steroids no more than once a year * No hospitalizations  2. Seasonal and perennial allergic rhinitis (dust mites, cat, dog, grasses, and molds) - Consider Allegra (fexofenadine) '180mg'$  to see if this causes LESS sleepiness.    3. Rash - Continue with the triamcinolone as needed. - Take pictures of future reactions.   4. Return in about 1 year (around 01/13/2023).    Please inform us of any Emergency Department visits, hospitalizations, or changes in symptoms. Call us before going to the ED for breathing or allergy symptoms since we might be able to fit you in for a sick visit. Feel free to contact us anytime with any questions, problems, or concerns.  It was a pleasure to see you again today as always!   Websites that have reliable patient information: 1. American Academy of Asthma, Allergy, and Immunology: www.aaaai.org 2. Food Allergy Research and Education (FARE): foodallergy.org 3. Mothers of Asthmatics: http://www.asthmacommunitynetwork.org 4. American College of Allergy, Asthma, and Immunology: www.acaai.org   COVID-19 Vaccine Information can be found at: ShippingScam.co.uk For questions related to vaccine distribution or appointments, please email [email or web address] or call [phone].   We realize that you might be concerned about having an allergic reaction to the COVID19 vaccines. To help with that  concern, WE ARE OFFERING THE COVID19 VACCINES IN OUR OFFICE! Ask the front desk for dates!     "Like" [place] on Facebook and Instagram for our latest updates!      A healthy democracy works best when [employer] participate! Make sure you are registered to vote! If you have moved or changed any of your contact information, you will need to get this updated before voting!  In some cases, you MAY be able to register to vote online: [email or web address]

## 2022-01-12 NOTE — Progress Notes (Signed)
FOLLOW UP  Date of Service/Encounter:  01/12/22   Assessment:    Rash - unclear etiology but improved with PRN triamcinolone   Mild persistent asthma, uncomplicated   GERD - recommended taking famotidine BID when the symptoms start (patient prefers to not taking something on a daily basis)   Complicated past medical history, including obesity    Plan/Recommendations:   1. Mild persistent asthma, uncomplicated - Lung testing looked good.  - Daily controller medication(s): NOTHING - Prior to physical activity: albuterol 2 puffs 10-15 minutes before physical activity. - Rescue medications: albuterol 4 puffs every 4-6 hours as needed - Asthma control goals:  * Full participation in all desired activities (may need albuterol before activity) * Albuterol use two time or less a week on average (not counting use with activity) * Cough interfering with sleep two time or less a month * Oral steroids no more than once a year * No hospitalizations  2. Seasonal and perennial allergic rhinitis (dust mites, cat, dog, grasses, and molds) - Consider Allegra (fexofenadine) '180mg'$  to see if this causes LESS sleepiness.    3. Rash - Continue with the triamcinolone as needed. - Take pictures of future reactions.   4. Return in about 1 year (around 01/13/2023).   Subjective:   Brandy Saunders is a 31 y.o. female presenting today for follow up of  Chief Complaint  Patient presents with   Asthma    6 mth f/u - Pretty good; manageable    Brandy Saunders has a history of the following: Patient Active Problem List   Diagnosis Date Noted   Hypertension 01/09/2022   Sciatica of left side 02/18/2021   Allergies 07/26/2020   Lymphedema 05/05/2020   Vitamin D deficiency 01/22/2020   Rash 12/16/2019   Nevus 12/16/2019   Depression 10/19/2019   Hyperlipidemia 09/18/2019   Spasm of thoracic back muscle 09/18/2019   Palpitations 08/30/2018   Obstructive sleep apnea 08/30/2018    Pain of left calf 12/14/2017   Other fatigue 10/11/2017   Shortness of breath on exertion 10/11/2017   Prediabetes 10/11/2017   Chronic left-sided thoracic back pain 05/23/2017   Acute left-sided thoracic back pain 05/09/2017   Migraine 05/09/2017   Essential hypertension 05/09/2017   Common migraine with intractable migraine 02/02/2017   Chronic fatigue 01/06/2016   Fatty liver 02/15/2015   Esophageal reflux 02/15/2015   Late menses 07/30/2014   Class 3 severe obesity with serious comorbidity and body mass index (BMI) greater than or equal to 70 in adult ([place]) 07/30/2014   Asthma without acute exacerbation 07/30/2014   IBS (irritable bowel syndrome) 07/30/2014   Elevated BP 07/30/2014   PCOS (polycystic ovarian syndrome) 11/25/2012   Carpal tunnel syndrome, bilateral     History obtained from: chart review and patient.  Brandy Saunders is a 31 y.o. female presenting for a follow up visit.  She was last seen in October 2022.  At that time, she was doing good with intermittent use of her controller medication.  Mostly she was using albuterol as needed.  For her allergic rhinitis, we continue with Zyrtec 10 mg daily and discussed using Allegra if she was having sleepiness with the Zyrtec.  Her rash was under good control with the minocycline as needed.  Since last visit, she has done well.  She is around 3 months from her 109 year wedding anniversary.  Asthma/Respiratory Symptom History: She is not using the albuterol frequently. She thinks that she has used it 3-4 times  in total in the last 3-4 months. She reports that there are some days where she becomes more heavy with her breathing.  She has not needed steroids and has not been to the ED for her symptoms.   She reports that she had COVID last month.  She was fully vaccinated.  She did receive Paxlovid.  Skin Symptom History: Her rash has not showed up but this typically presents during the summer time.  She has triamcinolone to use as  needed. The last time that the rash popped up was last summer. This is typically only happening in the summer months. The last documentation was July 2023.   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 She is looking to expand her horizons. 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  Her husband does Librarian, academic.  Otherwise, there have been no changes to her past medical history, surgical history, family history, or social history.    Review of Systems  Constitutional: Negative.  Negative for chills, fever, malaise/fatigue and weight loss.  HENT: Negative.  Negative for congestion, ear discharge, ear pain and sinus pain.   Eyes:  Negative for pain, discharge and redness.  Respiratory:  Negative for cough, sputum production, shortness of breath and wheezing.   Cardiovascular: Negative.  Negative for chest pain and palpitations.  Gastrointestinal:  Negative for abdominal pain, constipation, diarrhea, heartburn, nausea and vomiting.  Skin: Negative.  Negative for itching and rash.  Neurological:  Negative for dizziness and headaches.  Endo/Heme/Allergies:  Negative for environmental allergies. Does not bruise/bleed easily.      Objective:   Blood pressure 112/84, pulse 84, temperature 97.7 F (36.5 C), resp. rate 18, height '5\' 5"'$  (1.651 m), weight (!) 444 lb 9.6 oz (201.7 kg), SpO2 99 %. Body mass index is 73.99 kg/m.    Physical Exam Vitals reviewed.  Constitutional:      Appearance: She is well-developed. She is obese.  HENT:     Head: Normocephalic and atraumatic.     Right Ear: Tympanic membrane, ear canal and external ear normal.     Left Ear: Tympanic membrane, ear canal and external ear normal.     Nose: No nasal deformity, septal deviation, mucosal edema or rhinorrhea.     Right Turbinates: Enlarged and swollen.     Left Turbinates: Enlarged and swollen.     Right Sinus: No maxillary sinus tenderness or frontal sinus tenderness.     Left Sinus: No maxillary sinus  tenderness or frontal sinus tenderness.     Comments: Nose ring in place.    Mouth/Throat:     Mouth: Mucous membranes are not pale and not dry.     Pharynx: Uvula midline.  Eyes:     General: Lids are normal. Allergic shiner present.        Right eye: No discharge.        Left eye: No discharge.     Conjunctiva/sclera: Conjunctivae normal.     Right eye: Right conjunctiva is not injected. No chemosis.    Left eye: Left conjunctiva is not injected. No chemosis.    Pupils: Pupils are equal, round, and reactive to light.  Cardiovascular:     Rate and Rhythm: Normal rate and regular rhythm.     Heart sounds: Normal heart sounds.  Pulmonary:     Effort: Pulmonary effort is normal. No tachypnea, accessory muscle usage or respiratory distress.     Breath sounds: Normal breath sounds. No wheezing, rhonchi or rales.  Comments: Moving air well in all lung fields, although difficult to hear at the bases. Chest:     Chest wall: No tenderness.  Lymphadenopathy:     Cervical: No cervical adenopathy.  Skin:    Coloration: Skin is not pale.     Findings: No abrasion, erythema, petechiae or rash. Rash is not papular, urticarial or vesicular.  Neurological:     Mental Status: She is alert.  Psychiatric:        Behavior: Behavior is cooperative.     Diagnostic studies:    Spirometry: results normal (FEV1: 2.27/80%, FVC: 3.00/89%, FEV1/FVC: 76%).    Spirometry consistent with normal pattern.   Allergy Studies: none        Salvatore Marvel, MD  [facility]

## 2022-01-13 DIAGNOSIS — I890 Lymphedema, not elsewhere classified: Secondary | ICD-10-CM | POA: Diagnosis not present

## 2022-01-13 DIAGNOSIS — S81002D Unspecified open wound, left knee, subsequent encounter: Secondary | ICD-10-CM | POA: Diagnosis not present

## 2022-01-13 DIAGNOSIS — R7303 Prediabetes: Secondary | ICD-10-CM | POA: Diagnosis not present

## 2022-01-24 NOTE — Progress Notes (Unsigned)
Chief Complaint:   OBESITY Brandy Saunders is here to discuss her progress with her obesity treatment plan along with follow-up of her obesity related diagnoses. Brandy Saunders is on practicing portion control and making smarter food choices, such as increasing vegetables and decreasing simple carbohydrates and states she is following her eating plan approximately 50% of the time. Brandy Saunders states she is doing 0 minutes 0 times per week.  Today's visit was #: 45 Starting weight: 447 lbs Starting date: 10/11/2017 Today's weight: 442 lbs Today's date: 01/09/2022 Total lbs lost to date: 5 Total lbs lost since last in-office visit: 0  Interim History: Caprice continues to work on her weight loss. Her hunger is mostly controlled. She is getting ready to move and her stress is elevated.   Subjective:   1. Hypertension, unspecified type Brandy Saunders's blood pressure is well controlled on her medications. She notes some increase in headaches. She may be dehydrated.   2. Other depression with emotional eating Brandy Saunders notes increased stress and she is trying to minimize her emotional eating behaviors. Her blood pressure is stable.   3. At risk for heart disease Brandy Saunders is at higher than average risk for cardiovascular disease due to obesity.  Assessment/Plan:   1. Hypertension, unspecified type Brandy Saunders is to increase her water intake and some electrolytes, and we will continue to follow.   2. Other depression with emotional eating We will refill Wellbutrin XL for 1 month. Behavior modification techniques were discussed today to help Rickeya deal with her emotional/non-hunger eating behaviors.  Orders and follow up as documented in patient record.   - buPROPion (WELLBUTRIN XL) 300 MG 24 hr tablet; Take 1 tablet (300 mg total) by mouth in the morning.  Dispense: 30 tablet; Refill: 0  3. At risk for heart disease Brandy Saunders was given approximately 15 minutes of coronary artery disease prevention  counseling today. She is 31 y.o. female and has risk factors for heart disease including obesity. We discussed intensive lifestyle modifications today with an emphasis on specific weight loss instructions and strategies.  Repetitive spaced learning was employed today to elicit superior memory formation and behavioral change.   4. Obesity, Current BMI 73.6 Brandy Saunders is currently in the action stage of change. As such, her goal is to continue with weight loss efforts. She has agreed to practicing portion control and making smarter food choices, such as increasing vegetables and decreasing simple carbohydrates.   We discussed various medication options to help Brandy Saunders with her weight loss efforts and we both agreed to continue Wegovy, and we will refill for 1 month.  - Semaglutide-Weight Management (WEGOVY) 1 MG/0.5ML SOAJ; Inject 1 mg into the skin once a week.  Dispense: 2 mL; Refill: 0  Behavioral modification strategies: increasing water intake, increasing high fiber foods, and emotional eating strategies.  Brandy Saunders has agreed to follow-up with our clinic in 4 weeks. She was informed of the importance of frequent follow-up visits to maximize her success with intensive lifestyle modifications for her multiple health conditions.   Objective:   Blood pressure 98/62, pulse 89, temperature 98 F (36.7 C), height '5\' 5"'$  (1.651 m), weight (!) 442 lb (200.5 kg), SpO2 98 %. Body mass index is 73.55 kg/m.  General: Cooperative, alert, well developed, in no acute distress. HEENT: Conjunctivae and lids unremarkable. Cardiovascular: Regular rhythm.  Lungs: Normal work of breathing. Neurologic: No focal deficits.   Lab Results  Component Value Date   CREATININE 0.76 09/15/2021   BUN 13 09/15/2021   NA  140 09/15/2021   K 4.4 09/15/2021   CL 101 09/15/2021   CO2 25 09/15/2021   Lab Results  Component Value Date   ALT 49 (H) 09/15/2021   AST 31 09/15/2021   ALKPHOS 54 09/15/2021   BILITOT  0.5 09/15/2021   Lab Results  Component Value Date   HGBA1C 5.7 (H) 09/15/2021   HGBA1C 5.6 12/03/2019   HGBA1C 5.8 (H) 06/17/2019   HGBA1C 5.8 (H) 05/02/2018   HGBA1C 5.7 (H) 01/17/2018   Lab Results  Component Value Date   INSULIN 43.1 (H) 09/15/2021   INSULIN 30.8 (H) 12/03/2019   INSULIN 35.9 (H) 06/17/2019   INSULIN 30.3 (H) 05/02/2018   INSULIN 39.5 (H) 01/17/2018   Lab Results  Component Value Date   TSH 1.880 09/15/2021   Lab Results  Component Value Date   CHOL 173 09/15/2021   HDL 47 09/15/2021   LDLCALC 102 (H) 09/15/2021   TRIG 133 09/15/2021   CHOLHDL 3 02/26/2020   Lab Results  Component Value Date   VD25OH 65.5 09/15/2021   VD25OH 34.27 02/26/2020   VD25OH 39.8 12/03/2019   Lab Results  Component Value Date   WBC 6.0 05/25/2021   HGB 13.4 05/25/2021   HCT 40.5 05/25/2021   MCV 84.6 05/25/2021   PLT 272 05/25/2021   No results found for: IRON, TIBC, FERRITIN  Attestation Statements:   Reviewed by clinician on day of visit: allergies, medications, problem list, medical history, surgical history, family history, social history, and previous encounter notes.   I, Trixie Dredge, am acting as transcriptionist for Dennard Nip, MD.  I have reviewed the above documentation for accuracy and completeness, and I agree with the above. -  Dennard Nip, MD

## 2022-01-27 DIAGNOSIS — I890 Lymphedema, not elsewhere classified: Secondary | ICD-10-CM | POA: Diagnosis not present

## 2022-01-27 DIAGNOSIS — S81002A Unspecified open wound, left knee, initial encounter: Secondary | ICD-10-CM | POA: Diagnosis not present

## 2022-01-27 DIAGNOSIS — R7303 Prediabetes: Secondary | ICD-10-CM | POA: Diagnosis not present

## 2022-01-30 ENCOUNTER — Other Ambulatory Visit (INDEPENDENT_AMBULATORY_CARE_PROVIDER_SITE_OTHER): Payer: Self-pay | Admitting: Family Medicine

## 2022-01-30 DIAGNOSIS — E559 Vitamin D deficiency, unspecified: Secondary | ICD-10-CM

## 2022-01-31 DIAGNOSIS — L0390 Cellulitis, unspecified: Secondary | ICD-10-CM | POA: Diagnosis not present

## 2022-01-31 DIAGNOSIS — M25519 Pain in unspecified shoulder: Secondary | ICD-10-CM | POA: Diagnosis not present

## 2022-01-31 DIAGNOSIS — R509 Fever, unspecified: Secondary | ICD-10-CM | POA: Diagnosis not present

## 2022-01-31 DIAGNOSIS — Z20822 Contact with and (suspected) exposure to covid-19: Secondary | ICD-10-CM | POA: Diagnosis not present

## 2022-01-31 DIAGNOSIS — R0602 Shortness of breath: Secondary | ICD-10-CM | POA: Diagnosis not present

## 2022-01-31 DIAGNOSIS — L539 Erythematous condition, unspecified: Secondary | ICD-10-CM | POA: Diagnosis not present

## 2022-01-31 DIAGNOSIS — L03116 Cellulitis of left lower limb: Secondary | ICD-10-CM | POA: Diagnosis not present

## 2022-02-01 ENCOUNTER — Encounter (HOSPITAL_COMMUNITY): Payer: Self-pay | Admitting: Emergency Medicine

## 2022-02-01 ENCOUNTER — Other Ambulatory Visit: Payer: Self-pay

## 2022-02-01 ENCOUNTER — Emergency Department (HOSPITAL_COMMUNITY): Payer: BC Managed Care – PPO

## 2022-02-01 ENCOUNTER — Other Ambulatory Visit (INDEPENDENT_AMBULATORY_CARE_PROVIDER_SITE_OTHER): Payer: Self-pay | Admitting: Family Medicine

## 2022-02-01 ENCOUNTER — Inpatient Hospital Stay (HOSPITAL_COMMUNITY)
Admission: EM | Admit: 2022-02-01 | Discharge: 2022-02-06 | DRG: 603 | Disposition: A | Discharge status: Home / Self Care | Payer: BC Managed Care – PPO | Attending: Internal Medicine | Admitting: Internal Medicine

## 2022-02-01 ENCOUNTER — Emergency Department (HOSPITAL_BASED_OUTPATIENT_CLINIC_OR_DEPARTMENT_OTHER): Payer: BC Managed Care – PPO

## 2022-02-01 DIAGNOSIS — L538 Other specified erythematous conditions: Secondary | ICD-10-CM | POA: Diagnosis not present

## 2022-02-01 DIAGNOSIS — Z823 Family history of stroke: Secondary | ICD-10-CM

## 2022-02-01 DIAGNOSIS — Z803 Family history of malignant neoplasm of breast: Secondary | ICD-10-CM | POA: Diagnosis not present

## 2022-02-01 DIAGNOSIS — Z6841 Body Mass Index (BMI) 40.0 and over, adult: Secondary | ICD-10-CM | POA: Diagnosis not present

## 2022-02-01 DIAGNOSIS — I890 Lymphedema, not elsewhere classified: Secondary | ICD-10-CM | POA: Diagnosis present

## 2022-02-01 DIAGNOSIS — K219 Gastro-esophageal reflux disease without esophagitis: Secondary | ICD-10-CM | POA: Diagnosis present

## 2022-02-01 DIAGNOSIS — E559 Vitamin D deficiency, unspecified: Secondary | ICD-10-CM | POA: Diagnosis not present

## 2022-02-01 DIAGNOSIS — F419 Anxiety disorder, unspecified: Secondary | ICD-10-CM | POA: Diagnosis not present

## 2022-02-01 DIAGNOSIS — J45909 Unspecified asthma, uncomplicated: Secondary | ICD-10-CM | POA: Diagnosis present

## 2022-02-01 DIAGNOSIS — R2240 Localized swelling, mass and lump, unspecified lower limb: Secondary | ICD-10-CM | POA: Diagnosis not present

## 2022-02-01 DIAGNOSIS — M7989 Other specified soft tissue disorders: Secondary | ICD-10-CM | POA: Diagnosis not present

## 2022-02-01 DIAGNOSIS — L03116 Cellulitis of left lower limb: Principal | ICD-10-CM | POA: Diagnosis present

## 2022-02-01 DIAGNOSIS — Z8249 Family history of ischemic heart disease and other diseases of the circulatory system: Secondary | ICD-10-CM

## 2022-02-01 DIAGNOSIS — K760 Fatty (change of) liver, not elsewhere classified: Secondary | ICD-10-CM | POA: Diagnosis not present

## 2022-02-01 DIAGNOSIS — I10 Essential (primary) hypertension: Secondary | ICD-10-CM | POA: Diagnosis present

## 2022-02-01 DIAGNOSIS — M79605 Pain in left leg: Secondary | ICD-10-CM

## 2022-02-01 DIAGNOSIS — R600 Localized edema: Secondary | ICD-10-CM | POA: Diagnosis not present

## 2022-02-01 DIAGNOSIS — F32A Depression, unspecified: Secondary | ICD-10-CM | POA: Diagnosis not present

## 2022-02-01 DIAGNOSIS — R519 Headache, unspecified: Secondary | ICD-10-CM | POA: Clinically undetermined

## 2022-02-01 DIAGNOSIS — N179 Acute kidney failure, unspecified: Secondary | ICD-10-CM | POA: Diagnosis present

## 2022-02-01 DIAGNOSIS — Z811 Family history of alcohol abuse and dependence: Secondary | ICD-10-CM

## 2022-02-01 DIAGNOSIS — Z833 Family history of diabetes mellitus: Secondary | ICD-10-CM | POA: Diagnosis not present

## 2022-02-01 DIAGNOSIS — R7303 Prediabetes: Secondary | ICD-10-CM | POA: Diagnosis present

## 2022-02-01 DIAGNOSIS — L03119 Cellulitis of unspecified part of limb: Principal | ICD-10-CM

## 2022-02-01 DIAGNOSIS — F3289 Other specified depressive episodes: Secondary | ICD-10-CM

## 2022-02-01 LAB — BASIC METABOLIC PANEL
Anion gap: 11 (ref 5–15)
BUN: 14 mg/dL (ref 6–20)
CO2: 21 mmol/L — ABNORMAL LOW (ref 22–32)
Calcium: 9.1 mg/dL (ref 8.9–10.3)
Chloride: 102 mmol/L (ref 98–111)
Creatinine, Ser: 0.99 mg/dL (ref 0.44–1.00)
GFR, Estimated: >60 mL/min (ref >60)
Glucose, Bld: 134 mg/dL — ABNORMAL HIGH (ref 70–99)
Potassium: 3.8 mmol/L (ref 3.5–5.1)
Sodium: 134 mmol/L — ABNORMAL LOW (ref 135–145)

## 2022-02-01 LAB — CBC WITH DIFFERENTIAL/PLATELET
Abs Immature Granulocytes: 0 10*3/uL (ref 0.00–0.07)
Basophils Absolute: 0 10*3/uL (ref 0.0–0.1)
Basophils Relative: 0 %
Eosinophils Absolute: 0 10*3/uL (ref 0.0–0.5)
Eosinophils Relative: 0 %
HCT: 40.7 % (ref 36.0–46.0)
Hemoglobin: 13.7 g/dL (ref 12.0–15.0)
Lymphocytes Relative: 10 %
Lymphs Abs: 1.7 10*3/uL (ref 0.7–4.0)
MCH: 27.7 pg (ref 26.0–34.0)
MCHC: 33.7 g/dL (ref 30.0–36.0)
MCV: 82.4 fL (ref 80.0–100.0)
Monocytes Absolute: 0.7 10*3/uL (ref 0.1–1.0)
Monocytes Relative: 4 %
Neutro Abs: 15 10*3/uL — ABNORMAL HIGH (ref 1.7–7.7)
Neutrophils Relative %: 86 %
Platelets: 233 10*3/uL (ref 150–400)
RBC: 4.94 MIL/uL (ref 3.87–5.11)
RDW: 13.8 % (ref 11.5–15.5)
WBC: 17.4 10*3/uL — ABNORMAL HIGH (ref 4.0–10.5)
nRBC: 0 % (ref 0.0–0.2)
nRBC: 0 /100 WBC

## 2022-02-01 LAB — I-STAT BETA HCG BLOOD, ED (MC, WL, AP ONLY): I-stat hCG, quantitative: <5 m[IU]/mL (ref <5)

## 2022-02-01 LAB — LACTIC ACID, PLASMA: Lactic Acid, Venous: 0.9 mmol/L (ref 0.5–1.9)

## 2022-02-01 MED ORDER — SENNOSIDES-DOCUSATE SODIUM 8.6-50 MG PO TABS
1.0000 | ORAL_TABLET | Freq: Every evening | ORAL | Status: DC | PRN
Start: 2022-02-01 — End: 2022-02-07

## 2022-02-01 MED ORDER — VANCOMYCIN HCL 1250 MG/250ML IV SOLN
1250.0000 mg | Freq: Three times a day (TID) | INTRAVENOUS | Status: DC
Start: 2022-02-02 — End: 2022-02-02
  Administered 2022-02-02: 1250 mg via INTRAVENOUS
  Filled 2022-02-01 (×2): qty 250

## 2022-02-01 MED ORDER — ONDANSETRON 4 MG PO TBDP: 4.0000 mg | ORAL_TABLET | Freq: Once | ORAL | Status: DC

## 2022-02-01 MED ORDER — BUPROPION HCL ER (XL) 300 MG PO TB24
300.0000 mg | ORAL_TABLET | Freq: Every morning | ORAL | Status: DC
  Filled 2022-02-01: qty 1

## 2022-02-01 MED ORDER — METOPROLOL SUCCINATE ER 50 MG PO TB24
100.0000 mg | ORAL_TABLET | Freq: Every day | ORAL | Status: DC
  Administered 2022-02-02: 100 mg via ORAL
  Filled 2022-02-01: qty 1

## 2022-02-01 MED ORDER — SODIUM CHLORIDE 0.9 % IV SOLN
2.0000 g | INTRAVENOUS | Status: DC
  Administered 2022-02-02 – 2022-02-03 (×3): 2 g via INTRAVENOUS
  Filled 2022-02-01 (×3): qty 20

## 2022-02-01 MED ORDER — ACETAMINOPHEN 500 MG PO TABS
1000.0000 mg | ORAL_TABLET | Freq: Four times a day (QID) | ORAL | Status: DC | PRN
  Administered 2022-02-02 – 2022-02-03 (×4): 1000 mg via ORAL
  Filled 2022-02-01 (×4): qty 2

## 2022-02-01 MED ORDER — ONDANSETRON HCL 4 MG/2ML IJ SOLN
4.0000 mg | Freq: Once | INTRAMUSCULAR | Status: AC
Start: 2022-02-01 — End: 2022-02-01
  Administered 2022-02-01: 4 mg via INTRAVENOUS

## 2022-02-01 MED ORDER — ONDANSETRON HCL 4 MG/2ML IJ SOLN
4.0000 mg | Freq: Once | INTRAMUSCULAR | Status: DC
  Filled 2022-02-01: qty 2

## 2022-02-01 MED ORDER — CHLORTHALIDONE 25 MG PO TABS
12.5000 mg | ORAL_TABLET | Freq: Every day | ORAL | Status: DC
  Administered 2022-02-02: 12.5 mg via ORAL
  Filled 2022-02-01 (×2): qty 0.5

## 2022-02-01 MED ORDER — ACETAMINOPHEN 650 MG RE SUPP: 650.0000 mg | Freq: Four times a day (QID) | RECTAL | Status: DC | PRN

## 2022-02-01 MED ORDER — KETOROLAC TROMETHAMINE 15 MG/ML IJ SOLN
15.0000 mg | Freq: Once | INTRAMUSCULAR | Status: AC
  Administered 2022-02-01: 15 mg via INTRAVENOUS
  Filled 2022-02-01: qty 1

## 2022-02-01 MED ORDER — ONDANSETRON HCL 4 MG PO TABS: 4.0000 mg | ORAL_TABLET | Freq: Four times a day (QID) | ORAL | Status: DC | PRN

## 2022-02-01 MED ORDER — ENOXAPARIN SODIUM 100 MG/ML IJ SOSY
100.0000 mg | PREFILLED_SYRINGE | INTRAMUSCULAR | Status: DC
  Administered 2022-02-02 – 2022-02-06 (×5): 100 mg via SUBCUTANEOUS
  Filled 2022-02-01 (×7): qty 1

## 2022-02-01 MED ORDER — ONDANSETRON HCL 4 MG/2ML IJ SOLN: 4.0000 mg | Freq: Four times a day (QID) | INTRAMUSCULAR | Status: DC | PRN

## 2022-02-01 MED ORDER — ALBUTEROL SULFATE (2.5 MG/3ML) 0.083% IN NEBU: 2.5000 mg | INHALATION_SOLUTION | RESPIRATORY_TRACT | Status: DC | PRN

## 2022-02-01 MED ORDER — SODIUM CHLORIDE 0.9 % IV SOLN
2500.0000 mg | Freq: Once | INTRAVENOUS | Status: AC
  Administered 2022-02-01: 2500 mg via INTRAVENOUS
  Filled 2022-02-01: qty 2500

## 2022-02-01 MED ORDER — VANCOMYCIN HCL 1500 MG/300ML IV SOLN
1500.0000 mg | Freq: Three times a day (TID) | INTRAVENOUS | Status: DC
  Filled 2022-02-01: qty 300

## 2022-02-01 NOTE — Progress Notes (Signed)
LLE venous duplex has been completed. Preliminary results given to [facility], PA-C.   Results can be found under chart review under CV PROC. 02/01/2022 5:54 PM Annaleigh Steinmeyer RVT, RDMS

## 2022-02-01 NOTE — Hospital Course (Signed)
Brandy Saunders is a 31 y.o. female with medical history significant for asthma, HTN, depression/anxiety, morbid obesity who is admitted with left lower extremity cellulitis.

## 2022-02-01 NOTE — Assessment & Plan Note (Addendum)
Left lower extremity cellulitis failing outpatient antibiotics with leukocytosis and fever.  Has been seeing wound care for left knee wound which appears to be healing well. -Noted to have a temp of 100.6 the night of 02/01/2022.  -Patient patient remained afebrile throughout the hospitalization -Leukocytosis trending down during hospitalization. -Plain films which were done were negative for any abscess formation. -Lower extremity Dopplers negative for DVT. -Status post Lasix 20 mg IV x1 02/03/2022 -Still with significant swelling in lower extremity erythema and some tenderness to palpation on 02/04/2022 and as such ID consulted..  --Due to no significant improvement on 02/04/2022 CT of the tib-fib and foot were done which was negative for any abscess formation or osteomyelitis.  -Patient seen in consultation by ID and IV antibiotics narrowed to IV cefazolin and clindamycin added for toxin inhibition. -IV vancomycin subsequently discontinued and patient maintained on IV cefazolin and oral clindamycin.   -Patient received a dose of IV Lasix during the hospitalization.  -ID recommended 7-day course of Zyvox in addition to 3 days of Lasix which patient will be discharged home on.   -Patient to remain off her feet for about 5 to 7 days per ID recommendations.   -Outpatient follow-up with PCP.

## 2022-02-01 NOTE — ED Triage Notes (Signed)
Pt reports left leg pain, dx with cellulitis yesterday.

## 2022-02-01 NOTE — ED Provider Notes (Signed)
[facility] EMERGENCY DEPARTMENT Provider Note  CSN: [id number] Arrival date & time: 02/01/22 1532  Chief Complaint(s) Leg Pain  HPI Brandy Saunders is a 31 y.o. female with PMH asthma, fatty liver, obesity, chronic lower extremity edema who presents emergency department for evaluation of left leg pain and swelling.  Patient states that 2 days ago she was diagnosed with left lower extremity cellulitis at the proximal tibia.  She states that that area has improved but today she has a new worsening area of erythema, swelling and tenderness just distal to this near the ankle on the left.  She endorses B symptoms such as nausea, chills, and generalized malaise.  Denies numbness, tingling, weakness of the lower extremity.  Patient states she is concerned about a DVT.   Past Medical History Past Medical History:  Diagnosis Date   Alcohol abuse    Anxiety    Asthma    Back pain    Binge eating    Carpal tunnel syndrome, bilateral    Chest pain    Common migraine with intractable migraine 02/02/2017   Constipation    Depression    Dyspnea    Eczema    Fatty liver    GERD (gastroesophageal reflux disease)    Hypertension    Joint pain    Leg edema    Morbidly obese (HCC)    Palpitations    PCOS (polycystic ovarian syndrome)    Prediabetes    Sleep apnea    Urticaria    Vaginal Pap smear, abnormal    Vitamin D deficiency    Patient Active Problem List   Diagnosis Date Noted   Hypertension 01/09/2022   Sciatica of left side 02/18/2021   Allergies 07/26/2020   Lymphedema 05/05/2020   Vitamin D deficiency 01/22/2020   Rash 12/16/2019   Nevus 12/16/2019   Depression 10/19/2019   Hyperlipidemia 09/18/2019   Spasm of thoracic back muscle 09/18/2019   Palpitations 08/30/2018   Obstructive sleep apnea 08/30/2018   Pain of left calf 12/14/2017   Other fatigue 10/11/2017   Shortness of breath on exertion 10/11/2017   Prediabetes 10/11/2017   Chronic left-sided  thoracic back pain 05/23/2017   Acute left-sided thoracic back pain 05/09/2017   Migraine 05/09/2017   Essential hypertension 05/09/2017   Common migraine with intractable migraine 02/02/2017   Chronic fatigue 01/06/2016   Fatty liver 02/15/2015   Esophageal reflux 02/15/2015   Late menses 07/30/2014   Class 3 severe obesity with serious comorbidity and body mass index (BMI) greater than or equal to 70 in adult ([place]) 07/30/2014   Asthma without acute exacerbation 07/30/2014   IBS (irritable bowel syndrome) 07/30/2014   Elevated BP 07/30/2014   PCOS (polycystic ovarian syndrome) 11/25/2012   Carpal tunnel syndrome, bilateral    Home Medication(s) Prior to Admission medications   Medication Sig Start Date End Date Taking? Authorizing Provider  acetaminophen (TYLENOL) 500 MG tablet Take 500 mg by mouth every 6 (six) hours as needed.    [provider]  albuterol (VENTOLIN HFA) 108 (90 Base) MCG/ACT inhaler Inhale 2 puffs into the lungs every 4 (four) hours as needed for wheezing or shortness of breath. 01/12/22   Valentina Shaggy, MD  Ascorbic Acid (VITAMIN C) 1000 MG tablet Take 1,000 mg by mouth daily.    [provider]  BIOTIN 5000 PO Take 10,000 mg by mouth daily.     [provider]  buPROPion (WELLBUTRIN XL) 300 MG 24 hr tablet  Take 1 tablet (300 mg total) by mouth in the morning. 01/09/22   Dennard Nip D, MD  cetirizine (ZYRTEC) 10 MG tablet Take 1 tablet (10 mg total) by mouth daily. 10/26/20   Valentina Shaggy, MD  chlorthalidone (HYGROTON) 25 MG tablet TAKE 1/2 TABLET BY MOUTH EVERY DAY 11/08/21   Dennard Nip D, MD  Cholecalciferol (VITAMIN D) 50 MCG (2000 UT) CAPS Take 1 capsule (2,000 Units total) by mouth daily. 09/15/21   Dennard Nip D, MD  ciclesonide (ALVESCO) 160 MCG/ACT inhaler Inhale 1 puff into the lungs 2 (two) times daily. 11/17/20   Dara Hoyer, FNP  Cyanocobalamin (VITAMIN B 12 PO) Take 2,500 mcg by mouth daily.    [provider]  medroxyPROGESTERone (PROVERA) 10 MG tablet Take 10 mg by mouth as directed. 01/02/22   [provider]  methocarbamol (ROBAXIN) 500 MG tablet Take 1 tablet (500 mg total) by mouth every 8 (eight) hours as needed for muscle spasms. 02/18/21   Libby Maw, MD  metoprolol succinate (TOPROL-XL) 100 MG 24 hr tablet Take 1 tablet (100 mg total) by mouth daily. Take with or immediately following a meal. 11/08/21   Dennard Nip D, MD  Multiple Vitamins-Calcium (ONE-A-DAY WOMENS PO) Take 1 tablet by mouth daily.     [provider]  polyethylene glycol (MIRALAX / GLYCOLAX) 17 g packet Take 17 g by mouth daily as needed (constipation).    [provider]  Semaglutide-Weight Management (WEGOVY) 1 MG/0.5ML SOAJ Inject 1 mg into the skin once a week. 01/09/22   Dennard Nip D, MD  triamcinolone ointment (KENALOG) 0.1 % Apply thin layer to affected areas 2-3 times daily during flares. 08/10/20   Kozlow, Donnamarie Poag, MD  Vitamin D, Ergocalciferol, (DRISDOL) 1.25 MG (50000 UNIT) CAPS capsule Take one po weekly 12/08/21   Starlyn Skeans, MD                                                                                                                                    Past Surgical History Past Surgical History:  Procedure Laterality Date   ADENOIDECTOMY     TONSILECTOMY, ADENOIDECTOMY, BILATERAL MYRINGOTOMY AND TUBES  1994   Family History Family History  Problem Relation Age of Onset   Hypertension Mother    Hyperlipidemia Mother    Obesity Mother    Heart disease Mother    Stroke Father    Obesity Father    Alcoholism Father    Drug abuse Father    Sudden death Father    Diabetes Maternal Uncle    Heart disease Maternal Grandmother    Breast cancer Maternal Aunt    Colon cancer Neg Hx     Social History Social History   Tobacco Use   Smoking status: Never   Smokeless tobacco: Never  Vaping Use   Vaping Use: Never used  Substance Use Topics  Alcohol use: Yes    Alcohol/week: 3.0 standard drinks    Types: 3 Glasses of wine per week   Drug use: No    Types: Marijuana    Comment: once every 6 months, taking CBD   Allergies Ibuprofen and Meloxicam  Review of Systems Review of Systems  Constitutional:  Positive for chills and fever.  Skin:  Positive for rash.   Physical Exam Vital Signs  I have reviewed the triage vital signs BP 117/70 (BP Location: Right Arm)   Pulse 100   Temp (!) 100.6 F (38.1 C) (Oral)   Resp 16   SpO2 97%   Physical Exam Vitals and nursing note reviewed.  Constitutional:      General: She is not in acute distress.    Appearance: She is well-developed.  HENT:     Head: Normocephalic and atraumatic.  Eyes:     Conjunctiva/sclera: Conjunctivae normal.  Cardiovascular:     Rate and Rhythm: Normal rate and regular rhythm.     Heart sounds: No murmur heard. Pulmonary:     Effort: Pulmonary effort is normal. No respiratory distress.     Breath sounds: Normal breath sounds.  Abdominal:     Palpations: Abdomen is soft.     Tenderness: There is no abdominal tenderness.  Musculoskeletal:        General: No swelling.     Cervical back: Neck supple.  Skin:    General: Skin is warm and dry.     Capillary Refill: Capillary refill takes less than 2 seconds.     Findings: Rash (5 cm area of erythema to the distal left tibia, tender to palpation) present.  Neurological:     Mental Status: She is alert.  Psychiatric:        Mood and Affect: Mood normal.    ED Results and Treatments Labs (all labs ordered are listed, but only abnormal results are displayed) Labs Reviewed  CBC WITH DIFFERENTIAL/PLATELET - Abnormal; Notable for the following components:      Result Value   WBC 17.4 (*)    All other components within normal limits  BASIC METABOLIC PANEL - Abnormal; Notable for the following components:   Sodium 134 (*)    CO2 21 (*)    Glucose, Bld 134 (*)    All other components within  normal limits  CULTURE, BLOOD (ROUTINE X 2)  CULTURE, BLOOD (ROUTINE X 2)  LACTIC ACID, PLASMA  LACTIC ACID, PLASMA  I-STAT BETA HCG BLOOD, ED ([facility], [facility], [facility] ONLY)                                                                                                                          Radiology DG Tibia/Fibula Left  Result Date: 02/01/2022 CLINICAL DATA:  Rule out soft tissue gas. EXAM: LEFT TIBIA AND FIBULA - 2 VIEW COMPARISON:  Left hip radiograph dated 10/08/2019. FINDINGS: There is no acute fracture or dislocation. The bones are well mineralized. No significant  arthritic changes. Mild diffuse subcutaneous edema. No radiopaque foreign object or soft tissue gas. IMPRESSION: Mild diffuse subcutaneous edema. No soft tissue gas. Electronically Signed   By: Anner Crete M.D.   On: 02/01/2022 20:54   VAS [place] LOWER EXTREMITY VENOUS (DVT) (7a-7p)  Result Date: 02/01/2022  Lower Venous DVT Study Patient Name:  Brandy Saunders  Date of Exam:   02/01/2022 Medical Rec #: [id number]          Accession #:    [id number] Date of Birth: 09/23/1990           Patient Gender: F Patient Age:   60 years Exam Location:  [facility] Procedure:      VAS [place] LOWER EXTREMITY VENOUS (DVT) Referring Phys: HALEY SAGE --------------------------------------------------------------------------------  Indications: Pain & Erythema - patient started on antibiotics yesterday for LLE cellulitis.  Limitations: Body habitus and poor ultrasound/tissue interface. Comparison Study: Previous LLEV exam on 12/13/17 was negative for DVT. Performing Technologist: Rogelia Rohrer RVT, RDMS  Examination Guidelines: A complete evaluation includes B-mode imaging, spectral Doppler, color Doppler, and power Doppler as needed of all accessible portions of each vessel. Bilateral testing is considered an integral part of a complete examination. Limited examinations for reoccurring indications may be performed as noted. The reflux portion of the exam is  performed with the patient in reverse Trendelenburg.  +-----+---------------+---------+-----------+----------+--------------+ RIGHTCompressibilityPhasicitySpontaneityPropertiesThrombus Aging +-----+---------------+---------+-----------+----------+--------------+ CFV  Full           Yes      Yes                                 +-----+---------------+---------+-----------+----------+--------------+   +---------+---------------+---------+-----------+----------+--------------+ LEFT     CompressibilityPhasicitySpontaneityPropertiesThrombus Aging +---------+---------------+---------+-----------+----------+--------------+ CFV      Full           Yes      Yes                                 +---------+---------------+---------+-----------+----------+--------------+ SFJ      Full                                                        +---------+---------------+---------+-----------+----------+--------------+ FV Prox  Full           Yes      Yes                                 +---------+---------------+---------+-----------+----------+--------------+ FV Mid   Full           Yes      Yes                                 +---------+---------------+---------+-----------+----------+--------------+ FV DistalFull           Yes      Yes                                 +---------+---------------+---------+-----------+----------+--------------+ PFV      Full                                                        +---------+---------------+---------+-----------+----------+--------------+  POP      Full           Yes      Yes                                 +---------+---------------+---------+-----------+----------+--------------+ PTV      Full                                                        +---------+---------------+---------+-----------+----------+--------------+ PERO     Full                                                         +---------+---------------+---------+-----------+----------+--------------+     Summary: RIGHT: - No evidence of common femoral vein obstruction.  LEFT: - There is no evidence of deep vein thrombosis in the lower extremity.  - No cystic structure found in the popliteal fossa.  *See table(s) above for measurements and observations.    Preliminary     Pertinent labs & imaging results that were available during my care of the patient were reviewed by me and considered in my medical decision making (see MDM for details).  Medications Ordered in ED Medications  vancomycin (VANCOCIN) 2,500 mg in sodium chloride 0.9 % 500 mL IVPB (has no administration in time range)  ondansetron (ZOFRAN) injection 4 mg (has no administration in time range)  vancomycin (VANCOREADY) IVPB 1500 mg/300 mL (has no administration in time range)                                                                                                                                     Procedures .Critical Care Performed by: Teressa Lower, MD Authorized by: Teressa Lower, MD   Critical care provider statement:    Critical care time (minutes):  30   Critical care was necessary to treat or prevent imminent or life-threatening deterioration of the following conditions:  Sepsis   Critical care was time spent personally by me on the following activities:  Development of treatment plan with patient or surrogate, discussions with consultants, evaluation of patient's response to treatment, examination of patient, ordering and review of laboratory studies, ordering and review of radiographic studies, ordering and performing treatments and interventions, pulse oximetry, re-evaluation of patient's condition and review of old charts  (including critical care time)  Medical Decision Making / ED Course   This patient presents to the ED for concern of leg pain, rash, this involves an extensive number of treatment options, and is a complaint  that carries with it a high risk of complications and morbidity.  The differential diagnosis includes cellulitis, failure of outpatient antibiotics, necrotizing fasciitis, DVT  MDM: The emergency room for evaluation of leg pain and rash.  Physical exam reveals a large patch of tender erythema to the distal left leg, circumferential.  No Crepitus.  DVT laboratory evaluation negative for DVT.  X-ray with no evidence of soft tissue gas.  Laboratory evaluation with a significant leukocytosis to 74.0 with neutrophilic predominance, sodium 134, but otherwise unremarkable.  I had a long discussion with the patient about potential use of dalbavancin versus observation admission IV vancomycin.  Patient elected to stay in the hospital for IV vancomycin and thus this course of treatment was started.  Patient then admitted to the hospitalist service for failure of outpatient antibiotics in setting of cellulitis and possible bacteremia.   Additional history obtained:  -External records from outside source obtained and reviewed including: Chart review including previous notes, labs, imaging, consultation notes   Lab Tests: -I ordered, reviewed, and interpreted labs.   The pertinent results include:   Labs Reviewed  CBC WITH DIFFERENTIAL/PLATELET - Abnormal; Notable for the following components:      Result Value   WBC 17.4 (*)    All other components within normal limits  BASIC METABOLIC PANEL - Abnormal; Notable for the following components:   Sodium 134 (*)    CO2 21 (*)    Glucose, Bld 134 (*)    All other components within normal limits  CULTURE, BLOOD (ROUTINE X 2)  CULTURE, BLOOD (ROUTINE X 2)  LACTIC ACID, PLASMA  LACTIC ACID, PLASMA  I-STAT BETA HCG BLOOD, ED ([facility], [facility], [facility] ONLY)        Imaging Studies ordered: I ordered imaging studies including DVT ultrasound, XR tib-fib I independently visualized and interpreted imaging. I agree with the radiologist interpretation   Medicines  ordered and prescription drug management: Meds ordered this encounter  Medications   vancomycin (VANCOCIN) 2,500 mg in sodium chloride 0.9 % 500 mL IVPB    Order Specific Question:   Indication:    Answer:   Cellulitis   ondansetron (ZOFRAN) injection 4 mg   vancomycin (VANCOREADY) IVPB 1500 mg/300 mL    Order Specific Question:   Indication:    Answer:   Cellulitis    -I have reviewed the patients home medicines and have made adjustments as needed  Critical interventions Antibiotics, fluid resuscitation    Cardiac Monitoring: The patient was maintained on a cardiac monitor.  I personally viewed and interpreted the cardiac monitored which showed an underlying rhythm of: Sinus tachycardia  Social Determinants of Health:  Factors impacting patients care include: none   Reevaluation: After the interventions noted above, I reevaluated the patient and found that they have :improved  Co morbidities that complicate the patient evaluation  Past Medical History:  Diagnosis Date   Alcohol abuse    Anxiety    Asthma    Back pain    Binge eating    Carpal tunnel syndrome, bilateral    Chest pain    Common migraine with intractable migraine 02/02/2017   Constipation    Depression    Dyspnea    Eczema    Fatty liver    GERD (gastroesophageal reflux disease)    Hypertension    Joint pain    Leg edema    Morbidly obese (HCC)    Palpitations    PCOS (polycystic ovarian syndrome)    Prediabetes  Sleep apnea    Urticaria    Vaginal Pap smear, abnormal    Vitamin D deficiency       Dispostion: I considered admission for this patient, and given failure of outpatient antibiotics and 3/4 SIRS criteria, patient will require hospital admission for IV antibiotics     Final Clinical Impression(s) / ED Diagnoses Final diagnoses:  None     '@PCDICTATION'$ @    Teressa Lower, MD 02/01/22 2326

## 2022-02-01 NOTE — H&P (Signed)
History and Physical    Brandy Saunders FGH:[id number] DOB: Jul 14, 1991 DOA: 02/01/2022  PCP: Brandy Saunders.  Patient coming from: Home  I have personally briefly reviewed patient's old medical records in [place]  Chief Complaint: Left lower extremity cellulitis  HPI: Brandy Saunders is a 31 y.o. female with medical history significant for asthma, HTN, depression/anxiety, morbid obesity who presented to the ED for evaluation of left lower extremity cellulitis.  Patient has been following with wound care for left knee open wound which initially occurred 11/16/2021 after a fall.  This has been healing well with topical treatments and intermittent debridement.  She has not seen any further discharge from the area or swelling at her knee.  On 6/6 she noticed new erythema at the proximal tibial area and distal LLE above the ankle.  She went to the ED and was prescribed doxycycline for which she has taken 1 days worth so far.  She has been having fevers up to 104 F at home, chills, diaphoresis, nausea, and malaise.  Due to persistent symptoms she came to the ED for further evaluation.  ED Course  Labs/Imaging on admission: I have personally reviewed following labs and imaging studies.  Initial vitals showed BP 118/64, pulse 108, RR 20, temp 99.0 F, SPO2 97% on room air.  Tmax 100.6 F.  Labs show WBC 17.4, hemoglobin 13.7, platelets 233,000, sodium 134, potassium 3.8, bicarb 21, BUN 14, creatinine 0.99, serum glucose 134, i-STAT beta-hCG <5.0.  Blood cultures ordered and pending.  Left tibia/fibula x-ray shows mild diffuse subcutaneous edema without soft tissue gas.  LLE venous ultrasound negative for evidence of DVT.  Patient was given IV vancomycin and Toradol.  The hospitalist service was consulted to admit for further evaluation and management.  Review of Systems: All systems reviewed and are negative except as documented in history of present illness  above.   Past Medical History:  Diagnosis Date   Alcohol abuse    Anxiety    Asthma    Back pain    Binge eating    Carpal tunnel syndrome, bilateral    Chest pain    Common migraine with intractable migraine 02/02/2017   Constipation    Depression    Dyspnea    Eczema    Fatty liver    GERD (gastroesophageal reflux disease)    Hypertension    Joint pain    Leg edema    Morbidly obese (HCC)    Palpitations    PCOS (polycystic ovarian syndrome)    Prediabetes    Sleep apnea    Urticaria    Vaginal Pap smear, abnormal    Vitamin D deficiency     Past Surgical History:  Procedure Laterality Date   ADENOIDECTOMY     TONSILECTOMY, ADENOIDECTOMY, BILATERAL MYRINGOTOMY AND TUBES  1994    Social History:  reports that she has never smoked. She has never used smokeless tobacco. She reports current alcohol use of about 3.0 standard drinks per week. She reports that she does not use drugs.  Allergies  Allergen Reactions   Ibuprofen Diarrhea   Meloxicam Other (See Comments)    Pt stated, "It made my emotions out of wack; it made my left hand numb" Pt stated, "It made my emotions out of wack; it made my left hand numb" Pt stated, "It made my emotions out of wack; it made my left hand numb" Pt stated, "It made my emotions out of wack; it made my  left hand numb"     Family History  Problem Relation Age of Onset   Hypertension Mother    Hyperlipidemia Mother    Obesity Mother    Heart disease Mother    Stroke Father    Obesity Father    Alcoholism Father    Drug abuse Father    Sudden death Father    Diabetes Maternal Uncle    Heart disease Maternal Grandmother    Breast cancer Maternal Aunt    Colon cancer Neg Hx      Prior to Admission medications   Medication Sig Start Date End Date Taking? Authorizing Provider  acetaminophen (TYLENOL) 500 MG tablet Take 1,000 mg by mouth every 6 (six) hours as needed for moderate pain or headache.   Yes [provider]  albuterol (VENTOLIN HFA) 108 (90 Base) MCG/ACT inhaler Inhale 2 puffs into the lungs every 4 (four) hours as needed for wheezing or shortness of breath. 01/12/22  Yes Brandy Shaggy, MD  Ascorbic Acid (VITAMIN C) 1000 MG tablet Take 1,000 mg by mouth daily.   Yes [provider]  BIOTIN 5000 PO Take 10,000 mg by mouth daily.    Yes [provider]  buPROPion (WELLBUTRIN XL) 300 MG 24 hr tablet Take 1 tablet (300 mg total) by mouth in the morning. 01/09/22  Yes Saunders, Brandy D, MD  chlorthalidone (HYGROTON) 25 MG tablet TAKE 1/2 TABLET BY MOUTH EVERY DAY Patient taking differently: Take 12.5 mg by mouth daily. 11/08/21  Yes Saunders, Brandy D, MD  Cholecalciferol (VITAMIN D) 50 MCG (2000 UT) CAPS Take 1 capsule (2,000 Units total) by mouth daily. 09/15/21  Yes Saunders, Brandy D, MD  Cyanocobalamin (VITAMIN B 12 PO) Take 2,500 mcg by mouth daily.   Yes [provider]  doxycycline (VIBRAMYCIN) 100 MG capsule Take 100 mg by mouth See admin instructions. Bid x 10 days 01/31/22  Yes [provider]  medroxyPROGESTERone (PROVERA) 10 MG tablet Take 10 mg by mouth See admin instructions. Every 3 months to bring on menstrual cycle 01/02/22  Yes [provider]  metoprolol succinate (TOPROL-XL) 100 MG 24 hr tablet Take 1 tablet (100 mg total) by mouth daily. Take with or immediately following a meal. 11/08/21  Yes Brandy Saunders, Brandy D, MD  Multiple Vitamins-Calcium (ONE-A-DAY WOMENS PO) Take 1 tablet by mouth daily.    Yes [provider]  polyethylene glycol (MIRALAX / GLYCOLAX) 17 g packet Take 17 g by mouth daily as needed (constipation).   Yes [provider]  Semaglutide-Weight Management (WEGOVY) 1 MG/0.5ML SOAJ Inject 1 mg into the skin once a week. Patient taking differently: Inject 1 mg into the skin every Wednesday. 01/09/22  Yes Saunders, Brandy D, MD  Vitamin D, Ergocalciferol, (DRISDOL) 1.25 MG (50000 UNIT) CAPS capsule Take one po  weekly Patient taking differently: Take 50,000 Units by mouth every Sunday. 12/08/21  Yes Saunders, Brandy D, MD  cetirizine (ZYRTEC) 10 MG tablet Take 1 tablet (10 mg total) by mouth daily. Patient not taking: Reported on 02/01/2022 10/26/20   Brandy Shaggy, MD  ciclesonide (ALVESCO) 160 MCG/ACT inhaler Inhale 1 puff into the lungs 2 (two) times daily. Patient not taking: Reported on 02/01/2022 11/17/20   Dara Hoyer, FNP  methocarbamol (ROBAXIN) 500 MG tablet Take 1 tablet (500 mg total) by mouth every 8 (eight) hours as needed for muscle spasms. Patient not taking: Reported on 02/01/2022 02/18/21   Libby Maw, MD  triamcinolone ointment (KENALOG) 0.1 %  Apply thin layer to affected areas 2-3 times daily during flares. Patient not taking: Reported on 02/01/2022 08/10/20   Jiles Prows, MD    Physical Exam: Vitals:   02/01/22 1541 02/01/22 2013  BP: 118/64 117/70  Pulse: (!) 108 100  Resp: 20 16  Temp: 99 F (37.2 C) (!) 100.6 F (38.1 C)  TempSrc:  Oral  SpO2: 97% 97%   Constitutional: Sitting up in chair, NAD, calm, comfortable Eyes: EOMI, lids and conjunctivae normal ENMT: Mucous membranes are moist. Posterior pharynx clear of any exudate or lesions.Normal dentition.  Neck: normal, supple, no masses. Respiratory: clear to auscultation bilaterally, no wheezing, no crackles. Normal respiratory effort. No accessory muscle use.  Cardiovascular: Regular rate and rhythm, no murmurs / rubs / gallops. No extremity edema. Abdomen: no tenderness, no masses palpated.  Musculoskeletal: no clubbing / cyanosis. No joint deformity upper and lower extremities. Good ROM, no contractures. Normal muscle tone.  Skin: Erythema and increased warmth to touch below the left knee and distally above the left ankle.  Left knee wound/ulcer appears to be healing well without open wound or drainage. Neurologic: Sensation intact. Strength 5/5 in all 4.  Psychiatric: Normal judgment and insight. Alert  and oriented x 3. Normal mood.       EKG: Not performed.  Assessment/Plan Principal Problem:   Cellulitis of left lower extremity Active Problems:   Class 3 severe obesity with serious comorbidity and body mass index (BMI) greater than or equal to 70 in adult [facility])   Asthma without acute exacerbation   Essential hypertension   Bethel L Sneed is a 31 y.o. female with medical history significant for asthma, HTN, depression/anxiety, morbid obesity who is admitted with left lower extremity cellulitis.  Assessment and Plan: * Cellulitis of left lower extremity Left lower extremity cellulitis failing outpatient antibiotics with leukocytosis and fever.  Has been seeing wound care for left knee wound which appears to be healing well. -Continue IV vancomycin and ceftriaxone -Follow blood cultures  Essential hypertension Continue home chlorthalidone and Toprol-XL.  Asthma without acute exacerbation Continue albuterol as needed.  DVT prophylaxis:  Code Status: Full code, confirmed on admission Family Communication: Discussed with patient, she has discussed with family Disposition Plan: From home and likely discharge to home pending clinical progress Consults called: None Severity of Illness: The appropriate patient status for this patient is OBSERVATION. Observation status is judged to be reasonable and necessary in order to provide the required intensity of service to ensure the patient's safety. The patient's presenting symptoms, physical exam findings, and initial radiographic and laboratory data in the context of their medical condition is felt to place them at decreased risk for further clinical deterioration. Furthermore, it is anticipated that the patient will be medically stable for discharge from the hospital within 2 midnights of admission.   Zada Finders MD [facility]  If 7PM-7AM, please contact night-coverage [email or web address]  02/01/2022, 11:19 PM

## 2022-02-01 NOTE — ED Provider Triage Note (Signed)
Emergency Medicine Provider Triage Evaluation Note  Brandy Saunders , a 31 y.o. female  was evaluated in triage.  Pt complains of left lower extremity pain and warmth.  Seen by ED yesterday, started on doxycycline.  The redness has spread to her lower extremity to her mid calf.  She also having posterior calf pain.  Seen by primary sent to ED for rule out of septic joint versus DVT versus cellulitis.  Patient is having fevers at home, no history of previous DVTs not on blood thinners..  Review of Systems  Per HPI  Physical Exam  BP 118/64   Pulse (!) 108   Temp 99 F (37.2 C)   Resp 20   SpO2 97%  Gen:   Awake, no distress   Resp:  Normal effort  MSK:   Moves extremities without difficulty  Other:  Left lower extremity erythematous and warm.  Posterior calf tenderness.  DP PT 1+  Medical Decision Making  Medically screening exam initiated at 3:45 PM.  Appropriate orders placed.  Mayra L Butson was informed that the remainder of the evaluation will be completed by another provider, this initial triage assessment does not replace that evaluation, and the importance of remaining in the ED until their evaluation is complete.     Sherrill Raring, PA-C 02/01/22 1546

## 2022-02-01 NOTE — Assessment & Plan Note (Addendum)
Patient maintained on home regimen albuterol as needed.

## 2022-02-01 NOTE — Assessment & Plan Note (Addendum)
-  Blood pressure somewhat soft this morning.   -Hold chlorthalidone.   -Continue home regimen Toprol-XL.

## 2022-02-01 NOTE — ED Notes (Signed)
Pt is ambulatory from ED lobby to H18.

## 2022-02-01 NOTE — Progress Notes (Signed)
Brandy Saunders a 31 y.o. female admitted on 02/01/2022 with cellulitis. Patient diagnosed with cellulitis 01/31/22 and prescribed doxycycline. Imaging negative for DVT. Pharmacy has been consulted for Vancomycin dosing.  02/01/2022: Scr 0.99 (BL~0.7), WBC 17.4  Vital Signs: Tm 100.6, HR elevated, BP WNL  CrCl cannot be calculated (Patient's most recent lab result is older than the maximum 21 days allowed.).  Plan: GIVE Vancomycin 2,500 mg IV x1 (Wt used: 200 kg- via flowsheet data)  THEN Vancomycin 1,250 mg IV Q8H  Monitor renal function, clinical status, de-escalation, C/S, levels as indicated   Allergies:  Allergies  Allergen Reactions   Ibuprofen Diarrhea   Meloxicam Other (See Comments)    Pt stated, "It made my emotions out of wack; it made my left hand numb" Pt stated, "It made my emotions out of wack; it made my left hand numb" Pt stated, "It made my emotions out of wack; it made my left hand numb" Pt stated, "It made my emotions out of wack; it made my left hand numb"     There were no vitals filed for this visit.     Latest Ref Rng & Units 05/25/2021    6:11 PM 07/18/2020   10:04 PM 02/26/2020    1:49 PM  CBC  WBC 4.0 - 10.5 K/uL 6.0   5.6   4.5    Hemoglobin 12.0 - 15.0 g/dL 13.4   13.8   13.5    Hematocrit 36.0 - 46.0 % 40.5   42.5   40.6    Platelets 150 - 400 K/uL 272   234   256.0     Antimicrobials this admission: Vancomycin 02/01/2022>>   Microbiology results: N/A  Thank you for allowing pharmacy to be a part of this patient's care.  Adria Dill, PharmD PGY-1 Acute Care Resident  02/01/2022 8:45 PM

## 2022-02-02 DIAGNOSIS — J45909 Unspecified asthma, uncomplicated: Secondary | ICD-10-CM | POA: Diagnosis not present

## 2022-02-02 DIAGNOSIS — L03116 Cellulitis of left lower limb: Secondary | ICD-10-CM | POA: Diagnosis not present

## 2022-02-02 DIAGNOSIS — I10 Essential (primary) hypertension: Secondary | ICD-10-CM

## 2022-02-02 DIAGNOSIS — Z6841 Body Mass Index (BMI) 40.0 and over, adult: Secondary | ICD-10-CM

## 2022-02-02 LAB — BASIC METABOLIC PANEL
Anion gap: 8 (ref 5–15)
BUN: 17 mg/dL (ref 6–20)
CO2: 23 mmol/L (ref 22–32)
Calcium: 8.6 mg/dL — ABNORMAL LOW (ref 8.9–10.3)
Chloride: 102 mmol/L (ref 98–111)
Creatinine, Ser: 1.44 mg/dL — ABNORMAL HIGH (ref 0.44–1.00)
GFR, Estimated: 50 mL/min — ABNORMAL LOW (ref >60)
Glucose, Bld: 129 mg/dL — ABNORMAL HIGH (ref 70–99)
Potassium: 3.6 mmol/L (ref 3.5–5.1)
Sodium: 133 mmol/L — ABNORMAL LOW (ref 135–145)

## 2022-02-02 LAB — LACTIC ACID, PLASMA: Lactic Acid, Venous: 1.1 mmol/L (ref 0.5–1.9)

## 2022-02-02 LAB — CBC
HCT: 38.1 % (ref 36.0–46.0)
Hemoglobin: 12.6 g/dL (ref 12.0–15.0)
MCH: 28.1 pg (ref 26.0–34.0)
MCHC: 33.1 g/dL (ref 30.0–36.0)
MCV: 85 fL (ref 80.0–100.0)
Platelets: 202 10*3/uL (ref 150–400)
RBC: 4.48 MIL/uL (ref 3.87–5.11)
RDW: 14.1 % (ref 11.5–15.5)
WBC: 15.1 10*3/uL — ABNORMAL HIGH (ref 4.0–10.5)
nRBC: 0 % (ref 0.0–0.2)

## 2022-02-02 LAB — HIV ANTIBODY (ROUTINE TESTING W REFLEX): HIV Screen 4th Generation wRfx: NONREACTIVE

## 2022-02-02 MED ORDER — TRAMADOL HCL 50 MG PO TABS
50.0000 mg | ORAL_TABLET | Freq: Once | ORAL | Status: AC
  Administered 2022-02-02: 50 mg via ORAL
  Filled 2022-02-02: qty 1

## 2022-02-02 MED ORDER — BUPROPION HCL ER (XL) 150 MG PO TB24
300.0000 mg | ORAL_TABLET | Freq: Every morning | ORAL | Status: DC
  Administered 2022-02-02 – 2022-02-06 (×5): 300 mg via ORAL
  Filled 2022-02-02 (×3): qty 2
  Filled 2022-02-02: qty 1
  Filled 2022-02-02: qty 2
  Filled 2022-02-02: qty 1

## 2022-02-02 MED ORDER — VANCOMYCIN HCL 2000 MG/400ML IV SOLN
2000.0000 mg | INTRAVENOUS | Status: DC
  Administered 2022-02-03 – 2022-02-05 (×3): 2000 mg via INTRAVENOUS
  Filled 2022-02-02 (×4): qty 400

## 2022-02-02 NOTE — Progress Notes (Signed)
PROGRESS NOTE    Brandy Saunders  WIO:[id number] DOB: 03-14-1991 DOA: 02/01/2022 PCP: [facility]    Chief Complaint  Patient presents with   Leg Pain    Brief Narrative:  Brandy Saunders is a 31 y.o. female with medical history significant for asthma, HTN, depression/anxiety, morbid obesity who is admitted with left lower extremity cellulitis.    Assessment & Plan:  Principal Problem:   Cellulitis of left lower extremity Active Problems:   Class 3 severe obesity with serious comorbidity and body mass index (BMI) greater than or equal to 70 in adult [facility])   Asthma without acute exacerbation   Essential hypertension    Assessment and Plan: * Cellulitis of left lower extremity Left lower extremity cellulitis failing outpatient antibiotics with leukocytosis and fever.  Has been seeing wound care for left knee wound which appears to be healing well. -Noted to have a temp of 100.6 last night, leukocytosis trending down. -Continue IV vancomycin and ceftriaxone -Follow blood cultures  Essential hypertension -Controlled on current home regimen of chlorthalidone and Toprol-XL.  Asthma without acute exacerbation Continue albuterol as needed.  Class 3 severe obesity with serious comorbidity and body mass index (BMI) greater than or equal to 70 in adult [facility]) - Lifestyle modification. -Outpatient follow-up with PCP.         DVT prophylaxis: Lovenox Code Status: Full Family Communication: Updated patient.  No family at bedside. Disposition: Home when clinically improved and transition to oral antibiotics.  Status is: Observation The patient remains OBS appropriate and will d/c before 2 midnights.   Consultants:  None  Procedures:  Plain films of the left tib-fib 02/01/2022  Antimicrobials:  IV vancomycin 02/01/2022>>>> IV Rocephin 02/02/2022>>>   Subjective: Sitting up in chair. States less chills. Less fever. Still with significant LLE pain and  swelling and pain on weight bearing.   Objective: Vitals:   02/01/22 1541 02/01/22 2013 02/02/22 0502 02/02/22 0852  BP: 118/64 117/70 122/72 121/79  Pulse: (!) 108 100 96 90  Resp: '20 16 16 18  '$ Temp: 99 F (37.2 C) (!) 100.6 F (38.1 C) 98.4 F (36.9 C) 98.7 F (37.1 C)  TempSrc:  Oral Oral Oral  SpO2: 97% 97% 100% 98%    Intake/Output Summary (Last 24 hours) at 02/02/2022 1100 Last data filed at 02/02/2022 3845 Gross per 24 hour  Intake 250 ml  Output --  Net 250 ml   There were no vitals filed for this visit.  Examination:  General exam: Appears calm and comfortable  Respiratory system: Clear to auscultation. Respiratory effort normal. Cardiovascular system: S1 & S2 heard, RRR. No JVD, murmurs, rubs, gallops or clicks. No pedal edema. Gastrointestinal system: Abdomen is nondistended, soft and nontender. No organomegaly or masses felt. Normal bowel sounds heard. Central nervous system: Alert and oriented. No focal neurological deficits. Extremities: Left lower extremity with significant edema, erythema, significant tenderness to palpation.  Skin: No rashes, lesions or ulcers Psychiatry: Judgement and insight appear normal. Mood & affect appropriate.     Data Reviewed:   CBC: Recent Labs  Lab 02/01/22 2006 02/02/22 0311  WBC 17.4* 15.1*  NEUTROABS 15.0*  --   HGB 13.7 12.6  HCT 40.7 38.1  MCV 82.4 85.0  PLT 233 364    Basic Metabolic Panel: Recent Labs  Lab 02/01/22 2006 02/02/22 0311  NA 134* 133*  K 3.8 3.6  CL 102 102  CO2 21* 23  GLUCOSE 134* 129*  BUN 14 17  CREATININE 0.99 1.44*  CALCIUM 9.1 8.6*    GFR: CrCl cannot be calculated (Unknown ideal weight.).  Liver Function Tests: No results for input(s): "AST", "ALT", "ALKPHOS", "BILITOT", "PROT", "ALBUMIN" in the last 168 hours.  CBG: No results for input(s): "GLUCAP" in the last 168 hours.   No results found for this or any previous visit (from the past 240 hour(s)).        Radiology Studies: VAS [place] LOWER EXTREMITY VENOUS (DVT) (7a-7p)  Result Date: 02/01/2022  Lower Venous DVT Study Patient Name:  Brandy Saunders  Date of Exam:   02/01/2022 Medical Rec #: [id number]          Accession #:    [id number] Date of Birth: 12/14/1990           Patient Gender: F Patient Age:   89 years Exam Location:  [facility] Procedure:      VAS [place] LOWER EXTREMITY VENOUS (DVT) Referring Phys: Brandy Saunders --------------------------------------------------------------------------------  Indications: Pain & Erythema - patient started on antibiotics yesterday for LLE cellulitis.  Limitations: Body habitus and poor ultrasound/tissue interface. Comparison Study: Previous LLEV exam on 12/13/17 was negative for DVT. Performing Technologist: Rogelia Rohrer RVT, RDMS  Examination Guidelines: A complete evaluation includes B-mode imaging, spectral Doppler, color Doppler, and power Doppler as needed of all accessible portions of each vessel. Bilateral testing is considered an integral part of a complete examination. Limited examinations for reoccurring indications may be performed as noted. The reflux portion of the exam is performed with the patient in reverse Trendelenburg.  +-----+---------------+---------+-----------+----------+--------------+ RIGHTCompressibilityPhasicitySpontaneityPropertiesThrombus Aging +-----+---------------+---------+-----------+----------+--------------+ CFV  Full           Yes      Yes                                 +-----+---------------+---------+-----------+----------+--------------+   +---------+---------------+---------+-----------+----------+--------------+ LEFT     CompressibilityPhasicitySpontaneityPropertiesThrombus Aging +---------+---------------+---------+-----------+----------+--------------+ CFV      Full           Yes      Yes                                 +---------+---------------+---------+-----------+----------+--------------+  SFJ      Full                                                        +---------+---------------+---------+-----------+----------+--------------+ FV Prox  Full           Yes      Yes                                 +---------+---------------+---------+-----------+----------+--------------+ FV Mid   Full           Yes      Yes                                 +---------+---------------+---------+-----------+----------+--------------+ FV DistalFull           Yes      Yes                                 +---------+---------------+---------+-----------+----------+--------------+  PFV      Full                                                        +---------+---------------+---------+-----------+----------+--------------+ POP      Full           Yes      Yes                                 +---------+---------------+---------+-----------+----------+--------------+ PTV      Full                                                        +---------+---------------+---------+-----------+----------+--------------+ PERO     Full                                                        +---------+---------------+---------+-----------+----------+--------------+     Summary: RIGHT: - No evidence of common femoral vein obstruction.  LEFT: - There is no evidence of deep vein thrombosis in the lower extremity.  - No cystic structure found in the popliteal fossa.  *See table(s) above for measurements and observations. Electronically signed by Harold Barban MD on 02/01/2022 at 10:54:59 PM.    Final    DG Tibia/Fibula Left  Result Date: 02/01/2022 CLINICAL DATA:  Rule out soft tissue gas. EXAM: LEFT TIBIA AND FIBULA - 2 VIEW COMPARISON:  Left hip radiograph dated 10/08/2019. FINDINGS: There is no acute fracture or dislocation. The bones are well mineralized. No significant arthritic changes. Mild diffuse subcutaneous edema. No radiopaque foreign object or soft tissue gas. IMPRESSION:  Mild diffuse subcutaneous edema. No soft tissue gas. Electronically Signed   By: Anner Crete M.D.   On: 02/01/2022 20:54        Scheduled Meds:  buPROPion  300 mg Oral q AM   chlorthalidone  12.5 mg Oral Daily   enoxaparin (LOVENOX) injection  100 mg Subcutaneous Q24H   metoprolol succinate  100 mg Oral Daily   Continuous Infusions:  cefTRIAXone (ROCEPHIN)  IV Stopped (02/02/22 0102)   [START ON 02/03/2022] vancomycin       LOS: 0 days    Time spent: 35 minutes    Irine Seal, MD [facility]   To contact the attending provider between 7A-7P or the covering provider during after hours 7P-7A, please log into the web site [email or web address] and access using universal [place] password for that web site. If you do not have the password, please call the hospital operator.  02/02/2022, 11:00 AM

## 2022-02-02 NOTE — Progress Notes (Signed)
Pt arrived to room 5N20 from the ED. Received report from Bland Span, Therapist, sports. See assessment. Will continue to monitor.

## 2022-02-02 NOTE — ED Notes (Signed)
Care assumed at this time, 1st round of antibiotics given already by prior nurse, however, only one set of blood cultures were taken. MD Posey Pronto made aware. 2nd set of cultures not to be drawn.

## 2022-02-02 NOTE — ED Notes (Signed)
Report taken from Grenelefe, care assumed at this time

## 2022-02-02 NOTE — Assessment & Plan Note (Signed)
noted 

## 2022-02-02 NOTE — Progress Notes (Signed)
Brandy Saunders a 31 y.o. female admitted on 02/02/2022 with cellulitis. Patient diagnosed with cellulitis 01/31/22 and prescribed doxycycline. Imaging negative for DVT. Pharmacy has been consulted for Vancomycin dosing.  Noted that SCr up to 1.44 (was 0.99). Pt states wt ~200 kg  CrCl cannot be calculated (Unknown ideal weight.).  Plan: Change Vancomycin to '2000mg'$  IV q24h (eAUC 428)>> wt 200kg, SCr 1.44 Monitor renal function closely, clinical status, de-escalation Vanc levels with Css in obese pt  Allergies:  Allergies  Allergen Reactions   Ibuprofen Diarrhea   Meloxicam Other (See Comments)    Pt stated, "It made my emotions out of wack; it made my left hand numb" Pt stated, "It made my emotions out of wack; it made my left hand numb" Pt stated, "It made my emotions out of wack; it made my left hand numb" Pt stated, "It made my emotions out of wack; it made my left hand numb"     There were no vitals filed for this visit.     Latest Ref Rng & Units 02/02/2022    3:11 AM 02/01/2022    8:06 PM 05/25/2021    6:11 PM  CBC  WBC 4.0 - 10.5 K/uL 15.1  17.4  6.0   Hemoglobin 12.0 - 15.0 g/dL 12.6  13.7  13.4   Hematocrit 36.0 - 46.0 % 38.1  40.7  40.5   Platelets 150 - 400 K/uL 202  233  272    Antimicrobials this admission: 6/7 Vancomycin >> 6/7 Rocephin >>  Microbiology results: 6/7 BCx:  Thank you for allowing pharmacy to be a part of this patient's care.  Sherlon Handing, PharmD, BCPS Please see amion for complete clinical pharmacist phone list 02/02/2022 10:01 AM

## 2022-02-02 NOTE — Plan of Care (Signed)
  Problem: Pain Managment: Goal: General experience of comfort will improve Outcome: Progressing   Problem: Safety: Goal: Ability to remain free from injury will improve Outcome: Progressing   Problem: Skin Integrity: Goal: Risk for impaired skin integrity will decrease Outcome: Progressing   

## 2022-02-03 DIAGNOSIS — N179 Acute kidney failure, unspecified: Secondary | ICD-10-CM | POA: Diagnosis present

## 2022-02-03 DIAGNOSIS — E559 Vitamin D deficiency, unspecified: Secondary | ICD-10-CM | POA: Diagnosis present

## 2022-02-03 DIAGNOSIS — I10 Essential (primary) hypertension: Secondary | ICD-10-CM | POA: Diagnosis present

## 2022-02-03 DIAGNOSIS — Z833 Family history of diabetes mellitus: Secondary | ICD-10-CM | POA: Diagnosis not present

## 2022-02-03 DIAGNOSIS — Z811 Family history of alcohol abuse and dependence: Secondary | ICD-10-CM | POA: Diagnosis not present

## 2022-02-03 DIAGNOSIS — K219 Gastro-esophageal reflux disease without esophagitis: Secondary | ICD-10-CM | POA: Diagnosis present

## 2022-02-03 DIAGNOSIS — R7303 Prediabetes: Secondary | ICD-10-CM | POA: Diagnosis present

## 2022-02-03 DIAGNOSIS — I890 Lymphedema, not elsewhere classified: Secondary | ICD-10-CM | POA: Diagnosis present

## 2022-02-03 DIAGNOSIS — F32A Depression, unspecified: Secondary | ICD-10-CM | POA: Diagnosis present

## 2022-02-03 DIAGNOSIS — Z6841 Body Mass Index (BMI) 40.0 and over, adult: Secondary | ICD-10-CM | POA: Diagnosis not present

## 2022-02-03 DIAGNOSIS — Z8249 Family history of ischemic heart disease and other diseases of the circulatory system: Secondary | ICD-10-CM | POA: Diagnosis not present

## 2022-02-03 DIAGNOSIS — K760 Fatty (change of) liver, not elsewhere classified: Secondary | ICD-10-CM | POA: Diagnosis present

## 2022-02-03 DIAGNOSIS — Z803 Family history of malignant neoplasm of breast: Secondary | ICD-10-CM | POA: Diagnosis not present

## 2022-02-03 DIAGNOSIS — F419 Anxiety disorder, unspecified: Secondary | ICD-10-CM | POA: Diagnosis present

## 2022-02-03 DIAGNOSIS — L03116 Cellulitis of left lower limb: Secondary | ICD-10-CM | POA: Diagnosis present

## 2022-02-03 DIAGNOSIS — Z823 Family history of stroke: Secondary | ICD-10-CM | POA: Diagnosis not present

## 2022-02-03 DIAGNOSIS — J45909 Unspecified asthma, uncomplicated: Secondary | ICD-10-CM | POA: Diagnosis present

## 2022-02-03 LAB — CBC WITH DIFFERENTIAL/PLATELET
Abs Immature Granulocytes: 0.06 10*3/uL (ref 0.00–0.07)
Basophils Absolute: 0 10*3/uL (ref 0.0–0.1)
Basophils Relative: 0 %
Eosinophils Absolute: 0.1 10*3/uL (ref 0.0–0.5)
Eosinophils Relative: 1 %
HCT: 36.8 % (ref 36.0–46.0)
Hemoglobin: 12.1 g/dL (ref 12.0–15.0)
Immature Granulocytes: 1 %
Lymphocytes Relative: 22 %
Lymphs Abs: 2.1 10*3/uL (ref 0.7–4.0)
MCH: 27.8 pg (ref 26.0–34.0)
MCHC: 32.9 g/dL (ref 30.0–36.0)
MCV: 84.4 fL (ref 80.0–100.0)
Monocytes Absolute: 0.4 10*3/uL (ref 0.1–1.0)
Monocytes Relative: 4 %
Neutro Abs: 7.2 10*3/uL (ref 1.7–7.7)
Neutrophils Relative %: 72 %
Platelets: 236 10*3/uL (ref 150–400)
RBC: 4.36 MIL/uL (ref 3.87–5.11)
RDW: 14.1 % (ref 11.5–15.5)
WBC: 9.9 10*3/uL (ref 4.0–10.5)
nRBC: 0 % (ref 0.0–0.2)

## 2022-02-03 LAB — BASIC METABOLIC PANEL
Anion gap: 8 (ref 5–15)
BUN: 17 mg/dL (ref 6–20)
CO2: 24 mmol/L (ref 22–32)
Calcium: 8.5 mg/dL — ABNORMAL LOW (ref 8.9–10.3)
Chloride: 104 mmol/L (ref 98–111)
Creatinine, Ser: 1.34 mg/dL — ABNORMAL HIGH (ref 0.44–1.00)
GFR, Estimated: 54 mL/min — ABNORMAL LOW (ref >60)
Glucose, Bld: 116 mg/dL — ABNORMAL HIGH (ref 70–99)
Potassium: 3.9 mmol/L (ref 3.5–5.1)
Sodium: 136 mmol/L (ref 135–145)

## 2022-02-03 MED ORDER — POLYETHYLENE GLYCOL 3350 17 G PO PACK: 17.0000 g | PACK | Freq: Every day | ORAL | Status: DC | PRN

## 2022-02-03 MED ORDER — CHLORTHALIDONE 25 MG PO TABS
12.5000 mg | ORAL_TABLET | Freq: Every day | ORAL | Status: DC
  Administered 2022-02-04 – 2022-02-06 (×3): 12.5 mg via ORAL
  Filled 2022-02-03 (×3): qty 0.5

## 2022-02-03 MED ORDER — FUROSEMIDE 10 MG/ML IJ SOLN
20.0000 mg | Freq: Once | INTRAMUSCULAR | Status: AC
Start: 2022-02-03 — End: 2022-02-03
  Administered 2022-02-03: 20 mg via INTRAVENOUS
  Filled 2022-02-03: qty 2

## 2022-02-03 MED ORDER — METOPROLOL SUCCINATE ER 50 MG PO TB24: 100.0000 mg | ORAL_TABLET | Freq: Every day | ORAL | Status: DC

## 2022-02-03 MED ORDER — VITAMIN D 25 MCG (1000 UNIT) PO TABS
2000.0000 [IU] | ORAL_TABLET | Freq: Every day | ORAL | Status: DC
  Administered 2022-02-03 – 2022-02-06 (×4): 2000 [IU] via ORAL
  Filled 2022-02-03 (×4): qty 2

## 2022-02-03 MED ORDER — METOPROLOL SUCCINATE ER 50 MG PO TB24
100.0000 mg | ORAL_TABLET | Freq: Every day | ORAL | Status: DC
  Administered 2022-02-03 – 2022-02-05 (×3): 100 mg via ORAL
  Filled 2022-02-03 (×4): qty 2

## 2022-02-03 MED ORDER — ASCORBIC ACID 500 MG PO TABS
1000.0000 mg | ORAL_TABLET | Freq: Every day | ORAL | Status: DC
  Administered 2022-02-03 – 2022-02-06 (×4): 1000 mg via ORAL
  Filled 2022-02-03 (×4): qty 2

## 2022-02-03 MED ORDER — OXYCODONE-ACETAMINOPHEN 5-325 MG PO TABS
1.0000 | ORAL_TABLET | ORAL | Status: DC | PRN
  Administered 2022-02-03 – 2022-02-05 (×6): 1 via ORAL
  Filled 2022-02-03 (×6): qty 1

## 2022-02-03 NOTE — Progress Notes (Signed)
PT Cancellation Note  Patient Details Name: Brandy Saunders MRN: [id number] DOB: [birth year]   Cancelled Treatment:    Reason Eval/Treat Not Completed: Pain limiting ability to participate.  Pt is going to finish a meal and then take pain meds.   Retry in a later time.   Ramond Dial 02/03/2022, 10:51 AM  Mee Hives, PT PhD Acute Rehab Dept. Number: [place]

## 2022-02-03 NOTE — Progress Notes (Signed)
PT Cancellation Note  Patient Details Name: Brandy Saunders MRN: [id number] DOB: 06-05-1991   Cancelled Treatment:    Reason Eval/Treat Not Completed: Pain limiting ability to participate;Fatigue/lethargy limiting ability to participate.  Three total attempts to see pt who is sleeping or in pain.  Declining politely, will retry as time and pt allow.   Ramond Dial 02/03/2022, 2:30 PM  Mee Hives, PT PhD Acute Rehab Dept. Number: [place] [facility] [place]

## 2022-02-03 NOTE — Progress Notes (Addendum)
PROGRESS NOTE    Brandy Saunders  UYQ:[id number] DOB: 17-Jun-1991 DOA: 02/01/2022 PCP: [facility]    Chief Complaint  Patient presents with   Leg Pain    Brief Narrative:  Brandy Saunders is a 31 y.o. female with medical history significant for asthma, HTN, depression/anxiety, morbid obesity who is admitted with left lower extremity cellulitis.    Assessment & Plan:  Principal Problem:   Cellulitis of left lower extremity Active Problems:   Class 3 severe obesity with serious comorbidity and body mass index (BMI) greater than or equal to 70 in adult [facility])   Asthma without acute exacerbation   Essential hypertension    Assessment and Plan: * Cellulitis of left lower extremity Left lower extremity cellulitis failing outpatient antibiotics with leukocytosis and fever.  Has been seeing wound care for left knee wound which appears to be healing well. -Noted to have a temp of 100.6 the night of 02/01/2022.  -Currently afebrile.  -Leukocytosis trending down.  -Still with significant swelling in lower extremity erythema and some tenderness to palpation.  -Continue IV vancomycin IV Rocephin.  -Keep left lower extremity elevated above heart.  -Lasix 20 mg IV x1.  -Follow blood cultures  Essential hypertension -Blood pressure somewhat soft this morning.   -Hold chlorthalidone.   -Continue home regimen Toprol-XL.   Asthma without acute exacerbation Continue albuterol as needed.  Class 3 severe obesity with serious comorbidity and body mass index (BMI) greater than or equal to 70 in adult [facility]) - Lifestyle modification. -Outpatient follow-up with PCP.         DVT prophylaxis: Lovenox Code Status: Full Family Communication: Updated patient.  Updated husband on the telephone. Disposition: Home when clinically improved and transitioned to oral antibiotics.  Status is: Observation The patient remains OBS appropriate and will d/c before 2 midnights.    Consultants:  None  Procedures:  Plain films of the left tib-fib 02/01/2022 Left lower extremity Dopplers 02/01/2022  Antimicrobials:  IV vancomycin 02/01/2022>>>> IV Rocephin 02/02/2022>>>   Subjective: Sitting up in chair.  Complains of pain in left lower extremity.  Still with swelling and redness.  Does not feel she is improving significantly as she would like.    Objective: Vitals:   02/02/22 1738 02/02/22 2011 02/03/22 0532 02/03/22 0811  BP: 111/68 114/82 (!) 96/46 (!) 118/59  Pulse: 87 91 91 92  Resp: '20 18 20 18  '$ Temp: 98.2 F (36.8 C) 98.3 F (36.8 C) 98.5 F (36.9 C) 98.1 F (36.7 C)  TempSrc: Oral Oral Oral Oral  SpO2: 98% 100% 99% 98%    Intake/Output Summary (Last 24 hours) at 02/03/2022 1020 Last data filed at 02/03/2022 6387 Gross per 24 hour  Intake 632.53 ml  Output --  Net 632.53 ml   There were no vitals filed for this visit.  Examination:  General exam: NAD. Respiratory system: Clear to auscultation bilaterally.no wheezes, no crackles, no rhonchi.  Fair air movement.   Cardiovascular system: Regular rate rhythm no murmurs rubs or gallops.  No JVD.  Left lower extremity edema.  Gastrointestinal system: Abdomen is nondistended, soft and nontender. No organomegaly or masses felt. Normal bowel sounds heard. Central nervous system: Alert and oriented. No focal neurological deficits. Extremities: Left lower extremity with significant edema, erythema, significant tenderness to palpation.  Skin: No rashes, lesions or ulcers Psychiatry: Judgement and insight appear normal. Mood & affect appropriate.     Data Reviewed:   CBC: Recent Labs  Lab 02/01/22 2006  02/02/22 0311 02/03/22 0452  WBC 17.4* 15.1* 9.9  NEUTROABS 15.0*  --  7.2  HGB 13.7 12.6 12.1  HCT 40.7 38.1 36.8  MCV 82.4 85.0 84.4  PLT 233 202 035    Basic Metabolic Panel: Recent Labs  Lab 02/01/22 2006 02/02/22 0311 02/03/22 0452  NA 134* 133* 136  K 3.8 3.6 3.9  CL 102 102 104   CO2 21* 23 24  GLUCOSE 134* 129* 116*  BUN '14 17 17  '$ CREATININE 0.99 1.44* 1.34*  CALCIUM 9.1 8.6* 8.5*    GFR: CrCl cannot be calculated (Unknown ideal weight.).  Liver Function Tests: No results for input(s): "AST", "ALT", "ALKPHOS", "BILITOT", "PROT", "ALBUMIN" in the last 168 hours.  CBG: No results for input(s): "GLUCAP" in the last 168 hours.   Recent Results (from the past 240 hour(s))  Blood culture (routine x 2)     Status: None (Preliminary result)   Collection Time: 02/01/22  8:57 PM   Specimen: BLOOD  Result Value Ref Range Status   Specimen Description BLOOD RIGHT ANTECUBITAL  Final   Special Requests   Final    BOTTLES DRAWN AEROBIC AND ANAEROBIC Blood Culture adequate volume   Culture   Final    NO GROWTH < 24 HOURS Performed at [facility], [address] [place], [place] [address]    Report Status PENDING  Incomplete         Radiology Studies: VAS [place] LOWER EXTREMITY VENOUS (DVT) (7a-7p)  Result Date: 02/01/2022  Lower Venous DVT Study Patient Name:  Brandy Saunders  Date of Exam:   02/01/2022 Medical Rec #: [id number]          Accession #:    [id number] Date of Birth: 17-Apr-1991           Patient Gender: F Patient Age:   31 years Exam Location:  [facility] Procedure:      VAS [place] LOWER EXTREMITY VENOUS (DVT) Referring Phys: HALEY SAGE --------------------------------------------------------------------------------  Indications: Pain & Erythema - patient started on antibiotics yesterday for LLE cellulitis.  Limitations: Body habitus and poor ultrasound/tissue interface. Comparison Study: Previous LLEV exam on 12/13/17 was negative for DVT. Performing Technologist: Rogelia Rohrer RVT, RDMS  Examination Guidelines: A complete evaluation includes B-mode imaging, spectral Doppler, color Doppler, and power Doppler as needed of all accessible portions of each vessel. Bilateral testing is considered an integral part of a complete examination. Limited  examinations for reoccurring indications may be performed as noted. The reflux portion of the exam is performed with the patient in reverse Trendelenburg.  +-----+---------------+---------+-----------+----------+--------------+ RIGHTCompressibilityPhasicitySpontaneityPropertiesThrombus Aging +-----+---------------+---------+-----------+----------+--------------+ CFV  Full           Yes      Yes                                 +-----+---------------+---------+-----------+----------+--------------+   +---------+---------------+---------+-----------+----------+--------------+ LEFT     CompressibilityPhasicitySpontaneityPropertiesThrombus Aging +---------+---------------+---------+-----------+----------+--------------+ CFV      Full           Yes      Yes                                 +---------+---------------+---------+-----------+----------+--------------+ SFJ      Full                                                        +---------+---------------+---------+-----------+----------+--------------+  FV Prox  Full           Yes      Yes                                 +---------+---------------+---------+-----------+----------+--------------+ FV Mid   Full           Yes      Yes                                 +---------+---------------+---------+-----------+----------+--------------+ FV DistalFull           Yes      Yes                                 +---------+---------------+---------+-----------+----------+--------------+ PFV      Full                                                        +---------+---------------+---------+-----------+----------+--------------+ POP      Full           Yes      Yes                                 +---------+---------------+---------+-----------+----------+--------------+ PTV      Full                                                         +---------+---------------+---------+-----------+----------+--------------+ PERO     Full                                                        +---------+---------------+---------+-----------+----------+--------------+     Summary: RIGHT: - No evidence of common femoral vein obstruction.  LEFT: - There is no evidence of deep vein thrombosis in the lower extremity.  - No cystic structure found in the popliteal fossa.  *See table(s) above for measurements and observations. Electronically signed by Harold Barban MD on 02/01/2022 at 10:54:59 PM.    Final    DG Tibia/Fibula Left  Result Date: 02/01/2022 CLINICAL DATA:  Rule out soft tissue gas. EXAM: LEFT TIBIA AND FIBULA - 2 VIEW COMPARISON:  Left hip radiograph dated 10/08/2019. FINDINGS: There is no acute fracture or dislocation. The bones are well mineralized. No significant arthritic changes. Mild diffuse subcutaneous edema. No radiopaque foreign object or soft tissue gas. IMPRESSION: Mild diffuse subcutaneous edema. No soft tissue gas. Electronically Signed   By: Anner Crete M.D.   On: 02/01/2022 20:54        Scheduled Meds:  vitamin C  1,000 mg Oral Daily   buPROPion  300 mg Oral q AM   [START ON 02/04/2022] chlorthalidone  12.5 mg Oral Daily   cholecalciferol  2,000 Units Oral Daily  enoxaparin (LOVENOX) injection  100 mg Subcutaneous Q24H   metoprolol succinate  100 mg Oral Daily   Continuous Infusions:  cefTRIAXone (ROCEPHIN)  IV 2 g (02/02/22 2317)   vancomycin 2,000 mg (02/03/22 0536)     LOS: 0 days    Time spent: 35 minutes    Irine Seal, MD [facility]   To contact the attending provider between 7A-7P or the covering provider during after hours 7P-7A, please log into the web site [email or web address] and access using universal [place] password for that web site. If you do not have the password, please call the hospital operator.  02/03/2022, 10:20 AM

## 2022-02-04 ENCOUNTER — Inpatient Hospital Stay (HOSPITAL_COMMUNITY): Payer: BC Managed Care – PPO

## 2022-02-04 DIAGNOSIS — J45909 Unspecified asthma, uncomplicated: Secondary | ICD-10-CM | POA: Diagnosis not present

## 2022-02-04 DIAGNOSIS — L03116 Cellulitis of left lower limb: Secondary | ICD-10-CM | POA: Diagnosis not present

## 2022-02-04 DIAGNOSIS — I10 Essential (primary) hypertension: Secondary | ICD-10-CM | POA: Diagnosis not present

## 2022-02-04 LAB — BASIC METABOLIC PANEL
Anion gap: 9 (ref 5–15)
BUN: 10 mg/dL (ref 6–20)
CO2: 24 mmol/L (ref 22–32)
Calcium: 8.6 mg/dL — ABNORMAL LOW (ref 8.9–10.3)
Chloride: 100 mmol/L (ref 98–111)
Creatinine, Ser: 1.1 mg/dL — ABNORMAL HIGH (ref 0.44–1.00)
GFR, Estimated: >60 mL/min (ref >60)
Glucose, Bld: 130 mg/dL — ABNORMAL HIGH (ref 70–99)
Potassium: 3.6 mmol/L (ref 3.5–5.1)
Sodium: 133 mmol/L — ABNORMAL LOW (ref 135–145)

## 2022-02-04 LAB — CBC WITH DIFFERENTIAL/PLATELET
Abs Immature Granulocytes: 0.1 10*3/uL — ABNORMAL HIGH (ref 0.00–0.07)
Basophils Absolute: 0.1 10*3/uL (ref 0.0–0.1)
Basophils Relative: 0 %
Eosinophils Absolute: 0 10*3/uL (ref 0.0–0.5)
Eosinophils Relative: 0 %
HCT: 36.2 % (ref 36.0–46.0)
Hemoglobin: 11.7 g/dL — ABNORMAL LOW (ref 12.0–15.0)
Immature Granulocytes: 1 %
Lymphocytes Relative: 16 %
Lymphs Abs: 2.2 10*3/uL (ref 0.7–4.0)
MCH: 27.3 pg (ref 26.0–34.0)
MCHC: 32.3 g/dL (ref 30.0–36.0)
MCV: 84.6 fL (ref 80.0–100.0)
Monocytes Absolute: 0.9 10*3/uL (ref 0.1–1.0)
Monocytes Relative: 7 %
Neutro Abs: 10 10*3/uL — ABNORMAL HIGH (ref 1.7–7.7)
Neutrophils Relative %: 76 %
Platelets: 245 10*3/uL (ref 150–400)
RBC: 4.28 MIL/uL (ref 3.87–5.11)
RDW: 14.4 % (ref 11.5–15.5)
WBC: 13.2 10*3/uL — ABNORMAL HIGH (ref 4.0–10.5)
nRBC: 0 % (ref 0.0–0.2)

## 2022-02-04 LAB — VANCOMYCIN, RANDOM: Vancomycin Rm: 13 ug/mL

## 2022-02-04 MED ORDER — CEFAZOLIN SODIUM-DEXTROSE 2-4 GM/100ML-% IV SOLN
2.0000 g | Freq: Three times a day (TID) | INTRAVENOUS | Status: DC
Start: 2022-02-04 — End: 2022-02-07
  Administered 2022-02-04 – 2022-02-06 (×6): 2 g via INTRAVENOUS
  Filled 2022-02-04 (×6): qty 100

## 2022-02-04 MED ORDER — CLINDAMYCIN HCL 300 MG PO CAPS
600.0000 mg | ORAL_CAPSULE | Freq: Three times a day (TID) | ORAL | Status: AC
  Administered 2022-02-04 – 2022-02-06 (×6): 600 mg via ORAL
  Filled 2022-02-04 (×7): qty 2

## 2022-02-04 MED ORDER — IOHEXOL 300 MG/ML  SOLN
100.0000 mL | Freq: Once | INTRAMUSCULAR | Status: AC | PRN
  Administered 2022-02-04: 100 mL via INTRAVENOUS

## 2022-02-04 NOTE — Progress Notes (Addendum)
PT Note Talked to patient.  Patient was completely independent prior to admission and has moved around in room and does not feel she needs PT.  Will ask mobility specialist to help mobilize her.   Addendum:  At 1215 walked by patient's room and she can mobilizing independently with no balance issues.  PT will sign off.    02/04/2022 Lesleigh Noe, [place] Office:  [phone]

## 2022-02-04 NOTE — Consult Note (Signed)
[facility] for Infectious Disease  Total days of antibiotics 4/vanco and ceftriaxone               Reason for Consult:cellulitis   Referring Physician: thompson  Principal Problem:   Cellulitis of left lower extremity Active Problems:   Class 3 severe obesity with serious comorbidity and body mass index (BMI) greater than or equal to 70 in adult [facility])   Asthma without acute exacerbation   Essential hypertension    HPI: Brandy Saunders is a 31 y.o. female with history of chronic lymphedema to left leg, who sustained a fall roughly 1 week prior to admission after tripping while dog walking and sustained abrasion to left anterior shin with some surrounding erythema. She took 2 days of doxycycline for which only minimally improved and followed up with [facility] for treating of ulcer. She subsequently had recurrent of theonset of painful redness and swelling to left lower leg involving shin,ankle and foot with fever. In the ED she was found to have temp of 101F with leukocytosis of 15K in the ED. She was started on vancomycin and ceftriaxone without significant improvement. She is developing more swelling and induration to her lower leg and pain behind her knee.  Blood cx are no growth to date  Lymphaedema usually well controlled with diet modification. Doesn't need lymph wrap or pump  Past Medical History:  Diagnosis Date   Alcohol abuse    Anxiety    Asthma    Back pain    Binge eating    Carpal tunnel syndrome, bilateral    Chest pain    Common migraine with intractable migraine 02/02/2017   Constipation    Depression    Dyspnea    Eczema    Fatty liver    GERD (gastroesophageal reflux disease)    Hypertension    Joint pain    Leg edema    Morbidly obese (HCC)    Palpitations    PCOS (polycystic ovarian syndrome)    Prediabetes    Sleep apnea    Urticaria    Vaginal Pap smear, abnormal    Vitamin D deficiency     Allergies:  Allergies  Allergen  Reactions   Ibuprofen Diarrhea   Meloxicam Other (See Comments)    Pt stated, "It made my emotions out of wack; it made my left hand numb" Pt stated, "It made my emotions out of wack; it made my left hand numb" Pt stated, "It made my emotions out of wack; it made my left hand numb" Pt stated, "It made my emotions out of wack; it made my left hand numb"     MEDICATIONS:  vitamin C  1,000 mg Oral Daily   buPROPion  300 mg Oral q AM   chlorthalidone  12.5 mg Oral Daily   cholecalciferol  2,000 Units Oral Daily   enoxaparin (LOVENOX) injection  100 mg Subcutaneous Q24H   metoprolol succinate  100 mg Oral Daily    Social History   Tobacco Use   Smoking status: Never   Smokeless tobacco: Never  Vaping Use   Vaping Use: Never used  Substance Use Topics   Alcohol use: Yes    Alcohol/week: 3.0 standard drinks of alcohol    Types: 3 Glasses of wine per week   Drug use: No    Types: Marijuana    Comment: once every 6 months, taking CBD    Family History  Problem Relation Age of Onset  Hypertension Mother    Hyperlipidemia Mother    Obesity Mother    Heart disease Mother    Stroke Father    Obesity Father    Alcoholism Father    Drug abuse Father    Sudden death Father    Diabetes Maternal Uncle    Heart disease Maternal Grandmother    Breast cancer Maternal Aunt    Colon cancer Neg Hx      Review of Systems  Constitutional: Negative for fever, chills, diaphoresis, activity change, appetite change, fatigue and unexpected weight change.  HENT: Negative for congestion, sore throat, rhinorrhea, sneezing, trouble swallowing and sinus pressure.  Eyes: Negative for photophobia and visual disturbance.  Respiratory: Negative for cough, chest tightness, shortness of breath, wheezing and stridor.  Cardiovascular: Negative for chest pain, palpitations and leg swelling.  Gastrointestinal: Negative for nausea, vomiting, abdominal pain, diarrhea, constipation, blood in stool,  abdominal distention and anal bleeding.  Genitourinary: Negative for dysuria, hematuria, flank pain and difficulty urinating.  Musculoskeletal: Negative for myalgias, back pain, joint swelling, arthralgias and gait problem.  Skin: +redness and tenderness to left leg.Negative for color change, pallor, rash and wound.  Neurological: Negative for dizziness, tremors, weakness and light-headedness.  Hematological: Negative for adenopathy. Does not bruise/bleed easily.  Psychiatric/Behavioral: Negative for behavioral problems, confusion, sleep disturbance, dysphoric mood, decreased concentration and agitation.    OBJECTIVE: Temp:  [98.8 F (37.1 C)] 98.8 F (37.1 C) (06/10 0451) Pulse Rate:  [98] 98 (06/10 0451) Resp:  [20] 20 (06/10 0451) BP: (119)/(42) 119/42 (06/10 0451) SpO2:  [98 %] 98 % (06/10 0451) Physical Exam  Constitutional:  oriented to person, place, and time. appears well-developed and well-nourished. No distress.  HENT: [place]/AT, PERRLA, no scleral icterus Mouth/Throat: Oropharynx is clear and moist. No oropharyngeal exudate.  Cardiovascular: Normal rate, regular rhythm and normal heart sounds. Exam reveals no gallop and no friction rub.  No murmur heard.  Pulmonary/Chest: Effort normal and breath sounds normal. No respiratory distress.  has no wheezes.  Neck = supple, no nuchal rigidity Abdominal: Soft. Bowel sounds are normal.  exhibits no distension. There is no tenderness.  Lymphadenopathy: no cervical adenopathy. No axillary adenopathy Ext: left leg marked induration with margination and marked swelling of dorsum of foott Skin: Skin is warm and dry. No rash noted. No erythema.  Psychiatric: a normal mood and affect.  behavior is normal.     LABS: Results for orders placed or performed during the hospital encounter of 02/01/22 (from the past 48 hour(s))  CBC with Differential/Platelet     Status: None   Collection Time: 02/03/22  4:52 AM  Result Value Ref Range   WBC  9.9 4.0 - 10.5 K/uL   RBC 4.36 3.87 - 5.11 MIL/uL   Hemoglobin 12.1 12.0 - 15.0 g/dL   HCT 36.8 36.0 - 46.0 %   MCV 84.4 80.0 - 100.0 fL   MCH 27.8 26.0 - 34.0 pg   MCHC 32.9 30.0 - 36.0 g/dL   RDW 14.1 11.5 - 15.5 %   Platelets 236 150 - 400 K/uL   nRBC 0.0 0.0 - 0.2 %   Neutrophils Relative % 72 %   Neutro Abs 7.2 1.7 - 7.7 K/uL   Lymphocytes Relative 22 %   Lymphs Abs 2.1 0.7 - 4.0 K/uL   Monocytes Relative 4 %   Monocytes Absolute 0.4 0.1 - 1.0 K/uL   Eosinophils Relative 1 %   Eosinophils Absolute 0.1 0.0 - 0.5 K/uL   Basophils Relative  0 %   Basophils Absolute 0.0 0.0 - 0.1 K/uL   Immature Granulocytes 1 %   Abs Immature Granulocytes 0.06 0.00 - 0.07 K/uL    Comment: Performed at [facility], [place] [address] [place], [place] [address]  Basic metabolic panel     Status: Abnormal   Collection Time: 02/03/22  4:52 AM  Result Value Ref Range   Sodium 136 135 - 145 mmol/L   Potassium 3.9 3.5 - 5.1 mmol/L   Chloride 104 98 - 111 mmol/L   CO2 24 22 - 32 mmol/L   Glucose, Bld 116 (H) 70 - 99 mg/dL    Comment: Glucose reference range applies only to samples taken after fasting for at least 8 hours.   BUN 17 6 - 20 mg/dL   Creatinine, Ser 1.34 (H) 0.44 - 1.00 mg/dL   Calcium 8.5 (L) 8.9 - 10.3 mg/dL   GFR, Estimated 54 (L) >60 mL/min    Comment: (NOTE) Calculated using the CKD-EPI Creatinine Equation (2021)    Anion gap 8 5 - 15    Comment: Performed at [facility] [address] [place], [place] [address]  CBC with Differential/Platelet     Status: Abnormal   Collection Time: 02/04/22  2:36 AM  Result Value Ref Range   WBC 13.2 (H) 4.0 - 10.5 K/uL   RBC 4.28 3.87 - 5.11 MIL/uL   Hemoglobin 11.7 (L) 12.0 - 15.0 g/dL   HCT 36.2 36.0 - 46.0 %   MCV 84.6 80.0 - 100.0 fL   MCH 27.3 26.0 - 34.0 pg   MCHC 32.3 30.0 - 36.0 g/dL   RDW 14.4 11.5 - 15.5 %   Platelets 245 150 - 400 K/uL   nRBC 0.0 0.0 - 0.2 %   Neutrophils Relative % 76 %   Neutro Abs 10.0 (H)  1.7 - 7.7 K/uL   Lymphocytes Relative 16 %   Lymphs Abs 2.2 0.7 - 4.0 K/uL   Monocytes Relative 7 %   Monocytes Absolute 0.9 0.1 - 1.0 K/uL   Eosinophils Relative 0 %   Eosinophils Absolute 0.0 0.0 - 0.5 K/uL   Basophils Relative 0 %   Basophils Absolute 0.1 0.0 - 0.1 K/uL   Immature Granulocytes 1 %   Abs Immature Granulocytes 0.10 (H) 0.00 - 0.07 K/uL    Comment: Performed at [facility] [address] [place], [place] [address]  Basic metabolic panel     Status: Abnormal   Collection Time: 02/04/22  2:36 AM  Result Value Ref Range   Sodium 133 (L) 135 - 145 mmol/L   Potassium 3.6 3.5 - 5.1 mmol/L   Chloride 100 98 - 111 mmol/L   CO2 24 22 - 32 mmol/L   Glucose, Bld 130 (H) 70 - 99 mg/dL    Comment: Glucose reference range applies only to samples taken after fasting for at least 8 hours.   BUN 10 6 - 20 mg/dL   Creatinine, Ser 1.10 (H) 0.44 - 1.00 mg/dL   Calcium 8.6 (L) 8.9 - 10.3 mg/dL   GFR, Estimated >60 >60 mL/min    Comment: (NOTE) Calculated using the CKD-EPI Creatinine Equation (2021)    Anion gap 9 5 - 15    Comment: Performed at [facility] [address] [place],  [address]  Vancomycin, random     Status: None   Collection Time: 02/04/22  2:58 PM  Result Value Ref Range   Vancomycin  Rm 13 ug/mL    Comment:        Random Vancomycin therapeutic range is dependent on dosage and time of specimen collection. A peak range is 20.0-40.0 ug/mL A trough range is 5.0-15.0 ug/mL        Performed at [facility] [address] [place], [place] [address]     MICRO: reviewed IMAGING: No results found.  Assessment/Plan:  31yo F with marked cellulitis to left leg slightly worsening since admission on vanco and ceftriaxone.  -will narrow ceftriaxone to cefazolin 2gm IV  Q8hr plus 3 days of oral clindamycin with toxin inhibition - recommend to get ct of lower leg and foot to see if any abscess. If not remarkable, then would get dopplers  to rule DVT - keep left leg elevated as much as possible.  Elzie Rings [facility] for Infectious Diseases [phone]

## 2022-02-04 NOTE — Progress Notes (Signed)
PROGRESS NOTE    Brandy Saunders  GBT:[id number] DOB: 1991/04/27 DOA: 02/01/2022 PCP: [facility]    Chief Complaint  Patient presents with   Leg Pain    Brief Narrative:  Brandy Saunders is a 31 y.o. female with medical history significant for asthma, HTN, depression/anxiety, morbid obesity who is admitted with left lower extremity cellulitis.    Assessment & Plan:  Principal Problem:   Cellulitis of left lower extremity Active Problems:   Class 3 severe obesity with serious comorbidity and body mass index (BMI) greater than or equal to 70 in adult [facility])   Asthma without acute exacerbation   Essential hypertension    Assessment and Plan: * Cellulitis of left lower extremity Left lower extremity cellulitis failing outpatient antibiotics with leukocytosis and fever.  Has been seeing wound care for left knee wound which appears to be healing well. -Noted to have a temp of 100.6 the night of 02/01/2022.  -Currently afebrile.  -Leukocytosis fluctuating, initially was trending down but trending back up. -Plain films which were done were negative for any abscess formation. -Lower extremity Dopplers negative for DVT. -Status post Lasix 20 mg IV x1 02/03/2022 -Still with significant swelling in lower extremity erythema and some tenderness to palpation.  --Due to no significant improvement will get CT of the tib-fib and foot for further evaluation. -Continue IV vancomycin IV Rocephin.  -Keep left lower extremity elevated above heart.  -Follow blood cultures -We will consult with ID for further evaluation and management  Essential hypertension -BP stable.   -Continue to hold chlorthalidone.   -Continue Toprol-XL.   Asthma without acute exacerbation Continue albuterol as needed.  Class 3 severe obesity with serious comorbidity and body mass index (BMI) greater than or equal to 70 in adult [facility]) - Lifestyle modification. -Outpatient follow-up with PCP.          DVT prophylaxis: Lovenox Code Status: Full Family Communication: Updated patient.  Disposition: Home when clinically improved and transitioned to oral antibiotics and when cleared by ID.  Status is: Inpatient    Consultants:  ID pending  Procedures:  Plain films of the left tib-fib 02/01/2022 Left lower extremity Dopplers 02/01/2022  Antimicrobials:  IV vancomycin 02/01/2022>>>> IV Rocephin 02/02/2022>>>   Subjective: Sitting up in chair.  Does not feel any significant improvement with left lower extremity cellulitis.  Still with redness, erythema, tenderness to palpation and pain with ambulation.   Objective: Vitals:   02/03/22 0532 02/03/22 0811 02/03/22 1535 02/04/22 0451  BP: (!) 96/46 (!) 118/59 124/65 (!) 119/42  Pulse: 91 92 (!) 102 98  Resp: '20 18  20  '$ Temp: 98.5 F (36.9 C) 98.1 F (36.7 C) 98.3 F (36.8 C) 98.8 F (37.1 C)  TempSrc: Oral Oral Oral Oral  SpO2: 99% 98% 98% 98%    Intake/Output Summary (Last 24 hours) at 02/04/2022 1050 Last data filed at 02/03/2022 1800 Gross per 24 hour  Intake 0 ml  Output --  Net 0 ml   There were no vitals filed for this visit.  Examination:  General exam: NAD. Respiratory system: CTA B.  No wheezes, no crackles, no rhonchi.  Normal respiratory effort.  Fair air movement.  Speaking in full sentences.   Cardiovascular system: RRR no murmurs rubs or gallops.  No JVD.  Left lower extremity edema. Gastrointestinal system: Abdomen is soft, nontender, nondistended, positive bowel sounds.  No rebound.  No guarding.  Central nervous system: Alert and oriented. No focal neurological deficits. Extremities:  Left lower extremity with significant edema, erythema, significant tenderness to palpation.  Skin: No rashes, lesions or ulcers Psychiatry: Judgement and insight appear normal. Mood & affect appropriate.     Data Reviewed:   CBC: Recent Labs  Lab 02/01/22 2006 02/02/22 0311 02/03/22 0452 02/04/22 0236  WBC 17.4*  15.1* 9.9 13.2*  NEUTROABS 15.0*  --  7.2 10.0*  HGB 13.7 12.6 12.1 11.7*  HCT 40.7 38.1 36.8 36.2  MCV 82.4 85.0 84.4 84.6  PLT 233 202 236 570    Basic Metabolic Panel: Recent Labs  Lab 02/01/22 2006 02/02/22 0311 02/03/22 0452 02/04/22 0236  NA 134* 133* 136 133*  K 3.8 3.6 3.9 3.6  CL 102 102 104 100  CO2 21* '23 24 24  '$ GLUCOSE 134* 129* 116* 130*  BUN '14 17 17 10  '$ CREATININE 0.99 1.44* 1.34* 1.10*  CALCIUM 9.1 8.6* 8.5* 8.6*    GFR: CrCl cannot be calculated (Unknown ideal weight.).  Liver Function Tests: No results for input(s): "AST", "ALT", "ALKPHOS", "BILITOT", "PROT", "ALBUMIN" in the last 168 hours.  CBG: No results for input(s): "GLUCAP" in the last 168 hours.   Recent Results (from the past 240 hour(s))  Blood culture (routine x 2)     Status: None (Preliminary result)   Collection Time: 02/01/22  8:57 PM   Specimen: BLOOD  Result Value Ref Range Status   Specimen Description BLOOD RIGHT ANTECUBITAL  Final   Special Requests   Final    BOTTLES DRAWN AEROBIC AND ANAEROBIC Blood Culture adequate volume   Culture   Final    NO GROWTH 3 DAYS Performed at [facility], [address], [place], [place] [address]    Report Status PENDING  Incomplete         Radiology Studies: No results found.      Scheduled Meds:  vitamin C  1,000 mg Oral Daily   buPROPion  300 mg Oral q AM   chlorthalidone  12.5 mg Oral Daily   cholecalciferol  2,000 Units Oral Daily   enoxaparin (LOVENOX) injection  100 mg Subcutaneous Q24H   metoprolol succinate  100 mg Oral Daily   Continuous Infusions:  cefTRIAXone (ROCEPHIN)  IV 2 g (02/03/22 2340)   vancomycin 2,000 mg (02/04/22 0511)     LOS: 1 day    Time spent: 35 minutes    Irine Seal, MD [facility]   To contact the attending provider between 7A-7P or the covering provider during after hours 7P-7A, please log into the web site [email or web address] and access using universal [place]  password for that web site. If you do not have the password, please call the hospital operator.  02/04/2022, 10:50 AM

## 2022-02-05 ENCOUNTER — Other Ambulatory Visit (INDEPENDENT_AMBULATORY_CARE_PROVIDER_SITE_OTHER): Payer: Self-pay | Admitting: Family Medicine

## 2022-02-05 DIAGNOSIS — J45909 Unspecified asthma, uncomplicated: Secondary | ICD-10-CM | POA: Diagnosis not present

## 2022-02-05 DIAGNOSIS — I10 Essential (primary) hypertension: Secondary | ICD-10-CM | POA: Diagnosis not present

## 2022-02-05 DIAGNOSIS — L03116 Cellulitis of left lower limb: Secondary | ICD-10-CM | POA: Diagnosis not present

## 2022-02-05 DIAGNOSIS — F3289 Other specified depressive episodes: Secondary | ICD-10-CM

## 2022-02-05 DIAGNOSIS — R519 Headache, unspecified: Secondary | ICD-10-CM | POA: Clinically undetermined

## 2022-02-05 LAB — CBC WITH DIFFERENTIAL/PLATELET
Abs Immature Granulocytes: 0.49 10*3/uL — ABNORMAL HIGH (ref 0.00–0.07)
Basophils Absolute: 0.1 10*3/uL (ref 0.0–0.1)
Basophils Relative: 0 %
Eosinophils Absolute: 0.1 10*3/uL (ref 0.0–0.5)
Eosinophils Relative: 1 %
HCT: 35 % — ABNORMAL LOW (ref 36.0–46.0)
Hemoglobin: 11.3 g/dL — ABNORMAL LOW (ref 12.0–15.0)
Immature Granulocytes: 4 %
Lymphocytes Relative: 17 %
Lymphs Abs: 2.2 10*3/uL (ref 0.7–4.0)
MCH: 27.1 pg (ref 26.0–34.0)
MCHC: 32.3 g/dL (ref 30.0–36.0)
MCV: 83.9 fL (ref 80.0–100.0)
Monocytes Absolute: 1 10*3/uL (ref 0.1–1.0)
Monocytes Relative: 8 %
Neutro Abs: 8.7 10*3/uL — ABNORMAL HIGH (ref 1.7–7.7)
Neutrophils Relative %: 70 %
Platelets: 258 10*3/uL (ref 150–400)
RBC: 4.17 MIL/uL (ref 3.87–5.11)
RDW: 14.5 % (ref 11.5–15.5)
WBC: 12.5 10*3/uL — ABNORMAL HIGH (ref 4.0–10.5)
nRBC: 0 % (ref 0.0–0.2)

## 2022-02-05 LAB — BASIC METABOLIC PANEL
Anion gap: 11 (ref 5–15)
BUN: 12 mg/dL (ref 6–20)
CO2: 25 mmol/L (ref 22–32)
Calcium: 8.8 mg/dL — ABNORMAL LOW (ref 8.9–10.3)
Chloride: 100 mmol/L (ref 98–111)
Creatinine, Ser: 1.04 mg/dL — ABNORMAL HIGH (ref 0.44–1.00)
GFR, Estimated: >60 mL/min (ref >60)
Glucose, Bld: 108 mg/dL — ABNORMAL HIGH (ref 70–99)
Potassium: 3.7 mmol/L (ref 3.5–5.1)
Sodium: 136 mmol/L (ref 135–145)

## 2022-02-05 LAB — VANCOMYCIN, TROUGH: Vancomycin Tr: 52 ug/mL (ref 15–20)

## 2022-02-05 MED ORDER — ACETAMINOPHEN 325 MG PO TABS
650.0000 mg | ORAL_TABLET | Freq: Once | ORAL | Status: AC
  Administered 2022-02-05: 650 mg via ORAL
  Filled 2022-02-05: qty 2

## 2022-02-05 MED ORDER — PROCHLORPERAZINE EDISYLATE 10 MG/2ML IJ SOLN
10.0000 mg | Freq: Once | INTRAMUSCULAR | Status: AC
Start: 2022-02-05 — End: 2022-02-05
  Administered 2022-02-05: 10 mg via INTRAVENOUS
  Filled 2022-02-05: qty 2

## 2022-02-05 MED ORDER — FUROSEMIDE 10 MG/ML IJ SOLN
40.0000 mg | Freq: Once | INTRAMUSCULAR | Status: AC
  Administered 2022-02-05: 40 mg via INTRAVENOUS
  Filled 2022-02-05: qty 4

## 2022-02-05 NOTE — Progress Notes (Signed)
[facility] for Infectious Disease    Date of Admission:  02/01/2022   Total days of antibiotics 5   ID: Brandy Saunders is a 31 y.o. female with  left leg cellulitis and marked edema Principal Problem:   Cellulitis of left lower extremity Active Problems:   Class 3 severe obesity with serious comorbidity and body mass index (BMI) greater than or equal to 70 in adult [facility])   Asthma without acute exacerbation   Essential hypertension   Headache    Subjective: Afebrile, imaging did not show abscess. She reports slightly less warmth, redness and discomfort  Medications:   vitamin C  1,000 mg Oral Daily   buPROPion  300 mg Oral q AM   chlorthalidone  12.5 mg Oral Daily   cholecalciferol  2,000 Units Oral Daily   clindamycin  600 mg Oral Q8H   enoxaparin (LOVENOX) injection  100 mg Subcutaneous Q24H   furosemide  40 mg Intravenous Once   metoprolol succinate  100 mg Oral Daily    Objective: Vital signs in last 24 hours: Temp:  [98.4 F (36.9 C)] 98.4 F (36.9 C) (06/10 2120) Pulse Rate:  [99] 99 (06/10 2120) Resp:  [18] 18 (06/10 2120) BP: (104)/(56) 104/56 (06/10 2120) SpO2:  [98 %] 98 % (06/10 2120) Physical Exam  Constitutional:  oriented to person, place, and time. appears well-developed and well-nourished. No distress.  HENT: [place]/AT, PERRLA, no scleral icterus Mouth/Throat: Oropharynx is clear and moist. No oropharyngeal exudate.  Cardiovascular: Normal rate, regular rhythm and normal heart sounds. Exam reveals no gallop and no friction rub.  No murmur heard.  Pulmonary/Chest: Effort normal and breath sounds normal. No respiratory distress.  has no wheezes.  Ext: left leg still areas of marked edema to dorsum of foot Skin: Skin is warm and dry. Slightly less erythema Psychiatric: a normal mood and affect.  behavior is normal.    Lab Results Recent Labs    02/04/22 0236 02/05/22 0624  WBC 13.2* 12.5*  HGB 11.7* 11.3*  HCT 36.2 35.0*  NA 133* 136  K  3.6 3.7  CL 100 100  CO2 24 25  BUN 10 12  CREATININE 1.10* 1.04*    Microbiology: reviewed Studies/Results: CT TIBIA FIBULA LEFT W CONTRAST  Result Date: 02/04/2022 CLINICAL DATA:  Cellulitis with no significant improvement despite 3 days of IV antibiotics; Soft tissue mass, foot, US/xray nondiagnostic Cellulitis with no improvement despite IV antibiotic EXAM: CT OF THE LOWER LEFT EXTREMITY WITH CONTRAST CT OF THE LEFT FOOT WITH CONTRAST TECHNIQUE: Multidetector CT imaging of the lower left extremity and left foot was performed according to the standard protocol following intravenous contrast administration. RADIATION DOSE REDUCTION: This exam was performed according to the departmental dose-optimization program which includes automated exposure control, adjustment of the mA and/or kV according to patient size and/or use of iterative reconstruction technique. CONTRAST:  120m OMNIPAQUE IOHEXOL 300 MG/ML  SOLN COMPARISON:  Left tibia and fibula radiographs 02/01/2022. FINDINGS: Bones/Joint/Cartilage There is no acute osseous abnormality involving the tibial or fibula. There is evidence of old avulsion injury at the tip of the lateral malleolus. No acute osseous abnormality in the foot. There is no periostitis or frank bony destruction. Plantar and dorsal calcaneal spurring. Os trigonum. There is no joint effusion. Ligaments Suboptimally assessed by CT. Muscles and Tendons No intramuscular collection.  No acute myotendinous abnormality. Soft tissues There is skin thickening and subcutaneous soft tissue swelling of the left lower extremity through the foot.  There is more confluent fluid accumulating within the anterior proximal lower leg at the level of the patellar tendon and proximal tibia (series 4, image 43, series 7, image 25). No definite organized collection distally in the lower leg or foot. There is some confluent fluid in the region of soft tissue calcification anteriorly in the mid leg and at  the medial malleolus which is ill-defined. No soft tissue gas. IMPRESSION: Skin thickening and subcutaneous soft tissue swelling of the left lower extremity as can be seen in cellulitis or lymphedema. The most confluent fluid identified is accumulating within the anterior soft tissues of the proximal lower leg at the level of the patellar tendon/proximal tibia which could be further assessed with targeted ultrasound if clinically indicated. No other definite organized collection. No bony changes to suggest osteomyelitis by CT. Electronically Signed   By: Maurine Simmering M.D.   On: 02/04/2022 16:46   CT FOOT LEFT W CONTRAST  Result Date: 02/04/2022 CLINICAL DATA:  Cellulitis with no significant improvement despite 3 days of IV antibiotics; Soft tissue mass, foot, US/xray nondiagnostic Cellulitis with no improvement despite IV antibiotic EXAM: CT OF THE LOWER LEFT EXTREMITY WITH CONTRAST CT OF THE LEFT FOOT WITH CONTRAST TECHNIQUE: Multidetector CT imaging of the lower left extremity and left foot was performed according to the standard protocol following intravenous contrast administration. RADIATION DOSE REDUCTION: This exam was performed according to the departmental dose-optimization program which includes automated exposure control, adjustment of the mA and/or kV according to patient size and/or use of iterative reconstruction technique. CONTRAST:  131m OMNIPAQUE IOHEXOL 300 MG/ML  SOLN COMPARISON:  Left tibia and fibula radiographs 02/01/2022. FINDINGS: Bones/Joint/Cartilage There is no acute osseous abnormality involving the tibial or fibula. There is evidence of old avulsion injury at the tip of the lateral malleolus. No acute osseous abnormality in the foot. There is no periostitis or frank bony destruction. Plantar and dorsal calcaneal spurring. Os trigonum. There is no joint effusion. Ligaments Suboptimally assessed by CT. Muscles and Tendons No intramuscular collection.  No acute myotendinous abnormality.  Soft tissues There is skin thickening and subcutaneous soft tissue swelling of the left lower extremity through the foot. There is more confluent fluid accumulating within the anterior proximal lower leg at the level of the patellar tendon and proximal tibia (series 4, image 43, series 7, image 25). No definite organized collection distally in the lower leg or foot. There is some confluent fluid in the region of soft tissue calcification anteriorly in the mid leg and at the medial malleolus which is ill-defined. No soft tissue gas. IMPRESSION: Skin thickening and subcutaneous soft tissue swelling of the left lower extremity as can be seen in cellulitis or lymphedema. The most confluent fluid identified is accumulating within the anterior soft tissues of the proximal lower leg at the level of the patellar tendon/proximal tibia which could be further assessed with targeted ultrasound if clinically indicated. No other definite organized collection. No bony changes to suggest osteomyelitis by CT. Electronically Signed   By: JMaurine SimmeringM.D.   On: 02/04/2022 16:46     Assessment/Plan: Lower extremity swelling = recommend a dose of diuretics to help with decreasing discomfort to lower extremities  Cellulitis = will d/c vancomycin since no purulence but continue on cefazolin plus additional day of clindamycin  Aki= resolved  Leukocytosis = slight decrease.  Therapeutic drug monitoring = vanco dose was adjusted but won't need to follow further vanco trough since discontinuation  I have discussed treatment plan  with dr Shirl Harris [facility] for Infectious Diseases Pager: [phone]  02/05/2022, 11:51 AM

## 2022-02-05 NOTE — Progress Notes (Signed)
  Mobility Specialist Criteria Algorithm Info.    02/05/22 1545  Mobility  HOB Elevated/Bed Position Self regulated  Activity Ambulated independently in room;Ambulated independently to bathroom;Dangled on edge of bed  Range of Motion/Exercises Active;All extremities  Level of Assistance Independent  Assistive Device None  Distance Ambulated (ft) 25 ft  Activity Response Tolerated well   Patient received ambulating in room independently. Was left dangling EOB with all needs met, call bell in reach.   02/05/2022 4:44 PM  [place] Alex Leahy, [place], [place]  XOVAN:[phone] Office: [phone]

## 2022-02-05 NOTE — Progress Notes (Addendum)
PROGRESS NOTE    Brandy Saunders  GLO:[id number] DOB: 1990/12/25 DOA: 02/01/2022 PCP: [facility]    Chief Complaint  Patient presents with   Leg Pain    Brief Narrative:  Brandy Saunders is a 31 y.o. female with medical history significant for asthma, HTN, depression/anxiety, morbid obesity who is admitted with left lower extremity cellulitis.    Assessment & Plan:  Principal Problem:   Cellulitis of left lower extremity Active Problems:   Class 3 severe obesity with serious comorbidity and body mass index (BMI) greater than or equal to 70 in adult [facility])   Asthma without acute exacerbation   Essential hypertension   Headache    Assessment and Plan: * Cellulitis of left lower extremity Left lower extremity cellulitis failing outpatient antibiotics with leukocytosis and fever.  Has been seeing wound care for left knee wound which appears to be healing well. -Noted to have a temp of 100.6 the night of 02/01/2022.  -Currently afebrile.  -Leukocytosis fluctuating. -Plain films which were done were negative for any abscess formation. -Lower extremity Dopplers negative for DVT. -Status post Lasix 20 mg IV x1 02/03/2022 -Still with significant swelling in lower extremity erythema and some tenderness to palpation on 02/04/2022 and as such ID consulted..  --Due to no significant improvement on 02/04/2022 CT of the tib-fib and foot were done which was negative for any abscess formation or osteomyelitis.  -Patient seen in consultation by ID and IV antibiotics narrowed to IV cefazolin and clindamycin added for toxin inhibition. -Continue IV vancomycin, IV cefazolin, oral clindamycin. -We will give a dose of Lasix 40 mg IV x1. -Keep left lower extremity elevated above heart.  -Follow blood cultures. -ID following and appreciate input and recommendations.  Headache - patient complaining of headache today. -Patient states has intermittent headaches. -Compazine 10 mg IV x1.   Tylenol 650 mg x 1. -Supportive care.  Essential hypertension -BP stable.   -Continue chlorthalidone, Toprol-XL.   Asthma without acute exacerbation Continue albuterol as needed.  Class 3 severe obesity with serious comorbidity and body mass index (BMI) greater than or equal to 70 in adult [facility]) - Lifestyle modification. -Outpatient follow-up with PCP.         DVT prophylaxis: Lovenox Code Status: Full Family Communication: Updated patient.  No family at bedside Disposition: Home when clinically improved and transitioned to oral antibiotics and when cleared by ID.  Status is: Inpatient    Consultants:  ID: Dr. Baxter Flattery 02/04/2022  Procedures:  Plain films of the left tib-fib 02/01/2022 Left lower extremity Dopplers 02/01/2022  Antimicrobials:  IV vancomycin 02/01/2022>>>> IV Rocephin 02/02/2022>>> 02/04/2022. IV Ancef 02/04/2022>>>> Oral clindamycin 02/04/2022>>>> 02/06/2022   Subjective: Patient sitting up at the side of the bed.  No chest pain.  States had a bout of coughing after CT scan done yesterday with some associated nausea.  Some improvement with left lower extremity erythema and slight improvement with pain.  Left lower extremity still swollen palpation.  Patient complaining of headache.     Objective: Vitals:   02/03/22 1535 02/04/22 0451 02/04/22 2120 02/05/22 1227  BP: 124/65 (!) 119/42 (!) 104/56 123/63  Pulse: (!) 102 98 99 90  Resp:  20 18   Temp: 98.3 F (36.8 C) 98.8 F (37.1 C) 98.4 F (36.9 C) 97.7 F (36.5 C)  TempSrc: Oral Oral Oral Oral  SpO2: 98% 98% 98% 100%    Intake/Output Summary (Last 24 hours) at 02/05/2022 1637 Last data filed at 02/05/2022 0600  Gross per 24 hour  Intake 100 ml  Output --  Net 100 ml   There were no vitals filed for this visit.  Examination:  General exam: NAD. Respiratory system: Lungs clear to auscultation bilaterally.  No wheezes, no crackles, no rhonchi.  Normal respiratory effort.  Fair air movement.   Speaking in full sentences.  Cardiovascular system: Regular rate rhythm no murmurs rubs or gallops.  No JVD.  Left lower extremity edema. Gastrointestinal system: Abdomen is obese, soft, nontender, nondistended, positive bowel sounds.  No rebound.  No guarding. Central nervous system: Alert and oriented. No focal neurological deficits. Extremities: Left lower extremity with significant edema, decreased erythema, significant tenderness to palpation.  Skin: No rashes, lesions or ulcers Psychiatry: Judgement and insight appear normal. Mood & affect appropriate.     Data Reviewed:   CBC: Recent Labs  Lab 02/01/22 2006 02/02/22 5852 02/03/22 0452 02/04/22 0236 02/05/22 0624  WBC 17.4* 15.1* 9.9 13.2* 12.5*  NEUTROABS 15.0*  --  7.2 10.0* 8.7*  HGB 13.7 12.6 12.1 11.7* 11.3*  HCT 40.7 38.1 36.8 36.2 35.0*  MCV 82.4 85.0 84.4 84.6 83.9  PLT 233 202 236 245 778    Basic Metabolic Panel: Recent Labs  Lab 02/01/22 2006 02/02/22 0311 02/03/22 0452 02/04/22 0236 02/05/22 0624  NA 134* 133* 136 133* 136  K 3.8 3.6 3.9 3.6 3.7  CL 102 102 104 100 100  CO2 21* '23 24 24 25  '$ GLUCOSE 134* 129* 116* 130* 108*  BUN '14 17 17 10 12  '$ CREATININE 0.99 1.44* 1.34* 1.10* 1.04*  CALCIUM 9.1 8.6* 8.5* 8.6* 8.8*    GFR: CrCl cannot be calculated (Unknown ideal weight.).  Liver Function Tests: No results for input(s): "AST", "ALT", "ALKPHOS", "BILITOT", "PROT", "ALBUMIN" in the last 168 hours.  CBG: No results for input(s): "GLUCAP" in the last 168 hours.   Recent Results (from the past 240 hour(s))  Blood culture (routine x 2)     Status: None (Preliminary result)   Collection Time: 02/01/22  8:57 PM   Specimen: BLOOD  Result Value Ref Range Status   Specimen Description BLOOD RIGHT ANTECUBITAL  Final   Special Requests   Final    BOTTLES DRAWN AEROBIC AND ANAEROBIC Blood Culture adequate volume   Culture   Final    NO GROWTH 3 DAYS Performed at [facility], [address] [place], [place] [address]    Report Status PENDING  Incomplete         Radiology Studies: CT TIBIA FIBULA LEFT W CONTRAST  Result Date: 02/04/2022 CLINICAL DATA:  Cellulitis with no significant improvement despite 3 days of IV antibiotics; Soft tissue mass, foot, US/xray nondiagnostic Cellulitis with no improvement despite IV antibiotic EXAM: CT OF THE LOWER LEFT EXTREMITY WITH CONTRAST CT OF THE LEFT FOOT WITH CONTRAST TECHNIQUE: Multidetector CT imaging of the lower left extremity and left foot was performed according to the standard protocol following intravenous contrast administration. RADIATION DOSE REDUCTION: This exam was performed according to the departmental dose-optimization program which includes automated exposure control, adjustment of the mA and/or kV according to patient size and/or use of iterative reconstruction technique. CONTRAST:  151m OMNIPAQUE IOHEXOL 300 MG/ML  SOLN COMPARISON:  Left tibia and fibula radiographs 02/01/2022. FINDINGS: Bones/Joint/Cartilage There is no acute osseous abnormality involving the tibial or fibula. There is evidence of old avulsion injury at the tip of the lateral malleolus. No acute osseous abnormality in the foot. There is no periostitis or  frank bony destruction. Plantar and dorsal calcaneal spurring. Os trigonum. There is no joint effusion. Ligaments Suboptimally assessed by CT. Muscles and Tendons No intramuscular collection.  No acute myotendinous abnormality. Soft tissues There is skin thickening and subcutaneous soft tissue swelling of the left lower extremity through the foot. There is more confluent fluid accumulating within the anterior proximal lower leg at the level of the patellar tendon and proximal tibia (series 4, image 43, series 7, image 25). No definite organized collection distally in the lower leg or foot. There is some confluent fluid in the region of soft tissue calcification anteriorly in the mid leg and at the medial  malleolus which is ill-defined. No soft tissue gas. IMPRESSION: Skin thickening and subcutaneous soft tissue swelling of the left lower extremity as can be seen in cellulitis or lymphedema. The most confluent fluid identified is accumulating within the anterior soft tissues of the proximal lower leg at the level of the patellar tendon/proximal tibia which could be further assessed with targeted ultrasound if clinically indicated. No other definite organized collection. No bony changes to suggest osteomyelitis by CT. Electronically Signed   By: Maurine Simmering M.D.   On: 02/04/2022 16:46   CT FOOT LEFT W CONTRAST  Result Date: 02/04/2022 CLINICAL DATA:  Cellulitis with no significant improvement despite 3 days of IV antibiotics; Soft tissue mass, foot, US/xray nondiagnostic Cellulitis with no improvement despite IV antibiotic EXAM: CT OF THE LOWER LEFT EXTREMITY WITH CONTRAST CT OF THE LEFT FOOT WITH CONTRAST TECHNIQUE: Multidetector CT imaging of the lower left extremity and left foot was performed according to the standard protocol following intravenous contrast administration. RADIATION DOSE REDUCTION: This exam was performed according to the departmental dose-optimization program which includes automated exposure control, adjustment of the mA and/or kV according to patient size and/or use of iterative reconstruction technique. CONTRAST:  188m OMNIPAQUE IOHEXOL 300 MG/ML  SOLN COMPARISON:  Left tibia and fibula radiographs 02/01/2022. FINDINGS: Bones/Joint/Cartilage There is no acute osseous abnormality involving the tibial or fibula. There is evidence of old avulsion injury at the tip of the lateral malleolus. No acute osseous abnormality in the foot. There is no periostitis or frank bony destruction. Plantar and dorsal calcaneal spurring. Os trigonum. There is no joint effusion. Ligaments Suboptimally assessed by CT. Muscles and Tendons No intramuscular collection.  No acute myotendinous abnormality. Soft  tissues There is skin thickening and subcutaneous soft tissue swelling of the left lower extremity through the foot. There is more confluent fluid accumulating within the anterior proximal lower leg at the level of the patellar tendon and proximal tibia (series 4, image 43, series 7, image 25). No definite organized collection distally in the lower leg or foot. There is some confluent fluid in the region of soft tissue calcification anteriorly in the mid leg and at the medial malleolus which is ill-defined. No soft tissue gas. IMPRESSION: Skin thickening and subcutaneous soft tissue swelling of the left lower extremity as can be seen in cellulitis or lymphedema. The most confluent fluid identified is accumulating within the anterior soft tissues of the proximal lower leg at the level of the patellar tendon/proximal tibia which could be further assessed with targeted ultrasound if clinically indicated. No other definite organized collection. No bony changes to suggest osteomyelitis by CT. Electronically Signed   By: JMaurine SimmeringM.D.   On: 02/04/2022 16:46        Scheduled Meds:  vitamin C  1,000 mg Oral Daily   buPROPion  300 mg  Oral q AM   chlorthalidone  12.5 mg Oral Daily   cholecalciferol  2,000 Units Oral Daily   clindamycin  600 mg Oral Q8H   enoxaparin (LOVENOX) injection  100 mg Subcutaneous Q24H   metoprolol succinate  100 mg Oral Daily   Continuous Infusions:   ceFAZolin (ANCEF) IV 2 g (02/05/22 1634)     LOS: 2 days    Time spent: 35 minutes    Irine Seal, MD [facility]   To contact the attending provider between 7A-7P or the covering provider during after hours 7P-7A, please log into the web site [email or web address] and access using universal [place] password for that web site. If you do not have the password, please call the hospital operator.  02/05/2022, 4:37 PM

## 2022-02-05 NOTE — Assessment & Plan Note (Signed)
-   patient with complaints of headache during the hospitalization. -Patient given dose of Compazine and Tylenol with resolution of headache.  -Outpatient follow-up.

## 2022-02-05 NOTE — Progress Notes (Incomplete)
PHARMACY ANTIBIOTIC NOTE  Brandy Saunders a 31 y.o. female admitted on 02/05/2022 with cellulitis. Patient diagnosed with cellulitis 01/31/22 and prescribed doxycycline. Imaging negative for DVT. Pharmacy has been consulted for Vancomycin dosing.  02/05/2022: Scr 0.99 (BL~0.7), WBC 17.4  Vital Signs: Tm 100.6, HR elevated, BP WNL  VR 6/10 13  VT 6/11 AM   Plan: Vancomycin  Monitor renal function, clinical status, de-escalation, C/S, levels as indicated   Allergies:  Allergies  Allergen Reactions   Ibuprofen Diarrhea   Meloxicam Other (See Comments)    Pt stated, "It made my emotions out of wack; it made my left hand numb" Pt stated, "It made my emotions out of wack; it made my left hand numb" Pt stated, "It made my emotions out of wack; it made my left hand numb" Pt stated, "It made my emotions out of wack; it made my left hand numb"     There were no vitals filed for this visit.     Latest Ref Rng & Units 02/05/2022    6:24 AM 02/04/2022    2:36 AM 02/03/2022    4:52 AM  CBC  WBC 4.0 - 10.5 K/uL 12.5  13.2  9.9   Hemoglobin 12.0 - 15.0 g/dL 11.3  11.7  12.1   Hematocrit 36.0 - 46.0 % 35.0  36.2  36.8   Platelets 150 - 400 K/uL 258  245  236    Antimicrobials this admission: Vancomycin 02/05/2022>>   Microbiology results: N/A  Thank you for allowing pharmacy to be a part of this patient's care.  Adria Dill, PharmD PGY-1 Acute Care Resident  02/05/2022 7:30 AM

## 2022-02-06 ENCOUNTER — Other Ambulatory Visit (HOSPITAL_COMMUNITY): Payer: Self-pay

## 2022-02-06 ENCOUNTER — Ambulatory Visit (INDEPENDENT_AMBULATORY_CARE_PROVIDER_SITE_OTHER): Payer: BC Managed Care – PPO | Admitting: Family Medicine

## 2022-02-06 DIAGNOSIS — R519 Headache, unspecified: Secondary | ICD-10-CM

## 2022-02-06 DIAGNOSIS — L03116 Cellulitis of left lower limb: Secondary | ICD-10-CM | POA: Diagnosis not present

## 2022-02-06 LAB — CBC WITH DIFFERENTIAL/PLATELET
Abs Immature Granulocytes: 0.88 10*3/uL — ABNORMAL HIGH (ref 0.00–0.07)
Basophils Absolute: 0.1 10*3/uL (ref 0.0–0.1)
Basophils Relative: 1 %
Eosinophils Absolute: 0.1 10*3/uL (ref 0.0–0.5)
Eosinophils Relative: 1 %
HCT: 36.5 % (ref 36.0–46.0)
Hemoglobin: 11.7 g/dL — ABNORMAL LOW (ref 12.0–15.0)
Immature Granulocytes: 7 %
Lymphocytes Relative: 22 %
Lymphs Abs: 2.7 10*3/uL (ref 0.7–4.0)
MCH: 27.5 pg (ref 26.0–34.0)
MCHC: 32.1 g/dL (ref 30.0–36.0)
MCV: 85.7 fL (ref 80.0–100.0)
Monocytes Absolute: 0.7 10*3/uL (ref 0.1–1.0)
Monocytes Relative: 6 %
Neutro Abs: 7.9 10*3/uL — ABNORMAL HIGH (ref 1.7–7.7)
Neutrophils Relative %: 63 %
Platelets: 280 10*3/uL (ref 150–400)
RBC: 4.26 MIL/uL (ref 3.87–5.11)
RDW: 14.6 % (ref 11.5–15.5)
WBC: 12.5 10*3/uL — ABNORMAL HIGH (ref 4.0–10.5)
nRBC: 0 % (ref 0.0–0.2)

## 2022-02-06 LAB — CULTURE, BLOOD (ROUTINE X 2)
Culture: NO GROWTH
Special Requests: ADEQUATE

## 2022-02-06 LAB — BASIC METABOLIC PANEL
Anion gap: 12 (ref 5–15)
BUN: 13 mg/dL (ref 6–20)
CO2: 24 mmol/L (ref 22–32)
Calcium: 8.6 mg/dL — ABNORMAL LOW (ref 8.9–10.3)
Chloride: 98 mmol/L (ref 98–111)
Creatinine, Ser: 1.09 mg/dL — ABNORMAL HIGH (ref 0.44–1.00)
GFR, Estimated: >60 mL/min (ref >60)
Glucose, Bld: 106 mg/dL — ABNORMAL HIGH (ref 70–99)
Potassium: 3.4 mmol/L — ABNORMAL LOW (ref 3.5–5.1)
Sodium: 134 mmol/L — ABNORMAL LOW (ref 135–145)

## 2022-02-06 MED ORDER — ONDANSETRON HCL 4 MG PO TABS
4.0000 mg | ORAL_TABLET | Freq: Four times a day (QID) | ORAL | 0 refills | Status: AC | PRN
  Filled 2022-02-06: qty 16, 4d supply, fill #0

## 2022-02-06 MED ORDER — SENNOSIDES-DOCUSATE SODIUM 8.6-50 MG PO TABS
1.0000 | ORAL_TABLET | Freq: Two times a day (BID) | ORAL | 0 refills | Status: AC
Start: 2022-02-06 — End: 2022-02-11

## 2022-02-06 MED ORDER — LINEZOLID 600 MG PO TABS
600.0000 mg | ORAL_TABLET | Freq: Two times a day (BID) | ORAL | 0 refills | Status: DC
  Filled 2022-02-06: qty 15, 8d supply, fill #0

## 2022-02-06 MED ORDER — POTASSIUM CHLORIDE CRYS ER 20 MEQ PO TBCR
20.0000 meq | EXTENDED_RELEASE_TABLET | Freq: Every day | ORAL | 0 refills | Status: AC
  Filled 2022-02-06: qty 3, 3d supply, fill #0

## 2022-02-06 MED ORDER — BIOTIN 5 MG PO CAPS: 10.0000 mg | ORAL_CAPSULE | Freq: Every day | ORAL | Status: AC

## 2022-02-06 MED ORDER — FUROSEMIDE 40 MG PO TABS
40.0000 mg | ORAL_TABLET | Freq: Every day | ORAL | 0 refills | Status: AC
  Filled 2022-02-06: qty 3, 3d supply, fill #0

## 2022-02-06 MED ORDER — OXYCODONE-ACETAMINOPHEN 5-325 MG PO TABS
1.0000 | ORAL_TABLET | ORAL | 0 refills | Status: AC | PRN
Start: 2022-02-06 — End: ?
  Filled 2022-02-06: qty 12, 2d supply, fill #0

## 2022-02-06 MED ORDER — POTASSIUM CHLORIDE CRYS ER 20 MEQ PO TBCR
40.0000 meq | EXTENDED_RELEASE_TABLET | Freq: Once | ORAL | Status: AC
  Administered 2022-02-06: 40 meq via ORAL
  Filled 2022-02-06: qty 2

## 2022-02-06 NOTE — Progress Notes (Signed)
[facility] for Infectious Disease    Date of Admission:  02/01/2022   Total left leg cellulitis and lymphedema Principal Problem:   Cellulitis of left lower extremity Active Problems:   Class 3 severe obesity with serious comorbidity and body mass index (BMI) greater than or equal to 70 in adult [facility])   Asthma without acute exacerbation   Essential hypertension   Headache    Subjective: Improvement in redness and swelling. No fevers  Medications:   vitamin C  1,000 mg Oral Daily   buPROPion  300 mg Oral q AM   chlorthalidone  12.5 mg Oral Daily   cholecalciferol  2,000 Units Oral Daily   clindamycin  600 mg Oral Q8H   enoxaparin (LOVENOX) injection  100 mg Subcutaneous Q24H   metoprolol succinate  100 mg Oral Daily    Objective: Vital signs in last 24 hours: Temp:  [97.7 F (36.5 C)-98.5 F (36.9 C)] 98.2 F (36.8 C) (06/12 0810) Pulse Rate:  [89-94] 89 (06/12 0810) Resp:  [19] 19 (06/11 2148) BP: (96-123)/(48-63) 96/48 (06/12 0810) SpO2:  [95 %-100 %] 95 % (06/12 0810) Weight:  [201.4 kg] 201.4 kg (06/12 0600)  Physical Exam  Constitutional:  oriented to person, place, and time. appears well-developed and well-nourished. No distress.  HENT: [place]/AT, PERRLA, no scleral icterus Mouth/Throat: Oropharynx is clear and moist. No oropharyngeal exudate.  VOZ:DGUY swelling, less tense decreased areas of involvement Skin: Skin is warm and dry. No rash noted. No erythema.  Psychiatric: a normal mood and affect.  behavior is normal.    Lab Results Recent Labs    02/05/22 0624 02/06/22 0249  WBC 12.5* 12.5*  HGB 11.3* 11.7*  HCT 35.0* 36.5  NA 136 134*  K 3.7 3.4*  CL 100 98  CO2 25 24  BUN 12 13  CREATININE 1.04* 1.09*    Studies/Results: CT TIBIA FIBULA LEFT W CONTRAST  Result Date: 02/04/2022 CLINICAL DATA:  Cellulitis with no significant improvement despite 3 days of IV antibiotics; Soft tissue mass, foot, US/xray nondiagnostic Cellulitis with no  improvement despite IV antibiotic EXAM: CT OF THE LOWER LEFT EXTREMITY WITH CONTRAST CT OF THE LEFT FOOT WITH CONTRAST TECHNIQUE: Multidetector CT imaging of the lower left extremity and left foot was performed according to the standard protocol following intravenous contrast administration. RADIATION DOSE REDUCTION: This exam was performed according to the departmental dose-optimization program which includes automated exposure control, adjustment of the mA and/or kV according to patient size and/or use of iterative reconstruction technique. CONTRAST:  158m OMNIPAQUE IOHEXOL 300 MG/ML  SOLN COMPARISON:  Left tibia and fibula radiographs 02/01/2022. FINDINGS: Bones/Joint/Cartilage There is no acute osseous abnormality involving the tibial or fibula. There is evidence of old avulsion injury at the tip of the lateral malleolus. No acute osseous abnormality in the foot. There is no periostitis or frank bony destruction. Plantar and dorsal calcaneal spurring. Os trigonum. There is no joint effusion. Ligaments Suboptimally assessed by CT. Muscles and Tendons No intramuscular collection.  No acute myotendinous abnormality. Soft tissues There is skin thickening and subcutaneous soft tissue swelling of the left lower extremity through the foot. There is more confluent fluid accumulating within the anterior proximal lower leg at the level of the patellar tendon and proximal tibia (series 4, image 43, series 7, image 25). No definite organized collection distally in the lower leg or foot. There is some confluent fluid in the region of soft tissue calcification anteriorly in the mid leg and at  the medial malleolus which is ill-defined. No soft tissue gas. IMPRESSION: Skin thickening and subcutaneous soft tissue swelling of the left lower extremity as can be seen in cellulitis or lymphedema. The most confluent fluid identified is accumulating within the anterior soft tissues of the proximal lower leg at the level of the  patellar tendon/proximal tibia which could be further assessed with targeted ultrasound if clinically indicated. No other definite organized collection. No bony changes to suggest osteomyelitis by CT. Electronically Signed   By: Maurine Simmering M.D.   On: 02/04/2022 16:46   CT FOOT LEFT W CONTRAST  Result Date: 02/04/2022 CLINICAL DATA:  Cellulitis with no significant improvement despite 3 days of IV antibiotics; Soft tissue mass, foot, US/xray nondiagnostic Cellulitis with no improvement despite IV antibiotic EXAM: CT OF THE LOWER LEFT EXTREMITY WITH CONTRAST CT OF THE LEFT FOOT WITH CONTRAST TECHNIQUE: Multidetector CT imaging of the lower left extremity and left foot was performed according to the standard protocol following intravenous contrast administration. RADIATION DOSE REDUCTION: This exam was performed according to the departmental dose-optimization program which includes automated exposure control, adjustment of the mA and/or kV according to patient size and/or use of iterative reconstruction technique. CONTRAST:  172m OMNIPAQUE IOHEXOL 300 MG/ML  SOLN COMPARISON:  Left tibia and fibula radiographs 02/01/2022. FINDINGS: Bones/Joint/Cartilage There is no acute osseous abnormality involving the tibial or fibula. There is evidence of old avulsion injury at the tip of the lateral malleolus. No acute osseous abnormality in the foot. There is no periostitis or frank bony destruction. Plantar and dorsal calcaneal spurring. Os trigonum. There is no joint effusion. Ligaments Suboptimally assessed by CT. Muscles and Tendons No intramuscular collection.  No acute myotendinous abnormality. Soft tissues There is skin thickening and subcutaneous soft tissue swelling of the left lower extremity through the foot. There is more confluent fluid accumulating within the anterior proximal lower leg at the level of the patellar tendon and proximal tibia (series 4, image 43, series 7, image 25). No definite organized  collection distally in the lower leg or foot. There is some confluent fluid in the region of soft tissue calcification anteriorly in the mid leg and at the medial malleolus which is ill-defined. No soft tissue gas. IMPRESSION: Skin thickening and subcutaneous soft tissue swelling of the left lower extremity as can be seen in cellulitis or lymphedema. The most confluent fluid identified is accumulating within the anterior soft tissues of the proximal lower leg at the level of the patellar tendon/proximal tibia which could be further assessed with targeted ultrasound if clinically indicated. No other definite organized collection. No bony changes to suggest osteomyelitis by CT. Electronically Signed   By: JMaurine SimmeringM.D.   On: 02/04/2022 16:46     Assessment/Plan: Cellulitis = recommend to continue on iv cefazolin 2gm IV Q 8hr. Recommend at discharge to give 7 days of linezolid '600mg'$  po bid  In reviewing use of linezolid with wellbutrin, it has very low risk of interaction. Would still go ahead and treat with linezolid  Chronic lymphedema = recommend to continue with leg raised. May benefit from short course of lasix x  3 days   Will sign off.  [facility] Pager: [phone]  02/06/2022, 12:17 PM

## 2022-02-06 NOTE — TOC Benefit Eligibility Note (Signed)
Patient Advocate Encounter  Prior Authorization for Linezolid 600 mg has been approved.    PA AYT:[id number] PA#  [id number] Effective dates: 02/06/2022 through 03/06/2022  Patients co-pay is $10.

## 2022-02-06 NOTE — Progress Notes (Signed)
Mobility Specialist Progress Note   02/06/22 1658  Mobility  Activity Ambulated independently in hallway  Level of Assistance Independent  Assistive Device None  Distance Ambulated (ft) 356 ft  Activity Response Tolerated well  $Mobility charge 1 Mobility   Pt received in chair and agreeable to mobility. C/o L calf tightness w/ a pain they rated 5/10, as well as "pulling sensation" during mobility but ambulated w/o fault. Pt back to chair after session with all needs met.   Holland Falling Mobility Specialist Phone Number [phone]

## 2022-02-06 NOTE — Discharge Summary (Signed)
Physician Discharge Summary  Brandy Saunders YQI:[id number] DOB: 01/13/1991 DOA: 02/01/2022  PCP: [facility] date: 02/01/2022 Discharge date: 02/06/2022  Time spent: 55 minutes  Recommendations for Outpatient Follow-up:  Follow-up with Brandy Saunders. in 1 to 2 weeks.  On follow-up patient need a basic metabolic profile done to follow-up on electrolytes and renal function.  Patient will need a CBC done to follow-up on leukocytosis.  Lower extremity cellulitis will need to be reassessed on follow-up.   Discharge Diagnoses:  Principal Problem:   Cellulitis of left lower extremity Active Problems:   Class 3 severe obesity with serious comorbidity and body mass index (BMI) greater than or equal to 70 in adult [facility])   Asthma without acute exacerbation   Essential hypertension   Headache   Discharge Condition: Stable and improved  Diet recommendation: Heart healthy  Filed Weights   02/06/22 0600  Weight: (!) 201.4 kg    History of present illness:  HPI per Brandy Saunders is a 31 y.o. female with medical history significant for asthma, HTN, depression/anxiety, morbid obesity who presented to the ED for evaluation of left lower extremity cellulitis.  Patient has been following with wound care for left knee open wound which initially occurred 11/16/2021 after a fall.  This has been healing well with topical treatments and intermittent debridement.  She has not seen any further discharge from the area or swelling at her knee.  On 6/6 she noticed new erythema at the proximal tibial area and distal LLE above the ankle.  She went to the ED and was prescribed doxycycline for which she has taken 1 days worth so far.  She has been having fevers up to 104 F at home, chills, diaphoresis, nausea, and malaise.  Due to persistent symptoms she came to the ED for further evaluation.   ED Course  Labs/Imaging on admission: I have personally reviewed  following labs and imaging studies.   Initial vitals showed BP 118/64, pulse 108, RR 20, temp 99.0 F, SPO2 97% on room air.  Tmax 100.6 F.   Labs show WBC 17.4, hemoglobin 13.7, platelets 233,000, sodium 134, potassium 3.8, bicarb 21, BUN 14, creatinine 0.99, serum glucose 134, i-STAT beta-hCG <5.0.  Blood cultures ordered and pending.   Left tibia/fibula x-ray shows mild diffuse subcutaneous edema without soft tissue gas.  LLE venous ultrasound negative for evidence of DVT.   Patient was given IV vancomycin and Toradol.  The hospitalist service was consulted to admit for further evaluation and management.   Hospital Course:   Assessment and Plan: * Cellulitis of left lower extremity Left lower extremity cellulitis failing outpatient antibiotics with leukocytosis and fever.  Has been seeing wound care for left knee wound which appears to be healing well. -Noted to have a temp of 100.6 the night of 02/01/2022.  -Patient patient remained afebrile throughout the hospitalization -Leukocytosis trending down during hospitalization. -Plain films which were done were negative for any abscess formation. -Lower extremity Dopplers negative for DVT. -Status post Lasix 20 mg IV x1 02/03/2022 -Still with significant swelling in lower extremity erythema and some tenderness to palpation on 02/04/2022 and as such ID consulted..  --Due to no significant improvement on 02/04/2022 CT of the tib-fib and foot were done which was negative for any abscess formation or osteomyelitis.  -Patient seen in consultation by ID and IV antibiotics narrowed to IV cefazolin and clindamycin added for toxin inhibition. -IV vancomycin subsequently discontinued and patient  maintained on IV cefazolin and oral clindamycin.   -Patient received a dose of IV Lasix during the hospitalization.  -ID recommended 7-day course of Zyvox in addition to 3 days of Lasix which patient will be discharged home on.   -Patient to remain off her feet  for about 5 to 7 days per ID recommendations.   -Outpatient follow-up with PCP.   Headache - patient with complaints of headache during the hospitalization. -Patient given dose of Compazine and Tylenol with resolution of headache.  -Outpatient follow-up.    Essential hypertension -BP stable.   -Patient maintained on home regimen chlorthalidone and Toprol-XL.   Asthma without acute exacerbation Patient maintained on home regimen albuterol as needed.  Class 3 severe obesity with serious comorbidity and body mass index (BMI) greater than or equal to 70 in adult [facility]) - Lifestyle modification. -Outpatient follow-up with PCP.        Procedures: Plain films of the left tib-fib 02/01/2022 Left lower extremity Dopplers 02/01/2022  Consultations: ID: Brandy Saunders 02/04/2022    Discharge Exam: Vitals:   02/06/22 1524 02/06/22 1524  BP: (!) 106/33 (!) 106/33  Pulse: 88 92  Resp:    Temp: 97.8 F (36.6 C) 97.8 F (36.6 C)  SpO2: 98% 99%    General: NAD Cardiovascular: Regular rate rhythm no murmurs rubs or gallops.  No JVD.  No lower extremity edema. Respiratory: Clear to auscultation bilaterally.  No wheezes, no crackles, no rhonchi.  Fair air movement.  Speaking in full sentences.  Discharge Instructions   Discharge Instructions     Diet - low sodium heart healthy   Complete by: As directed    Discharge instructions   Complete by: As directed    Stay off feet for 5 to 7 days. Keep left upper extremity elevated above the heart when sitting or laying down.   Increase activity slowly   Complete by: As directed       Allergies as of 02/06/2022       Reactions   Ibuprofen Diarrhea   Meloxicam Other (See Comments)   Pt stated, "It made my emotions out of wack; it made my left hand numb" Pt stated, "It made my emotions out of wack; it made my left hand numb" Pt stated, "It made my emotions out of wack; it made my left hand numb" Pt stated, "It made my emotions out of  wack; it made my left hand numb"        Medication List     STOP taking these medications    Alvesco 160 MCG/ACT inhaler Generic drug: ciclesonide   doxycycline 100 MG capsule Commonly known as: VIBRAMYCIN       TAKE these medications    acetaminophen 500 MG tablet Commonly known as: TYLENOL Take 1,000 mg by mouth every 6 (six) hours as needed for moderate pain or headache.   albuterol 108 (90 Base) MCG/ACT inhaler Commonly known as: VENTOLIN HFA Inhale 2 puffs into the lungs every 4 (four) hours as needed for wheezing or shortness of breath.   Biotin 5 MG Caps Commonly known as: Biotin 5000 Take 2 capsules (10 mg total) by mouth daily. What changed:  medication strength how much to take   buPROPion 300 MG 24 hr tablet Commonly known as: WELLBUTRIN XL Take 1 tablet (300 mg total) by mouth in the morning.   chlorthalidone 25 MG tablet Commonly known as: HYGROTON TAKE 1/2 TABLET BY MOUTH EVERY DAY   furosemide 40 MG tablet Commonly known  as: Lasix Take 1 tablet (40 mg total) by mouth daily for 3 days.   linezolid 600 MG tablet Commonly known as: ZYVOX Take 1 tablet (600 mg total) by mouth 2 (two) times daily.   medroxyPROGESTERone 10 MG tablet Commonly known as: PROVERA Take 10 mg by mouth See admin instructions. Every 3 months to bring on menstrual cycle   metoprolol succinate 100 MG 24 hr tablet Commonly known as: TOPROL-XL Take 1 tablet (100 mg total) by mouth daily. Take with or immediately following a meal.   ondansetron 4 MG tablet Commonly known as: ZOFRAN Take 1 tablet (4 mg total) by mouth every 6 (six) hours as needed for nausea.   ONE-A-DAY WOMENS PO Take 1 tablet by mouth daily.   oxyCODONE-acetaminophen 5-325 MG tablet Commonly known as: PERCOCET/ROXICET Take 1 tablet by mouth every 4 (four) hours as needed for moderate pain.   polyethylene glycol 17 g packet Commonly known as: MIRALAX / GLYCOLAX Take 17 g by mouth daily as needed  (constipation).   potassium chloride SA 20 MEQ tablet Commonly known as: KLOR-CON M Take 1 tablet (20 mEq total) by mouth daily for 3 days.   senna-docusate 8.6-50 MG tablet Commonly known as: Senokot-S Take 1 tablet by mouth 2 (two) times daily for 5 days.   VITAMIN B 12 PO Take 2,500 mcg by mouth daily.   vitamin C 1000 MG tablet Take 1,000 mg by mouth daily.   Vitamin D (Ergocalciferol) 1.25 MG (50000 UNIT) Caps capsule Commonly known as: DRISDOL Take one po weekly What changed:  how much to take how to take this when to take this additional instructions   Vitamin D 50 MCG (2000 UT) Caps Take 1 capsule (2,000 Units total) by mouth daily.   Wegovy 1 MG/0.5ML Soaj Generic drug: Semaglutide-Weight Management Inject 1 mg into the skin once a week. What changed: when to take this       Allergies  Allergen Reactions   Ibuprofen Diarrhea   Meloxicam Other (See Comments)    Pt stated, "It made my emotions out of wack; it made my left hand numb" Pt stated, "It made my emotions out of wack; it made my left hand numb" Pt stated, "It made my emotions out of wack; it made my left hand numb" Pt stated, "It made my emotions out of wack; it made my left hand numb"     Follow-up [place].. Schedule an appointment as soon as possible for a visit in 1 week(s).   Why: f/u in 1-2 weeks Contact information: [address] [place] [address] [phone]                  The results of significant diagnostics from this hospitalization (including imaging, microbiology, ancillary and laboratory) are listed below for reference.    Significant Diagnostic Studies: CT TIBIA FIBULA LEFT W CONTRAST  Result Date: 02/04/2022 CLINICAL DATA:  Cellulitis with no significant improvement despite 3 days of IV antibiotics; Soft tissue mass, foot, US/xray nondiagnostic Cellulitis with no improvement despite IV antibiotic EXAM: CT OF THE  LOWER LEFT EXTREMITY WITH CONTRAST CT OF THE LEFT FOOT WITH CONTRAST TECHNIQUE: Multidetector CT imaging of the lower left extremity and left foot was performed according to the standard protocol following intravenous contrast administration. RADIATION DOSE REDUCTION: This exam was performed according to the departmental dose-optimization program which includes automated exposure control, adjustment of the mA and/or kV according to patient size  and/or use of iterative reconstruction technique. CONTRAST:  174m OMNIPAQUE IOHEXOL 300 MG/ML  SOLN COMPARISON:  Left tibia and fibula radiographs 02/01/2022. FINDINGS: Bones/Joint/Cartilage There is no acute osseous abnormality involving the tibial or fibula. There is evidence of old avulsion injury at the tip of the lateral malleolus. No acute osseous abnormality in the foot. There is no periostitis or frank bony destruction. Plantar and dorsal calcaneal spurring. Os trigonum. There is no joint effusion. Ligaments Suboptimally assessed by CT. Muscles and Tendons No intramuscular collection.  No acute myotendinous abnormality. Soft tissues There is skin thickening and subcutaneous soft tissue swelling of the left lower extremity through the foot. There is more confluent fluid accumulating within the anterior proximal lower leg at the level of the patellar tendon and proximal tibia (series 4, image 43, series 7, image 25). No definite organized collection distally in the lower leg or foot. There is some confluent fluid in the region of soft tissue calcification anteriorly in the mid leg and at the medial malleolus which is ill-defined. No soft tissue gas. IMPRESSION: Skin thickening and subcutaneous soft tissue swelling of the left lower extremity as can be seen in cellulitis or lymphedema. The most confluent fluid identified is accumulating within the anterior soft tissues of the proximal lower leg at the level of the patellar tendon/proximal tibia which could be further  assessed with targeted ultrasound if clinically indicated. No other definite organized collection. No bony changes to suggest osteomyelitis by CT. Electronically Signed   By: JMaurine SimmeringM.D.   On: 02/04/2022 16:46   CT FOOT LEFT W CONTRAST  Result Date: 02/04/2022 CLINICAL DATA:  Cellulitis with no significant improvement despite 3 days of IV antibiotics; Soft tissue mass, foot, US/xray nondiagnostic Cellulitis with no improvement despite IV antibiotic EXAM: CT OF THE LOWER LEFT EXTREMITY WITH CONTRAST CT OF THE LEFT FOOT WITH CONTRAST TECHNIQUE: Multidetector CT imaging of the lower left extremity and left foot was performed according to the standard protocol following intravenous contrast administration. RADIATION DOSE REDUCTION: This exam was performed according to the departmental dose-optimization program which includes automated exposure control, adjustment of the mA and/or kV according to patient size and/or use of iterative reconstruction technique. CONTRAST:  1074mOMNIPAQUE IOHEXOL 300 MG/ML  SOLN COMPARISON:  Left tibia and fibula radiographs 02/01/2022. FINDINGS: Bones/Joint/Cartilage There is no acute osseous abnormality involving the tibial or fibula. There is evidence of old avulsion injury at the tip of the lateral malleolus. No acute osseous abnormality in the foot. There is no periostitis or frank bony destruction. Plantar and dorsal calcaneal spurring. Os trigonum. There is no joint effusion. Ligaments Suboptimally assessed by CT. Muscles and Tendons No intramuscular collection.  No acute myotendinous abnormality. Soft tissues There is skin thickening and subcutaneous soft tissue swelling of the left lower extremity through the foot. There is more confluent fluid accumulating within the anterior proximal lower leg at the level of the patellar tendon and proximal tibia (series 4, image 43, series 7, image 25). No definite organized collection distally in the lower leg or foot. There is some  confluent fluid in the region of soft tissue calcification anteriorly in the mid leg and at the medial malleolus which is ill-defined. No soft tissue gas. IMPRESSION: Skin thickening and subcutaneous soft tissue swelling of the left lower extremity as can be seen in cellulitis or lymphedema. The most confluent fluid identified is accumulating within the anterior soft tissues of the proximal lower leg at the level of the patellar tendon/proximal  tibia which could be further assessed with targeted ultrasound if clinically indicated. No other definite organized collection. No bony changes to suggest osteomyelitis by CT. Electronically Signed   By: Maurine Simmering M.D.   On: 02/04/2022 16:46   VAS [place] LOWER EXTREMITY VENOUS (DVT) (7a-7p)  Result Date: 02/01/2022  Lower Venous DVT Study Patient Name:  RAECHAL RABEN  Date of Exam:   02/01/2022 Medical Rec #: [id number]          Accession #:    [id number] Date of Birth: 1990-12-16           Patient Gender: F Patient Age:   30 years Exam Location:  [facility] Procedure:      VAS [place] LOWER EXTREMITY VENOUS (DVT) Referring Phys: HALEY SAGE --------------------------------------------------------------------------------  Indications: Pain & Erythema - patient started on antibiotics yesterday for LLE cellulitis.  Limitations: Body habitus and poor ultrasound/tissue interface. Comparison Study: Previous LLEV exam on 12/13/17 was negative for DVT. Performing Technologist: Rogelia Rohrer RVT, RDMS  Examination Guidelines: A complete evaluation includes B-mode imaging, spectral Doppler, color Doppler, and power Doppler as needed of all accessible portions of each vessel. Bilateral testing is considered an integral part of a complete examination. Limited examinations for reoccurring indications may be performed as noted. The reflux portion of the exam is performed with the patient in reverse Trendelenburg.  +-----+---------------+---------+-----------+----------+--------------+  RIGHTCompressibilityPhasicitySpontaneityPropertiesThrombus Aging +-----+---------------+---------+-----------+----------+--------------+ CFV  Full           Yes      Yes                                 +-----+---------------+---------+-----------+----------+--------------+   +---------+---------------+---------+-----------+----------+--------------+ LEFT     CompressibilityPhasicitySpontaneityPropertiesThrombus Aging +---------+---------------+---------+-----------+----------+--------------+ CFV      Full           Yes      Yes                                 +---------+---------------+---------+-----------+----------+--------------+ SFJ      Full                                                        +---------+---------------+---------+-----------+----------+--------------+ FV Prox  Full           Yes      Yes                                 +---------+---------------+---------+-----------+----------+--------------+ FV Mid   Full           Yes      Yes                                 +---------+---------------+---------+-----------+----------+--------------+ FV DistalFull           Yes      Yes                                 +---------+---------------+---------+-----------+----------+--------------+ PFV      Full                                                        +---------+---------------+---------+-----------+----------+--------------+  POP      Full           Yes      Yes                                 +---------+---------------+---------+-----------+----------+--------------+ PTV      Full                                                        +---------+---------------+---------+-----------+----------+--------------+ PERO     Full                                                        +---------+---------------+---------+-----------+----------+--------------+     Summary: RIGHT: - No evidence of common femoral vein  obstruction.  LEFT: - There is no evidence of deep vein thrombosis in the lower extremity.  - No cystic structure found in the popliteal fossa.  *See table(s) above for measurements and observations. Electronically signed by Harold Barban MD on 02/01/2022 at 10:54:59 PM.    Final    DG Tibia/Fibula Left  Result Date: 02/01/2022 CLINICAL DATA:  Rule out soft tissue gas. EXAM: LEFT TIBIA AND FIBULA - 2 VIEW COMPARISON:  Left hip radiograph dated 10/08/2019. FINDINGS: There is no acute fracture or dislocation. The bones are well mineralized. No significant arthritic changes. Mild diffuse subcutaneous edema. No radiopaque foreign object or soft tissue gas. IMPRESSION: Mild diffuse subcutaneous edema. No soft tissue gas. Electronically Signed   By: Anner Crete M.D.   On: 02/01/2022 20:54    Microbiology: Recent Results (from the past 240 hour(s))  Blood culture (routine x 2)     Status: None   Collection Time: 02/01/22  8:57 PM   Specimen: BLOOD  Result Value Ref Range Status   Specimen Description BLOOD RIGHT ANTECUBITAL  Final   Special Requests   Final    BOTTLES DRAWN AEROBIC AND ANAEROBIC Blood Culture adequate volume   Culture   Final    NO GROWTH 5 DAYS Performed at [facility], [address], [place], [place] [address]    Report Status 02/06/2022 FINAL  Final     Labs: Basic Metabolic Panel: Recent Labs  Lab 02/02/22 0311 02/03/22 0452 02/04/22 0236 02/05/22 0624 02/06/22 0249  NA 133* 136 133* 136 134*  K 3.6 3.9 3.6 3.7 3.4*  CL 102 104 100 100 98  CO2 '23 24 24 25 24  '$ GLUCOSE 129* 116* 130* 108* 106*  BUN '17 17 10 12 13  '$ CREATININE 1.44* 1.34* 1.10* 1.04* 1.09*  CALCIUM 8.6* 8.5* 8.6* 8.8* 8.6*   Liver Function Tests: No results for input(s): "AST", "ALT", "ALKPHOS", "BILITOT", "PROT", "ALBUMIN" in the last 168 hours. No results for input(s): "LIPASE", "AMYLASE" in the last 168 hours. No results for input(s): "AMMONIA" in the last 168 hours. CBC: Recent  Labs  Lab 02/01/22 2006 02/02/22 0311 02/03/22 0452 02/04/22 0236 02/05/22 0624 02/06/22 0249  WBC 17.4* 15.1* 9.9 13.2* 12.5* 12.5*  NEUTROABS 15.0*  --  7.2 10.0* 8.7* 7.9*  HGB 13.7 12.6 12.1 11.7* 11.3* 11.7*  HCT 40.7  38.1 36.8 36.2 35.0* 36.5  MCV 82.4 85.0 84.4 84.6 83.9 85.7  PLT 233 202 236 245 258 280   Cardiac Enzymes: No results for input(s): "CKTOTAL", "CKMB", "CKMBINDEX", "TROPONINI" in the last 168 hours. BNP: BNP (last 3 results) No results for input(s): "BNP" in the last 8760 hours.  ProBNP (last 3 results) No results for input(s): "PROBNP" in the last 8760 hours.  CBG: No results for input(s): "GLUCAP" in the last 168 hours.     Signed:  Irine Seal MD.  [facility] 02/06/2022, 3:56 PM

## 2022-02-09 ENCOUNTER — Encounter (INDEPENDENT_AMBULATORY_CARE_PROVIDER_SITE_OTHER): Payer: Self-pay | Admitting: Family Medicine

## 2022-02-09 ENCOUNTER — Other Ambulatory Visit (INDEPENDENT_AMBULATORY_CARE_PROVIDER_SITE_OTHER): Payer: Self-pay | Admitting: Family Medicine

## 2022-02-09 DIAGNOSIS — I10 Essential (primary) hypertension: Secondary | ICD-10-CM

## 2022-02-09 DIAGNOSIS — E559 Vitamin D deficiency, unspecified: Secondary | ICD-10-CM

## 2022-02-09 DIAGNOSIS — F3289 Other specified depressive episodes: Secondary | ICD-10-CM

## 2022-02-09 NOTE — Telephone Encounter (Signed)
Dr.Beasley 

## 2022-02-13 DIAGNOSIS — L98491 Non-pressure chronic ulcer of skin of other sites limited to breakdown of skin: Secondary | ICD-10-CM | POA: Diagnosis not present

## 2022-02-13 DIAGNOSIS — R2240 Localized swelling, mass and lump, unspecified lower limb: Secondary | ICD-10-CM | POA: Diagnosis not present

## 2022-02-13 DIAGNOSIS — R21 Rash and other nonspecific skin eruption: Secondary | ICD-10-CM | POA: Diagnosis not present

## 2022-02-13 DIAGNOSIS — L03116 Cellulitis of left lower limb: Secondary | ICD-10-CM | POA: Diagnosis not present

## 2022-02-13 NOTE — Telephone Encounter (Signed)
Patient was sent a Mychart message to schedule an appointment. Patient has been scheduled to see Dr. Leafy Ro tomorrow, refills will be discussed at her visit.

## 2022-02-14 ENCOUNTER — Encounter (INDEPENDENT_AMBULATORY_CARE_PROVIDER_SITE_OTHER): Payer: Self-pay | Admitting: Family Medicine

## 2022-02-14 ENCOUNTER — Ambulatory Visit (INDEPENDENT_AMBULATORY_CARE_PROVIDER_SITE_OTHER): Payer: BC Managed Care – PPO | Admitting: Family Medicine

## 2022-02-14 VITALS — BP 100/66 | HR 81 | Temp 98.1°F | Ht 64.0 in | Wt >= 6400 oz

## 2022-02-14 DIAGNOSIS — Z6841 Body Mass Index (BMI) 40.0 and over, adult: Secondary | ICD-10-CM

## 2022-02-14 DIAGNOSIS — E559 Vitamin D deficiency, unspecified: Secondary | ICD-10-CM | POA: Diagnosis not present

## 2022-02-14 DIAGNOSIS — I10 Essential (primary) hypertension: Secondary | ICD-10-CM | POA: Diagnosis not present

## 2022-02-14 DIAGNOSIS — F3289 Other specified depressive episodes: Secondary | ICD-10-CM

## 2022-02-14 DIAGNOSIS — E669 Obesity, unspecified: Secondary | ICD-10-CM | POA: Diagnosis not present

## 2022-02-14 DIAGNOSIS — E66813 Obesity, class 3: Secondary | ICD-10-CM

## 2022-02-14 MED ORDER — BUPROPION HCL ER (XL) 300 MG PO TB24: 300.0000 mg | ORAL_TABLET | Freq: Every morning | ORAL | 0 refills | Status: DC

## 2022-02-14 MED ORDER — METOPROLOL SUCCINATE ER 100 MG PO TB24: 100.0000 mg | ORAL_TABLET | Freq: Every day | ORAL | 0 refills | Status: DC

## 2022-02-14 MED ORDER — VITAMIN D (ERGOCALCIFEROL) 1.25 MG (50000 UNIT) PO CAPS: 50000.0000 [IU] | ORAL_CAPSULE | ORAL | 0 refills | Status: DC

## 2022-02-15 NOTE — Progress Notes (Signed)
Chief Complaint:   OBESITY Brandy Saunders is here to discuss her progress with her obesity treatment plan along with follow-up of her obesity related diagnoses. Brandy Saunders is on practicing portion control and making smarter food choices, such as increasing vegetables and decreasing simple carbohydrates and states she is following her eating plan approximately 50% of the time. Brandy Saunders states she is doing 0 minutes 0 times per week.  Today's visit was #: 33 Starting weight: 447 lbs Starting date: 10/11/2017 Today's weight: 435 lbs Today's date: 02/14/2022 Total lbs lost to date: 12 Total lbs lost since last in-office visit: 7  Interim History: Brandy Saunders continues to do well with weight loss.  She was hospitalized recently.  She has not been eating as much and she is trying to portion control and make smarter choices.  Subjective:   1. Vitamin D deficiency Brandy Saunders's last vitamin D level was at goal.  She is due to have her labs rechecked soon.  2. Essential hypertension Brandy Saunders's blood pressure is stable on her medications.  She is working on weight loss and she needs a refill today.  3. Other depression with emotional eating Brandy Saunders is on Wellbutrin and has had increased stress recently.  She requested a 90-day refill.  Assessment/Plan:   1. Vitamin D deficiency We will refill prescription Vitamin D for 1 month. Kyisha will follow-up for routine testing of Vitamin D, at least 2-3 times per year to avoid over-replacement.  - Vitamin D, Ergocalciferol, (DRISDOL) 1.25 MG (50000 UNIT) CAPS capsule; Take 1 capsule (50,000 Units total) by mouth every Sunday.  Dispense: 4 capsule; Refill: 0  2. Essential hypertension Brandy Saunders will continue her medications, and we will refill metoprolol for 1 month.  - metoprolol succinate (TOPROL-XL) 100 MG 24 hr tablet; Take 1 tablet (100 mg total) by mouth daily. Take with or immediately following a meal.  Dispense: 30 tablet; Refill: 0  3. Other  depression with emotional eating Brandy Saunders will continue Wellbutrin XL, and we will refill for 90 days.  - buPROPion (WELLBUTRIN XL) 300 MG 24 hr tablet; Take 1 tablet (300 mg total) by mouth in the morning.  Dispense: 90 tablet; Refill: 0  4. Obesity, Current BMI 74.7 Brandy Saunders is currently in the action stage of change. As such, her goal is to continue with weight loss efforts. She has agreed to practicing portion control and making smarter food choices, such as increasing vegetables and decreasing simple carbohydrates.   Brandy Saunders has been off Wegovy due to hospitalization. She agreed to increase Wegovy to 1.7 mg q week, with no refills, her current dose is on backorder.   Behavioral modification strategies: increasing lean protein intake and meal planning and cooking strategies.  Brandy Saunders has agreed to follow-up with our clinic in 3 to 4 weeks. She was informed of the importance of frequent follow-up visits to maximize her success with intensive lifestyle modifications for her multiple health conditions.   Objective:   Blood pressure 100/66, pulse 81, temperature 98.1 F (36.7 C), height '5\' 4"'$  (1.626 m), weight (!) 435 lb 6.4 oz (197.5 kg), last menstrual period 02/02/2022, SpO2 98 %. Body mass index is 74.74 kg/m.  General: Cooperative, alert, well developed, in no acute distress. HEENT: Conjunctivae and lids unremarkable. Cardiovascular: Regular rhythm.  Lungs: Normal work of breathing. Neurologic: No focal deficits.   Lab Results  Component Value Date   CREATININE 1.09 (H) 02/06/2022   BUN 13 02/06/2022   NA 134 (L) 02/06/2022   K 3.4 (L) 02/06/2022  CL 98 02/06/2022   CO2 24 02/06/2022   Lab Results  Component Value Date   ALT 49 (H) 09/15/2021   AST 31 09/15/2021   ALKPHOS 54 09/15/2021   BILITOT 0.5 09/15/2021   Lab Results  Component Value Date   HGBA1C 5.7 (H) 09/15/2021   HGBA1C 5.6 12/03/2019   HGBA1C 5.8 (H) 06/17/2019   HGBA1C 5.8 (H) 05/02/2018   HGBA1C  5.7 (H) 01/17/2018   Lab Results  Component Value Date   INSULIN 43.1 (H) 09/15/2021   INSULIN 30.8 (H) 12/03/2019   INSULIN 35.9 (H) 06/17/2019   INSULIN 30.3 (H) 05/02/2018   INSULIN 39.5 (H) 01/17/2018   Lab Results  Component Value Date   TSH 1.880 09/15/2021   Lab Results  Component Value Date   CHOL 173 09/15/2021   HDL 47 09/15/2021   LDLCALC 102 (H) 09/15/2021   TRIG 133 09/15/2021   CHOLHDL 3 02/26/2020   Lab Results  Component Value Date   VD25OH 65.5 09/15/2021   VD25OH 34.27 02/26/2020   VD25OH 39.8 12/03/2019   Lab Results  Component Value Date   WBC 12.5 (H) 02/06/2022   HGB 11.7 (L) 02/06/2022   HCT 36.5 02/06/2022   MCV 85.7 02/06/2022   PLT 280 02/06/2022   No results found for: "IRON", "TIBC", "FERRITIN"  Attestation Statements:   Reviewed by clinician on day of visit: allergies, medications, problem list, medical history, surgical history, family history, social history, and previous encounter notes.   I, Trixie Dredge, am acting as transcriptionist for Dennard Nip, MD.  I have reviewed the above documentation for accuracy and completeness, and I agree with the above. -  Dennard Nip, MD

## 2022-02-17 DIAGNOSIS — M25476 Effusion, unspecified foot: Secondary | ICD-10-CM | POA: Diagnosis not present

## 2022-02-17 DIAGNOSIS — R2240 Localized swelling, mass and lump, unspecified lower limb: Secondary | ICD-10-CM | POA: Diagnosis not present

## 2022-02-20 DIAGNOSIS — R2240 Localized swelling, mass and lump, unspecified lower limb: Secondary | ICD-10-CM | POA: Diagnosis not present

## 2022-02-20 DIAGNOSIS — B370 Candidal stomatitis: Secondary | ICD-10-CM | POA: Diagnosis not present

## 2022-02-21 DIAGNOSIS — R609 Edema, unspecified: Secondary | ICD-10-CM | POA: Diagnosis not present

## 2022-02-21 DIAGNOSIS — L03116 Cellulitis of left lower limb: Secondary | ICD-10-CM | POA: Diagnosis not present

## 2022-02-21 DIAGNOSIS — I890 Lymphedema, not elsewhere classified: Secondary | ICD-10-CM | POA: Diagnosis not present

## 2022-02-23 DIAGNOSIS — R2240 Localized swelling, mass and lump, unspecified lower limb: Secondary | ICD-10-CM | POA: Diagnosis not present

## 2022-02-24 DIAGNOSIS — S8992XD Unspecified injury of left lower leg, subsequent encounter: Secondary | ICD-10-CM | POA: Diagnosis not present

## 2022-02-24 DIAGNOSIS — I890 Lymphedema, not elsewhere classified: Secondary | ICD-10-CM | POA: Diagnosis not present

## 2022-02-24 DIAGNOSIS — S8002XD Contusion of left knee, subsequent encounter: Secondary | ICD-10-CM | POA: Diagnosis not present

## 2022-03-02 DIAGNOSIS — R2240 Localized swelling, mass and lump, unspecified lower limb: Secondary | ICD-10-CM | POA: Diagnosis not present

## 2022-03-02 MED ORDER — SEMAGLUTIDE-WEIGHT MANAGEMENT 1.7 MG/0.75ML ~~LOC~~ SOAJ: 1.7000 mg | SUBCUTANEOUS | 0 refills | Status: DC

## 2022-03-03 DIAGNOSIS — I890 Lymphedema, not elsewhere classified: Secondary | ICD-10-CM | POA: Diagnosis not present

## 2022-03-03 DIAGNOSIS — S8992XD Unspecified injury of left lower leg, subsequent encounter: Secondary | ICD-10-CM | POA: Diagnosis not present

## 2022-03-03 DIAGNOSIS — R7303 Prediabetes: Secondary | ICD-10-CM | POA: Diagnosis not present

## 2022-03-07 ENCOUNTER — Other Ambulatory Visit (INDEPENDENT_AMBULATORY_CARE_PROVIDER_SITE_OTHER): Payer: Self-pay | Admitting: Family Medicine

## 2022-03-07 DIAGNOSIS — E559 Vitamin D deficiency, unspecified: Secondary | ICD-10-CM

## 2022-03-13 ENCOUNTER — Other Ambulatory Visit (INDEPENDENT_AMBULATORY_CARE_PROVIDER_SITE_OTHER): Payer: Self-pay | Admitting: Family Medicine

## 2022-03-13 ENCOUNTER — Ambulatory Visit (INDEPENDENT_AMBULATORY_CARE_PROVIDER_SITE_OTHER): Payer: BC Managed Care – PPO | Admitting: Family Medicine

## 2022-03-13 ENCOUNTER — Encounter (INDEPENDENT_AMBULATORY_CARE_PROVIDER_SITE_OTHER): Payer: Self-pay | Admitting: Family Medicine

## 2022-03-13 VITALS — BP 92/66 | HR 92 | Temp 98.1°F | Ht 64.0 in | Wt >= 6400 oz

## 2022-03-13 DIAGNOSIS — E559 Vitamin D deficiency, unspecified: Secondary | ICD-10-CM | POA: Diagnosis not present

## 2022-03-13 DIAGNOSIS — E669 Obesity, unspecified: Secondary | ICD-10-CM

## 2022-03-13 DIAGNOSIS — I10 Essential (primary) hypertension: Secondary | ICD-10-CM

## 2022-03-13 DIAGNOSIS — F3289 Other specified depressive episodes: Secondary | ICD-10-CM

## 2022-03-13 DIAGNOSIS — Z6841 Body Mass Index (BMI) 40.0 and over, adult: Secondary | ICD-10-CM

## 2022-03-13 DIAGNOSIS — R7303 Prediabetes: Secondary | ICD-10-CM

## 2022-03-13 MED ORDER — VITAMIN D (ERGOCALCIFEROL) 1.25 MG (50000 UNIT) PO CAPS: 50000.0000 [IU] | ORAL_CAPSULE | ORAL | 0 refills | Status: DC

## 2022-03-13 MED ORDER — BUPROPION HCL ER (XL) 300 MG PO TB24: 300.0000 mg | ORAL_TABLET | Freq: Every morning | ORAL | 0 refills | Status: DC

## 2022-03-13 MED ORDER — SEMAGLUTIDE-WEIGHT MANAGEMENT 1.7 MG/0.75ML ~~LOC~~ SOAJ: 1.7000 mg | SUBCUTANEOUS | 0 refills | Status: DC

## 2022-03-14 DIAGNOSIS — M79605 Pain in left leg: Secondary | ICD-10-CM | POA: Diagnosis not present

## 2022-03-14 DIAGNOSIS — I890 Lymphedema, not elsewhere classified: Secondary | ICD-10-CM | POA: Diagnosis not present

## 2022-03-14 DIAGNOSIS — R262 Difficulty in walking, not elsewhere classified: Secondary | ICD-10-CM | POA: Diagnosis not present

## 2022-03-14 DIAGNOSIS — R29898 Other symptoms and signs involving the musculoskeletal system: Secondary | ICD-10-CM | POA: Diagnosis not present

## 2022-03-14 LAB — CMP14+EGFR
ALT: 33 IU/L — ABNORMAL HIGH (ref 0–32)
AST: 23 IU/L (ref 0–40)
Albumin/Globulin Ratio: 1.8 (ref 1.2–2.2)
Albumin: 4.5 g/dL (ref 3.9–4.9)
Alkaline Phosphatase: 59 IU/L (ref 44–121)
BUN/Creatinine Ratio: 11 (ref 9–23)
BUN: 8 mg/dL (ref 6–20)
Bilirubin Total: 0.5 mg/dL (ref 0.0–1.2)
CO2: 27 mmol/L (ref 20–29)
Calcium: 9.5 mg/dL (ref 8.7–10.2)
Chloride: 100 mmol/L (ref 96–106)
Creatinine, Ser: 0.74 mg/dL (ref 0.57–1.00)
Globulin, Total: 2.5 g/dL (ref 1.5–4.5)
Glucose: 108 mg/dL — ABNORMAL HIGH (ref 70–99)
Potassium: 3.9 mmol/L (ref 3.5–5.2)
Sodium: 139 mmol/L (ref 134–144)
Total Protein: 7 g/dL (ref 6.0–8.5)
eGFR: 111 mL/min/{1.73_m2} (ref >59)

## 2022-03-14 LAB — HEMOGLOBIN A1C
Est. average glucose Bld gHb Est-mCnc: 114 mg/dL
Hgb A1c MFr Bld: 5.6 % (ref 4.8–5.6)

## 2022-03-14 LAB — VITAMIN D 25 HYDROXY (VIT D DEFICIENCY, FRACTURES): Vit D, 25-Hydroxy: 69.9 ng/mL (ref 30.0–100.0)

## 2022-03-15 NOTE — Progress Notes (Signed)
Chief Complaint:   OBESITY Brandy Saunders Saunders is here to discuss her progress with her obesity treatment plan along with follow-up of her obesity related diagnoses. Brandy Saunders Saunders is on practicing portion control and making smarter food choices, such as increasing vegetables and decreasing simple carbohydrates and states she is following her eating plan approximately 50% of the time. Brandy Saunders Saunders states she is doing 0 minutes 0 times per week.  Today's visit was #: 32 Starting weight: 447 lbs Starting date: 10/11/2017 Today's weight: 433 lbs Today's date: 03/13/2022 Total lbs lost to date: 14 Total lbs lost since last in-office visit: 2  Interim History: Brandy Saunders continues to work on weight loss and she is down another 2 pounds.  She wants to avoid meat.  She restarted Wegovy and her hunger is better controlled.  Subjective:   1. Vitamin D deficiency Brandy Saunders Saunders is on vitamin D, and she is overdue for labs.  2. Pre-diabetes Brandy Saunders Saunders is working on her diet and exercise, and she is due for labs.  3. Other depression with emotional eating Brandy Saunders Saunders is doing well with her medications, and decreasing emotional eating behaviors.  No side effects were noted.  Assessment/Plan:   1. Vitamin D deficiency We will check labs today, and we will refill prescription vitamin D 50,000 units once weekly for 1 month.  - VITAMIN D 25 Hydroxy (Vit-D Deficiency, Fractures) - Vitamin D, Ergocalciferol, (DRISDOL) 1.25 MG (50000 UNIT) CAPS capsule; Take 1 capsule (50,000 Units total) by mouth every Sunday.  Dispense: 4 capsule; Refill: 0  2. Pre-diabetes We will check labs today, and Brandy Saunders Saunders will continue with her diet and exercise.  - CMP14+EGFR - Hemoglobin A1c  3. Other depression with emotional eating Brandy Saunders Saunders will continue Wellbutrin XL 300 mg every morning, and we will refill for 1 month.  - buPROPion (WELLBUTRIN XL) 300 MG 24 hr tablet; Take 1 tablet (300 mg total) by mouth in the morning.  Dispense: 30  tablet; Refill: 0  4. Obesity, Current BMI 74.3 Brandy Saunders Saunders is currently in the action stage of change. As such, her goal is to continue with weight loss efforts. She has agreed to the [place].   We discussed various medication options to help Brandy Saunders Saunders with her weight loss efforts and we both agreed to continue Wegovy 1.7 mg once weekly, and we will refill for 1 month.  - Semaglutide-Weight Management 1.7 MG/0.75ML SOAJ; Inject 1.7 mg into the skin once a week.  Dispense: 3 mL; Refill: 0  Behavioral modification strategies: increasing lean protein intake.  Brandy Saunders Saunders has agreed to follow-up with our clinic in 4 weeks. She was informed of the importance of frequent follow-up visits to maximize her success with intensive lifestyle modifications for her multiple health conditions.   Brandy Saunders Saunders was informed we would discuss her lab results at her next visit unless there is a critical issue that needs to be addressed sooner. Brandy Saunders Saunders agreed to keep her next visit at the agreed upon time to discuss these results.  Objective:   Blood pressure 92/66, pulse 92, temperature 98.1 F (36.7 C), height _0  (1.626 m), weight (!) 433 lb (196.4 kg), last menstrual period 02/02/2022, SpO2 98 %. Body mass index is 74.32 kg/m.  General: Cooperative, alert, well developed, in no acute distress. HEENT: Conjunctivae and lids unremarkable. Cardiovascular: Regular rhythm.  Lungs: Normal work of breathing. Neurologic: No focal deficits.   Lab Results  Component Value Date   CREATININE 0.74 03/13/2022   BUN 8 03/13/2022  NA 139 03/13/2022   K 3.9 03/13/2022   CL 100 03/13/2022   CO2 27 03/13/2022   Lab Results  Component Value Date   ALT 33 (H) 03/13/2022   AST 23 03/13/2022   ALKPHOS 59 03/13/2022   BILITOT 0.5 03/13/2022   Lab Results  Component Value Date   HGBA1C 5.6 03/13/2022   HGBA1C 5.7 (H) 09/15/2021   HGBA1C 5.6 12/03/2019   HGBA1C 5.8 (H) 06/17/2019    HGBA1C 5.8 (H) 05/02/2018   Lab Results  Component Value Date   INSULIN 43.1 (H) 09/15/2021   INSULIN 30.8 (H) 12/03/2019   INSULIN 35.9 (H) 06/17/2019   INSULIN 30.3 (H) 05/02/2018   INSULIN 39.5 (H) 01/17/2018   Lab Results  Component Value Date   TSH 1.880 09/15/2021   Lab Results  Component Value Date   CHOL 173 09/15/2021   HDL 47 09/15/2021   LDLCALC 102 (H) 09/15/2021   TRIG 133 09/15/2021   CHOLHDL 3 02/26/2020   Lab Results  Component Value Date   VD25OH 69.9 03/13/2022   VD25OH 65.5 09/15/2021   VD25OH 34.27 02/26/2020   Lab Results  Component Value Date   WBC 12.5 (H) 02/06/2022   HGB 11.7 (L) 02/06/2022   HCT 36.5 02/06/2022   MCV 85.7 02/06/2022   PLT 280 02/06/2022   No results found for: "IRON", "TIBC", "FERRITIN"  Attestation Statements:   Reviewed by clinician on day of visit: allergies, medications, problem list, medical history, surgical history, family history, social history, and previous encounter notes.   I, Trixie Dredge, am acting as transcriptionist for Dennard Nip, MD.  I have reviewed the above documentation for accuracy and completeness, and I agree with the above. -  Dennard Nip, MD

## 2022-03-16 DIAGNOSIS — I890 Lymphedema, not elsewhere classified: Secondary | ICD-10-CM | POA: Diagnosis not present

## 2022-03-16 DIAGNOSIS — R609 Edema, unspecified: Secondary | ICD-10-CM | POA: Diagnosis not present

## 2022-03-16 DIAGNOSIS — L03116 Cellulitis of left lower limb: Secondary | ICD-10-CM | POA: Diagnosis not present

## 2022-03-16 DIAGNOSIS — R2689 Other abnormalities of gait and mobility: Secondary | ICD-10-CM | POA: Diagnosis not present

## 2022-03-20 DIAGNOSIS — F411 Generalized anxiety disorder: Secondary | ICD-10-CM | POA: Diagnosis not present

## 2022-03-20 DIAGNOSIS — F331 Major depressive disorder, recurrent, moderate: Secondary | ICD-10-CM | POA: Diagnosis not present

## 2022-03-27 ENCOUNTER — Other Ambulatory Visit (INDEPENDENT_AMBULATORY_CARE_PROVIDER_SITE_OTHER): Payer: Self-pay | Admitting: Family Medicine

## 2022-03-27 DIAGNOSIS — E559 Vitamin D deficiency, unspecified: Secondary | ICD-10-CM

## 2022-03-29 DIAGNOSIS — M79604 Pain in right leg: Secondary | ICD-10-CM | POA: Diagnosis not present

## 2022-03-29 DIAGNOSIS — I890 Lymphedema, not elsewhere classified: Secondary | ICD-10-CM | POA: Diagnosis not present

## 2022-03-29 DIAGNOSIS — R29898 Other symptoms and signs involving the musculoskeletal system: Secondary | ICD-10-CM | POA: Diagnosis not present

## 2022-03-29 DIAGNOSIS — M79605 Pain in left leg: Secondary | ICD-10-CM | POA: Diagnosis not present

## 2022-03-29 DIAGNOSIS — F411 Generalized anxiety disorder: Secondary | ICD-10-CM | POA: Diagnosis not present

## 2022-03-29 DIAGNOSIS — F331 Major depressive disorder, recurrent, moderate: Secondary | ICD-10-CM | POA: Diagnosis not present

## 2022-04-03 DIAGNOSIS — F331 Major depressive disorder, recurrent, moderate: Secondary | ICD-10-CM | POA: Diagnosis not present

## 2022-04-03 DIAGNOSIS — F411 Generalized anxiety disorder: Secondary | ICD-10-CM | POA: Diagnosis not present

## 2022-04-05 ENCOUNTER — Encounter (INDEPENDENT_AMBULATORY_CARE_PROVIDER_SITE_OTHER): Payer: Self-pay

## 2022-04-10 DIAGNOSIS — F411 Generalized anxiety disorder: Secondary | ICD-10-CM | POA: Diagnosis not present

## 2022-04-10 DIAGNOSIS — R29898 Other symptoms and signs involving the musculoskeletal system: Secondary | ICD-10-CM | POA: Diagnosis not present

## 2022-04-10 DIAGNOSIS — F331 Major depressive disorder, recurrent, moderate: Secondary | ICD-10-CM | POA: Diagnosis not present

## 2022-04-10 DIAGNOSIS — M79604 Pain in right leg: Secondary | ICD-10-CM | POA: Diagnosis not present

## 2022-04-10 DIAGNOSIS — M79605 Pain in left leg: Secondary | ICD-10-CM | POA: Diagnosis not present

## 2022-04-10 DIAGNOSIS — I890 Lymphedema, not elsewhere classified: Secondary | ICD-10-CM | POA: Diagnosis not present

## 2022-04-11 ENCOUNTER — Encounter (INDEPENDENT_AMBULATORY_CARE_PROVIDER_SITE_OTHER): Payer: Self-pay | Admitting: Family Medicine

## 2022-04-11 ENCOUNTER — Ambulatory Visit (INDEPENDENT_AMBULATORY_CARE_PROVIDER_SITE_OTHER): Payer: BC Managed Care – PPO | Admitting: Family Medicine

## 2022-04-11 VITALS — BP 102/79 | HR 79 | Temp 98.1°F | Ht 64.0 in | Wt >= 6400 oz

## 2022-04-11 DIAGNOSIS — R601 Generalized edema: Secondary | ICD-10-CM | POA: Diagnosis not present

## 2022-04-11 DIAGNOSIS — I10 Essential (primary) hypertension: Secondary | ICD-10-CM | POA: Diagnosis not present

## 2022-04-11 DIAGNOSIS — E669 Obesity, unspecified: Secondary | ICD-10-CM

## 2022-04-11 DIAGNOSIS — Z6841 Body Mass Index (BMI) 40.0 and over, adult: Secondary | ICD-10-CM

## 2022-04-11 DIAGNOSIS — F3289 Other specified depressive episodes: Secondary | ICD-10-CM | POA: Diagnosis not present

## 2022-04-11 DIAGNOSIS — E559 Vitamin D deficiency, unspecified: Secondary | ICD-10-CM

## 2022-04-11 MED ORDER — VITAMIN D (ERGOCALCIFEROL) 1.25 MG (50000 UNIT) PO CAPS: 50000.0000 [IU] | ORAL_CAPSULE | ORAL | 0 refills | Status: AC

## 2022-04-11 MED ORDER — METOPROLOL SUCCINATE ER 100 MG PO TB24: 100.0000 mg | ORAL_TABLET | Freq: Every day | ORAL | 0 refills | Status: AC

## 2022-04-11 MED ORDER — CHLORTHALIDONE 25 MG PO TABS: 12.5000 mg | ORAL_TABLET | Freq: Every day | ORAL | 0 refills | Status: AC

## 2022-04-11 MED ORDER — BUPROPION HCL ER (XL) 300 MG PO TB24: 300.0000 mg | ORAL_TABLET | Freq: Every morning | ORAL | 0 refills | Status: DC

## 2022-04-11 MED ORDER — SEMAGLUTIDE-WEIGHT MANAGEMENT 1.7 MG/0.75ML ~~LOC~~ SOAJ: 1.7000 mg | SUBCUTANEOUS | 0 refills | Status: AC

## 2022-04-12 ENCOUNTER — Encounter (INDEPENDENT_AMBULATORY_CARE_PROVIDER_SITE_OTHER): Payer: Self-pay | Admitting: Family Medicine

## 2022-04-12 DIAGNOSIS — F3289 Other specified depressive episodes: Secondary | ICD-10-CM

## 2022-04-12 LAB — BRAIN NATRIURETIC PEPTIDE: BNP: 31.7 pg/mL (ref 0.0–100.0)

## 2022-04-17 DIAGNOSIS — M79604 Pain in right leg: Secondary | ICD-10-CM | POA: Diagnosis not present

## 2022-04-17 DIAGNOSIS — F331 Major depressive disorder, recurrent, moderate: Secondary | ICD-10-CM | POA: Diagnosis not present

## 2022-04-17 DIAGNOSIS — F411 Generalized anxiety disorder: Secondary | ICD-10-CM | POA: Diagnosis not present

## 2022-04-17 DIAGNOSIS — I890 Lymphedema, not elsewhere classified: Secondary | ICD-10-CM | POA: Diagnosis not present

## 2022-04-17 DIAGNOSIS — M79605 Pain in left leg: Secondary | ICD-10-CM | POA: Diagnosis not present

## 2022-04-17 DIAGNOSIS — R29898 Other symptoms and signs involving the musculoskeletal system: Secondary | ICD-10-CM | POA: Diagnosis not present

## 2022-04-19 DIAGNOSIS — Z6841 Body Mass Index (BMI) 40.0 and over, adult: Secondary | ICD-10-CM | POA: Diagnosis not present

## 2022-04-19 DIAGNOSIS — N926 Irregular menstruation, unspecified: Secondary | ICD-10-CM | POA: Diagnosis not present

## 2022-04-19 DIAGNOSIS — Z124 Encounter for screening for malignant neoplasm of cervix: Secondary | ICD-10-CM | POA: Diagnosis not present

## 2022-04-19 DIAGNOSIS — Z113 Encounter for screening for infections with a predominantly sexual mode of transmission: Secondary | ICD-10-CM | POA: Diagnosis not present

## 2022-04-19 DIAGNOSIS — Z01419 Encounter for gynecological examination (general) (routine) without abnormal findings: Secondary | ICD-10-CM | POA: Diagnosis not present

## 2022-04-19 NOTE — Progress Notes (Signed)
Chief Complaint:   OBESITY Brandy Saunders is here to discuss her progress with her obesity treatment plan along with follow-up of her obesity related diagnoses. Brandy Saunders is on the [place] and states she is following her eating plan approximately 60% of the time. Brandy Saunders states she is doing 0 minutes 0 times per week.  Today's visit was #: 66 Starting weight: 447 lbs Starting date: 10/11/2017 Today's weight: 448 lbs Today's date: 04/11/2022 Total lbs lost to date: 0 Total lbs lost since last in-office visit: 0  Interim History: Brandy Saunders did some celebration eating over her birthday. She had an increase in nausea and she stopped her 75. She is up 15 lbs and some of which is likely fluid.   Subjective:   1. Generalized edema Brandy Saunders notes increased edema with some mild increase in shortness of breath. She is up 15 lbs in 1 month. She has no recent illness, and she is doing physical therapy lymphedema treatment.   2. Vitamin D deficiency Brandy Saunders's Vitamin D level is at goal, with no side effects noted.    3. Essential hypertension Brandy Saunders blood pressure is stable on her medications. She is getting back to her weight loss efforts.   4. Other depression with emotional eating Brandy Saunders is stable on her medications, and may be changed due to seeing a new Psychiatrist.   Assessment/Plan:   1. Generalized edema We will check labs today, and Brandy Saunders will get back to her diet and weight loss.   - Brain natriuretic peptide  2. Vitamin D deficiency Brandy Saunders will continue prescription Vitamin D 50,000 IU every week, and we will refill for 90 days. She will follow-up for routine testing of Vitamin D, at least 2-3 times per year to avoid over-replacement.  - Vitamin D, Ergocalciferol, (DRISDOL) 1.25 MG (50000 UNIT) CAPS capsule; Take 1 capsule (50,000 Units total) by mouth every Sunday.  Dispense: 12 capsule; Refill: 0  3. Essential  hypertension Brandy Saunders will continue her medications, and we will refill chlorthalidone for 1 month and Toprol for 90 days.   - chlorthalidone (HYGROTON) 25 MG tablet; Take 0.5 tablets (12.5 mg total) by mouth daily.  Dispense: 45 tablet; Refill: 0 - metoprolol succinate (TOPROL-XL) 100 MG 24 hr tablet; Take 1 tablet (100 mg total) by mouth daily. Take with or immediately following a meal.  Dispense: 90 tablet; Refill: 0  4. Other depression with emotional eating We will refill Wellbutrin XL for 90 days. Behavior modification techniques were discussed today to help Brandy Saunders deal with her emotional/non-hunger eating behaviors.  Orders and follow up as documented in patient record.   - buPROPion (WELLBUTRIN XL) 300 MG 24 hr tablet; Take 1 tablet (300 mg total) by mouth in the morning.  Dispense: 90 tablet; Refill: 0  5. Obesity, Current BMI 76.9 Brandy Saunders is currently in the action stage of change. As such, her goal is to continue with weight loss efforts. She has agreed to keeping a food journal and adhering to recommended goals of 1500-1800 calories and 100+ grams of protein daily.   We discussed various medication options to help Brandy Saunders with her weight loss efforts and we both agreed to restart Wegovy at 1.7 mg (patient is to take every 10 days for now).  - Semaglutide-Weight Management 1.7 MG/0.75ML SOAJ; Inject 1.7 mg into the skin once a week.  Dispense: 3 mL; Refill: 0  Behavioral modification strategies: increasing lean protein intake.  Brandy Saunders has agreed to follow-up with our clinic  in 3 weeks. She was informed of the importance of frequent follow-up visits to maximize her success with intensive lifestyle modifications for her multiple health conditions.   Objective:   Blood pressure 102/79, pulse 79, temperature 98.1 F (36.7 C), height '5\' 4"'$  (1.626 m), weight (!) 448 lb (203.2 kg), SpO2 99 %. Body mass index is 76.9 kg/m.  General: Cooperative, alert, well developed, in no  acute distress. HEENT: Conjunctivae and lids unremarkable. Cardiovascular: Regular rhythm.  Lungs: Normal work of breathing. Neurologic: No focal deficits.   Lab Results  Component Value Date   CREATININE 0.74 03/13/2022   BUN 8 03/13/2022   NA 139 03/13/2022   K 3.9 03/13/2022   CL 100 03/13/2022   CO2 27 03/13/2022   Lab Results  Component Value Date   ALT 33 (H) 03/13/2022   AST 23 03/13/2022   ALKPHOS 59 03/13/2022   BILITOT 0.5 03/13/2022   Lab Results  Component Value Date   HGBA1C 5.6 03/13/2022   HGBA1C 5.7 (H) 09/15/2021   HGBA1C 5.6 12/03/2019   HGBA1C 5.8 (H) 06/17/2019   HGBA1C 5.8 (H) 05/02/2018   Lab Results  Component Value Date   INSULIN 43.1 (H) 09/15/2021   INSULIN 30.8 (H) 12/03/2019   INSULIN 35.9 (H) 06/17/2019   INSULIN 30.3 (H) 05/02/2018   INSULIN 39.5 (H) 01/17/2018   Lab Results  Component Value Date   TSH 1.880 09/15/2021   Lab Results  Component Value Date   CHOL 173 09/15/2021   HDL 47 09/15/2021   LDLCALC 102 (H) 09/15/2021   TRIG 133 09/15/2021   CHOLHDL 3 02/26/2020   Lab Results  Component Value Date   VD25OH 69.9 03/13/2022   VD25OH 65.5 09/15/2021   VD25OH 34.27 02/26/2020   Lab Results  Component Value Date   WBC 12.5 (H) 02/06/2022   HGB 11.7 (L) 02/06/2022   HCT 36.5 02/06/2022   MCV 85.7 02/06/2022   PLT 280 02/06/2022   No results found for: "IRON", "TIBC", "FERRITIN"  Attestation Statements:   Reviewed by clinician on day of visit: allergies, medications, problem list, medical history, surgical history, family history, social history, and previous encounter notes.  Time spent on visit including pre-visit chart review and post-visit care and charting was 40 minutes.   I, Trixie Dredge, am acting as transcriptionist for Dennard Nip, MD.  I have reviewed the above documentation for accuracy and completeness, and I agree with the above. -  Dennard Nip, MD

## 2022-04-24 DIAGNOSIS — I890 Lymphedema, not elsewhere classified: Secondary | ICD-10-CM | POA: Diagnosis not present

## 2022-04-24 DIAGNOSIS — M79604 Pain in right leg: Secondary | ICD-10-CM | POA: Diagnosis not present

## 2022-04-24 DIAGNOSIS — R29898 Other symptoms and signs involving the musculoskeletal system: Secondary | ICD-10-CM | POA: Diagnosis not present

## 2022-04-24 DIAGNOSIS — M79605 Pain in left leg: Secondary | ICD-10-CM | POA: Diagnosis not present

## 2022-05-03 DIAGNOSIS — I890 Lymphedema, not elsewhere classified: Secondary | ICD-10-CM | POA: Diagnosis not present

## 2022-05-08 DIAGNOSIS — F411 Generalized anxiety disorder: Secondary | ICD-10-CM | POA: Diagnosis not present

## 2022-05-08 DIAGNOSIS — F331 Major depressive disorder, recurrent, moderate: Secondary | ICD-10-CM | POA: Diagnosis not present

## 2022-05-08 MED ORDER — BUPROPION HCL ER (XL) 300 MG PO TB24: 300.0000 mg | ORAL_TABLET | Freq: Every morning | ORAL | 0 refills | Status: AC

## 2022-05-08 NOTE — Telephone Encounter (Signed)
Ok to refill x 1  

## 2022-05-10 ENCOUNTER — Other Ambulatory Visit (INDEPENDENT_AMBULATORY_CARE_PROVIDER_SITE_OTHER): Payer: Self-pay | Admitting: Family Medicine

## 2022-05-10 DIAGNOSIS — I10 Essential (primary) hypertension: Secondary | ICD-10-CM

## 2022-05-13 ENCOUNTER — Encounter (HOSPITAL_BASED_OUTPATIENT_CLINIC_OR_DEPARTMENT_OTHER): Payer: Self-pay

## 2022-05-13 ENCOUNTER — Emergency Department (HOSPITAL_BASED_OUTPATIENT_CLINIC_OR_DEPARTMENT_OTHER): Payer: BC Managed Care – PPO

## 2022-05-13 ENCOUNTER — Emergency Department (HOSPITAL_BASED_OUTPATIENT_CLINIC_OR_DEPARTMENT_OTHER)
Admission: EM | Admit: 2022-05-13 | Discharge: 2022-05-13 | Disposition: A | Discharge status: Home / Self Care | Payer: BC Managed Care – PPO | Attending: Emergency Medicine | Admitting: Emergency Medicine

## 2022-05-13 DIAGNOSIS — M25561 Pain in right knee: Secondary | ICD-10-CM | POA: Insufficient documentation

## 2022-05-13 DIAGNOSIS — M79661 Pain in right lower leg: Secondary | ICD-10-CM | POA: Diagnosis not present

## 2022-05-13 NOTE — ED Provider Notes (Signed)
[facility] EMERGENCY DEPT Provider Note   CSN: [id number] Arrival date & time: 05/13/22  1336     History  Chief Complaint  Patient presents with   Leg Pain    Brandy Saunders is a 31 y.o. female with history of obesity, lymphedema, presented ED with pain in her right leg in the for approximately 1 month.  She said this began when she was taking a long car trip to [place], it was walking around town to begin having pain behind her right knee.  The pain has been persistent for a month.  It is generally worse with walking and bending her knee.  Sometimes it shoots down into her toes.  She does also feel a new "popping sound" when she walks on her right leg.  She has no prior history of DVT or PE but is concerned about a potential blood clot.  She does have follow-up arranged with sports medicine orthopedics next week.  HPI     Home Medications Prior to Admission medications   Medication Sig Start Date End Date Taking? Authorizing Provider  acetaminophen (TYLENOL) 500 MG tablet Take 1,000 mg by mouth every 6 (six) hours as needed for moderate pain or headache.    [provider]  albuterol (VENTOLIN HFA) 108 (90 Base) MCG/ACT inhaler Inhale 2 puffs into the lungs every 4 (four) hours as needed for wheezing or shortness of breath. 01/12/22   Valentina Shaggy, MD  Ascorbic Acid (VITAMIN C) 1000 MG tablet Take 1,000 mg by mouth daily.    [provider]  Biotin (BIOTIN 5000) 5 MG CAPS Take 2 capsules (10 mg total) by mouth daily. 02/06/22   Eugenie Filler, MD  buPROPion (WELLBUTRIN XL) 300 MG 24 hr tablet Take 1 tablet (300 mg total) by mouth in the morning. 05/08/22   Dennard Nip D, MD  chlorthalidone (HYGROTON) 25 MG tablet Take 0.5 tablets (12.5 mg total) by mouth daily. 04/11/22   Dennard Nip D, MD  Cholecalciferol (VITAMIN D) 50 MCG (2000 UT) CAPS Take 1 capsule (2,000 Units total) by mouth daily. 09/15/21   Dennard Nip D, MD   Cyanocobalamin (VITAMIN B 12 PO) Take 2,500 mcg by mouth daily.    [provider]  furosemide (LASIX) 40 MG tablet Take 1 tablet (40 mg total) by mouth daily for 3 days. 02/06/22 02/09/22  Eugenie Filler, MD  medroxyPROGESTERone (PROVERA) 10 MG tablet Take 10 mg by mouth See admin instructions. Every 3 months to bring on menstrual cycle 01/02/22   [provider]  metoprolol succinate (TOPROL-XL) 100 MG 24 hr tablet Take 1 tablet (100 mg total) by mouth daily. Take with or immediately following a meal. 04/11/22   Dennard Nip D, MD  Multiple Vitamins-Calcium (ONE-A-DAY WOMENS PO) Take 1 tablet by mouth daily.     [provider]  ondansetron (ZOFRAN) 4 MG tablet Take 1 tablet (4 mg total) by mouth every 6 (six) hours as needed for nausea. 02/06/22   Eugenie Filler, MD  oxyCODONE-acetaminophen (PERCOCET/ROXICET) 5-325 MG tablet Take 1 tablet by mouth every 4 (four) hours as needed for moderate pain. 02/06/22   Eugenie Filler, MD  polyethylene glycol (MIRALAX / GLYCOLAX) 17 g packet Take 17 g by mouth daily as needed (constipation).    [provider]  potassium chloride SA (KLOR-CON M) 20 MEQ tablet Take 1 tablet (20 mEq total) by mouth daily for 3 days. 02/06/22 02/09/22  Eugenie Filler, MD  Semaglutide-Weight  Management 1.7 MG/0.75ML SOAJ Inject 1.7 mg into the skin once a week. 04/11/22   Dennard Nip D, MD  Vitamin D, Ergocalciferol, (DRISDOL) 1.25 MG (50000 UNIT) CAPS capsule Take 1 capsule (50,000 Units total) by mouth every Sunday. 04/16/22   Dennard Nip D, MD      Allergies    Ibuprofen and Meloxicam    Review of Systems   Review of Systems  Physical Exam Updated Vital Signs BP (!) 148/95 (BP Location: Right Arm)   Pulse 79   Temp 98.2 F (36.8 C) (Oral)   Resp 16   Ht '5\' 5"'$  (1.651 m)   Wt (!) 199.6 kg   LMP 12/09/2021   SpO2 98%   BMI 73.22 kg/m  Physical Exam Constitutional:      General: She is not in acute distress.     Appearance: She is obese.  HENT:     Head: Normocephalic and atraumatic.  Eyes:     Conjunctiva/sclera: Conjunctivae normal.     Pupils: Pupils are equal, round, and reactive to light.  Cardiovascular:     Rate and Rhythm: Normal rate and regular rhythm.  Pulmonary:     Effort: Pulmonary effort is normal. No respiratory distress.  Abdominal:     General: There is no distension.     Tenderness: There is no abdominal tenderness.  Musculoskeletal:     Comments: Full range of motion of the lower extremities, no unilateral leg swelling, significant lymphedema of the lower extremities which appears symmetrical, no crepitus or tenderness to palpation of the posterior leg  Skin:    General: Skin is warm and dry.  Neurological:     General: No focal deficit present.     Mental Status: She is alert. Mental status is at baseline.  Psychiatric:        Mood and Affect: Mood normal.        Behavior: Behavior normal.     ED Results / Procedures / Treatments   Labs (all labs ordered are listed, but only abnormal results are displayed) Labs Reviewed - No data to display  EKG None  Radiology US Venous Img Lower Unilateral Right  Result Date: 05/13/2022 CLINICAL DATA:  posterior popliteal pain after long car ride x 1 month EXAM: Right LOWER EXTREMITY VENOUS DOPPLER ULTRASOUND TECHNIQUE: Gray-scale sonography with compression, as well as color and duplex ultrasound, were performed to evaluate the deep venous system(s) from the level of the common femoral vein through the popliteal and proximal calf veins. COMPARISON:  None Available. FINDINGS: VENOUS Normal compressibility of the common femoral, superficial femoral, and popliteal veins, as well as the visualized calf veins. Visualized portions of profunda femoral vein and great saphenous vein unremarkable. No filling defects to suggest DVT on grayscale or color Doppler imaging. Doppler waveforms show normal direction of venous flow, normal  respiratory plasticity and response to augmentation. Limited views of the contralateral common femoral vein are unremarkable. OTHER None. Limitations: none IMPRESSION: No deep venous thrombosis of the right lower extremity. Electronically Signed   By: Iven Finn M.D.   On: 05/13/2022 17:00    Procedures Procedures    Medications Ordered in ED Medications - No data to display  ED Course/ Medical Decision Making/ A&P Clinical Course as of 05/13/22 1706  Sat May 13, 2022  1706 No acute DVT.  She is ready to leave.  Okay for discharge with orthopedic follow-up [MT]    Clinical Course User Index [MT] Loraine Bhullar, Carola Rhine, MD  Medical Decision Making  Patient is here with right leg or knee pain.  Differential would include DVT versus Baker's cyst versus meniscus injury versus other musculoskeletal injury.  I do not see evidence of cellulitis or infection.  Doubt necrotizing fasciitis.  I do not see an indication for emergent x-ray imaging at this time as this mechanism is not consistent with a fracture.  I doubt septic joint.  DVT ultrasound ordered and personally viewed interpreted, showing no acute DVT  Patient has follow-up arranged with orthopedics next week appropriately.  This may be a meniscus injury per her description of popping.  She is however able to bear weight on the leg at the time and walk steadily.        Final Clinical Impression(s) / ED Diagnoses Final diagnoses:  Acute pain of right knee    Rx / DC Orders ED Discharge Orders     None         Benaiah Behan, Carola Rhine, MD 05/13/22 [id number]

## 2022-05-13 NOTE — Discharge Instructions (Addendum)
Please follow-up with the orthopedic or sports medicine doctor this week as scheduled.  Your DVT ultrasound did not show any signs of blood clot in the right leg today.

## 2022-05-13 NOTE — ED Triage Notes (Signed)
Pt states trip late August to [place], drove and was passenger.  Pt endorses painful and "popping" in right leg. Pt endorses pain when bending leg, and worsening with walking. Pt states numbness with walking.   Pt states pain is mostly behind right knee and calf.   No bruising, redness, and heat.

## 2022-05-15 ENCOUNTER — Ambulatory Visit (INDEPENDENT_AMBULATORY_CARE_PROVIDER_SITE_OTHER): Payer: BC Managed Care – PPO | Admitting: Family Medicine

## 2022-05-15 DIAGNOSIS — R29898 Other symptoms and signs involving the musculoskeletal system: Secondary | ICD-10-CM | POA: Diagnosis not present

## 2022-05-15 DIAGNOSIS — F331 Major depressive disorder, recurrent, moderate: Secondary | ICD-10-CM | POA: Diagnosis not present

## 2022-05-15 DIAGNOSIS — I890 Lymphedema, not elsewhere classified: Secondary | ICD-10-CM | POA: Diagnosis not present

## 2022-05-15 DIAGNOSIS — M79604 Pain in right leg: Secondary | ICD-10-CM | POA: Diagnosis not present

## 2022-05-15 DIAGNOSIS — F411 Generalized anxiety disorder: Secondary | ICD-10-CM | POA: Diagnosis not present

## 2022-05-15 DIAGNOSIS — M79605 Pain in left leg: Secondary | ICD-10-CM | POA: Diagnosis not present

## 2022-05-17 DIAGNOSIS — M1711 Unilateral primary osteoarthritis, right knee: Secondary | ICD-10-CM | POA: Diagnosis not present

## 2022-05-17 DIAGNOSIS — R52 Pain, unspecified: Secondary | ICD-10-CM | POA: Diagnosis not present

## 2022-05-17 DIAGNOSIS — M25561 Pain in right knee: Secondary | ICD-10-CM | POA: Diagnosis not present

## 2022-05-29 DIAGNOSIS — I890 Lymphedema, not elsewhere classified: Secondary | ICD-10-CM | POA: Diagnosis not present

## 2022-06-14 DIAGNOSIS — F331 Major depressive disorder, recurrent, moderate: Secondary | ICD-10-CM | POA: Diagnosis not present

## 2022-06-14 DIAGNOSIS — F411 Generalized anxiety disorder: Secondary | ICD-10-CM | POA: Diagnosis not present

## 2022-06-19 DIAGNOSIS — F331 Major depressive disorder, recurrent, moderate: Secondary | ICD-10-CM | POA: Diagnosis not present

## 2022-06-19 DIAGNOSIS — Z0000 Encounter for general adult medical examination without abnormal findings: Secondary | ICD-10-CM | POA: Diagnosis not present

## 2022-06-19 DIAGNOSIS — G4733 Obstructive sleep apnea (adult) (pediatric): Secondary | ICD-10-CM | POA: Diagnosis not present

## 2022-06-19 DIAGNOSIS — R350 Frequency of micturition: Secondary | ICD-10-CM | POA: Diagnosis not present

## 2022-06-19 DIAGNOSIS — Z6841 Body Mass Index (BMI) 40.0 and over, adult: Secondary | ICD-10-CM | POA: Diagnosis not present

## 2022-06-19 DIAGNOSIS — F411 Generalized anxiety disorder: Secondary | ICD-10-CM | POA: Diagnosis not present

## 2022-06-22 ENCOUNTER — Other Ambulatory Visit (INDEPENDENT_AMBULATORY_CARE_PROVIDER_SITE_OTHER): Payer: Self-pay | Admitting: Family Medicine

## 2022-06-22 DIAGNOSIS — E559 Vitamin D deficiency, unspecified: Secondary | ICD-10-CM

## 2022-06-22 DIAGNOSIS — F3289 Other specified depressive episodes: Secondary | ICD-10-CM

## 2022-07-13 DIAGNOSIS — I890 Lymphedema, not elsewhere classified: Secondary | ICD-10-CM | POA: Diagnosis not present

## 2022-07-17 DIAGNOSIS — F331 Major depressive disorder, recurrent, moderate: Secondary | ICD-10-CM | POA: Diagnosis not present

## 2022-07-17 DIAGNOSIS — F411 Generalized anxiety disorder: Secondary | ICD-10-CM | POA: Diagnosis not present

## 2022-07-18 DIAGNOSIS — F411 Generalized anxiety disorder: Secondary | ICD-10-CM | POA: Diagnosis not present

## 2022-07-18 DIAGNOSIS — Z79899 Other long term (current) drug therapy: Secondary | ICD-10-CM | POA: Diagnosis not present

## 2022-07-18 DIAGNOSIS — F331 Major depressive disorder, recurrent, moderate: Secondary | ICD-10-CM | POA: Diagnosis not present

## 2022-07-25 DIAGNOSIS — Z6841 Body Mass Index (BMI) 40.0 and over, adult: Secondary | ICD-10-CM | POA: Diagnosis not present

## 2022-07-25 DIAGNOSIS — G4710 Hypersomnia, unspecified: Secondary | ICD-10-CM | POA: Diagnosis not present

## 2022-08-03 DIAGNOSIS — I890 Lymphedema, not elsewhere classified: Secondary | ICD-10-CM | POA: Diagnosis not present

## 2022-08-11 DIAGNOSIS — R194 Change in bowel habit: Secondary | ICD-10-CM | POA: Diagnosis not present

## 2022-08-11 DIAGNOSIS — K589 Irritable bowel syndrome without diarrhea: Secondary | ICD-10-CM | POA: Diagnosis not present

## 2022-08-11 DIAGNOSIS — Z6841 Body Mass Index (BMI) 40.0 and over, adult: Secondary | ICD-10-CM | POA: Diagnosis not present

## 2022-08-16 DIAGNOSIS — I890 Lymphedema, not elsewhere classified: Secondary | ICD-10-CM | POA: Diagnosis not present

## 2022-08-24 DIAGNOSIS — G4733 Obstructive sleep apnea (adult) (pediatric): Secondary | ICD-10-CM | POA: Diagnosis not present

## 2022-11-05 ENCOUNTER — Other Ambulatory Visit (INDEPENDENT_AMBULATORY_CARE_PROVIDER_SITE_OTHER): Payer: Self-pay | Admitting: Family Medicine

## 2022-11-05 DIAGNOSIS — F3289 Other specified depressive episodes: Secondary | ICD-10-CM

## 2022-11-13 ENCOUNTER — Other Ambulatory Visit (INDEPENDENT_AMBULATORY_CARE_PROVIDER_SITE_OTHER): Payer: Self-pay | Admitting: Family Medicine

## 2022-11-13 DIAGNOSIS — F3289 Other specified depressive episodes: Secondary | ICD-10-CM

## 2023-02-06 ENCOUNTER — Other Ambulatory Visit: Payer: Self-pay

## 2023-02-06 ENCOUNTER — Encounter: Payer: Self-pay | Admitting: *Deleted

## 2023-02-06 ENCOUNTER — Emergency Department
Admission: EM | Admit: 2023-02-06 | Discharge: 2023-02-06 | Disposition: A | Discharge status: Home / Self Care | Payer: BC Managed Care – PPO | Attending: Emergency Medicine | Admitting: Emergency Medicine

## 2023-02-06 DIAGNOSIS — Z20822 Contact with and (suspected) exposure to covid-19: Secondary | ICD-10-CM | POA: Diagnosis not present

## 2023-02-06 DIAGNOSIS — I10 Essential (primary) hypertension: Secondary | ICD-10-CM | POA: Diagnosis not present

## 2023-02-06 DIAGNOSIS — B349 Viral infection, unspecified: Secondary | ICD-10-CM | POA: Diagnosis not present

## 2023-02-06 DIAGNOSIS — R509 Fever, unspecified: Secondary | ICD-10-CM | POA: Diagnosis present

## 2023-02-06 LAB — GROUP A STREP BY PCR: Group A Strep by PCR: NOT DETECTED

## 2023-02-06 LAB — SARS CORONAVIRUS 2 BY RT PCR: SARS Coronavirus 2 by RT PCR: NEGATIVE

## 2023-02-06 NOTE — ED Provider Notes (Signed)
[facility] Provider Note    None    (approximate)   History   Fever   HPI  AALAIYAH NEISS is a 32 y.o. female with history of hypertension, migraine, hyperlipidemia, asthma and as listed in EMR presents to the emergency department for treatment and evaluation of fever for the past couple days and scratchy throat.  Temperature has been as high as 100.  No alleviating measures attempted.Marland Kitchen      Physical Exam   Triage Vital Signs: ED Triage Vitals  Enc Vitals Group     BP 02/06/23 1912 (!) 140/76     Pulse Rate 02/06/23 1912 91     Resp 02/06/23 1912 18     Temp 02/06/23 1912 98.7 F (37.1 C)     Temp Source 02/06/23 1912 Oral     SpO2 02/06/23 1912 96 %     Weight 02/06/23 1910 (!) 460 lb (208.7 kg)     Height 02/06/23 1910 5\' 5"  (1.651 m)     Head Circumference --      Peak Flow --      Pain Score 02/06/23 1910 10     Pain Loc --      Pain Edu? --      Excl. in GC? --     Most recent vital signs: Vitals:   02/06/23 1912  BP: (!) 140/76  Pulse: 91  Resp: 18  Temp: 98.7 F (37.1 C)  SpO2: 96%    General: Awake, no distress.  CV:  Good peripheral perfusion.  Resp:  Normal effort.  Breath sounds are clear to auscultation. Abd:  No distention.  Other:  No exudate noted in the posterior oropharynx or tonsils.  No palpable cervical lymph nodes.   ED Results / Procedures / Treatments   Labs (all labs ordered are listed, but only abnormal results are displayed) Labs Reviewed  GROUP A STREP BY PCR  SARS CORONAVIRUS 2 BY RT PCR     EKG  Not indicated.   RADIOLOGY  Image and radiology report reviewed and interpreted by me. Radiology report consistent with the same.  Not indicated.  PROCEDURES:  Critical Care performed: No  Procedures   MEDICATIONS ORDERED IN ED:  Medications - No data to display   IMPRESSION / MDM / ASSESSMENT AND PLAN / ED COURSE   I have reviewed the triage note.  Differential diagnosis  includes, but is not limited to, viral syndrome, strep throat, COVID  Patient's presentation is most consistent with acute complicated illness / injury requiring diagnostic workup.  32 year old female presenting to the emergency department for treatment and evaluation of fever up to 100 degrees for the past 2 days with scratchy throat.  She denies cough, nausea, vomiting, diarrhea.  Strep screen and COVID are both negative today.  Results discussed with the patient.  She was encouraged to take Tylenol, ibuprofen, or Naprosyn for pain and or fever.  If she is not feeling better over the next few days she is to follow-up with her primary care provider.  If her symptoms change or worsen and she is unable to schedule an appointment, she is to return to the emergency department.      FINAL CLINICAL IMPRESSION(S) / ED DIAGNOSES   Final diagnoses:  Acute viral syndrome     Rx / DC Orders   ED Discharge Orders     None        Note:  This document was prepared using Dragon  voice recognition software and may include unintentional dictation errors.   Chinita Pester, FNP 02/06/23 2158    Jene Every, MD 02/06/23 2208

## 2023-02-06 NOTE — Discharge Instructions (Signed)
Tylenol or Naprosyn for pain or fever.  Follow up with primary care if not improving over the next few days.

## 2023-02-06 NOTE — ED Triage Notes (Signed)
Pt reports fever for 2 days.  Pt also has a sore throat, cough .pt alert

## 2023-03-28 ENCOUNTER — Emergency Department: Payer: BC Managed Care – PPO

## 2023-03-28 ENCOUNTER — Other Ambulatory Visit: Payer: Self-pay

## 2023-03-28 ENCOUNTER — Emergency Department
Admission: EM | Admit: 2023-03-28 | Discharge: 2023-03-29 | Disposition: A | Discharge status: Home / Self Care | Payer: BC Managed Care – PPO | Attending: Student in an Organized Health Care Education/Training Program | Admitting: Student in an Organized Health Care Education/Training Program

## 2023-03-28 DIAGNOSIS — N939 Abnormal uterine and vaginal bleeding, unspecified: Secondary | ICD-10-CM | POA: Insufficient documentation

## 2023-03-28 DIAGNOSIS — R5381 Other malaise: Secondary | ICD-10-CM | POA: Diagnosis not present

## 2023-03-28 DIAGNOSIS — R1031 Right lower quadrant pain: Secondary | ICD-10-CM

## 2023-03-28 LAB — URINALYSIS, ROUTINE W REFLEX MICROSCOPIC
Bilirubin Urine: NEGATIVE
Glucose, UA: NEGATIVE mg/dL
Ketones, ur: NEGATIVE mg/dL
Nitrite: NEGATIVE
Protein, ur: 100 mg/dL — AB
RBC / HPF: >50 RBC/hpf (ref 0–5)
Specific Gravity, Urine: 1.028 (ref 1.005–1.030)
Squamous Epithelial / HPF: NONE SEEN /HPF (ref 0–5)
WBC, UA: >50 WBC/hpf (ref 0–5)
pH: 6 (ref 5.0–8.0)

## 2023-03-28 LAB — COMPREHENSIVE METABOLIC PANEL
ALT: 32 U/L (ref 0–44)
AST: 29 U/L (ref 15–41)
Albumin: 3.9 g/dL (ref 3.5–5.0)
Alkaline Phosphatase: 40 U/L (ref 38–126)
Anion gap: 9 (ref 5–15)
BUN: 15 mg/dL (ref 6–20)
CO2: 23 mmol/L (ref 22–32)
Calcium: 8.8 mg/dL — ABNORMAL LOW (ref 8.9–10.3)
Chloride: 102 mmol/L (ref 98–111)
Creatinine, Ser: 0.78 mg/dL (ref 0.44–1.00)
GFR, Estimated: >60 mL/min (ref >60)
Glucose, Bld: 110 mg/dL — ABNORMAL HIGH (ref 70–99)
Potassium: 3.9 mmol/L (ref 3.5–5.1)
Sodium: 134 mmol/L — ABNORMAL LOW (ref 135–145)
Total Bilirubin: 0.4 mg/dL (ref 0.3–1.2)
Total Protein: 7.9 g/dL (ref 6.5–8.1)

## 2023-03-28 LAB — TYPE AND SCREEN
ABO/RH(D): B POS
Antibody Screen: NEGATIVE

## 2023-03-28 LAB — LIPASE, BLOOD: Lipase: 42 U/L (ref 11–51)

## 2023-03-28 LAB — CBC
HCT: 40 % (ref 36.0–46.0)
Hemoglobin: 12.8 g/dL (ref 12.0–15.0)
MCH: 27.2 pg (ref 26.0–34.0)
MCHC: 32 g/dL (ref 30.0–36.0)
MCV: 85.1 fL (ref 80.0–100.0)
Platelets: 296 10*3/uL (ref 150–400)
RBC: 4.7 MIL/uL (ref 3.87–5.11)
RDW: 13.6 % (ref 11.5–15.5)
WBC: 8.8 10*3/uL (ref 4.0–10.5)
nRBC: 0 % (ref 0.0–0.2)

## 2023-03-28 LAB — POC URINE PREG, ED: Preg Test, Ur: NEGATIVE

## 2023-03-28 MED ORDER — IOHEXOL 350 MG/ML SOLN
125.0000 mL | Freq: Once | INTRAVENOUS | Status: AC | PRN
  Administered 2023-03-28: 125 mL via INTRAVENOUS

## 2023-03-28 MED ORDER — KETOROLAC TROMETHAMINE 30 MG/ML IJ SOLN
15.0000 mg | Freq: Once | INTRAMUSCULAR | Status: AC
  Administered 2023-03-29: 15 mg via INTRAVENOUS
  Filled 2023-03-28: qty 1

## 2023-03-28 NOTE — ED Triage Notes (Signed)
Pt presents to ER with c/o RLQ abd pain that started around 1300 today at work.  Pt also reports increases in vaginal bleeding.  Pt states vaginal bleeding has been going on for appx 8 months, but has become worse in the last week.  Pt states she has been put on different meds for vaginal bleeding without any change.  Pt states pain in her RLQ radiates up into RUQ.  Pt denies any n/v but has had a small amt of diarrhea.  Pt denies pain w/urination, but states it feels "warmer down there."  Pt otherwise A&O x4 and in NAD in triage.

## 2023-03-28 NOTE — ED Provider Notes (Signed)
[facility] Provider Note    Event Date/Time   First MD Initiated Contact with Patient 03/28/23 2125     (approximate)   History   Abdominal Pain and Vaginal Bleeding   HPI  Brandy Saunders is a 32 y.o. female history of PCOS presents to the ER for evaluation of few days of increasing right lower quadrant abdominal pain associated with some malaise.  Denies any dysuria.  Does have some vaginal bleeding which she has had in the past.  Denies any measured fevers or temperature.     Physical Exam   Triage Vital Signs: ED Triage Vitals  Encounter Vitals Group     BP 03/28/23 1939 124/74     Systolic BP Percentile --      Diastolic BP Percentile --      Pulse Rate 03/28/23 1939 97     Resp 03/28/23 1939 (!) 22     Temp 03/28/23 1939 98.5 F (36.9 C)     Temp Source 03/28/23 1939 Oral     SpO2 03/28/23 1939 97 %     Weight --      Height --      Head Circumference --      Peak Flow --      Pain Score 03/28/23 1943 7     Pain Loc --      Pain Education --      Exclude from Growth Chart --     Most recent vital signs: Vitals:   03/28/23 1939  BP: 124/74  Pulse: 97  Resp: (!) 22  Temp: 98.5 F (36.9 C)  SpO2: 97%     Constitutional: Alert  Eyes: Conjunctivae are normal.  Head: Atraumatic. Nose: No congestion/rhinnorhea. Mouth/Throat: Mucous membranes are moist.   Neck: Painless ROM.  Cardiovascular:   Good peripheral circulation. Respiratory: Normal respiratory effort.  No retractions.  Gastrointestinal: Soft no guarding or rebound but somewhat limited exam due to body habitus. Musculoskeletal:  no deformity Neurologic:  MAE spontaneously. No gross focal neurologic deficits are appreciated.  Skin:  Skin is warm, dry and intact. No rash noted. Psychiatric: Mood and affect are normal. Speech and behavior are normal.    ED Results / Procedures / Treatments   Labs (all labs ordered are listed, but only abnormal results are  displayed) Labs Reviewed  URINALYSIS, ROUTINE W REFLEX MICROSCOPIC - Abnormal; Notable for the following components:      Result Value   Color, Urine AMBER (*)    APPearance CLOUDY (*)    Hgb urine dipstick LARGE (*)    Protein, ur 100 (*)    Leukocytes,Ua MODERATE (*)    Bacteria, UA FEW (*)    All other components within normal limits  COMPREHENSIVE METABOLIC PANEL - Abnormal; Notable for the following components:   Sodium 134 (*)    Glucose, Bld 110 (*)    Calcium 8.8 (*)    All other components within normal limits  CBC  LIPASE, BLOOD  POC URINE PREG, ED  TYPE AND SCREEN     EKG     RADIOLOGY Please see ED Course for my review and interpretation.  I personally reviewed all radiographic images ordered to evaluate for the above acute complaints and reviewed radiology reports and findings.  These findings were personally discussed with the patient.  Please see medical record for radiology report.    PROCEDURES:  Critical Care performed:   Procedures   MEDICATIONS ORDERED IN ED: Medications  ketorolac (TORADOL) 30 MG/ML injection 15 mg (has no administration in time range)  iohexol (OMNIPAQUE) 350 MG/ML injection 125 mL (125 mLs Intravenous Contrast Given 03/28/23 2206)     IMPRESSION / MDM / ASSESSMENT AND PLAN / ED COURSE  I reviewed the triage vital signs and the nursing notes.                              Differential diagnosis includes, but is not limited to, appendicitis, colitis, MSK strain, stone, UTI, PID, cyst, torsion  Patient presenting to the ER for evaluation of symptoms as described above.  Based on symptoms, risk factors and considered above differential, this presenting complaint could reflect a potentially life-threatening illness therefore the patient will be placed on continuous pulse oximetry and telemetry for monitoring.  Laboratory evaluation will be sent to evaluate for the above complaints.      Clinical Course as of 03/28/23 2339   Wed Mar 28, 2023  2230 CT imaging on my review and interpretation without evidence of perforation or significant intra-abdominal process.  Will await formal radiology report. [PR]  2339 Repeat abdominal exam reassuring.  Patient in no acute distress.  Will give Toradol for discomfort.  CT imaging without acute abnormality.  No adnexal findings.  Patient denying any symptoms of dysuria or UTI.  She does appear stable and appropriate for outpatient follow-up with OB/GYN. [PR]    Clinical Course User Index [PR] Willy Eddy, MD     FINAL CLINICAL IMPRESSION(S) / ED DIAGNOSES   Final diagnoses:  Vaginal bleeding  Right lower quadrant abdominal pain     Rx / DC Orders   ED Discharge Orders     None        Note:  This document was prepared using Dragon voice recognition software and may include unintentional dictation errors.    Willy Eddy, MD 03/28/23 [id number]

## 2023-03-29 NOTE — ED Notes (Signed)
Pt A&O x4, no obvious distress noted, respirations regular/unlabored. Pt verbalizes understanding of discharge instructions. Pt able to ambulate from ED independently.   

## 2023-07-15 IMAGING — CT CT RENAL STONE PROTOCOL
2 of 4 series · 17 of 46 positions shown, 19 images · non-contrast
Comparison: None.

CLINICAL DATA: Flank pain

EXAM:
CT ABDOMEN AND PELVIS WITHOUT CONTRAST
TECHNIQUE: Multidetector CT imaging of the abdomen and pelvis was performed
following the standard protocol without IV contrast.

[Series 2: stone full · axial · 0.98mm/px · z∈[+738,+1148]mm · 14 of 90 slices shown, 16 images]
[im 4/90  soft-tissue]
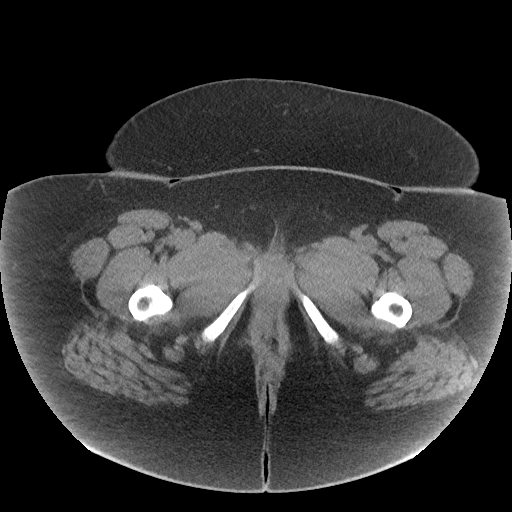
[im 4/90  bone]
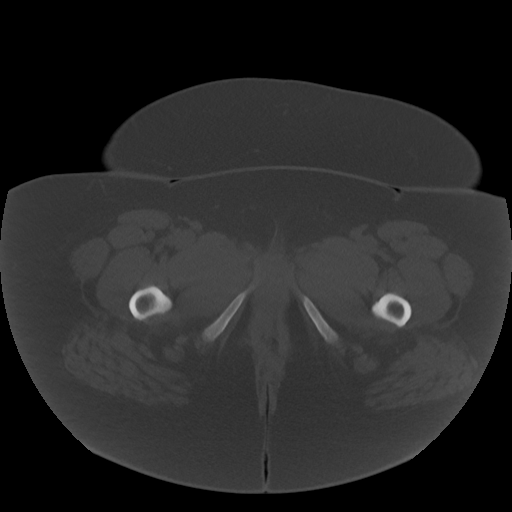
[im 12/90  soft-tissue]
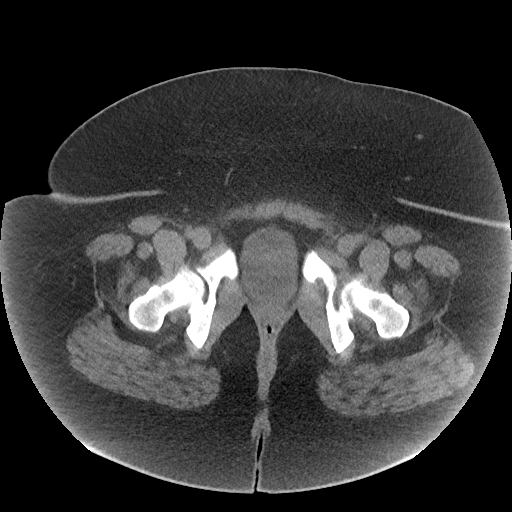
[im 19/90  soft-tissue]
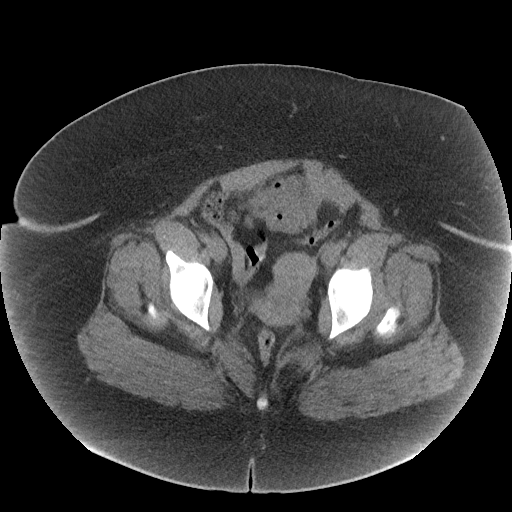
[im 23/90  soft-tissue]
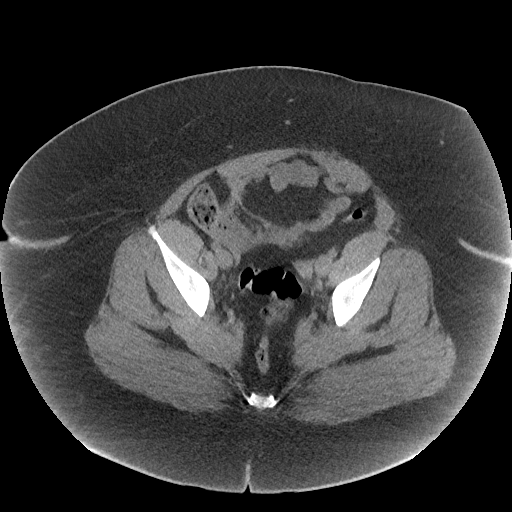
[im 30/90  soft-tissue]
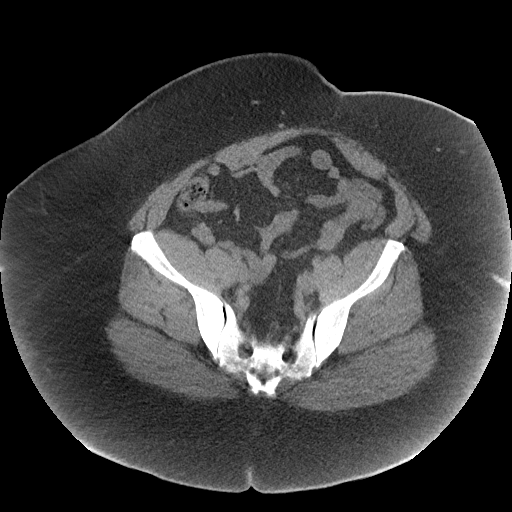
[im 38/90  soft-tissue]
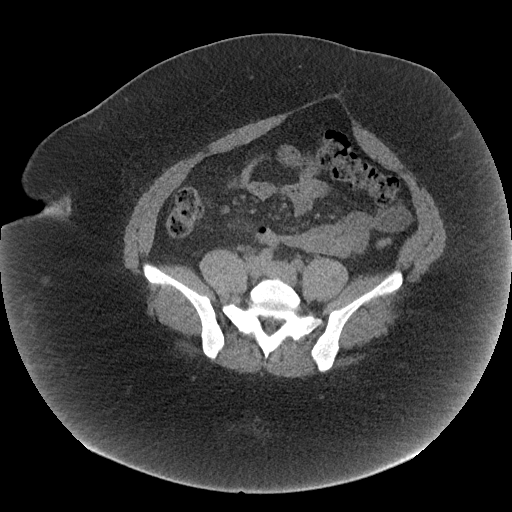
[im 41/90  soft-tissue]
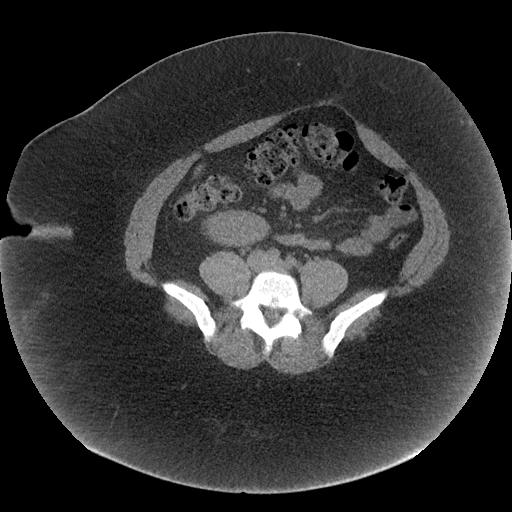
[im 49/90  soft-tissue]
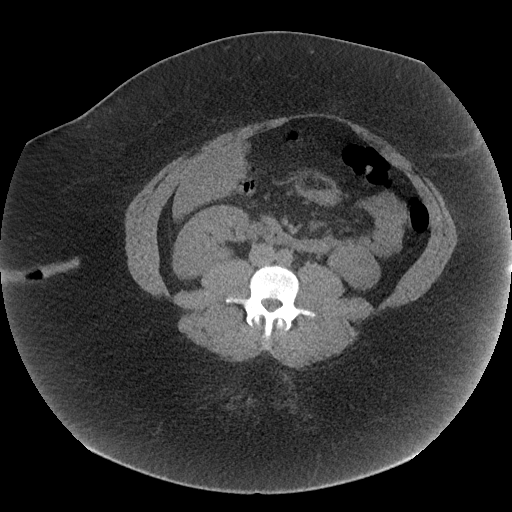
[im 52/90  soft-tissue]
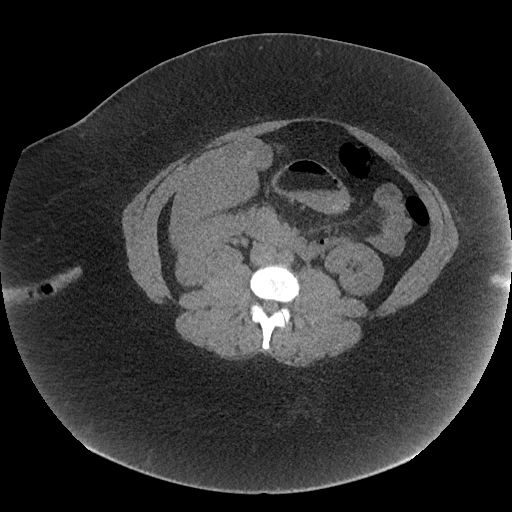
[im 52/90  bone]
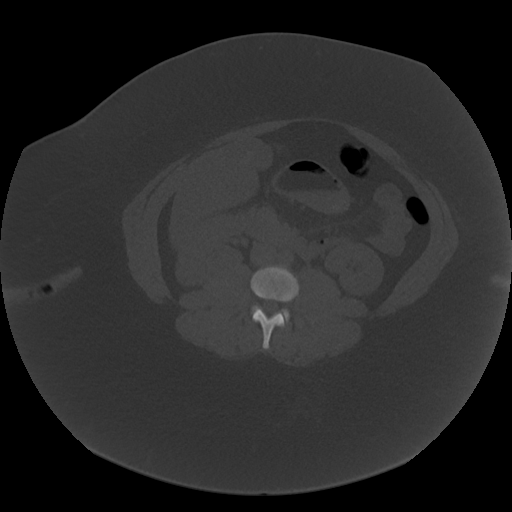
[im 60/90  soft-tissue]
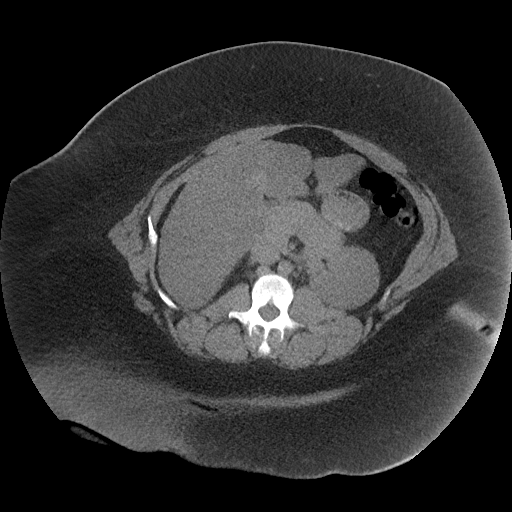
[im 67/90  soft-tissue]
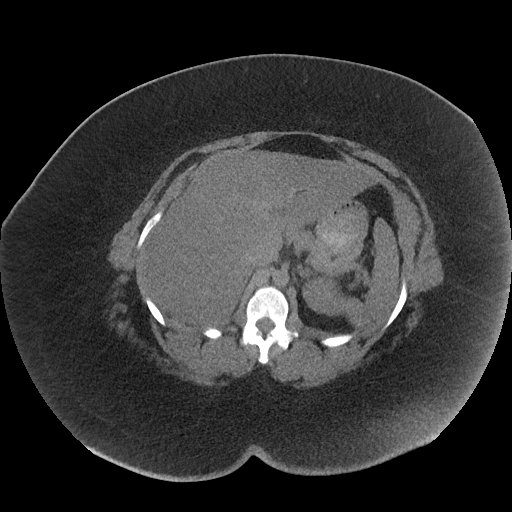
[im 71/90  soft-tissue]
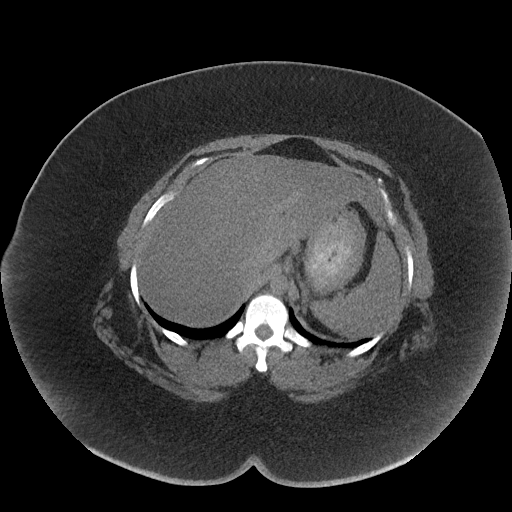
[im 78/90  soft-tissue]
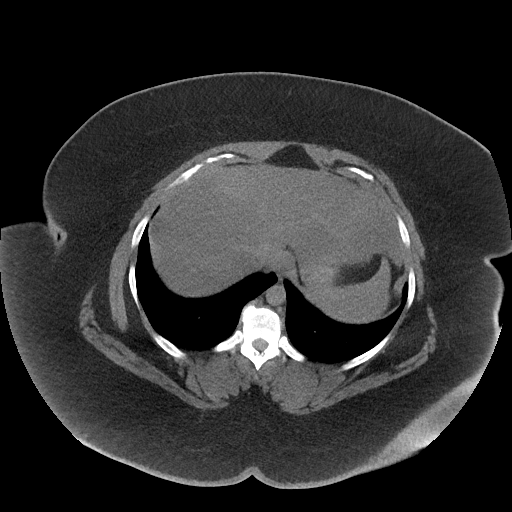
[im 86/90  soft-tissue]
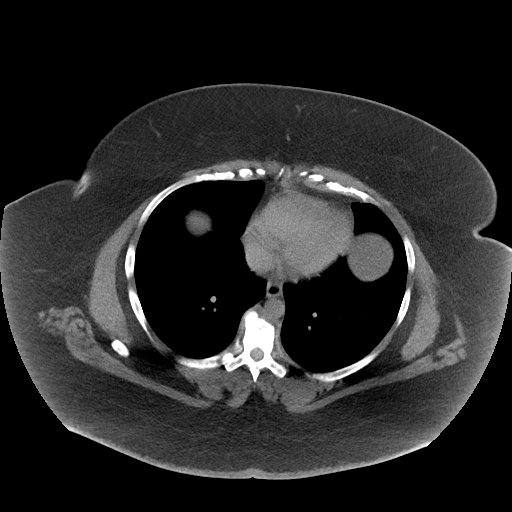

[Series 5: coronal · coronal · 0.79mm/px · 3 of 152 slices shown]
[im 51/152  soft-tissue]
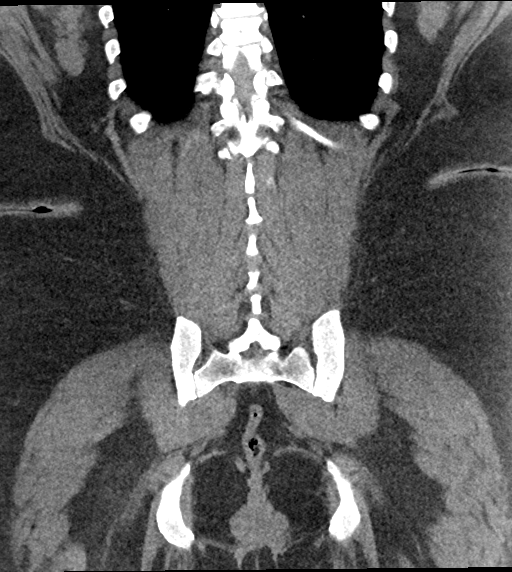
[im 68/152  soft-tissue]
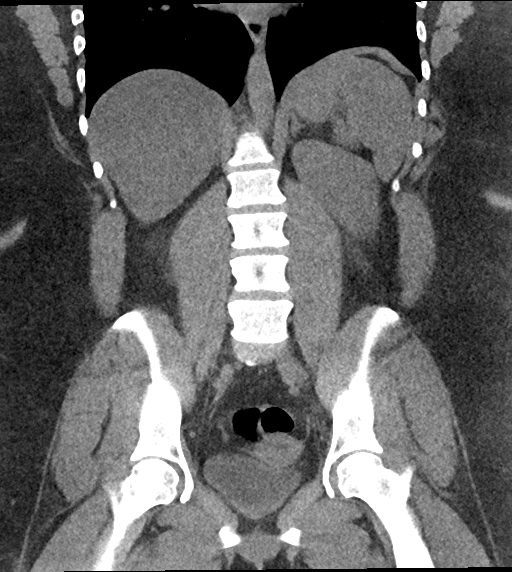
[im 84/152  soft-tissue]
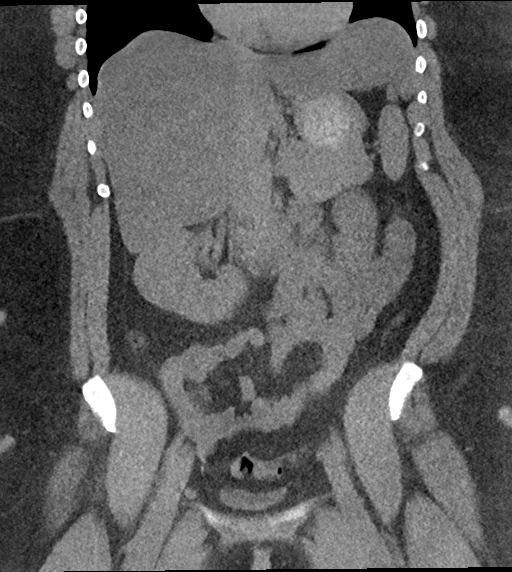

[17 of 46 positions shown; findings below may reference images not displayed]

FINDINGS: Lower chest: No acute abnormality.

Hepatobiliary: Diffuse low attenuation of the liver as can be seen
with hepatic steatosis. No focal hepatic mass. Hepatomegaly. No
cholelithiasis, gallbladder wall thickening or biliary ductal
dilatation.

Pancreas: Unremarkable. No pancreatic ductal dilatation or
surrounding inflammatory changes.

Spleen: Normal in size without focal abnormality.

Adrenals/Urinary Tract: Adrenal glands are unremarkable. Kidneys are
normal, without renal calculi, focal lesion, or hydronephrosis.
Bladder is unremarkable.

Stomach/Bowel: Stomach is within normal limits. Appendix appears
normal. No evidence of bowel wall thickening, distention, or
inflammatory changes.

Vascular/Lymphatic: No significant vascular findings are present. No
enlarged abdominal or pelvic lymph nodes.

Reproductive: Uterus and bilateral adnexa are unremarkable.

Other: Fat containing umbilical hernia.  No abdominopelvic ascites.

Musculoskeletal: No acute osseous abnormality. No aggressive osseous
lesion.
IMPRESSION: 1. No acute abdominal or pelvic pathology.
2. No urolithiasis or obstructive uropathy.
3. Hepatic steatosis.
